# Patient Record
Sex: Female | Born: 1935
Health system: Southern US, Community
[De-identification: ages and names within clinical notes are randomized; demographics above are authoritative.]

## PROBLEM LIST (undated history)

## (undated) DIAGNOSIS — M109 Gout, unspecified: Secondary | ICD-10-CM

## (undated) DIAGNOSIS — N189 Chronic kidney disease, unspecified: Secondary | ICD-10-CM

## (undated) DIAGNOSIS — I639 Cerebral infarction, unspecified: Secondary | ICD-10-CM

## (undated) DIAGNOSIS — I700 Atherosclerosis of aorta: Secondary | ICD-10-CM

## (undated) DIAGNOSIS — M1991 Primary osteoarthritis, unspecified site: Secondary | ICD-10-CM

## (undated) DIAGNOSIS — I739 Peripheral vascular disease, unspecified: Secondary | ICD-10-CM

## (undated) DIAGNOSIS — K5792 Diverticulitis of intestine, part unspecified, without perforation or abscess without bleeding: Secondary | ICD-10-CM

## (undated) DIAGNOSIS — E119 Type 2 diabetes mellitus without complications: Secondary | ICD-10-CM

## (undated) DIAGNOSIS — K219 Gastro-esophageal reflux disease without esophagitis: Secondary | ICD-10-CM

## (undated) DIAGNOSIS — I10 Essential (primary) hypertension: Secondary | ICD-10-CM

## (undated) DIAGNOSIS — I6529 Occlusion and stenosis of unspecified carotid artery: Secondary | ICD-10-CM

## (undated) DIAGNOSIS — E039 Hypothyroidism, unspecified: Secondary | ICD-10-CM

## (undated) DIAGNOSIS — E785 Hyperlipidemia, unspecified: Secondary | ICD-10-CM

## (undated) DIAGNOSIS — I2510 Atherosclerotic heart disease of native coronary artery without angina pectoris: Secondary | ICD-10-CM

## (undated) HISTORY — DX: Gout, unspecified: M10.9

## (undated) HISTORY — DX: Hypothyroidism, unspecified: E03.9

## (undated) HISTORY — DX: Atherosclerosis of aorta: I70.0

## (undated) HISTORY — DX: Peripheral vascular disease, unspecified: I73.9

## (undated) HISTORY — DX: Primary osteoarthritis, unspecified site: M19.91

## (undated) HISTORY — PX: CATARACT EXTRACTION: SUR2

## (undated) HISTORY — DX: Occlusion and stenosis of unspecified carotid artery: I65.29

## (undated) HISTORY — DX: Atherosclerotic heart disease of native coronary artery without angina pectoris: I25.10

## (undated) HISTORY — DX: Type 2 diabetes mellitus without complications: E11.9

## (undated) HISTORY — DX: Cerebral infarction, unspecified: I63.9

## (undated) HISTORY — DX: Hyperlipidemia, unspecified: E78.5

## (undated) HISTORY — DX: Essential (primary) hypertension: I10

## (undated) HISTORY — PX: OTHER SURGICAL HISTORY: SHX169

## (undated) HISTORY — PX: COLONOSCOPY: SHX174

---

## 2004-08-18 ENCOUNTER — Ambulatory Visit: Payer: Self-pay | Admitting: Cardiovascular Disease

## 2004-08-22 ENCOUNTER — Ambulatory Visit: Payer: Self-pay | Admitting: Cardiovascular Disease

## 2004-09-06 ENCOUNTER — Ambulatory Visit: Payer: Self-pay | Admitting: Family Medicine

## 2004-09-21 ENCOUNTER — Inpatient Hospital Stay: Payer: Self-pay | Admitting: Internal Medicine

## 2004-12-04 ENCOUNTER — Ambulatory Visit: Payer: Self-pay | Admitting: Internal Medicine

## 2004-12-04 ENCOUNTER — Inpatient Hospital Stay: Payer: Self-pay | Admitting: Internal Medicine

## 2004-12-04 ENCOUNTER — Other Ambulatory Visit: Payer: Self-pay

## 2005-01-08 ENCOUNTER — Inpatient Hospital Stay: Payer: Self-pay | Admitting: Internal Medicine

## 2005-01-08 ENCOUNTER — Other Ambulatory Visit: Payer: Self-pay

## 2005-02-01 ENCOUNTER — Ambulatory Visit: Payer: Self-pay | Admitting: Oncology

## 2005-10-30 ENCOUNTER — Ambulatory Visit: Payer: Self-pay | Admitting: Oncology

## 2005-10-31 ENCOUNTER — Encounter (HOSPITAL_COMMUNITY): Admission: RE | Admit: 2005-10-31 | Discharge: 2006-01-29 | Payer: Self-pay | Admitting: Oncology

## 2005-10-31 LAB — CBC WITH DIFFERENTIAL (CANCER CENTER ONLY)
BASO#: 0 10*3/uL (ref 0.0–0.2)
BASO%: 0.7 % (ref 0.0–2.0)
EOS%: 3.7 % (ref 0.0–7.0)
Eosinophils Absolute: 0.2 10*3/uL (ref 0.0–0.5)
MCH: 32 pg (ref 26.0–34.0)
MCV: 95 fL (ref 81–101)
NEUT%: 39.5 % — ABNORMAL LOW (ref 39.6–80.0)
Platelets: 203 10*3/uL (ref 145–400)
RDW: 12.4 % (ref 10.5–14.6)

## 2005-10-31 LAB — MORPHOLOGY - CHCC SATELLITE

## 2005-10-31 LAB — CHCC SATELLITE - SMEAR

## 2005-11-01 LAB — FERRITIN: Ferritin: 80 ng/mL (ref 10–291)

## 2005-11-01 LAB — COMPREHENSIVE METABOLIC PANEL
ALT: 18 U/L (ref 0–40)
Albumin: 3.5 g/dL (ref 3.5–5.2)
Alkaline Phosphatase: 65 U/L (ref 39–117)
CO2: 25 mEq/L (ref 19–32)
Potassium: 4.2 mEq/L (ref 3.5–5.3)
Sodium: 140 mEq/L (ref 135–145)
Total Bilirubin: 0.6 mg/dL (ref 0.3–1.2)
Total Protein: 6.2 g/dL (ref 6.0–8.3)

## 2005-11-01 LAB — RETICULOCYTES (CHCC)
ABS Retic: 71.3 10*3/uL (ref 19.0–186.0)
Retic Ct Pct: 2.7 % (ref 0.4–3.1)

## 2005-11-01 LAB — IRON AND TIBC: %SAT: 86 % — ABNORMAL HIGH (ref 20–55)

## 2005-11-01 LAB — ERYTHROPOIETIN: Erythropoietin: 95.2 m[IU]/mL — ABNORMAL HIGH (ref 2.6–34.0)

## 2005-11-01 LAB — VITAMIN B12: Vitamin B-12: 1391 pg/mL — ABNORMAL HIGH (ref 211–911)

## 2005-11-05 LAB — TYPE & CROSSMATCH - CHCC SATELLITE

## 2005-11-12 ENCOUNTER — Ambulatory Visit: Payer: Self-pay | Admitting: Gastroenterology

## 2005-11-21 LAB — CBC WITH DIFFERENTIAL (CANCER CENTER ONLY)
BASO#: 0 10*3/uL (ref 0.0–0.2)
Eosinophils Absolute: 0.2 10*3/uL (ref 0.0–0.5)
HCT: 35.5 % (ref 34.8–46.6)
HGB: 11.9 g/dL (ref 11.6–15.9)
LYMPH#: 1.5 10*3/uL (ref 0.9–3.3)
MCH: 31.6 pg (ref 26.0–34.0)
MCHC: 33.6 g/dL (ref 32.0–36.0)
NEUT#: 1.6 10*3/uL (ref 1.5–6.5)
NEUT%: 44.7 % (ref 39.6–80.0)
RBC: 3.78 10*6/uL (ref 3.70–5.32)

## 2005-12-19 ENCOUNTER — Ambulatory Visit: Payer: Self-pay | Admitting: Oncology

## 2005-12-20 LAB — CBC WITH DIFFERENTIAL (CANCER CENTER ONLY)
BASO#: 0 10*3/uL (ref 0.0–0.2)
Eosinophils Absolute: 0.1 10*3/uL (ref 0.0–0.5)
HGB: 11.2 g/dL — ABNORMAL LOW (ref 11.6–15.9)
LYMPH%: 36.8 % (ref 14.0–48.0)
MCH: 30.9 pg (ref 26.0–34.0)
MCV: 93 fL (ref 81–101)
MONO%: 14.7 % — ABNORMAL HIGH (ref 0.0–13.0)
NEUT%: 44.6 % (ref 39.6–80.0)
RBC: 3.62 10*6/uL — ABNORMAL LOW (ref 3.70–5.32)

## 2006-02-18 ENCOUNTER — Ambulatory Visit: Payer: Self-pay | Admitting: Oncology

## 2006-09-15 ENCOUNTER — Emergency Department: Payer: Self-pay | Admitting: General Practice

## 2006-10-09 ENCOUNTER — Ambulatory Visit: Payer: Self-pay | Admitting: Oncology

## 2006-10-09 ENCOUNTER — Encounter (HOSPITAL_COMMUNITY): Admission: RE | Admit: 2006-10-09 | Discharge: 2006-10-09 | Payer: Self-pay | Admitting: Oncology

## 2006-10-09 LAB — COMPREHENSIVE METABOLIC PANEL
ALT: 23 U/L (ref 0–35)
AST: 30 U/L (ref 0–37)
Albumin: 3.3 g/dL — ABNORMAL LOW (ref 3.5–5.2)
Alkaline Phosphatase: 71 U/L (ref 39–117)
Calcium: 9.1 mg/dL (ref 8.4–10.5)
Chloride: 106 mEq/L (ref 96–112)
Potassium: 4.3 mEq/L (ref 3.5–5.3)
Sodium: 138 mEq/L (ref 135–145)

## 2006-10-09 LAB — CBC WITH DIFFERENTIAL (CANCER CENTER ONLY)
BASO#: 0 10*3/uL (ref 0.0–0.2)
EOS%: 2.7 % (ref 0.0–7.0)
Eosinophils Absolute: 0.1 10*3/uL (ref 0.0–0.5)
HGB: 8.6 g/dL — ABNORMAL LOW (ref 11.6–15.9)
LYMPH%: 31.3 % (ref 14.0–48.0)
MCH: 31.7 pg (ref 26.0–34.0)
MCHC: 33.8 g/dL (ref 32.0–36.0)
MCV: 94 fL (ref 81–101)
MONO%: 9.4 % (ref 0.0–13.0)
NEUT%: 56.3 % (ref 39.6–80.0)
RBC: 2.73 10*6/uL — ABNORMAL LOW (ref 3.70–5.32)

## 2006-10-09 LAB — RETICULOCYTES (CHCC)
ABS Retic: 87.7 10*3/uL (ref 19.0–186.0)
RBC.: 2.74 MIL/uL — ABNORMAL LOW (ref 3.87–5.11)

## 2006-10-09 LAB — IRON AND TIBC: TIBC: 366 ug/dL (ref 250–470)

## 2006-10-11 LAB — TYPE & CROSSMATCH - CHCC SATELLITE

## 2008-11-01 ENCOUNTER — Ambulatory Visit: Payer: Self-pay | Admitting: Oncology

## 2008-11-01 LAB — CBC WITH DIFFERENTIAL (CANCER CENTER ONLY)
BASO#: 0 10*3/uL (ref 0.0–0.2)
BASO%: 0.5 % (ref 0.0–2.0)
Eosinophils Absolute: 0.1 10*3/uL (ref 0.0–0.5)
HCT: 28.6 % — ABNORMAL LOW (ref 34.8–46.6)
HGB: 10 g/dL — ABNORMAL LOW (ref 11.6–15.9)
LYMPH#: 1.2 10*3/uL (ref 0.9–3.3)
LYMPH%: 33.5 % (ref 14.0–48.0)
MCV: 95 fL (ref 81–101)
MONO#: 0.5 10*3/uL (ref 0.1–0.9)
NEUT%: 49.5 % (ref 39.6–80.0)
RBC: 3.01 10*6/uL — ABNORMAL LOW (ref 3.70–5.32)
RDW: 12.6 % (ref 10.5–14.6)
WBC: 3.6 10*3/uL — ABNORMAL LOW (ref 3.9–10.0)

## 2008-11-01 LAB — MORPHOLOGY - CHCC SATELLITE
PLT EST ~~LOC~~: ADEQUATE
Platelet Morphology: NORMAL

## 2008-11-01 LAB — CMP (CANCER CENTER ONLY)
ALT(SGPT): 46 U/L (ref 10–47)
AST: 78 U/L — ABNORMAL HIGH (ref 11–38)
CO2: 27 mEq/L (ref 18–33)
Creat: 0.9 mg/dl (ref 0.6–1.2)
Total Bilirubin: 0.7 mg/dl (ref 0.20–1.60)

## 2008-11-03 LAB — PROTEIN ELECTROPHORESIS, SERUM
Albumin ELP: 56.6 % (ref 55.8–66.1)
Alpha-1-Globulin: 4 % (ref 2.9–4.9)
Beta 2: 5.7 % (ref 3.2–6.5)
Total Protein, Serum Electrophoresis: 6.9 g/dL (ref 6.0–8.3)

## 2008-11-03 LAB — IRON AND TIBC
%SAT: 53 % (ref 20–55)
TIBC: 395 ug/dL (ref 250–470)

## 2008-11-03 LAB — ERYTHROPOIETIN: Erythropoietin: 50.4 m[IU]/mL — ABNORMAL HIGH (ref 2.6–34.0)

## 2008-11-03 LAB — FERRITIN: Ferritin: 119 ng/mL (ref 10–291)

## 2008-11-03 LAB — VITAMIN B12: Vitamin B-12: 776 pg/mL (ref 211–911)

## 2008-12-08 ENCOUNTER — Ambulatory Visit: Payer: Self-pay | Admitting: Oncology

## 2008-12-13 LAB — CBC WITH DIFFERENTIAL (CANCER CENTER ONLY)
BASO%: 0.5 % (ref 0.0–2.0)
Eosinophils Absolute: 0.2 10*3/uL (ref 0.0–0.5)
LYMPH#: 1.5 10*3/uL (ref 0.9–3.3)
MCV: 94 fL (ref 81–101)
MONO#: 0.6 10*3/uL (ref 0.1–0.9)
NEUT#: 1.7 10*3/uL (ref 1.5–6.5)
Platelets: 175 10*3/uL (ref 145–400)
RBC: 3.78 10*6/uL (ref 3.70–5.32)
RDW: 12 % (ref 10.5–14.6)
WBC: 4 10*3/uL (ref 3.9–10.0)

## 2009-02-12 ENCOUNTER — Emergency Department: Payer: Self-pay | Admitting: Emergency Medicine

## 2009-03-14 ENCOUNTER — Ambulatory Visit: Payer: Self-pay | Admitting: Oncology

## 2009-03-15 LAB — CBC WITH DIFFERENTIAL (CANCER CENTER ONLY)
BASO%: 0.5 % (ref 0.0–2.0)
Eosinophils Absolute: 0.2 10*3/uL (ref 0.0–0.5)
LYMPH%: 25.2 % (ref 14.0–48.0)
MCH: 31.5 pg (ref 26.0–34.0)
MCV: 94 fL (ref 81–101)
MONO#: 0.6 10*3/uL (ref 0.1–0.9)
MONO%: 13.8 % — ABNORMAL HIGH (ref 0.0–13.0)
NEUT#: 2.4 10*3/uL (ref 1.5–6.5)
Platelets: 198 10*3/uL (ref 145–400)
RBC: 4.13 10*6/uL (ref 3.70–5.32)
RDW: 12 % (ref 10.5–14.6)
WBC: 4.3 10*3/uL (ref 3.9–10.0)

## 2009-03-15 LAB — CMP (CANCER CENTER ONLY)
ALT(SGPT): 25 U/L (ref 10–47)
AST: 39 U/L — ABNORMAL HIGH (ref 11–38)
Albumin: 3.5 g/dL (ref 3.3–5.5)
Calcium: 9.8 mg/dL (ref 8.0–10.3)
Chloride: 101 mEq/L (ref 98–108)
Potassium: 4.2 mEq/L (ref 3.3–4.7)
Total Protein: 7.6 g/dL (ref 6.4–8.1)

## 2009-03-29 ENCOUNTER — Ambulatory Visit: Payer: Self-pay | Admitting: Ophthalmology

## 2009-03-29 ENCOUNTER — Ambulatory Visit: Payer: Self-pay | Admitting: Cardiology

## 2009-04-08 ENCOUNTER — Ambulatory Visit: Payer: Self-pay | Admitting: Ophthalmology

## 2009-06-10 ENCOUNTER — Ambulatory Visit: Payer: Self-pay | Admitting: Oncology

## 2009-06-15 LAB — CBC WITH DIFFERENTIAL (CANCER CENTER ONLY)
Eosinophils Absolute: 0.1 10*3/uL (ref 0.0–0.5)
HCT: 42.5 % (ref 34.8–46.6)
LYMPH%: 42.6 % (ref 14.0–48.0)
MCV: 96 fL (ref 81–101)
MONO#: 0.5 10*3/uL (ref 0.1–0.9)
NEUT%: 42 % (ref 39.6–80.0)
RDW: 11.4 % (ref 10.5–14.6)
WBC: 4.3 10*3/uL (ref 3.9–10.0)

## 2009-06-15 LAB — IRON AND TIBC
%SAT: 40 % (ref 20–55)
TIBC: 356 ug/dL (ref 250–470)

## 2010-04-27 ENCOUNTER — Ambulatory Visit: Payer: Self-pay | Admitting: Ophthalmology

## 2010-05-02 ENCOUNTER — Ambulatory Visit: Payer: Self-pay | Admitting: Ophthalmology

## 2011-11-04 ENCOUNTER — Emergency Department: Payer: Self-pay | Admitting: Emergency Medicine

## 2013-07-30 ENCOUNTER — Encounter: Payer: Self-pay | Admitting: Podiatry

## 2013-07-31 ENCOUNTER — Encounter: Payer: Self-pay | Admitting: Podiatry

## 2013-07-31 ENCOUNTER — Ambulatory Visit (INDEPENDENT_AMBULATORY_CARE_PROVIDER_SITE_OTHER): Payer: Medicare Other | Admitting: Podiatry

## 2013-07-31 ENCOUNTER — Ambulatory Visit (INDEPENDENT_AMBULATORY_CARE_PROVIDER_SITE_OTHER): Payer: Medicare Other

## 2013-07-31 VITALS — BP 148/75 | HR 74 | Resp 16 | Ht 60.0 in | Wt 230.0 lb

## 2013-07-31 DIAGNOSIS — M79673 Pain in unspecified foot: Secondary | ICD-10-CM

## 2013-07-31 DIAGNOSIS — M79609 Pain in unspecified limb: Secondary | ICD-10-CM

## 2013-07-31 DIAGNOSIS — M779 Enthesopathy, unspecified: Secondary | ICD-10-CM

## 2013-07-31 DIAGNOSIS — M21549 Acquired clubfoot, unspecified foot: Secondary | ICD-10-CM

## 2013-07-31 DIAGNOSIS — Q828 Other specified congenital malformations of skin: Secondary | ICD-10-CM

## 2013-07-31 MED ORDER — TRIAMCINOLONE ACETONIDE 10 MG/ML IJ SUSP
10.0000 mg | Freq: Once | INTRAMUSCULAR | Status: AC
  Administered 2013-07-31: 10 mg

## 2013-07-31 NOTE — Progress Notes (Signed)
   Subjective:    Patient ID: Ashley [name], female    DOB: Sep 27, 1918, 80 y.o.   MRN: [id number]  HPI    Review of Systems  HENT: Positive for hearing loss.   Neurological: Positive for light-headedness.  All other systems reviewed and are negative.       Objective:   Physical Exam        Assessment & Plan:

## 2013-07-31 NOTE — Progress Notes (Signed)
Subjective:     Patient ID: Ashley [name], female   DOB: [birth year], 80 y.o.   MRN: [id number]  HPI patient presents stating the outside of my left foot has really been hurting there is fluid buildup in a lesion making it hard for me to walk   Review of Systems     Objective:   Physical Exam Neurovascular status unchanged patient is well oriented x3 with muscle strength adequate mild range of motion loss and equinus condition. Exquisite discomfort fifth metatarsal head with plantarflexed bone and inflammation with fluid buildup around the area    Assessment:     Plantarflexed metatarsal with capsulitis left fifth MPJ    Plan:     H&P and x-ray reviewed and today did careful injection left fifth MPJ 3 mg dexamethasone Kenalog combination 5 mg Xylocaine and debrided lesion. Reappoint as needed

## 2013-08-18 ENCOUNTER — Emergency Department: Payer: Self-pay | Admitting: Emergency Medicine

## 2014-08-29 ENCOUNTER — Emergency Department: Admit: 2014-08-29 | Disposition: A | Discharge status: Home / Self Care | Payer: Self-pay | Admitting: Student

## 2014-08-29 LAB — CBC WITH DIFFERENTIAL/PLATELET
BASOS ABS: 0.1 10*3/uL (ref 0.0–0.1)
Basophil %: 1.3 %
Eosinophil #: 0.1 10*3/uL (ref 0.0–0.7)
Eosinophil %: 2.2 %
HCT: 40.4 % (ref 35.0–47.0)
HGB: 13.3 g/dL (ref 12.0–16.0)
LYMPHS PCT: 28.3 %
Lymphocyte #: 1.6 10*3/uL (ref 1.0–3.6)
MCH: 31.5 pg (ref 26.0–34.0)
MCHC: 33 g/dL (ref 32.0–36.0)
MCV: 96 fL (ref 80–100)
MONOS PCT: 9.9 %
Monocyte #: 0.5 x10 3/mm (ref 0.2–0.9)
NEUTROS ABS: 3.2 10*3/uL (ref 1.4–6.5)
Neutrophil %: 58.3 %
Platelet: 169 10*3/uL (ref 150–440)
RBC: 4.22 10*6/uL (ref 3.80–5.20)
RDW: 13.5 % (ref 11.5–14.5)
WBC: 5.5 10*3/uL (ref 3.6–11.0)

## 2014-08-29 LAB — BASIC METABOLIC PANEL
ANION GAP: 8 (ref 7–16)
BUN: 22 mg/dL — ABNORMAL HIGH
CHLORIDE: 107 mmol/L
CO2: 24 mmol/L
Calcium, Total: 9.8 mg/dL
Creatinine: 0.95 mg/dL
EGFR (African American): 60 — ABNORMAL LOW
EGFR (Non-African Amer.): 52 — ABNORMAL LOW
Glucose: 118 mg/dL — ABNORMAL HIGH
Potassium: 4.4 mmol/L
Sodium: 139 mmol/L

## 2014-08-29 LAB — URINALYSIS, COMPLETE
BLOOD: NEGATIVE
Bilirubin,UR: NEGATIVE
Glucose,UR: NEGATIVE mg/dL (ref 0–75)
KETONE: NEGATIVE
NITRITE: POSITIVE
PROTEIN: NEGATIVE
Ph: 5 (ref 4.5–8.0)
RBC,UR: <1 /HPF (ref 0–5)
SPECIFIC GRAVITY: 1.006 (ref 1.003–1.030)
Squamous Epithelial: <1
WBC UR: 16 /HPF (ref 0–5)

## 2014-08-29 LAB — HEPATIC FUNCTION PANEL A (ARMC)
Albumin: 3.9 g/dL
Alkaline Phosphatase: 74 U/L
BILIRUBIN DIRECT: 0.1 mg/dL
BILIRUBIN INDIRECT: 0.6
BILIRUBIN TOTAL: 0.7 mg/dL
SGOT(AST): 21 U/L
SGPT (ALT): 16 U/L
TOTAL PROTEIN: 7.2 g/dL

## 2014-08-29 LAB — TROPONIN I: Troponin-I: <0.03 ng/mL

## 2014-08-31 LAB — URINE CULTURE

## 2015-01-23 ENCOUNTER — Emergency Department: Payer: Medicare Other

## 2015-01-23 ENCOUNTER — Inpatient Hospital Stay
Admission: EM | Admit: 2015-01-23 | Discharge: 2015-01-29 | DRG: 378 | Disposition: A | Discharge status: Skilled Nursing Facility | Payer: Medicare Other | Attending: Internal Medicine | Admitting: Internal Medicine

## 2015-01-23 ENCOUNTER — Encounter: Payer: Self-pay | Admitting: Emergency Medicine

## 2015-01-23 DIAGNOSIS — Z85828 Personal history of other malignant neoplasm of skin: Secondary | ICD-10-CM | POA: Diagnosis not present

## 2015-01-23 DIAGNOSIS — K625 Hemorrhage of anus and rectum: Secondary | ICD-10-CM

## 2015-01-23 DIAGNOSIS — Z66 Do not resuscitate: Secondary | ICD-10-CM | POA: Diagnosis present

## 2015-01-23 DIAGNOSIS — M109 Gout, unspecified: Secondary | ICD-10-CM | POA: Diagnosis present

## 2015-01-23 DIAGNOSIS — K5731 Diverticulosis of large intestine without perforation or abscess with bleeding: Secondary | ICD-10-CM | POA: Diagnosis present

## 2015-01-23 DIAGNOSIS — I517 Cardiomegaly: Secondary | ICD-10-CM | POA: Diagnosis present

## 2015-01-23 DIAGNOSIS — K5733 Diverticulitis of large intestine without perforation or abscess with bleeding: Secondary | ICD-10-CM | POA: Diagnosis present

## 2015-01-23 DIAGNOSIS — H9193 Unspecified hearing loss, bilateral: Secondary | ICD-10-CM | POA: Diagnosis not present

## 2015-01-23 DIAGNOSIS — M1990 Unspecified osteoarthritis, unspecified site: Secondary | ICD-10-CM | POA: Diagnosis present

## 2015-01-23 DIAGNOSIS — E039 Hypothyroidism, unspecified: Secondary | ICD-10-CM | POA: Diagnosis present

## 2015-01-23 DIAGNOSIS — Z8719 Personal history of other diseases of the digestive system: Secondary | ICD-10-CM | POA: Diagnosis present

## 2015-01-23 DIAGNOSIS — I10 Essential (primary) hypertension: Secondary | ICD-10-CM | POA: Diagnosis not present

## 2015-01-23 DIAGNOSIS — D649 Anemia, unspecified: Secondary | ICD-10-CM | POA: Diagnosis not present

## 2015-01-23 DIAGNOSIS — N179 Acute kidney failure, unspecified: Secondary | ICD-10-CM | POA: Diagnosis present

## 2015-01-23 DIAGNOSIS — K922 Gastrointestinal hemorrhage, unspecified: Secondary | ICD-10-CM

## 2015-01-23 HISTORY — DX: Diverticulitis of intestine, part unspecified, without perforation or abscess without bleeding: K57.92

## 2015-01-23 LAB — PROTIME-INR
INR: 1.12
Prothrombin Time: 14.6 seconds (ref 11.4–15.0)

## 2015-01-23 LAB — COMPREHENSIVE METABOLIC PANEL
ALT: 15 U/L (ref 14–54)
ANION GAP: 10 (ref 5–15)
AST: 32 U/L (ref 15–41)
Albumin: 3.9 g/dL (ref 3.5–5.0)
Alkaline Phosphatase: 87 U/L (ref 38–126)
BUN: 30 mg/dL — AB (ref 6–20)
CHLORIDE: 105 mmol/L (ref 101–111)
CO2: 25 mmol/L (ref 22–32)
Calcium: 9.7 mg/dL (ref 8.9–10.3)
Creatinine, Ser: 1.13 mg/dL — ABNORMAL HIGH (ref 0.44–1.00)
GFR calc Af Amer: 47 mL/min — ABNORMAL LOW (ref >60)
GFR, EST NON AFRICAN AMERICAN: 41 mL/min — AB (ref >60)
Glucose, Bld: 133 mg/dL — ABNORMAL HIGH (ref 65–99)
Potassium: 4.1 mmol/L (ref 3.5–5.1)
Sodium: 140 mmol/L (ref 135–145)
TOTAL PROTEIN: 7.5 g/dL (ref 6.5–8.1)
Total Bilirubin: 0.6 mg/dL (ref 0.3–1.2)

## 2015-01-23 LAB — CBC WITH DIFFERENTIAL/PLATELET
BASOS ABS: 0.2 10*3/uL — AB (ref 0–0.1)
Basophils Relative: 3 %
EOS PCT: 4 %
Eosinophils Absolute: 0.2 10*3/uL (ref 0–0.7)
HEMATOCRIT: 39.2 % (ref 35.0–47.0)
Hemoglobin: 12.9 g/dL (ref 12.0–16.0)
LYMPHS ABS: 1.6 10*3/uL (ref 1.0–3.6)
Lymphocytes Relative: 29 %
MCH: 30.8 pg (ref 26.0–34.0)
MCHC: 33 g/dL (ref 32.0–36.0)
MCV: 93.3 fL (ref 80.0–100.0)
MONO ABS: 0.6 10*3/uL (ref 0.2–0.9)
Monocytes Relative: 11 %
Neutro Abs: 2.9 10*3/uL (ref 1.4–6.5)
Neutrophils Relative %: 53 %
PLATELETS: 204 10*3/uL (ref 150–440)
RBC: 4.2 MIL/uL (ref 3.80–5.20)
RDW: 13.2 % (ref 11.5–14.5)
WBC: 5.5 10*3/uL (ref 3.6–11.0)

## 2015-01-23 LAB — ABO/RH: ABO/RH(D): B POS

## 2015-01-23 LAB — HEMOGLOBIN: HEMOGLOBIN: 11.1 g/dL — AB (ref 12.0–16.0)

## 2015-01-23 MED ORDER — ACETAMINOPHEN 650 MG RE SUPP: 650.0000 mg | Freq: Four times a day (QID) | RECTAL | Status: DC | PRN

## 2015-01-23 MED ORDER — FISH OIL + D3 1200-1000 MG-UNIT PO CAPS: ORAL_CAPSULE | Freq: Every day | ORAL | Status: DC

## 2015-01-23 MED ORDER — LEVOTHYROXINE SODIUM 100 MCG PO TABS
100.0000 ug | ORAL_TABLET | Freq: Every day | ORAL | Status: DC
  Administered 2015-01-25: 100 ug via ORAL
  Filled 2015-01-23: qty 1

## 2015-01-23 MED ORDER — OMEGA-3-ACID ETHYL ESTERS 1 G PO CAPS
1.0000 g | ORAL_CAPSULE | Freq: Every day | ORAL | Status: DC
  Administered 2015-01-25 – 2015-01-29 (×5): 1 g via ORAL
  Filled 2015-01-23 (×5): qty 1

## 2015-01-23 MED ORDER — FENOFIBRATE 54 MG PO TABS
54.0000 mg | ORAL_TABLET | Freq: Every day | ORAL | Status: DC
  Administered 2015-01-25 – 2015-01-29 (×5): 54 mg via ORAL
  Filled 2015-01-23 (×6): qty 1

## 2015-01-23 MED ORDER — CALCIUM CARBONATE-VITAMIN D 500-200 MG-UNIT PO TABS
1.0000 | ORAL_TABLET | Freq: Every day | ORAL | Status: DC
  Administered 2015-01-25 – 2015-01-29 (×5): 1 via ORAL
  Filled 2015-01-23 (×5): qty 1

## 2015-01-23 MED ORDER — LOSARTAN POTASSIUM 50 MG PO TABS
100.0000 mg | ORAL_TABLET | Freq: Every day | ORAL | Status: DC
  Administered 2015-01-25 – 2015-01-29 (×5): 100 mg via ORAL
  Filled 2015-01-23 (×5): qty 2

## 2015-01-23 MED ORDER — IOHEXOL 240 MG/ML SOLN
25.0000 mL | Freq: Once | INTRAMUSCULAR | Status: AC | PRN
  Administered 2015-01-23: 25 mL via ORAL

## 2015-01-23 MED ORDER — PRAVASTATIN SODIUM 20 MG PO TABS
40.0000 mg | ORAL_TABLET | Freq: Every day | ORAL | Status: DC
  Administered 2015-01-25 – 2015-01-29 (×5): 40 mg via ORAL
  Filled 2015-01-23 (×5): qty 2

## 2015-01-23 MED ORDER — ACETAMINOPHEN 325 MG PO TABS: 650.0000 mg | ORAL_TABLET | Freq: Four times a day (QID) | ORAL | Status: DC | PRN

## 2015-01-23 MED ORDER — IOHEXOL 300 MG/ML  SOLN
75.0000 mL | Freq: Once | INTRAMUSCULAR | Status: AC | PRN
  Administered 2015-01-23: 75 mL via INTRAVENOUS

## 2015-01-23 MED ORDER — SODIUM CHLORIDE 0.9 % IV SOLN
INTRAVENOUS | Status: DC
  Administered 2015-01-23 – 2015-01-25 (×3): via INTRAVENOUS

## 2015-01-23 MED ORDER — FEBUXOSTAT 40 MG PO TABS
ORAL_TABLET | Freq: Every day | ORAL | Status: DC
  Filled 2015-01-23: qty 2

## 2015-01-23 MED ORDER — CALCIUM CARB-CHOLECALCIFEROL 600-800 MG-UNIT PO TABS: ORAL_TABLET | Freq: Every day | ORAL | Status: DC

## 2015-01-23 MED ORDER — ALLOPURINOL 100 MG PO TABS
100.0000 mg | ORAL_TABLET | Freq: Every day | ORAL | Status: DC
  Filled 2015-01-23: qty 1

## 2015-01-23 MED ORDER — VITAMIN D 1000 UNITS PO TABS
1000.0000 [IU] | ORAL_TABLET | Freq: Every day | ORAL | Status: DC
  Administered 2015-01-25 – 2015-01-29 (×5): 1000 [IU] via ORAL
  Filled 2015-01-23 (×5): qty 1

## 2015-01-23 NOTE — ED Provider Notes (Signed)
[facility] Emergency Department Provider Note     Time seen: ----------------------------------------- 5:52 PM on 01/23/2015 -----------------------------------------    I have reviewed the triage vital signs and the nursing notes.   HISTORY  Chief Complaint GI Bleeding    HPI Ashley [name] is a 81 y.o. female who presents ER for bright red blood per rectum today. Patient states after eating dinner she felt like she had to go the bathroom, then noted bright red blood rectally. She was not having any pain, denies fevers chills or other complaints. She has not had this happen to her before. Patient does not take any anticoagulants.   Past Medical History  Diagnosis Date  . Gout   . Hypertension   . Hypothyroid   . Osteoarthritis     There are no active problems to display for this patient.   Past Surgical History  Procedure Laterality Date  . Cataract extraction    . Finger crystal removal      right 3rd  . Toe crystal removal      right 1st toe    Allergies Review of patient's allergies indicates no known allergies.  Social History Social History  Substance Use Topics  . Smoking status: Never Smoker   . Smokeless tobacco: Not on file  . Alcohol Use: No    Review of Systems Constitutional: Negative for fever. Eyes: Negative for visual changes. ENT: Negative for sore throat. Cardiovascular: Negative for chest pain. Respiratory: Negative for shortness of breath. Gastrointestinal: Positive for rectal bleeding, negative for pain Genitourinary: Negative for dysuria. Musculoskeletal: Negative for back pain. Skin: Negative for rash. Neurological: Negative for headaches, focal weakness or numbness.  10-point ROS otherwise negative.  ____________________________________________   PHYSICAL EXAM:  VITAL SIGNS: ED Triage Vitals  Enc Vitals Group     BP --      Pulse --      Resp --      Temp --      Temp src --      SpO2 --       Weight --      Height --      Head Cir --      Peak Flow --      Pain Score --      Pain Loc --      Pain Edu? --      Excl. in [place]? --     Constitutional: Alert and oriented. Well appearing and in no distress. Eyes: Conjunctivae are normal. PERRL. Normal extraocular movements. ENT   Head: Normocephalic and atraumatic.   Nose: No congestion/rhinnorhea.   Mouth/Throat: Mucous membranes are moist.   Neck: No stridor. Cardiovascular: Normal rate, regular rhythm. Normal and symmetric distal pulses are present in all extremities. No murmurs, rubs, or gallops. Respiratory: Normal respiratory effort without tachypnea nor retractions. Breath sounds are clear and equal bilaterally. No wheezes/rales/rhonchi. Gastrointestinal: Soft and nontender. No distention. No abdominal bruits.  Rectal: There is copious bright red blood per rectum. Him and his nontender, no hemorrhoids Musculoskeletal: Nontender with normal range of motion in all extremities. No joint effusions.  No lower extremity tenderness nor edema. Neurologic:  Normal speech and language. No gross focal neurologic deficits are appreciated. Speech is normal. No gait instability. Skin:  Skin is warm, dry and intact. No rash noted. Psychiatric: Mood and affect are normal. Speech and behavior are normal. Patient exhibits appropriate insight and judgment.  ____________________________________________  ED COURSE:  Pertinent labs & imaging  results that were available during my care of the patient were reviewed by me and considered in my medical decision making (see chart for details). Patient with bright red blood per rectum, will check labs and monitor. Patient will likely need admission and GI consultation ____________________________________________    LABS (pertinent positives/negatives)  Labs Reviewed  CBC WITH DIFFERENTIAL/PLATELET - Abnormal; Notable for the following:    Basophils Absolute 0.2 (*)    All other  components within normal limits  COMPREHENSIVE METABOLIC PANEL - Abnormal; Notable for the following:    Glucose, Bld 133 (*)    BUN 30 (*)    Creatinine, Ser 1.13 (*)    GFR calc non Af Amer 41 (*)    GFR calc Af Amer 47 (*)    All other components within normal limits  PROTIME-INR  URINALYSIS COMPLETEWITH MICROSCOPIC ([facility] ONLY)  TYPE AND SCREEN    RADIOLOGY Images were viewed by me  CT abdomen and pelvis IMPRESSION: No acute findings in the abdomen/ pelvis.  Moderate diverticulosis of the sigmoid colon without active inflammation.  Sub cm right renal cortical hypodensity too small to characterize, but likely a cyst.  Mild cardiomegaly. Subtle peripheral increased interstitial markings over the lung bases.  Moderate degenerative changes spine with multilevel disc disease over the lumbar spine. ____________________________________________  FINAL ASSESSMENT AND PLAN  Bright red blood per rectum  Plan: Patient with labs and imaging as dictated above. Likely diverticulosis related, patient will need to be observed in the hospital, have serial H&H and be evaluated by GI. Patient currently stable.   Earleen Newport, MD   Earleen Newport, MD 01/23/15 2005

## 2015-01-23 NOTE — H&P (Signed)
Ashley [name] is an 81 y.o. female.   Chief Complaint: Blood during BM HPI: States she had BM today with large amount of blood. No diarrhea or abdominal pain associated. Had 1 additional bloody BM in the ED. No history of this before. No recent ASA or NSAID use. No other complaints.  Past Medical History  Diagnosis Date  . Gout   . Hypertension   . Hypothyroid   . Osteoarthritis   . Diverticulitis   . Cancer skin    Past Surgical History  Procedure Laterality Date  . Cataract extraction    . Finger crystal removal      right 3rd  . Toe crystal removal      right 1st toe    No family history on file.  Positive for CAD Social History:  reports that she has never smoked. She does not have any smokeless tobacco history on file. She reports that she does not drink alcohol. Her drug history is not on file.  Allergies: No Known Allergies   (Not in a hospital admission)  Results for orders placed or performed during the hospital encounter of 01/23/15 (from the past 48 hour(s))  CBC with Differential/Platelet     Status: Abnormal   Collection Time: 01/23/15  6:21 PM  Result Value Ref Range   WBC 5.5 3.6 - 11.0 K/uL   RBC 4.20 3.80 - 5.20 MIL/uL   Hemoglobin 12.9 12.0 - 16.0 g/dL   HCT 39.2 35.0 - 47.0 %   MCV 93.3 80.0 - 100.0 fL   MCH 30.8 26.0 - 34.0 pg   MCHC 33.0 32.0 - 36.0 g/dL   RDW 13.2 11.5 - 14.5 %   Platelets 204 150 - 440 K/uL   Neutrophils Relative % 53 %   Neutro Abs 2.9 1.4 - 6.5 K/uL   Lymphocytes Relative 29 %   Lymphs Abs 1.6 1.0 - 3.6 K/uL   Monocytes Relative 11 %   Monocytes Absolute 0.6 0.2 - 0.9 K/uL   Eosinophils Relative 4 %   Eosinophils Absolute 0.2 0 - 0.7 K/uL   Basophils Relative 3 %   Basophils Absolute 0.2 (H) 0 - 0.1 K/uL  Comprehensive metabolic panel     Status: Abnormal   Collection Time: 01/23/15  6:21 PM  Result Value Ref Range   Sodium 140 135 - 145 mmol/L   Potassium 4.1 3.5 - 5.1 mmol/L   Chloride 105 101 - 111 mmol/L   CO2  25 22 - 32 mmol/L   Glucose, Bld 133 (H) 65 - 99 mg/dL   BUN 30 (H) 6 - 20 mg/dL   Creatinine, Ser 1.13 (H) 0.44 - 1.00 mg/dL   Calcium 9.7 8.9 - 10.3 mg/dL   Total Protein 7.5 6.5 - 8.1 g/dL   Albumin 3.9 3.5 - 5.0 g/dL   AST 32 15 - 41 U/L   ALT 15 14 - 54 U/L   Alkaline Phosphatase 87 38 - 126 U/L   Total Bilirubin 0.6 0.3 - 1.2 mg/dL   GFR calc non Af Amer 41 (L) >60 mL/min   GFR calc Af Amer 47 (L) >60 mL/min    Comment: (NOTE) The eGFR has been calculated using the CKD EPI equation. This calculation has not been validated in all clinical situations. eGFR's persistently <60 mL/min signify possible Chronic Kidney Disease.    Anion gap 10 5 - 15  Protime-INR     Status: None   Collection Time: 01/23/15  6:21 PM  Result  Value Ref Range   Prothrombin Time 14.6 11.4 - 15.0 seconds   INR 1.12   Type and screen for Red Blood Exchange     Status: None   Collection Time: 01/23/15  6:21 PM  Result Value Ref Range   ABO/RH(D) B POS    Antibody Screen NEG    Sample Expiration 01/26/2015    Ct Abdomen Pelvis W Contrast  01/23/2015   CLINICAL DATA:  Rectal bleeding beginning today.  EXAM: CT ABDOMEN AND PELVIS WITH CONTRAST  TECHNIQUE: Multidetector CT imaging of the abdomen and pelvis was performed using the standard protocol following bolus administration of intravenous contrast.  CONTRAST:  72m OMNIPAQUE IOHEXOL 240 MG/ML SOLN, 774mOMNIPAQUE IOHEXOL 300 MG/ML SOLN  COMPARISON:  None.  FINDINGS: Lung bases demonstrate subtle peripheral increased interstitial markings no consolidation or effusion. Borderline cardiomegaly.  Abdominal images demonstrate subtle nodular contour to the liver. Small calcified granuloma over the right lobe of the liver. The spleen, pancreas, gallbladder and adrenal glands are within normal. The appendix is not visualized.  Kidneys normal in size with no hydronephrosis or nephrolithiasis. There is a sub cm hypodensity over the upper pole of the right renal cortex  too small to characterize but likely a cyst. Ureters are within normal.  There is mild-to-moderate calcified plaque over the abdominal aorta and iliac arteries.  Mesentery is within normal.  Small bowel is unremarkable.  There is diverticulosis of the colon most prominent over the sigmoid colon without active inflammation.  Pelvic images demonstrate the bladder, uterus and rectum to be within normal. Adnexal regions are unremarkable.  There are moderate degenerative changes of the spine with multilevel disc disease over the lumbar spine. There are mild degenerative changes of the hips  IMPRESSION: No acute findings in the abdomen/ pelvis.  Moderate diverticulosis of the sigmoid colon without active inflammation.  Sub cm right renal cortical hypodensity too small to characterize, but likely a cyst.  Mild cardiomegaly. Subtle peripheral increased interstitial markings over the lung bases.  Moderate degenerative changes spine with multilevel disc disease over the lumbar spine.   Electronically Signed   By: DaMarin Olp.D.   On: 01/23/2015 20:01    Review of Systems  Constitutional: Negative for fever and chills.  HENT: Positive for hearing loss.   Eyes: Negative for blurred vision.  Respiratory: Negative for shortness of breath.   Cardiovascular: Negative for chest pain and palpitations.  Gastrointestinal: Positive for blood in stool. Negative for nausea, vomiting and diarrhea.  Genitourinary: Negative for dysuria.  Musculoskeletal: Positive for back pain.  Skin: Negative for rash.  Neurological: Negative for dizziness and sensory change.    Blood pressure 165/88, pulse 67, temperature 98.1 F (36.7 C), temperature source Oral, resp. rate 18, height 5' 6" (1.676 m), weight 58.06 kg (128 lb), SpO2 100 %. Physical Exam  Constitutional: She is oriented to person, place, and time. She appears well-developed and well-nourished. No distress.  HENT:  Head: Normocephalic.  Mouth/Throat: Oropharynx is  clear and moist. No oropharyngeal exudate.  Eyes: EOM are normal. Pupils are equal, round, and reactive to light. No scleral icterus.  Neck: Neck supple. No JVD present. No tracheal deviation present. No thyromegaly present.  Cardiovascular: Normal rate.   No murmur heard. Respiratory:  Clear to ascultation. No use of accessary muscles.  GI: Soft. Bowel sounds are normal. She exhibits no distension and no mass. There is tenderness.  Musculoskeletal: Normal range of motion. She exhibits no edema or tenderness.  Lymphadenopathy:  She has no cervical adenopathy.  Neurological: She is alert and oriented to person, place, and time. No cranial nerve deficit.  Skin: Skin is warm and dry. No rash noted. No erythema.     Assessment/Plan 1. GI Bleed: Suspect diverticular bleed. Will check serial H/H, give IVF. Consult GI.  2. Acute Renal Failure: Suspect secondary from volume loss from blood. IVF recheck renal function in am.  3. HTN: Controlled with current medications.  4. Hypothyroidism: Continue Synthroid.  Reviewed past medical records. Discussed case with Dr Jimmye Norman.  Time spent= 45 min  [name] 01/23/2015, 8:38 PM

## 2015-01-23 NOTE — ED Notes (Signed)
Assisted patient up to bathroom to attempt to get urine specimen, bright red blood noticed in commode and blood clots noticed in pad. Patient denies dizziness, lightheaded, or weakness. Patient repeatedly states "I feel fine." Assisted patient back to bed.

## 2015-01-23 NOTE — ED Notes (Signed)
hospitalist at bedside

## 2015-01-23 NOTE — ED Notes (Signed)
Patient present to ED via EMS from [facility] with c/o GI bleed this evening around 4pm. Patient reports had bowel movement this evening with bright red blood in stool. Denies chest pain, dizziness, shortness of breath, or lightheadedness. Patient alert and oriented x 4, respirations even and unlabored.

## 2015-01-24 ENCOUNTER — Other Ambulatory Visit: Payer: Medicare Other

## 2015-01-24 ENCOUNTER — Inpatient Hospital Stay: Payer: Medicare Other

## 2015-01-24 LAB — IRON AND TIBC
Iron: 116 ug/dL (ref 28–170)
SATURATION RATIOS: 38 % — AB (ref 10.4–31.8)
TIBC: 306 ug/dL (ref 250–450)
UIBC: 191 ug/dL

## 2015-01-24 LAB — HEMOGLOBIN
Hemoglobin: 9.5 g/dL — ABNORMAL LOW (ref 12.0–16.0)
Hemoglobin: 9.6 g/dL — ABNORMAL LOW (ref 12.0–16.0)
Hemoglobin: 9.5 g/dL — ABNORMAL LOW (ref 12.0–16.0)
Hemoglobin: 9.1 g/dL — ABNORMAL LOW (ref 12.0–16.0)

## 2015-01-24 LAB — TYPE AND SCREEN
ABO/RH(D): B POS
ANTIBODY SCREEN: NEGATIVE

## 2015-01-24 MED ORDER — HYDRALAZINE HCL 20 MG/ML IJ SOLN: 10.0000 mg | Freq: Four times a day (QID) | INTRAMUSCULAR | Status: DC | PRN

## 2015-01-24 MED ORDER — TECHNETIUM TC 99M-LABELED RED BLOOD CELLS IV KIT
21.3100 | PACK | Freq: Once | INTRAVENOUS | Status: AC | PRN
  Administered 2015-01-24: 21.31 via INTRAVENOUS

## 2015-01-24 NOTE — Consult Note (Signed)
GI Inpatient Consult Note  Reason for Consult: lower GI Bleed   Attending Requesting Consult: Dr. Posey Pronto  History of Present Illness: Ashley [name] is a 81 y.o. female with  a history of hypertension, hypothyroid and diverticulosis.  She lives  At the [facility] [place].  Yesterday she was out for lunch and around 4:00 p.m. She felt urgency to have a bowel movement.  When she got up from the table someone had told her that her pants were bloody.  She reports that she had a bowel movement with a large amount of bright red blood.  She denies any abdominal pain or cramping.  She has been reported to the emergency department at [facility].  She had another bowel movement in the emergency department that was witnessed as bright red blood with clots.  She reports this morning she has had 3 episodes of brbpr, she reports that they are starting to clear up and that the last one appeared clear without any blood.  She denies any night sweats, recent weight changes, chest pain or shortness of breath.  She reports having a daily normal soft bowel movement prior to this, denies having to strain to go.  She does report that years ago she had a diverticular bleed which self-resolved.  She does not take any blood thinners, and will occasionally use Aleve, but reports that she has not used any lately.  Her last colonoscopy was  January 30, 2005 with Drs. Earlie Counts and [employer].  Indication; melena of unknown origin, iron deficiency anemia.  Findings; multiple small and large mouth diverticula were found in the sigmoid colon.  A sessile polyp was found in the descending colon.  The polyp was 3 mm in size.  This was small, pale and benign appearing.  The exam was otherwise without abnormality.  All polyp tissue remained intact.  Polyp appears benign.  In this elderly patient on Plavix risk of bleeding with polypectomy I always the risk of the lesion itself, which is very small.  Recommendation at that time was  to repeat for screening colonoscopy in 3 years if clinically indicated at that time.  She did not have another colonoscopy since then   she has no other GI complaints at this time.    Dalene Carrow, MD Past Medical History:  Past Medical History  Diagnosis Date  . Gout   . Hypertension   . Hypothyroid   . Osteoarthritis   . Diverticulitis   . Cancer skin    Problem List: Patient Active Problem List   Diagnosis Date Noted  . GI bleed 01/23/2015    Past Surgical History: Past Surgical History  Procedure Laterality Date  . Cataract extraction    . Finger crystal removal      right 3rd  . Toe crystal removal      right 1st toe    Allergies: No Known Allergies  Home Medications: Prescriptions prior to admission  Medication Sig Dispense Refill Last Dose  . allopurinol (ZYLOPRIM) 100 MG tablet Take 100 mg by mouth daily as needed (for gout flare up.).    Past Month at Unknown time  . Cholecalciferol (VITAMIN D3) 5000 UNITS CAPS Take 5,000 Units by mouth daily.   01/23/2015 at Unknown time  . fenofibrate (TRICOR) 145 MG tablet Take 145 mg by mouth daily.   01/23/2015 at Unknown time  . levothyroxine (SYNTHROID, LEVOTHROID) 150 MCG tablet Take 150 mcg by mouth daily before breakfast.   01/23/2015 at Unknown time  Home medication reconciliation was completed with the patient.   Scheduled Inpatient Medications:   . allopurinol  100 mg Oral Daily  . calcium-vitamin D  1 tablet Oral Daily  . omega-3 acid ethyl esters  1 g Oral Daily   And  . cholecalciferol  1,000 Units Oral Daily  . febuxostat   Oral Daily  . fenofibrate  54 mg Oral Daily  . levothyroxine  100 mcg Oral QAC breakfast  . losartan  100 mg Oral Daily  . pravastatin  40 mg Oral Daily    Continuous Inpatient Infusions:   . sodium chloride 75 mL/hr at 01/24/15 0730    PRN Inpatient Medications:  acetaminophen **OR** acetaminophen  Family History: family history is not on file.   Social History:    reports that she has never smoked. She does not have any smokeless tobacco history on file. She reports that she does not drink alcohol.   Review of Systems: Constitutional: Weight is stable.  Eyes: No changes in vision. ENT: No oral lesions, sore throat.  GI: see HPI.  Heme/Lymph: No easy bruising.  CV: No chest pain.  GU: No hematuria.  Integumentary: No rashes.  Neuro: No headaches.  Psych: No depression/anxiety.  Endocrine: No heat/cold intolerance.  Allergic/Immunologic: No urticaria.  Resp: No cough, SOB.  Musculoskeletal: No joint swelling.    Physical Examination: BP 160/55 mmHg  Pulse 65  Temp(Src) 97.7 F (36.5 C) (Oral)  Resp 16  Ht 5\' 6"  (1.676 m)  Wt 57.425 kg (126 lb 9.6 oz)  BMI 20.44 kg/m2  SpO2 99% Gen: NAD, alert and oriented x 4.   Patient is hard of hearing, wears hearing aids, is deaf and left ear.  She responded appropriately to questions.  Her nephew was also present during the exam and helped with history. HEENT: PEERLA, EOMI, Neck: supple, no JVD or thyromegaly Chest: CTA bilaterally, no wheezes, crackles, or other adventitious sounds CV: RRR, no m/g/c/r Abd: soft, NT, ND, +BS in all four quadrants; no HSM, guarding, ridigity, or rebound tenderness Ext: no edema, well perfused with 2+ pulses, Skin: no rash or lesions noted Lymph: no LAD  Data: Lab Results  Component Value Date   WBC 5.5 01/23/2015   HGB 9.6* 01/24/2015   HCT 39.2 01/23/2015   MCV 93.3 01/23/2015   PLT 204 01/23/2015    Recent Labs Lab 01/23/15 2242 01/24/15 0433 01/24/15 0954  HGB 11.1* 9.5* 9.6*   Lab Results  Component Value Date   NA 140 01/23/2015   K 4.1 01/23/2015   CL 105 01/23/2015   CO2 25 01/23/2015   BUN 30* 01/23/2015   CREATININE 1.13* 01/23/2015   Lab Results  Component Value Date   ALT 15 01/23/2015   AST 32 01/23/2015   ALKPHOS 87 01/23/2015   BILITOT 0.6 01/23/2015    Recent Labs Lab 01/23/15 1821  INR 1.12    Imaging:  CLINICAL  DATA: Rectal bleeding beginning today.  EXAM: CT ABDOMEN AND PELVIS WITH CONTRAST  TECHNIQUE: Multidetector CT imaging of the abdomen and pelvis was performed using the standard protocol following bolus administration of intravenous contrast.  CONTRAST: 53mL OMNIPAQUE IOHEXOL 240 MG/ML SOLN, 37mL OMNIPAQUE IOHEXOL 300 MG/ML SOLN  COMPARISON: None.  FINDINGS: Lung bases demonstrate subtle peripheral increased interstitial markings no consolidation or effusion. Borderline cardiomegaly.  Abdominal images demonstrate subtle nodular contour to the liver. Small calcified granuloma over the right lobe of the liver. The spleen, pancreas, gallbladder and adrenal glands are within normal.  The appendix is not visualized.  Kidneys normal in size with no hydronephrosis or nephrolithiasis. There is a sub cm hypodensity over the upper pole of the right renal cortex too small to characterize but likely a cyst. Ureters are within normal.  There is mild-to-moderate calcified plaque over the abdominal aorta and iliac arteries.  Mesentery is within normal. Small bowel is unremarkable.  There is diverticulosis of the colon most prominent over the sigmoid colon without active inflammation.  Pelvic images demonstrate the bladder, uterus and rectum to be within normal. Adnexal regions are unremarkable.  There are moderate degenerative changes of the spine with multilevel disc disease over the lumbar spine. There are mild degenerative changes of the hips  IMPRESSION: No acute findings in the abdomen/ pelvis.  Moderate diverticulosis of the sigmoid colon without active inflammation.  Sub cm right renal cortical hypodensity too small to characterize, but likely a cyst.  Mild cardiomegaly. Subtle peripheral increased interstitial markings over the lung bases.  Moderate degenerative changes spine with multilevel disc disease over the lumbar  spine.   Electronically Signed  By: Marin Olp M.D.  On: 01/23/2015 20:01  CLINICAL DATA: Bloody stools for 2 days.  EXAM: NUCLEAR MEDICINE GASTROINTESTINAL BLEEDING SCAN  TECHNIQUE: Sequential abdominal images were obtained following intravenous administration of Tc-66m labeled red blood cells.  RADIOPHARMACEUTICALS: 21.3 mCi Tc-32m in-vitro labeled red cells.  COMPARISON: CT scan 01/23/2015  FINDINGS: No active bleeding site is identified.  IMPRESSION: No active bleeding site is identified.   Electronically Signed  By: Marijo Sanes M.D.  On: 01/24/2015 15:08 Assessment/Plan: Ms. Carreon is a 81 y.o. female with normocyctic normochromic anemia possibly secondary to acute GI blood loss.  She has a history of diverticular bleed "years ago" which self- resolved.  Her last colonoscopy showed multiple small and large mouth diverticula in the sigmoid colon.  Her CT of abdomen/pelvis showed this as well. Her Hgb on admission was 11.1 and was 9.5 and 9.6 this morning.  She was on iron previously, however is no longer taking it.  Recommendations: We recommend a NM bleeding scan be done STAT.  We agree with following serial Hgb.  We also recommend checking iron levels on admission blood.  We will continue to follow with you. Thank you for the consult. Please call with questions or concerns.  Salvadore Farber, PA-C  I personally performed these services.

## 2015-01-24 NOTE — Consult Note (Signed)
Subjective: Patient seen for hematochezia. Please see full GI consult by Ms. Richards. Patient presenting with episodic rectal bleeding in the setting of a similar episode several years ago. Colonoscopy in 2006 showing only particular doses. Repeat colonoscopy in 2007 showing possibility of some small angio ectasias in the sigmoid colon. No bowel movement since 0500 this morning.    Objective: Vital signs in last 24 hours: Temp:  [97.5 F (36.4 C)-97.9 F (36.6 C)] 97.7 F (36.5 C) (08/29 0925) Pulse Rate:  [62-67] 65 (08/29 0925) Resp:  [16-18] 16 (08/29 0925) BP: (120-177)/(50-89) 160/55 mmHg (08/29 0925) SpO2:  [99 %-100 %] 99 % (08/29 0925) Weight:  [57.425 kg (126 lb 9.6 oz)] 57.425 kg (126 lb 9.6 oz) (08/28 2154) Blood pressure 160/55, pulse 65, temperature 97.7 F (36.5 C), temperature source Oral, resp. rate 16, height 5\' 6"  (1.676 m), weight 57.425 kg (126 lb 9.6 oz), SpO2 99 %.   Intake/Output from previous day: 08/28 0701 - 08/29 0700 In: 600 [I.V.:600] Out: -   Intake/Output this shift: Total I/O In: 112.5 [I.V.:112.5] Out: -    General appearance:  Elderly female no acute distress Resp:  Clear to auscultation Cardio:  Regular rate and rhythm GI:  Soft nontender nondistended bowel sounds positive normoactive Extremities:     Lab Results: Results for orders placed or performed during the hospital encounter of 01/23/15 (from the past 24 hour(s))  CBC with Differential/Platelet     Status: Abnormal   Collection Time: 01/23/15  6:21 PM  Result Value Ref Range   WBC 5.5 3.6 - 11.0 K/uL   RBC 4.20 3.80 - 5.20 MIL/uL   Hemoglobin 12.9 12.0 - 16.0 g/dL   HCT 39.2 35.0 - 47.0 %   MCV 93.3 80.0 - 100.0 fL   MCH 30.8 26.0 - 34.0 pg   MCHC 33.0 32.0 - 36.0 g/dL   RDW 13.2 11.5 - 14.5 %   Platelets 204 150 - 440 K/uL   Neutrophils Relative % 53 %   Neutro Abs 2.9 1.4 - 6.5 K/uL   Lymphocytes Relative 29 %   Lymphs Abs 1.6 1.0 - 3.6 K/uL   Monocytes Relative 11 %    Monocytes Absolute 0.6 0.2 - 0.9 K/uL   Eosinophils Relative 4 %   Eosinophils Absolute 0.2 0 - 0.7 K/uL   Basophils Relative 3 %   Basophils Absolute 0.2 (H) 0 - 0.1 K/uL  Comprehensive metabolic panel     Status: Abnormal   Collection Time: 01/23/15  6:21 PM  Result Value Ref Range   Sodium 140 135 - 145 mmol/L   Potassium 4.1 3.5 - 5.1 mmol/L   Chloride 105 101 - 111 mmol/L   CO2 25 22 - 32 mmol/L   Glucose, Bld 133 (H) 65 - 99 mg/dL   BUN 30 (H) 6 - 20 mg/dL   Creatinine, Ser 1.13 (H) 0.44 - 1.00 mg/dL   Calcium 9.7 8.9 - 10.3 mg/dL   Total Protein 7.5 6.5 - 8.1 g/dL   Albumin 3.9 3.5 - 5.0 g/dL   AST 32 15 - 41 U/L   ALT 15 14 - 54 U/L   Alkaline Phosphatase 87 38 - 126 U/L   Total Bilirubin 0.6 0.3 - 1.2 mg/dL   GFR calc non Af Amer 41 (L) >60 mL/min   GFR calc Af Amer 47 (L) >60 mL/min   Anion gap 10 5 - 15  Protime-INR     Status: None   Collection Time: 01/23/15  6:21 PM  Result Value Ref Range   Prothrombin Time 14.6 11.4 - 15.0 seconds   INR 1.12   Type and screen for Red Blood Exchange     Status: None   Collection Time: 01/23/15  6:21 PM  Result Value Ref Range   ABO/RH(D) B POS    Antibody Screen NEG    Sample Expiration 01/26/2015   ABO/Rh     Status: None   Collection Time: 01/23/15  6:21 PM  Result Value Ref Range   ABO/RH(D) B POS   Hemoglobin     Status: Abnormal   Collection Time: 01/23/15 10:42 PM  Result Value Ref Range   Hemoglobin 11.1 (L) 12.0 - 16.0 g/dL  Hemoglobin     Status: Abnormal   Collection Time: 01/24/15  4:33 AM  Result Value Ref Range   Hemoglobin 9.5 (L) 12.0 - 16.0 g/dL  Hemoglobin     Status: Abnormal   Collection Time: 01/24/15  9:54 AM  Result Value Ref Range   Hemoglobin 9.6 (L) 12.0 - 16.0 g/dL  Iron and TIBC     Status: Abnormal   Collection Time: 01/24/15  9:54 AM  Result Value Ref Range   Iron 116 28 - 170 ug/dL   TIBC 306 250 - 450 ug/dL   Saturation Ratios 38 (H) 10.4 - 31.8 %   UIBC 191 ug/dL  Hemoglobin      Status: Abnormal   Collection Time: 01/24/15  4:02 PM  Result Value Ref Range   Hemoglobin 9.5 (L) 12.0 - 16.0 g/dL      Recent Labs  01/23/15 1821  01/24/15 0433 01/24/15 0954 01/24/15 1602  WBC 5.5  --   --   --   --   HGB 12.9  < > 9.5* 9.6* 9.5*  HCT 39.2  --   --   --   --   PLT 204  --   --   --   --   < > = values in this interval not displayed. BMET  Recent Labs  01/23/15 1821  NA 140  K 4.1  CL 105  CO2 25  GLUCOSE 133*  BUN 30*  CREATININE 1.13*  CALCIUM 9.7   LFT  Recent Labs  01/23/15 1821  PROT 7.5  ALBUMIN 3.9  AST 32  ALT 15  ALKPHOS 87  BILITOT 0.6   PT/INR  Recent Labs  01/23/15 1821  LABPROT 14.6  INR 1.12   Hepatitis Panel No results for input(s): HEPBSAG, HCVAB, HEPAIGM, HEPBIGM in the last 72 hours. C-Diff No results for input(s): CDIFFTOX in the last 72 hours. No results for input(s): CDIFFPCR in the last 72 hours.   Studies/Results: Nm Gi Blood Loss  01/24/2015   CLINICAL DATA:  Bloody stools for 2 days.  EXAM: NUCLEAR MEDICINE GASTROINTESTINAL BLEEDING SCAN  TECHNIQUE: Sequential abdominal images were obtained following intravenous administration of Tc-32m labeled red blood cells.  RADIOPHARMACEUTICALS:  21.3 mCi Tc-46m in-vitro labeled red cells.  COMPARISON:  CT scan 01/23/2015  FINDINGS: No active bleeding site is identified.  IMPRESSION: No active bleeding site is identified.   Electronically Signed   By: Marijo Sanes M.D.   On: 01/24/2015 15:08   Ct Abdomen Pelvis W Contrast  01/23/2015   CLINICAL DATA:  Rectal bleeding beginning today.  EXAM: CT ABDOMEN AND PELVIS WITH CONTRAST  TECHNIQUE: Multidetector CT imaging of the abdomen and pelvis was performed using the standard protocol following bolus administration of intravenous contrast.  CONTRAST:  37mL OMNIPAQUE IOHEXOL 240 MG/ML SOLN, 40mL OMNIPAQUE IOHEXOL 300 MG/ML SOLN  COMPARISON:  None.  FINDINGS: Lung bases demonstrate subtle peripheral increased interstitial  markings no consolidation or effusion. Borderline cardiomegaly.  Abdominal images demonstrate subtle nodular contour to the liver. Small calcified granuloma over the right lobe of the liver. The spleen, pancreas, gallbladder and adrenal glands are within normal. The appendix is not visualized.  Kidneys normal in size with no hydronephrosis or nephrolithiasis. There is a sub cm hypodensity over the upper pole of the right renal cortex too small to characterize but likely a cyst. Ureters are within normal.  There is mild-to-moderate calcified plaque over the abdominal aorta and iliac arteries.  Mesentery is within normal.  Small bowel is unremarkable.  There is diverticulosis of the colon most prominent over the sigmoid colon without active inflammation.  Pelvic images demonstrate the bladder, uterus and rectum to be within normal. Adnexal regions are unremarkable.  There are moderate degenerative changes of the spine with multilevel disc disease over the lumbar spine. There are mild degenerative changes of the hips  IMPRESSION: No acute findings in the abdomen/ pelvis.  Moderate diverticulosis of the sigmoid colon without active inflammation.  Sub cm right renal cortical hypodensity too small to characterize, but likely a cyst.  Mild cardiomegaly. Subtle peripheral increased interstitial markings over the lung bases.  Moderate degenerative changes spine with multilevel disc disease over the lumbar spine.   Electronically Signed   By: Marin Olp M.D.   On: 01/23/2015 20:01    Scheduled Inpatient Medications:   . allopurinol  100 mg Oral Daily  . calcium-vitamin D  1 tablet Oral Daily  . omega-3 acid ethyl esters  1 g Oral Daily   And  . cholecalciferol  1,000 Units Oral Daily  . febuxostat   Oral Daily  . fenofibrate  54 mg Oral Daily  . levothyroxine  100 mcg Oral QAC breakfast  . losartan  100 mg Oral Daily  . pravastatin  40 mg Oral Daily    Continuous Inpatient Infusions:   . sodium chloride  75 mL/hr at 01/24/15 0730    PRN Inpatient Medications:  acetaminophen **OR** acetaminophen, hydrALAZINE  Miscellaneous:   Assessment:  1. Hematochezia most likely secondary to diverticular bleeding. Currently hemodynamically stable without evidence of recurrent bleeding since this morning. Also had negative bleeding scan this afternoon.  Plan:  Continue daily hemoglobins. Transfuse as needed. No plans for sedated luminal evaluation. Will allow some limited clear liquids today. Following with you  Lollie Sails MD 01/24/2015, 5:58 PM

## 2015-01-24 NOTE — Progress Notes (Signed)
[place] at [facility]                                                                                                                                                                                            Patient Demographics   Ashley Ashley, is a 81 y.o. female, DOB - [birth year], [id number]  Admit date - 01/23/2015   Admitting Physician [name], MD  Outpatient Primary MD for the patient is [name], MD   LOS - 1  Subjective: Patient admitted with bright red blood per rectum. She did have episode last night but none since this morning. Hemoglobin has trended down. She otherwise denies any chest pain or shortness of breath     Review of Systems:   CONSTITUTIONAL: No documented fever. No fatigue, weakness. No weight gain, no weight loss.  EYES: No blurry or double vision.  ENT: No tinnitus. No postnasal drip. No redness of the oropharynx.  RESPIRATORY: No cough, no wheeze, no hemoptysis. No dyspnea.  CARDIOVASCULAR: No chest pain. No orthopnea. No palpitations. No syncope.  GASTROINTESTINAL: No nausea, no vomiting or diarrhea. No abdominal pain.  Positive bright red blood per rectum GENITOURINARY: No dysuria or hematuria.  ENDOCRINE: No polyuria or nocturia. No heat or cold intolerance.  HEMATOLOGY: No anemia. No bruising. No bleeding.  INTEGUMENTARY: No rashes. No lesions.  MUSCULOSKELETAL: No arthritis. No swelling. No gout.  NEUROLOGIC: No numbness, tingling, or ataxia. No seizure-type activity.  PSYCHIATRIC: No anxiety. No insomnia. No ADD.    Vitals:   Filed Vitals:   01/23/15 2158 01/24/15 0052 01/24/15 0500 01/24/15 0925  BP: 177/58 131/50 120/51 160/55  Pulse: 64 62 64 65  Temp: 97.9 F (36.6 C) 97.5 F (36.4 C) 97.8 F (36.6 C) 97.7 F (36.5 C)  TempSrc: Oral Oral Oral Oral  Resp: 16 18 16 16   Height:      Weight:      SpO2: 100% 100% 100% 99%    Wt Readings from Last 3 Encounters:  01/23/15  57.425 kg (126 lb 9.6 oz)  07/31/13 104.327 kg (230 lb)     Intake/Output Summary (Last 24 hours) at 01/24/15 1208 Last data filed at 01/24/15 0730  Gross per 24 hour  Intake  712.5 ml  Output      0 ml  Net  712.5 ml    Physical Exam:   GENERAL: Pleasant-appearing in no apparent distress.  HEAD, EYES, EARS, NOSE AND THROAT: Atraumatic, normocephalic. Extraocular muscles are intact. Pupils equal and reactive to light. Sclerae anicteric. No conjunctival injection. No oro-pharyngeal erythema.  NECK: Supple. There is  no jugular venous distention. No bruits, no lymphadenopathy, no thyromegaly.  HEART: Regular rate and rhythm,. No murmurs, no rubs, no clicks.  LUNGS: Clear to auscultation bilaterally. No rales or rhonchi. No wheezes.  ABDOMEN: Soft, flat, nontender, nondistended. Has good bowel sounds. No hepatosplenomegaly appreciated.  EXTREMITIES: No evidence of any cyanosis, clubbing, or peripheral edema.  +2 pedal and radial pulses bilaterally.  NEUROLOGIC: The patient is alert, awake, and oriented x3 with no focal motor or sensory deficits appreciated bilaterally.  SKIN: Moist and warm with no rashes appreciated.  Psych: Not anxious, depressed LN: No inguinal LN enlargement    Antibiotics   Anti-infectives    None      Medications   Scheduled Meds: . allopurinol  100 mg Oral Daily  . calcium-vitamin D  1 tablet Oral Daily  . omega-3 acid ethyl esters  1 g Oral Daily   And  . cholecalciferol  1,000 Units Oral Daily  . febuxostat   Oral Daily  . fenofibrate  54 mg Oral Daily  . levothyroxine  100 mcg Oral QAC breakfast  . losartan  100 mg Oral Daily  . pravastatin  40 mg Oral Daily   Continuous Infusions: . sodium chloride 75 mL/hr at 01/24/15 0730   PRN Meds:.acetaminophen **OR** acetaminophen   Data Review:   Micro Results No results found for this or any previous visit (from the past 240 hour(s)).  Radiology Reports Ct Abdomen Pelvis W  Contrast  01/23/2015   CLINICAL DATA:  Rectal bleeding beginning today.  EXAM: CT ABDOMEN AND PELVIS WITH CONTRAST  TECHNIQUE: Multidetector CT imaging of the abdomen and pelvis was performed using the standard protocol following bolus administration of intravenous contrast.  CONTRAST:  68mL OMNIPAQUE IOHEXOL 240 MG/ML SOLN, 57mL OMNIPAQUE IOHEXOL 300 MG/ML SOLN  COMPARISON:  None.  FINDINGS: Lung bases demonstrate subtle peripheral increased interstitial markings no consolidation or effusion. Borderline cardiomegaly.  Abdominal images demonstrate subtle nodular contour to the liver. Small calcified granuloma over the right lobe of the liver. The spleen, pancreas, gallbladder and adrenal glands are within normal. The appendix is not visualized.  Kidneys normal in size with no hydronephrosis or nephrolithiasis. There is a sub cm hypodensity over the upper pole of the right renal cortex too small to characterize but likely a cyst. Ureters are within normal.  There is mild-to-moderate calcified plaque over the abdominal aorta and iliac arteries.  Mesentery is within normal.  Small bowel is unremarkable.  There is diverticulosis of the colon most prominent over the sigmoid colon without active inflammation.  Pelvic images demonstrate the bladder, uterus and rectum to be within normal. Adnexal regions are unremarkable.  There are moderate degenerative changes of the spine with multilevel disc disease over the lumbar spine. There are mild degenerative changes of the hips  IMPRESSION: No acute findings in the abdomen/ pelvis.  Moderate diverticulosis of the sigmoid colon without active inflammation.  Sub cm right renal cortical hypodensity too small to characterize, but likely a cyst.  Mild cardiomegaly. Subtle peripheral increased interstitial markings over the lung bases.  Moderate degenerative changes spine with multilevel disc disease over the lumbar spine.   Electronically Signed   By: Marin Olp M.D.   On:  01/23/2015 20:01     CBC  Recent Labs Lab 01/23/15 1821 01/23/15 2242 01/24/15 0433 01/24/15 0954  WBC 5.5  --   --   --   HGB 12.9 11.1* 9.5* 9.6*  HCT 39.2  --   --   --  PLT 204  --   --   --   MCV 93.3  --   --   --   MCH 30.8  --   --   --   MCHC 33.0  --   --   --   RDW 13.2  --   --   --   LYMPHSABS 1.6  --   --   --   MONOABS 0.6  --   --   --   EOSABS 0.2  --   --   --   BASOSABS 0.2*  --   --   --     Chemistries   Recent Labs Lab 01/23/15 1821  NA 140  K 4.1  CL 105  CO2 25  GLUCOSE 133*  BUN 30*  CREATININE 1.13*  CALCIUM 9.7  AST 32  ALT 15  ALKPHOS 87  BILITOT 0.6   ------------------------------------------------------------------------------------------------------------------ estimated creatinine clearance is 28.2 mL/min (by C-G formula based on Cr of 1.13). ------------------------------------------------------------------------------------------------------------------ No results for input(s): HGBA1C in the last 72 hours. ------------------------------------------------------------------------------------------------------------------ No results for input(s): CHOL, HDL, LDLCALC, TRIG, CHOLHDL, LDLDIRECT in the last 72 hours. ------------------------------------------------------------------------------------------------------------------ No results for input(s): TSH, T4TOTAL, T3FREE, THYROIDAB in the last 72 hours.  Invalid input(s): FREET3 ------------------------------------------------------------------------------------------------------------------ No results for input(s): VITAMINB12, FOLATE, FERRITIN, TIBC, IRON, RETICCTPCT in the last 72 hours.  Coagulation profile  Recent Labs Lab 01/23/15 1821  INR 1.12    No results for input(s): DDIMER in the last 72 hours.  Cardiac Enzymes No results for input(s): CKMB, TROPONINI, MYOGLOBIN in the last 168 hours.  Invalid input(s):  CK ------------------------------------------------------------------------------------------------------------------ Invalid input(s): [place]    1. GI Bleed:  Due to diverticular in nature, continue to monitor hemoglobin, bleeding scan if rebleeds, GI evaluation  2. Acute Renal Failure: Suspect secondary from volume loss from blood. Follow BMP in the a.m.  3. HTN: Tinea losartan. Blood pressure slightly elevated but monitor for now I will place hydralazine if as needed  4. Hypothyroidism: Continue Synthroid.      Code Status Orders        Start     Ordered   01/23/15 2206  Do not attempt resuscitation (DNR)   Continuous    Question Answer Comment  In the event of cardiac or respiratory ARREST Do not call a "code blue"   In the event of cardiac or respiratory ARREST Do not perform Intubation, CPR, defibrillation or ACLS   In the event of cardiac or respiratory ARREST Use medication by any route, position, wound care, and other measures to relive pain and suffering. May use oxygen, suction and manual treatment of airway obstruction as needed for comfort.      01/23/15 2205           Consults GI  DVT Prophylaxis   SCDs   Lab Results  Component Value Date   PLT 204 01/23/2015     Time Spent in minutes   50min Dustin Flock M.D on 01/24/2015 at 12:08 PM  Between 7am to 6pm - Pager - [phone]  After 6pm go to [email or web address] - password [email or web address] [facility]   Office  [phone]

## 2015-01-25 LAB — URINALYSIS COMPLETE WITH MICROSCOPIC (ARMC ONLY)
BILIRUBIN URINE: NEGATIVE
Bacteria, UA: NONE SEEN
Glucose, UA: NEGATIVE mg/dL
KETONES UR: NEGATIVE mg/dL
NITRITE: NEGATIVE
PH: 6 (ref 5.0–8.0)
PROTEIN: NEGATIVE mg/dL
SPECIFIC GRAVITY, URINE: 1.009 (ref 1.005–1.030)

## 2015-01-25 LAB — BASIC METABOLIC PANEL
Anion gap: 5 (ref 5–15)
BUN: 21 mg/dL — ABNORMAL HIGH (ref 6–20)
CHLORIDE: 114 mmol/L — AB (ref 101–111)
CO2: 23 mmol/L (ref 22–32)
CREATININE: 1.02 mg/dL — AB (ref 0.44–1.00)
Calcium: 8.8 mg/dL — ABNORMAL LOW (ref 8.9–10.3)
GFR calc non Af Amer: 46 mL/min — ABNORMAL LOW (ref >60)
GFR, EST AFRICAN AMERICAN: 53 mL/min — AB (ref >60)
Glucose, Bld: 101 mg/dL — ABNORMAL HIGH (ref 65–99)
POTASSIUM: 4.5 mmol/L (ref 3.5–5.1)
SODIUM: 142 mmol/L (ref 135–145)

## 2015-01-25 LAB — CBC
HEMATOCRIT: 25.8 % — AB (ref 35.0–47.0)
HEMOGLOBIN: 8.5 g/dL — AB (ref 12.0–16.0)
MCH: 30.8 pg (ref 26.0–34.0)
MCHC: 32.9 g/dL (ref 32.0–36.0)
MCV: 93.8 fL (ref 80.0–100.0)
Platelets: 155 10*3/uL (ref 150–440)
RBC: 2.76 MIL/uL — AB (ref 3.80–5.20)
RDW: 13 % (ref 11.5–14.5)
WBC: 3.6 10*3/uL (ref 3.6–11.0)

## 2015-01-25 LAB — HEMOGLOBIN
HEMOGLOBIN: 9.7 g/dL — AB (ref 12.0–16.0)
HEMOGLOBIN: 9 g/dL — AB (ref 12.0–16.0)

## 2015-01-25 MED ORDER — LEVOTHYROXINE SODIUM 25 MCG PO TABS
150.0000 ug | ORAL_TABLET | Freq: Every day | ORAL | Status: DC
  Administered 2015-01-26 – 2015-01-29 (×4): 150 ug via ORAL
  Filled 2015-01-25 (×4): qty 1

## 2015-01-25 NOTE — Progress Notes (Addendum)
[place] at [facility]                                                                                                                                                                                            Patient Demographics   Ashley [name], is a 81 y.o. female, DOB - May 31, 1918, [id number]  Admit date - 01/23/2015   Admitting Physician [name], MD  Outpatient Primary MD for the patient is [name], MD   LOS - 2  Subjective: Patient hasn't had a bowel movement this morning hemoglobin has dropped   Review of Systems:   CONSTITUTIONAL: No documented fever. No fatigue, weakness. No weight gain, no weight loss.  EYES: No blurry or double vision.  ENT: No tinnitus. No postnasal drip. No redness of the oropharynx.  RESPIRATORY: No cough, no wheeze, no hemoptysis. No dyspnea.  CARDIOVASCULAR: No chest pain. No orthopnea. No palpitations. No syncope.  GASTROINTESTINAL: No nausea, no vomiting or diarrhea. No abdominal pain.  Positive bright red blood per rectum GENITOURINARY: No dysuria or hematuria.  ENDOCRINE: No polyuria or nocturia. No heat or cold intolerance.  HEMATOLOGY: No anemia. No bruising. No bleeding.  INTEGUMENTARY: No rashes. No lesions.  MUSCULOSKELETAL: No arthritis. No swelling. No gout.  NEUROLOGIC: No numbness, tingling, or ataxia. No seizure-type activity.  PSYCHIATRIC: No anxiety. No insomnia. No ADD.    Vitals:   Filed Vitals:   01/25/15 0455 01/25/15 0751 01/25/15 0753 01/25/15 0830  BP: 152/73 185/68 188/62 160/72  Pulse: 71 72    Temp: 97.5 F (36.4 C) 97.8 F (36.6 C)    TempSrc: Oral Oral    Resp: 18 18    Height:      Weight:      SpO2: 100% 99%      Wt Readings from Last 3 Encounters:  01/23/15 57.425 kg (126 lb 9.6 oz)  07/31/13 104.327 kg (230 lb)     Intake/Output Summary (Last 24 hours) at 01/25/15 1203 Last data filed at 01/25/15 1149  Gross per 24 hour  Intake 1808.75 ml   Output    825 ml  Net 983.75 ml    Physical Exam:   GENERAL: Pleasant-appearing in no apparent distress.  HEAD, EYES, EARS, NOSE AND THROAT: Atraumatic, normocephalic. Extraocular muscles are intact. Pupils equal and reactive to light. Sclerae anicteric. No conjunctival injection. No oro-pharyngeal erythema.  NECK: Supple. There is no jugular venous distention. No bruits, no lymphadenopathy, no thyromegaly.  HEART: Regular rate and rhythm,. No murmurs, no rubs, no clicks.  LUNGS: Clear to auscultation bilaterally. No rales or rhonchi. No wheezes.  ABDOMEN: Soft, flat, nontender, nondistended. Has good bowel sounds. No hepatosplenomegaly appreciated.  EXTREMITIES: No evidence of any cyanosis, clubbing, or peripheral edema.  +2 pedal and radial pulses bilaterally.  NEUROLOGIC: The patient is alert, awake, and oriented x3 with no focal motor or sensory deficits appreciated bilaterally.  SKIN: Moist and warm with no rashes appreciated.  Psych: Not anxious, depressed LN: No inguinal LN enlargement    Antibiotics   Anti-infectives    None      Medications   Scheduled Meds: . allopurinol  100 mg Oral Daily  . calcium-vitamin D  1 tablet Oral Daily  . omega-3 acid ethyl esters  1 g Oral Daily   And  . cholecalciferol  1,000 Units Oral Daily  . febuxostat   Oral Daily  . fenofibrate  54 mg Oral Daily  . levothyroxine  100 mcg Oral QAC breakfast  . losartan  100 mg Oral Daily  . pravastatin  40 mg Oral Daily   Continuous Infusions:   PRN Meds:.acetaminophen **OR** acetaminophen, hydrALAZINE   Data Review:   Micro Results No results found for this or any previous visit (from the past 240 hour(s)).  Radiology Reports Nm Gi Blood Loss  01/24/2015   CLINICAL DATA:  Bloody stools for 2 days.  EXAM: NUCLEAR MEDICINE GASTROINTESTINAL BLEEDING SCAN  TECHNIQUE: Sequential abdominal images were obtained following intravenous administration of Tc-64m labeled red blood cells.   RADIOPHARMACEUTICALS:  21.3 mCi Tc-73m in-vitro labeled red cells.  COMPARISON:  CT scan 01/23/2015  FINDINGS: No active bleeding site is identified.  IMPRESSION: No active bleeding site is identified.   Electronically Signed   By: Marijo Sanes M.D.   On: 01/24/2015 15:08   Ct Abdomen Pelvis W Contrast  01/23/2015   CLINICAL DATA:  Rectal bleeding beginning today.  EXAM: CT ABDOMEN AND PELVIS WITH CONTRAST  TECHNIQUE: Multidetector CT imaging of the abdomen and pelvis was performed using the standard protocol following bolus administration of intravenous contrast.  CONTRAST:  76mL OMNIPAQUE IOHEXOL 240 MG/ML SOLN, 54mL OMNIPAQUE IOHEXOL 300 MG/ML SOLN  COMPARISON:  None.  FINDINGS: Lung bases demonstrate subtle peripheral increased interstitial markings no consolidation or effusion. Borderline cardiomegaly.  Abdominal images demonstrate subtle nodular contour to the liver. Small calcified granuloma over the right lobe of the liver. The spleen, pancreas, gallbladder and adrenal glands are within normal. The appendix is not visualized.  Kidneys normal in size with no hydronephrosis or nephrolithiasis. There is a sub cm hypodensity over the upper pole of the right renal cortex too small to characterize but likely a cyst. Ureters are within normal.  There is mild-to-moderate calcified plaque over the abdominal aorta and iliac arteries.  Mesentery is within normal.  Small bowel is unremarkable.  There is diverticulosis of the colon most prominent over the sigmoid colon without active inflammation.  Pelvic images demonstrate the bladder, uterus and rectum to be within normal. Adnexal regions are unremarkable.  There are moderate degenerative changes of the spine with multilevel disc disease over the lumbar spine. There are mild degenerative changes of the hips  IMPRESSION: No acute findings in the abdomen/ pelvis.  Moderate diverticulosis of the sigmoid colon without active inflammation.  Sub cm right renal cortical  hypodensity too small to characterize, but likely a cyst.  Mild cardiomegaly. Subtle peripheral increased interstitial markings over the lung bases.  Moderate degenerative changes spine with multilevel disc disease over the lumbar spine.   Electronically Signed   By: Marin Olp M.D.  On: 01/23/2015 20:01     CBC  Recent Labs Lab 01/23/15 1821  01/24/15 0954 01/24/15 1602 01/24/15 2138 01/25/15 0655 01/25/15 1013  WBC 5.5  --   --   --   --  3.6  --   HGB 12.9  < > 9.6* 9.5* 9.1* 8.5* 9.7*  HCT 39.2  --   --   --   --  25.8*  --   PLT 204  --   --   --   --  155  --   MCV 93.3  --   --   --   --  93.8  --   MCH 30.8  --   --   --   --  30.8  --   MCHC 33.0  --   --   --   --  32.9  --   RDW 13.2  --   --   --   --  13.0  --   LYMPHSABS 1.6  --   --   --   --   --   --   MONOABS 0.6  --   --   --   --   --   --   EOSABS 0.2  --   --   --   --   --   --   BASOSABS 0.2*  --   --   --   --   --   --   < > = values in this interval not displayed.  Chemistries   Recent Labs Lab 01/23/15 1821 01/25/15 0655  NA 140 142  K 4.1 4.5  CL 105 114*  CO2 25 23  GLUCOSE 133* 101*  BUN 30* 21*  CREATININE 1.13* 1.02*  CALCIUM 9.7 8.8*  AST 32  --   ALT 15  --   ALKPHOS 87  --   BILITOT 0.6  --    ------------------------------------------------------------------------------------------------------------------ estimated creatinine clearance is 31.2 mL/min (by C-G formula based on Cr of 1.02). ------------------------------------------------------------------------------------------------------------------ No results for input(s): HGBA1C in the last 72 hours. ------------------------------------------------------------------------------------------------------------------ No results for input(s): CHOL, HDL, LDLCALC, TRIG, CHOLHDL, LDLDIRECT in the last 72 hours. ------------------------------------------------------------------------------------------------------------------ No  results for input(s): TSH, T4TOTAL, T3FREE, THYROIDAB in the last 72 hours.  Invalid input(s): FREET3 ------------------------------------------------------------------------------------------------------------------  Recent Labs  01/24/15 0954  TIBC 306  IRON 116    Coagulation profile  Recent Labs Lab 01/23/15 1821  INR 1.12    No results for input(s): DDIMER in the last 72 hours.  Cardiac Enzymes No results for input(s): CKMB, TROPONINI, MYOGLOBIN in the last 168 hours.  Invalid input(s): CK ------------------------------------------------------------------------------------------------------------------ Invalid input(s): [place]    1. GI Bleed:  Due to diverticular in nature, continue to monitor hemoglobin. Bleeding scan negative follow H&H  2. Acute Renal Failure: Suspect secondary from volume loss from blood. Follow BMP in the a.m.  3. HTN:  Continue losartan. Blood pressure slightly elevated but monitor for now I will place hydralazine if as needed  4. Hypothyroidism: Continue Synthroid.      Code Status Orders        Start     Ordered   01/23/15 2206  Do not attempt resuscitation (DNR)   Continuous    Question Answer Comment  In the event of cardiac or respiratory ARREST Do not call a "code blue"   In the event of cardiac or respiratory ARREST Do not perform Intubation, CPR, defibrillation or ACLS  In the event of cardiac or respiratory ARREST Use medication by any route, position, wound care, and other measures to relive pain and suffering. May use oxygen, suction and manual treatment of airway obstruction as needed for comfort.      01/23/15 2205           Consults GI  DVT Prophylaxis   SCDs   Lab Results  Component Value Date   PLT 155 01/25/2015     Time Spent in minutes   77min Andyn Sales, Chana Bode M.D on 01/25/2015 at 12:03 PM  Between 7am to 6pm - Pager - [phone]  After 6pm go to [email or web address] - password  [email or web address] [place] [facility]   Office  [phone]

## 2015-01-25 NOTE — Plan of Care (Signed)
Problem: Phase I Progression Outcomes Goal: Pain controlled with appropriate interventions Outcome: Adequate for Discharge No complaints of pain Goal: OOB as tolerated unless otherwise ordered Outcome: Completed/Met Date Met:  01/25/15 Ambulating to bathroom with assistance Goal: Voiding-avoid urinary catheter unless indicated Outcome: Completed/Met Date Met:  01/25/15 No need for foley catheter.     

## 2015-01-25 NOTE — Progress Notes (Signed)
Dr Donnella Sham is currently in the room seeing the patient. He wants patient to go back to a clear liquid diet

## 2015-01-25 NOTE — Consult Note (Signed)
Subjective: Patient seen for hematochezial  2-3 stools today, brown/bloody.  No abdominal pain or nausea.   Objective: Vital signs in last 24 hours: Temp:  [97.5 F (36.4 C)-98.1 F (36.7 C)] 97.5 F (36.4 C) (08/30 1606) Pulse Rate:  [67-72] 67 (08/30 1606) Resp:  [18] 18 (08/30 1606) BP: (138-188)/(57-73) 157/57 mmHg (08/30 1606) SpO2:  [99 %-100 %] 100 % (08/30 1606) Blood pressure 157/57, pulse 67, temperature 97.5 F (36.4 C), temperature source Oral, resp. rate 18, height 5\' 6"  (1.676 m), weight 57.425 kg (126 lb 9.6 oz), SpO2 100 %.   Intake/Output from previous day: 08/29 0701 - 08/30 0700 In: 1681.3 [I.V.:1681.3] Out: 0   Intake/Output this shift: Total I/O In: 665 [P.O.:240; I.V.:425] Out: 1050 [Urine:1050]   General appearance:  Elderly f NAD Resp:  bcta Cardio:  rrr GI:  Soft, nt/nd/bs+/n Extremities:  No CCE DRE: brown/marroon effluent, loose.    Lab Results: Results for orders placed or performed during the hospital encounter of 01/23/15 (from the past 24 hour(s))  Hemoglobin     Status: Abnormal   Collection Time: 01/24/15  9:38 PM  Result Value Ref Range   Hemoglobin 9.1 (L) 12.0 - 16.0 g/dL  CBC     Status: Abnormal   Collection Time: 01/25/15  6:55 AM  Result Value Ref Range   WBC 3.6 3.6 - 11.0 K/uL   RBC 2.76 (L) 3.80 - 5.20 MIL/uL   Hemoglobin 8.5 (L) 12.0 - 16.0 g/dL   HCT 25.8 (L) 35.0 - 47.0 %   MCV 93.8 80.0 - 100.0 fL   MCH 30.8 26.0 - 34.0 pg   MCHC 32.9 32.0 - 36.0 g/dL   RDW 13.0 11.5 - 14.5 %   Platelets 155 150 - 440 K/uL  Basic metabolic panel     Status: Abnormal   Collection Time: 01/25/15  6:55 AM  Result Value Ref Range   Sodium 142 135 - 145 mmol/L   Potassium 4.5 3.5 - 5.1 mmol/L   Chloride 114 (H) 101 - 111 mmol/L   CO2 23 22 - 32 mmol/L   Glucose, Bld 101 (H) 65 - 99 mg/dL   BUN 21 (H) 6 - 20 mg/dL   Creatinine, Ser 1.02 (H) 0.44 - 1.00 mg/dL   Calcium 8.8 (L) 8.9 - 10.3 mg/dL   GFR calc non Af Amer 46 (L) >60  mL/min   GFR calc Af Amer 53 (L) >60 mL/min   Anion gap 5 5 - 15  Urinalysis complete, with microscopic ([facility] only)     Status: Abnormal   Collection Time: 01/25/15  9:00 AM  Result Value Ref Range   Color, Urine YELLOW (A) YELLOW   APPearance CLEAR (A) CLEAR   Glucose, UA NEGATIVE NEGATIVE mg/dL   Bilirubin Urine NEGATIVE NEGATIVE   Ketones, ur NEGATIVE NEGATIVE mg/dL   Specific Gravity, Urine 1.009 1.005 - 1.030   Hgb urine dipstick 1+ (A) NEGATIVE   pH 6.0 5.0 - 8.0   Protein, ur NEGATIVE NEGATIVE mg/dL   Nitrite NEGATIVE NEGATIVE   Leukocytes, UA TRACE (A) NEGATIVE   RBC / HPF 0-5 0 - 5 RBC/hpf   WBC, UA 6-30 0 - 5 WBC/hpf   Bacteria, UA NONE SEEN NONE SEEN   Squamous Epithelial / LPF 0-5 (A) NONE SEEN  Hemoglobin     Status: Abnormal   Collection Time: 01/25/15 10:13 AM  Result Value Ref Range   Hemoglobin 9.7 (L) 12.0 - 16.0 g/dL  Hemoglobin  Status: Abnormal   Collection Time: 01/25/15  3:55 PM  Result Value Ref Range   Hemoglobin 9.0 (L) 12.0 - 16.0 g/dL      Recent Labs  01/23/15 1821  01/25/15 0655 01/25/15 1013 01/25/15 1555  WBC 5.5  --  3.6  --   --   HGB 12.9  < > 8.5* 9.7* 9.0*  HCT 39.2  --  25.8*  --   --   PLT 204  --  155  --   --   < > = values in this interval not displayed. BMET  Recent Labs  01/23/15 1821 01/25/15 0655  NA 140 142  K 4.1 4.5  CL 105 114*  CO2 25 23  GLUCOSE 133* 101*  BUN 30* 21*  CREATININE 1.13* 1.02*  CALCIUM 9.7 8.8*   LFT  Recent Labs  01/23/15 1821  PROT 7.5  ALBUMIN 3.9  AST 32  ALT 15  ALKPHOS 87  BILITOT 0.6   PT/INR  Recent Labs  01/23/15 1821  LABPROT 14.6  INR 1.12   Hepatitis Panel No results for input(s): HEPBSAG, HCVAB, HEPAIGM, HEPBIGM in the last 72 hours. C-Diff No results for input(s): CDIFFTOX in the last 72 hours. No results for input(s): CDIFFPCR in the last 72 hours.   Studies/Results: Nm Gi Blood Loss  01/24/2015   CLINICAL DATA:  Bloody stools for 2 days.  EXAM:  NUCLEAR MEDICINE GASTROINTESTINAL BLEEDING SCAN  TECHNIQUE: Sequential abdominal images were obtained following intravenous administration of Tc-5m labeled red blood cells.  RADIOPHARMACEUTICALS:  21.3 mCi Tc-24m in-vitro labeled red cells.  COMPARISON:  CT scan 01/23/2015  FINDINGS: No active bleeding site is identified.  IMPRESSION: No active bleeding site is identified.   Electronically Signed   By: Marijo Sanes M.D.   On: 01/24/2015 15:08   Ct Abdomen Pelvis W Contrast  01/23/2015   CLINICAL DATA:  Rectal bleeding beginning today.  EXAM: CT ABDOMEN AND PELVIS WITH CONTRAST  TECHNIQUE: Multidetector CT imaging of the abdomen and pelvis was performed using the standard protocol following bolus administration of intravenous contrast.  CONTRAST:  48mL OMNIPAQUE IOHEXOL 240 MG/ML SOLN, 53mL OMNIPAQUE IOHEXOL 300 MG/ML SOLN  COMPARISON:  None.  FINDINGS: Lung bases demonstrate subtle peripheral increased interstitial markings no consolidation or effusion. Borderline cardiomegaly.  Abdominal images demonstrate subtle nodular contour to the liver. Small calcified granuloma over the right lobe of the liver. The spleen, pancreas, gallbladder and adrenal glands are within normal. The appendix is not visualized.  Kidneys normal in size with no hydronephrosis or nephrolithiasis. There is a sub cm hypodensity over the upper pole of the right renal cortex too small to characterize but likely a cyst. Ureters are within normal.  There is mild-to-moderate calcified plaque over the abdominal aorta and iliac arteries.  Mesentery is within normal.  Small bowel is unremarkable.  There is diverticulosis of the colon most prominent over the sigmoid colon without active inflammation.  Pelvic images demonstrate the bladder, uterus and rectum to be within normal. Adnexal regions are unremarkable.  There are moderate degenerative changes of the spine with multilevel disc disease over the lumbar spine. There are mild degenerative  changes of the hips  IMPRESSION: No acute findings in the abdomen/ pelvis.  Moderate diverticulosis of the sigmoid colon without active inflammation.  Sub cm right renal cortical hypodensity too small to characterize, but likely a cyst.  Mild cardiomegaly. Subtle peripheral increased interstitial markings over the lung bases.  Moderate degenerative changes spine with  multilevel disc disease over the lumbar spine.   Electronically Signed   By: Marin Olp M.D.   On: 01/23/2015 20:01    Scheduled Inpatient Medications:   . calcium-vitamin D  1 tablet Oral Daily  . omega-3 acid ethyl esters  1 g Oral Daily   And  . cholecalciferol  1,000 Units Oral Daily  . fenofibrate  54 mg Oral Daily  . [START ON 01/26/2015] levothyroxine  150 mcg Oral QAC breakfast  . losartan  100 mg Oral Daily  . pravastatin  40 mg Oral Daily    Continuous Inpatient Infusions:     PRN Inpatient Medications:  acetaminophen **OR** acetaminophen, hydrALAZINE  Miscellaneous:   Assessment:  1) hematochezia-hemodynamically stable (note HTN),  Hemoglobin variable, but trend is stable.  Likely diverticular bleeding, old stool material mixed with blood.  CT showing "moderate" diverticulosis.   Plan:  1) continue current.  Serial hgb, transfuse if needed.  Following.   Lollie Sails MD 01/25/2015, 5:39 PM

## 2015-01-25 NOTE — Care Management Important Message (Signed)
Important Message  Patient Details  Name: Ashley [name] MRN: [id number] Date of Birth: Jun 07, 1918   Medicare Important Message Given:  Yes-second notification given    Juliann Pulse A Allmond 01/25/2015, 10:10 AM

## 2015-01-26 LAB — CBC
HCT: 25.7 % — ABNORMAL LOW (ref 35.0–47.0)
HEMOGLOBIN: 8.6 g/dL — AB (ref 12.0–16.0)
MCH: 31.1 pg (ref 26.0–34.0)
MCHC: 33.2 g/dL (ref 32.0–36.0)
MCV: 93.7 fL (ref 80.0–100.0)
Platelets: 150 10*3/uL (ref 150–440)
RBC: 2.75 MIL/uL — AB (ref 3.80–5.20)
RDW: 13.2 % (ref 11.5–14.5)
WBC: 3.6 10*3/uL (ref 3.6–11.0)

## 2015-01-26 LAB — HEMOGLOBIN
Hemoglobin: 8.9 g/dL — ABNORMAL LOW (ref 12.0–16.0)
Hemoglobin: 8.8 g/dL — ABNORMAL LOW (ref 12.0–16.0)
Hemoglobin: 8.3 g/dL — ABNORMAL LOW (ref 12.0–16.0)

## 2015-01-26 NOTE — Progress Notes (Addendum)
[place] at [facility]                                                                                                                                                                                            Patient Demographics   Ashley [name], is a 81 y.o. female, DOB - 05/30/1918, [id number]  Admit date - 01/23/2015   Admitting Physician [name], MD  Outpatient Primary MD for the patient is [name], MD   LOS - 3  Subjective:  Patient had another episode last night with bright red blood, she is back on clear liquid diet. Hemoglobin has dropped a little bit   Review of Systems:   CONSTITUTIONAL: No documented fever. No fatigue, weakness. No weight gain, no weight loss.  EYES: No blurry or double vision.  ENT: No tinnitus. No postnasal drip. No redness of the oropharynx.  RESPIRATORY: No cough, no wheeze, no hemoptysis. No dyspnea.  CARDIOVASCULAR: No chest pain. No orthopnea. No palpitations. No syncope.  GASTROINTESTINAL: No nausea, no vomiting or diarrhea. No abdominal pain.  Positive bright red blood per rectum GENITOURINARY: No dysuria or hematuria.  ENDOCRINE: No polyuria or nocturia. No heat or cold intolerance.  HEMATOLOGY: No anemia. No bruising. No bleeding.  INTEGUMENTARY: No rashes. No lesions.  MUSCULOSKELETAL: No arthritis. No swelling. No gout.  NEUROLOGIC: No numbness, tingling, or ataxia. No seizure-type activity.  PSYCHIATRIC: No anxiety. No insomnia. No ADD.    Vitals:   Filed Vitals:   01/25/15 1606 01/25/15 2005 01/26/15 0449 01/26/15 0742  BP: 157/57 158/66 139/56 173/74  Pulse: 67 70 62 72  Temp: 97.5 F (36.4 C) 97.6 F (36.4 C) 98.2 F (36.8 C) 98 F (36.7 C)  TempSrc: Oral Oral Oral Oral  Resp: 18 18 18 16   Height:      Weight:      SpO2: 100% 100% 99% 99%    Wt Readings from Last 3 Encounters:  01/23/15 57.425 kg (126 lb 9.6 oz)  07/31/13 104.327 kg (230 lb)     Intake/Output  Summary (Last 24 hours) at 01/26/15 1247 Last data filed at 01/26/15 1017  Gross per 24 hour  Intake   1300 ml  Output   1425 ml  Net   -125 ml    Physical Exam:   GENERAL: Pleasant-appearing in no apparent distress.  HEAD, EYES, EARS, NOSE AND THROAT: Atraumatic, normocephalic. Extraocular muscles are intact. Pupils equal and reactive to light. Sclerae anicteric. No conjunctival injection. No oro-pharyngeal erythema.  NECK: Supple. There is no jugular venous distention. No bruits, no lymphadenopathy, no thyromegaly.  HEART:  Regular rate and rhythm,. No murmurs, no rubs, no clicks.  LUNGS: Clear to auscultation bilaterally. No rales or rhonchi. No wheezes.  ABDOMEN: Soft, flat, nontender, nondistended. Has good bowel sounds. No hepatosplenomegaly appreciated.  EXTREMITIES: No evidence of any cyanosis, clubbing, or peripheral edema.  +2 pedal and radial pulses bilaterally.  NEUROLOGIC: The patient is alert, awake, and oriented x3 with no focal motor or sensory deficits appreciated bilaterally.  SKIN: Moist and warm with no rashes appreciated.  Psych: Not anxious, depressed LN: No inguinal LN enlargement    Antibiotics   Anti-infectives    None      Medications   Scheduled Meds: . calcium-vitamin D  1 tablet Oral Daily  . omega-3 acid ethyl esters  1 g Oral Daily   And  . cholecalciferol  1,000 Units Oral Daily  . fenofibrate  54 mg Oral Daily  . levothyroxine  150 mcg Oral QAC breakfast  . losartan  100 mg Oral Daily  . pravastatin  40 mg Oral Daily   Continuous Infusions:   PRN Meds:.acetaminophen **OR** acetaminophen, hydrALAZINE   Data Review:   Micro Results No results found for this or any previous visit (from the past 240 hour(s)).  Radiology Reports Nm Gi Blood Loss  01/24/2015   CLINICAL DATA:  Bloody stools for 2 days.  EXAM: NUCLEAR MEDICINE GASTROINTESTINAL BLEEDING SCAN  TECHNIQUE: Sequential abdominal images were obtained following intravenous  administration of Tc-66m labeled red blood cells.  RADIOPHARMACEUTICALS:  21.3 mCi Tc-6m in-vitro labeled red cells.  COMPARISON:  CT scan 01/23/2015  FINDINGS: No active bleeding site is identified.  IMPRESSION: No active bleeding site is identified.   Electronically Signed   By: Marijo Sanes M.D.   On: 01/24/2015 15:08   Ct Abdomen Pelvis W Contrast  01/23/2015   CLINICAL DATA:  Rectal bleeding beginning today.  EXAM: CT ABDOMEN AND PELVIS WITH CONTRAST  TECHNIQUE: Multidetector CT imaging of the abdomen and pelvis was performed using the standard protocol following bolus administration of intravenous contrast.  CONTRAST:  78mL OMNIPAQUE IOHEXOL 240 MG/ML SOLN, 54mL OMNIPAQUE IOHEXOL 300 MG/ML SOLN  COMPARISON:  None.  FINDINGS: Lung bases demonstrate subtle peripheral increased interstitial markings no consolidation or effusion. Borderline cardiomegaly.  Abdominal images demonstrate subtle nodular contour to the liver. Small calcified granuloma over the right lobe of the liver. The spleen, pancreas, gallbladder and adrenal glands are within normal. The appendix is not visualized.  Kidneys normal in size with no hydronephrosis or nephrolithiasis. There is a sub cm hypodensity over the upper pole of the right renal cortex too small to characterize but likely a cyst. Ureters are within normal.  There is mild-to-moderate calcified plaque over the abdominal aorta and iliac arteries.  Mesentery is within normal.  Small bowel is unremarkable.  There is diverticulosis of the colon most prominent over the sigmoid colon without active inflammation.  Pelvic images demonstrate the bladder, uterus and rectum to be within normal. Adnexal regions are unremarkable.  There are moderate degenerative changes of the spine with multilevel disc disease over the lumbar spine. There are mild degenerative changes of the hips  IMPRESSION: No acute findings in the abdomen/ pelvis.  Moderate diverticulosis of the sigmoid colon without  active inflammation.  Sub cm right renal cortical hypodensity too small to characterize, but likely a cyst.  Mild cardiomegaly. Subtle peripheral increased interstitial markings over the lung bases.  Moderate degenerative changes spine with multilevel disc disease over the lumbar spine.   Electronically Signed  By: Marin Olp M.D.   On: 01/23/2015 20:01     CBC  Recent Labs Lab 01/23/15 1821  01/25/15 0655 01/25/15 1013 01/25/15 1555 01/26/15 0636 01/26/15 0940  WBC 5.5  --  3.6  --   --  3.6  --   HGB 12.9  < > 8.5* 9.7* 9.0* 8.6* 8.9*  HCT 39.2  --  25.8*  --   --  25.7*  --   PLT 204  --  155  --   --  150  --   MCV 93.3  --  93.8  --   --  93.7  --   MCH 30.8  --  30.8  --   --  31.1  --   MCHC 33.0  --  32.9  --   --  33.2  --   RDW 13.2  --  13.0  --   --  13.2  --   LYMPHSABS 1.6  --   --   --   --   --   --   MONOABS 0.6  --   --   --   --   --   --   EOSABS 0.2  --   --   --   --   --   --   BASOSABS 0.2*  --   --   --   --   --   --   < > = values in this interval not displayed.  Chemistries   Recent Labs Lab 01/23/15 1821 01/25/15 0655  NA 140 142  K 4.1 4.5  CL 105 114*  CO2 25 23  GLUCOSE 133* 101*  BUN 30* 21*  CREATININE 1.13* 1.02*  CALCIUM 9.7 8.8*  AST 32  --   ALT 15  --   ALKPHOS 87  --   BILITOT 0.6  --    ------------------------------------------------------------------------------------------------------------------ estimated creatinine clearance is 31.2 mL/min (by C-G formula based on Cr of 1.02). ------------------------------------------------------------------------------------------------------------------ No results for input(s): HGBA1C in the last 72 hours. ------------------------------------------------------------------------------------------------------------------ No results for input(s): CHOL, HDL, LDLCALC, TRIG, CHOLHDL, LDLDIRECT in the last 72  hours. ------------------------------------------------------------------------------------------------------------------ No results for input(s): TSH, T4TOTAL, T3FREE, THYROIDAB in the last 72 hours.  Invalid input(s): FREET3 ------------------------------------------------------------------------------------------------------------------  Recent Labs  01/24/15 0954  TIBC 306  IRON 116    Coagulation profile  Recent Labs Lab 01/23/15 1821  INR 1.12    No results for input(s): DDIMER in the last 72 hours.  Cardiac Enzymes No results for input(s): CKMB, TROPONINI, MYOGLOBIN in the last 168 hours.  Invalid input(s): CK ------------------------------------------------------------------------------------------------------------------ Invalid input(s): [place]    1. GI Bleed:  Due to diverticular in nature, continue to monitor hemoglobin. Bleeding scan negative follow H&H likely monitor 24 more hours if no further bleeding can be discharged tomorrow  2. Acute Renal Failure: Suspect secondary from volume loss from blood.  3. HTN:  Continue losartan. As well as when necessary hydrocodone   4. Hypothyroidism: Continue Synthroid.      Code Status Orders        Start     Ordered   01/23/15 2206  Do not attempt resuscitation (DNR)   Continuous    Question Answer Comment  In the event of cardiac or respiratory ARREST Do not call a "code blue"   In the event of cardiac or respiratory ARREST Do not perform Intubation, CPR, defibrillation or ACLS   In the event of cardiac or  respiratory ARREST Use medication by any route, position, wound care, and other measures to relive pain and suffering. May use oxygen, suction and manual treatment of airway obstruction as needed for comfort.      01/23/15 2205           Consults GI  DVT Prophylaxis   SCDs   Lab Results  Component Value Date   PLT 150 01/26/2015     Time Spent in minutes    8min Layni Kreamer M.D on 01/26/2015 at 12:47 PM  Between 7am to 6pm - Pager - [phone]  After 6pm go to [email or web address] - password [email or web address] [facility]   Office  [phone]

## 2015-01-26 NOTE — Consult Note (Signed)
Subjective: Patient seen for  Hematochezia.  Patient has had no further evidence of bleeding overnight.  She has had no bowel movement since yesterday.  She has been hemodynamically stable and hemoglobin has been stable as well.  She denies any nausea or abdominal pain.  Objective: Vital signs in last 24 hours: Temp:  [97.6 F (36.4 C)-98.2 F (36.8 C)] 98 F (36.7 C) (08/31 1545) Pulse Rate:  [62-72] 68 (08/31 1545) Resp:  [16-18] 16 (08/31 1545) BP: (138-173)/(49-99) 138/99 mmHg (08/31 1545) SpO2:  [98 %-100 %] 98 % (08/31 1545) Blood pressure 138/99, pulse 68, temperature 98 F (36.7 C), temperature source Oral, resp. rate 16, height 5\' 6"  (1.676 m), weight 57.425 kg (126 lb 9.6 oz), SpO2 98 %.   Intake/Output from previous day: 08/30 0701 - 08/31 0700 In: 1425 [P.O.:1000; I.V.:425] Out: 2250 [Urine:2250]  Intake/Output this shift: Total I/O In: 1300 [P.O.:1300] Out: -    General appearance:    Well appearing 81 year old female  No acute distress Resp: clear to auscultation Cardio:   Regular rate and rhythm GI:   Soft nontender nondistended bowel sounds positive normoactive Extremities:   No clubbing cyanosis or edema   Lab Results: Results for orders placed or performed during the hospital encounter of 01/23/15 (from the past 24 hour(s))  CBC     Status: Abnormal   Collection Time: 01/26/15  6:36 AM  Result Value Ref Range   WBC 3.6 3.6 - 11.0 K/uL   RBC 2.75 (L) 3.80 - 5.20 MIL/uL   Hemoglobin 8.6 (L) 12.0 - 16.0 g/dL   HCT 25.7 (L) 35.0 - 47.0 %   MCV 93.7 80.0 - 100.0 fL   MCH 31.1 26.0 - 34.0 pg   MCHC 33.2 32.0 - 36.0 g/dL   RDW 13.2 11.5 - 14.5 %   Platelets 150 150 - 440 K/uL  Hemoglobin     Status: Abnormal   Collection Time: 01/26/15  9:40 AM  Result Value Ref Range   Hemoglobin 8.9 (L) 12.0 - 16.0 g/dL  Hemoglobin     Status: Abnormal   Collection Time: 01/26/15  3:31 PM  Result Value Ref Range   Hemoglobin 8.8 (L) 12.0 - 16.0 g/dL      Recent  Labs  01/23/15 1821  01/25/15 0655  01/26/15 0636 01/26/15 0940 01/26/15 1531  WBC 5.5  --  3.6  --  3.6  --   --   HGB 12.9  < > 8.5*  < > 8.6* 8.9* 8.8*  HCT 39.2  --  25.8*  --  25.7*  --   --   PLT 204  --  155  --  150  --   --   < > = values in this interval not displayed. BMET  Recent Labs  01/23/15 1821 01/25/15 0655  NA 140 142  K 4.1 4.5  CL 105 114*  CO2 25 23  GLUCOSE 133* 101*  BUN 30* 21*  CREATININE 1.13* 1.02*  CALCIUM 9.7 8.8*   LFT  Recent Labs  01/23/15 1821  PROT 7.5  ALBUMIN 3.9  AST 32  ALT 15  ALKPHOS 87  BILITOT 0.6   PT/INR  Recent Labs  01/23/15 1821  LABPROT 14.6  INR 1.12   Hepatitis Panel No results for input(s): HEPBSAG, HCVAB, HEPAIGM, HEPBIGM in the last 72 hours. C-Diff No results for input(s): CDIFFTOX in the last 72 hours. No results for input(s): CDIFFPCR in the last 72 hours.   Studies/Results: No results  found.  Scheduled Inpatient Medications:   . calcium-vitamin D  1 tablet Oral Daily  . omega-3 acid ethyl esters  1 g Oral Daily   And  . cholecalciferol  1,000 Units Oral Daily  . fenofibrate  54 mg Oral Daily  . levothyroxine  150 mcg Oral QAC breakfast  . losartan  100 mg Oral Daily  . pravastatin  40 mg Oral Daily    Continuous Inpatient Infusions:     PRN Inpatient Medications:  acetaminophen **OR** acetaminophen, hydrALAZINE  Miscellaneous:   Assessment:  1. Hematochezia.  Likely diverticular bleed versus anal outlet such as hemorrhoidal or fissure.  Stable hemodynamically as well as by labs.  No indication of current bleeding.  Plan:  1.  Continue observation 2. Will advance diet to full liquids 3. Will need outpatient GI follow-up.  CT scan may be indicated to rule out larger colonic lesions. 4. Discussed with Dr. Rosezena Sensor MD 01/26/2015, 4:49 PM

## 2015-01-27 ENCOUNTER — Encounter: Payer: Self-pay | Admitting: Radiology

## 2015-01-27 ENCOUNTER — Inpatient Hospital Stay: Payer: Medicare Other

## 2015-01-27 LAB — CBC
HCT: 26.6 % — ABNORMAL LOW (ref 35.0–47.0)
Hemoglobin: 8.8 g/dL — ABNORMAL LOW (ref 12.0–16.0)
MCH: 31.4 pg (ref 26.0–34.0)
MCHC: 33 g/dL (ref 32.0–36.0)
MCV: 95.2 fL (ref 80.0–100.0)
PLATELETS: 155 10*3/uL (ref 150–440)
RBC: 2.79 MIL/uL — ABNORMAL LOW (ref 3.80–5.20)
RDW: 13.4 % (ref 11.5–14.5)
WBC: 4.2 10*3/uL (ref 3.6–11.0)

## 2015-01-27 LAB — HEMOGLOBIN: Hemoglobin: 9.4 g/dL — ABNORMAL LOW (ref 12.0–16.0)

## 2015-01-27 MED ORDER — IOHEXOL 300 MG/ML  SOLN
75.0000 mL | Freq: Once | INTRAMUSCULAR | Status: AC | PRN
  Administered 2015-01-27: 75 mL via INTRAVENOUS

## 2015-01-27 MED ORDER — IOHEXOL 240 MG/ML SOLN
25.0000 mL | INTRAMUSCULAR | Status: AC
  Administered 2015-01-27 (×2): 25 mL via ORAL

## 2015-01-27 MED ORDER — INFLUENZA VAC SPLIT QUAD 0.5 ML IM SUSY
0.5000 mL | PREFILLED_SYRINGE | INTRAMUSCULAR | Status: AC
  Administered 2015-01-28: 0.5 mL via INTRAMUSCULAR
  Filled 2015-01-27 (×2): qty 0.5

## 2015-01-27 NOTE — Care Management Important Message (Signed)
Important Message  Patient Details  Name: TUNYA JOAQUIN MRN: [id number] Date of Birth: 1919-01-07   Medicare Important Message Given:  Yes-third notification given    Juliann Pulse A Allmond 01/27/2015, 1:56 PM

## 2015-01-27 NOTE — Progress Notes (Signed)
[place] at [facility]                                                                                                                                                                                            Patient Demographics   Ashley [name], is a 81 y.o. female, DOB - [birth year], [id number]  Admit date - 01/23/2015   Admitting Physician [name], MD  Outpatient Primary MD for the patient is [name], MD   LOS - 4  Subjective:  Patient another episode of bright red blood earlier this morning.  Review of Systems:   CONSTITUTIONAL: No documented fever. No fatigue, weakness. No weight gain, no weight loss.  EYES: No blurry or double vision.  ENT: No tinnitus. No postnasal drip. No redness of the oropharynx.  RESPIRATORY: No cough, no wheeze, no hemoptysis. No dyspnea.  CARDIOVASCULAR: No chest pain. No orthopnea. No palpitations. No syncope.  GASTROINTESTINAL: No nausea, no vomiting or diarrhea. No abdominal pain.  Positive bright red blood per rectum GENITOURINARY: No dysuria or hematuria.  ENDOCRINE: No polyuria or nocturia. No heat or cold intolerance.  HEMATOLOGY: No anemia. No bruising. No bleeding.  INTEGUMENTARY: No rashes. No lesions.  MUSCULOSKELETAL: No arthritis. No swelling. No gout.  NEUROLOGIC: No numbness, tingling, or ataxia. No seizure-type activity.  PSYCHIATRIC: No anxiety. No insomnia. No ADD.    Vitals:   Filed Vitals:   01/26/15 1545 01/26/15 2014 01/27/15 0310 01/27/15 0752  BP: 138/99 138/58 149/67 158/61  Pulse: 68 66 70 62  Temp: 98 F (36.7 C) 98 F (36.7 C) 98.2 F (36.8 C) 97.9 F (36.6 C)  TempSrc: Oral Oral Oral Oral  Resp: 16 17 17 16   Height:      Weight:      SpO2: 98% 99% 100% 98%    Wt Readings from Last 3 Encounters:  01/23/15 57.425 kg (126 lb 9.6 oz)  07/31/13 104.327 kg (230 lb)     Intake/Output Summary (Last 24 hours) at 01/27/15 1248 Last data filed at 01/27/15  1214  Gross per 24 hour  Intake   2680 ml  Output    400 ml  Net   2280 ml    Physical Exam:   GENERAL: Pleasant-appearing in no apparent distress.  HEAD, EYES, EARS, NOSE AND THROAT: Atraumatic, normocephalic. Extraocular muscles are intact. Pupils equal and reactive to light. Sclerae anicteric. No conjunctival injection. No oro-pharyngeal erythema.  NECK: Supple. There is no jugular venous distention. No bruits, no lymphadenopathy, no thyromegaly.  HEART: Regular rate and rhythm,. No murmurs, no rubs, no clicks.  LUNGS: Clear  to auscultation bilaterally. No rales or rhonchi. No wheezes.  ABDOMEN: Soft, flat, nontender, nondistended. Has good bowel sounds. No hepatosplenomegaly appreciated.  EXTREMITIES: No evidence of any cyanosis, clubbing, or peripheral edema.  +2 pedal and radial pulses bilaterally.  NEUROLOGIC: The patient is alert, awake, and oriented x3 with no focal motor or sensory deficits appreciated bilaterally.  SKIN: Moist and warm with no rashes appreciated.  Psych: Not anxious, depressed LN: No inguinal LN enlargement    Antibiotics   Anti-infectives    None      Medications   Scheduled Meds: . calcium-vitamin D  1 tablet Oral Daily  . omega-3 acid ethyl esters  1 g Oral Daily   And  . cholecalciferol  1,000 Units Oral Daily  . fenofibrate  54 mg Oral Daily  . [START ON 01/28/2015] Influenza vac split quadrivalent PF  0.5 mL Intramuscular Tomorrow-1000  . levothyroxine  150 mcg Oral QAC breakfast  . losartan  100 mg Oral Daily  . pravastatin  40 mg Oral Daily   Continuous Infusions:   PRN Meds:.acetaminophen **OR** acetaminophen, hydrALAZINE   Data Review:   Micro Results No results found for this or any previous visit (from the past 240 hour(s)).  Radiology Reports Nm Gi Blood Loss  01/24/2015   CLINICAL DATA:  Bloody stools for 2 days.  EXAM: NUCLEAR MEDICINE GASTROINTESTINAL BLEEDING SCAN  TECHNIQUE: Sequential abdominal images were obtained  following intravenous administration of Tc-60m labeled red blood cells.  RADIOPHARMACEUTICALS:  21.3 mCi Tc-74m in-vitro labeled red cells.  COMPARISON:  CT scan 01/23/2015  FINDINGS: No active bleeding site is identified.  IMPRESSION: No active bleeding site is identified.   Electronically Signed   By: Marijo Sanes M.D.   On: 01/24/2015 15:08   Ct Abdomen Pelvis W Contrast  01/23/2015   CLINICAL DATA:  Rectal bleeding beginning today.  EXAM: CT ABDOMEN AND PELVIS WITH CONTRAST  TECHNIQUE: Multidetector CT imaging of the abdomen and pelvis was performed using the standard protocol following bolus administration of intravenous contrast.  CONTRAST:  21mL OMNIPAQUE IOHEXOL 240 MG/ML SOLN, 39mL OMNIPAQUE IOHEXOL 300 MG/ML SOLN  COMPARISON:  None.  FINDINGS: Lung bases demonstrate subtle peripheral increased interstitial markings no consolidation or effusion. Borderline cardiomegaly.  Abdominal images demonstrate subtle nodular contour to the liver. Small calcified granuloma over the right lobe of the liver. The spleen, pancreas, gallbladder and adrenal glands are within normal. The appendix is not visualized.  Kidneys normal in size with no hydronephrosis or nephrolithiasis. There is a sub cm hypodensity over the upper pole of the right renal cortex too small to characterize but likely a cyst. Ureters are within normal.  There is mild-to-moderate calcified plaque over the abdominal aorta and iliac arteries.  Mesentery is within normal.  Small bowel is unremarkable.  There is diverticulosis of the colon most prominent over the sigmoid colon without active inflammation.  Pelvic images demonstrate the bladder, uterus and rectum to be within normal. Adnexal regions are unremarkable.  There are moderate degenerative changes of the spine with multilevel disc disease over the lumbar spine. There are mild degenerative changes of the hips  IMPRESSION: No acute findings in the abdomen/ pelvis.  Moderate diverticulosis of the  sigmoid colon without active inflammation.  Sub cm right renal cortical hypodensity too small to characterize, but likely a cyst.  Mild cardiomegaly. Subtle peripheral increased interstitial markings over the lung bases.  Moderate degenerative changes spine with multilevel disc disease over the lumbar spine.   Electronically  Signed   By: Marin Olp M.D.   On: 01/23/2015 20:01     CBC  Recent Labs Lab 01/23/15 1821  01/25/15 0655  01/26/15 0636 01/26/15 0940 01/26/15 1531 01/26/15 2152 01/27/15 0613 01/27/15 1009  WBC 5.5  --  3.6  --  3.6  --   --   --  4.2  --   HGB 12.9  < > 8.5*  < > 8.6* 8.9* 8.8* 8.3* 8.8* 9.4*  HCT 39.2  --  25.8*  --  25.7*  --   --   --  26.6*  --   PLT 204  --  155  --  150  --   --   --  155  --   MCV 93.3  --  93.8  --  93.7  --   --   --  95.2  --   MCH 30.8  --  30.8  --  31.1  --   --   --  31.4  --   MCHC 33.0  --  32.9  --  33.2  --   --   --  33.0  --   RDW 13.2  --  13.0  --  13.2  --   --   --  13.4  --   LYMPHSABS 1.6  --   --   --   --   --   --   --   --   --   MONOABS 0.6  --   --   --   --   --   --   --   --   --   EOSABS 0.2  --   --   --   --   --   --   --   --   --   BASOSABS 0.2*  --   --   --   --   --   --   --   --   --   < > = values in this interval not displayed.  Chemistries   Recent Labs Lab 01/23/15 1821 01/25/15 0655  NA 140 142  K 4.1 4.5  CL 105 114*  CO2 25 23  GLUCOSE 133* 101*  BUN 30* 21*  CREATININE 1.13* 1.02*  CALCIUM 9.7 8.8*  AST 32  --   ALT 15  --   ALKPHOS 87  --   BILITOT 0.6  --    ------------------------------------------------------------------------------------------------------------------ estimated creatinine clearance is 31.2 mL/min (by C-G formula based on Cr of 1.02). ------------------------------------------------------------------------------------------------------------------ No results for input(s): HGBA1C in the last 72  hours. ------------------------------------------------------------------------------------------------------------------ No results for input(s): CHOL, HDL, LDLCALC, TRIG, CHOLHDL, LDLDIRECT in the last 72 hours. ------------------------------------------------------------------------------------------------------------------ No results for input(s): TSH, T4TOTAL, T3FREE, THYROIDAB in the last 72 hours.  Invalid input(s): FREET3 ------------------------------------------------------------------------------------------------------------------ No results for input(s): VITAMINB12, FOLATE, FERRITIN, TIBC, IRON, RETICCTPCT in the last 72 hours.  Coagulation profile  Recent Labs Lab 01/23/15 1821  INR 1.12    No results for input(s): DDIMER in the last 72 hours.  Cardiac Enzymes No results for input(s): CKMB, TROPONINI, MYOGLOBIN in the last 168 hours.  Invalid input(s): CK ------------------------------------------------------------------------------------------------------------------ Invalid input(s): [place]    1. GI Bleed:  Due to diverticular in nature, continue to monitor hemoglobin. Bleeding scan negative follow H&H likely  CT of the abdomen and pelvis ordered to rule out a major colonic lesion according to GI if bleeding persists and may need  sigmoidoscopy. 2. Acute Renal Failure: Suspect secondary from volume loss from blood.  3. HTN:  Continue losartan. As well as when necessary hydrocodone   4. Hypothyroidism: Continue Synthroid.      Code Status Orders        Start     Ordered   01/23/15 2206  Do not attempt resuscitation (DNR)   Continuous    Question Answer Comment  In the event of cardiac or respiratory ARREST Do not call a "code blue"   In the event of cardiac or respiratory ARREST Do not perform Intubation, CPR, defibrillation or ACLS   In the event of cardiac or respiratory ARREST Use medication by any route, position, wound care,  and other measures to relive pain and suffering. May use oxygen, suction and manual treatment of airway obstruction as needed for comfort.      01/23/15 2205           Consults GI  DVT Prophylaxis   SCDs   Lab Results  Component Value Date   PLT 155 01/27/2015     Time Spent in minutes  25 minutes Dustin Flock M.D on 01/27/2015 at 12:48 PM  Between 7am to 6pm - Pager - [phone]  After 6pm go to [email or web address] - password [email or web address] [facility]   Office  [phone]

## 2015-01-27 NOTE — Progress Notes (Signed)
Alert and oriented. VSS. No c/o pain. No active bleeding noted. No bowel movement. Assist up to bathroom with stand-by assist. Resting quietly.

## 2015-01-27 NOTE — Consult Note (Signed)
Subjective: Patient seen for  Hematochezia.One bowel movement in the past 36 hours.  Recorded is soft and red small.  There is no abdominal pain or nausea.  She is tolerating a full liquid diet.  Objective: Vital signs in last 24 hours: Temp:  [97.9 F (36.6 C)-98.2 F (36.8 C)] 97.9 F (36.6 C) (09/01 0752) Pulse Rate:  [62-70] 62 (09/01 0752) Resp:  [16-17] 16 (09/01 0752) BP: (138-158)/(49-99) 158/61 mmHg (09/01 0752) SpO2:  [98 %-100 %] 98 % (09/01 0752) Blood pressure 158/61, pulse 62, temperature 97.9 F (36.6 C), temperature source Oral, resp. rate 16, height 5\' 6"  (1.676 m), weight 57.425 kg (126 lb 9.6 oz), SpO2 98 %.   Intake/Output from previous day: 08/31 0701 - 09/01 0700 In: 2060 [P.O.:2060] Out: -   Intake/Output this shift: Total I/O In: 760 [P.O.:760] Out: -    General appearance:    81 year old female no acute distress Resp:   Bilaterally clear to auscultation Cardio:   Regular rate and rhythm  GI:   Soft nontender nondistended bowel sounds positive normoactive Extremities:   No clubbing cyanosis or edema   Lab Results: Results for orders placed or performed during the hospital encounter of 01/23/15 (from the past 24 hour(s))  Hemoglobin     Status: Abnormal   Collection Time: 01/26/15  3:31 PM  Result Value Ref Range   Hemoglobin 8.8 (L) 12.0 - 16.0 g/dL  Hemoglobin     Status: Abnormal   Collection Time: 01/26/15  9:52 PM  Result Value Ref Range   Hemoglobin 8.3 (L) 12.0 - 16.0 g/dL  CBC     Status: Abnormal   Collection Time: 01/27/15  6:13 AM  Result Value Ref Range   WBC 4.2 3.6 - 11.0 K/uL   RBC 2.79 (L) 3.80 - 5.20 MIL/uL   Hemoglobin 8.8 (L) 12.0 - 16.0 g/dL   HCT 26.6 (L) 35.0 - 47.0 %   MCV 95.2 80.0 - 100.0 fL   MCH 31.4 26.0 - 34.0 pg   MCHC 33.0 32.0 - 36.0 g/dL   RDW 13.4 11.5 - 14.5 %   Platelets 155 150 - 440 K/uL  Hemoglobin     Status: Abnormal   Collection Time: 01/27/15 10:09 AM  Result Value Ref Range   Hemoglobin 9.4  (L) 12.0 - 16.0 g/dL      Recent Labs  01/25/15 0655  01/26/15 0636  01/26/15 2152 01/27/15 0613 01/27/15 1009  WBC 3.6  --  3.6  --   --  4.2  --   HGB 8.5*  < > 8.6*  < > 8.3* 8.8* 9.4*  HCT 25.8*  --  25.7*  --   --  26.6*  --   PLT 155  --  150  --   --  155  --   < > = values in this interval not displayed. BMET  Recent Labs  01/25/15 0655  NA 142  K 4.5  CL 114*  CO2 23  GLUCOSE 101*  BUN 21*  CREATININE 1.02*  CALCIUM 8.8*   LFT No results for input(s): PROT, ALBUMIN, AST, ALT, ALKPHOS, BILITOT, BILIDIR, IBILI in the last 72 hours. PT/INR No results for input(s): LABPROT, INR in the last 72 hours. Hepatitis Panel No results for input(s): HEPBSAG, HCVAB, HEPAIGM, HEPBIGM in the last 72 hours. C-Diff No results for input(s): CDIFFTOX in the last 72 hours. No results for input(s): CDIFFPCR in the last 72 hours.   Studies/Results: No results found.  Scheduled  Inpatient Medications:   . calcium-vitamin D  1 tablet Oral Daily  . omega-3 acid ethyl esters  1 g Oral Daily   And  . cholecalciferol  1,000 Units Oral Daily  . fenofibrate  54 mg Oral Daily  . levothyroxine  150 mcg Oral QAC breakfast  . losartan  100 mg Oral Daily  . pravastatin  40 mg Oral Daily    Continuous Inpatient Infusions:     PRN Inpatient Medications:  acetaminophen **OR** acetaminophen, hydrALAZINE  Miscellaneous:   Assessment:   1. Hematochezia likely diverticular bleeding versus outlet source such as hemorrhoids.  Hemodynamically stable.  Stable hemoglobin.  Plan:    1.  Continue to advance diet.  Would observe through tomorrow.  Awaiting CT scan  To rule out major lesions.  Lollie Sails MD 01/27/2015, 11:06 AM

## 2015-01-27 NOTE — Plan of Care (Signed)
Patient's nephew called to speak with RN about patient's condition, progress and POC. Pt gave permission to speak over phone about care and condition.  Nephew listened but insisted that [facility] was not able to care appropriately for patient and she needs to be transferred to [facility].  RN stated we have no trouble persuing the family's wishes - however we'd first need to discuss this with the patient (since alert/oriented)and Dr. Alanson Puls stated "but she's 44." RN stated, "but that doesn't mean she's not alert/oriented." Nephew appeared frustrated and indicated he'd be coming to the hospital. Patient informed.

## 2015-01-28 ENCOUNTER — Encounter
Admission: RE | Admit: 2015-01-28 | Discharge: 2015-01-28 | Disposition: A | Discharge status: Home / Self Care | Payer: Medicare Other | Source: Ambulatory Visit | Attending: Internal Medicine | Admitting: Internal Medicine

## 2015-01-28 DIAGNOSIS — K5731 Diverticulosis of large intestine without perforation or abscess with bleeding: Secondary | ICD-10-CM | POA: Diagnosis not present

## 2015-01-28 LAB — CBC
HCT: 27.2 % — ABNORMAL LOW (ref 35.0–47.0)
HEMOGLOBIN: 9.1 g/dL — AB (ref 12.0–16.0)
MCH: 31.3 pg (ref 26.0–34.0)
MCHC: 33.4 g/dL (ref 32.0–36.0)
MCV: 93.6 fL (ref 80.0–100.0)
Platelets: 178 10*3/uL (ref 150–440)
RBC: 2.91 MIL/uL — AB (ref 3.80–5.20)
RDW: 13.2 % (ref 11.5–14.5)
WBC: 4.2 10*3/uL (ref 3.6–11.0)

## 2015-01-28 NOTE — Progress Notes (Signed)
Plan is for patient to D/C to [facility] Saturday 01/29/15. Per Kim admissions coordinator at [facility] patient is going to room 351. RN will call report at [phone]. Clinical Education officer, museum (CSW) sent D/C Summary to [employer] today via carefinder. Patient is aware of above. CSW will continue to follow and assist as needed.   Blima Rich, [place] [phone]

## 2015-01-28 NOTE — Consult Note (Signed)
Subjective: Patient seen for hematochezia. Patient is doing well. She had a bowel movement earlier today that was mostly brown. He denies any nausea or abdominal pain. She is currently tolerating a full liquid diet.  Objective: Vital signs in last 24 hours: Temp:  [97.6 F (36.4 C)-98.2 F (36.8 C)] 98 F (36.7 C) (09/02 1542) Pulse Rate:  [68-81] 68 (09/02 1542) Resp:  [16-18] 18 (09/02 1542) BP: (119-148)/(61-66) 140/63 mmHg (09/02 1542) SpO2:  [98 %-100 %] 100 % (09/02 1542) Blood pressure 140/63, pulse 68, temperature 98 F (36.7 C), temperature source Oral, resp. rate 18, height 5\' 6"  (1.676 m), weight 57.425 kg (126 lb 9.6 oz), SpO2 100 %.   Intake/Output from previous day: 09/01 0701 - 09/02 0700 In: 1160 [P.O.:1160] Out: 1648 [Urine:1648]  Intake/Output this shift: Total I/O In: 2345 [P.O.:2345] Out: 300 [Urine:300]   General appearance:  81 year old female no acute distress Resp:  Clear to auscultation Cardio:  Regular rate and rhythm GI:  Soft nontender nondistended bowel sounds positive normoactive Extremities:  No clubbing cyanosis or edema   Lab Results: Results for orders placed or performed during the hospital encounter of 01/23/15 (from the past 24 hour(s))  CBC     Status: Abnormal   Collection Time: 01/28/15  6:00 AM  Result Value Ref Range   WBC 4.2 3.6 - 11.0 K/uL   RBC 2.91 (L) 3.80 - 5.20 MIL/uL   Hemoglobin 9.1 (L) 12.0 - 16.0 g/dL   HCT 27.2 (L) 35.0 - 47.0 %   MCV 93.6 80.0 - 100.0 fL   MCH 31.3 26.0 - 34.0 pg   MCHC 33.4 32.0 - 36.0 g/dL   RDW 13.2 11.5 - 14.5 %   Platelets 178 150 - 440 K/uL      Recent Labs  01/26/15 0636  01/27/15 0613 01/27/15 1009 01/28/15 0600  WBC 3.6  --  4.2  --  4.2  HGB 8.6*  < > 8.8* 9.4* 9.1*  HCT 25.7*  --  26.6*  --  27.2*  PLT 150  --  155  --  178  < > = values in this interval not displayed. BMET No results for input(s): NA, K, CL, CO2, GLUCOSE, BUN, CREATININE, CALCIUM in the last 72  hours. LFT No results for input(s): PROT, ALBUMIN, AST, ALT, ALKPHOS, BILITOT, BILIDIR, IBILI in the last 72 hours. PT/INR No results for input(s): LABPROT, INR in the last 72 hours. Hepatitis Panel No results for input(s): HEPBSAG, HCVAB, HEPAIGM, HEPBIGM in the last 72 hours. C-Diff No results for input(s): CDIFFTOX in the last 72 hours. No results for input(s): CDIFFPCR in the last 72 hours.   Studies/Results: Ct Abdomen Pelvis W Contrast  01/27/2015   CLINICAL DATA:  Rectal bleeding since Sunday. History skin cancer. Diverticulitis.  EXAM: CT ABDOMEN AND PELVIS WITH CONTRAST  TECHNIQUE: Multidetector CT imaging of the abdomen and pelvis was performed using the standard protocol following bolus administration of intravenous contrast.  CONTRAST:  2mL OMNIPAQUE IOHEXOL 300 MG/ML  SOLN  COMPARISON:  01/23/2015  FINDINGS: Lower chest: Clear lung bases. Cardiomegaly, accentuated by a pectus excavatum deformity. Right coronary artery atherosclerosis. Fluid level in the esophagus on image 7.  Hepatobiliary: Moderate cirrhosis, without focal liver lesion Normal gallbladder, without biliary ductal dilatation.  Pancreas: Moderate pancreatic atrophy, without duct dilatation or focal mass.  Spleen: Normal  Adrenals/Urinary Tract: Normal adrenal glands. Mild renal cortical thinning bilaterally. Subcentimeter right renal lesion is likely a cyst. Normal left kidney. No hydronephrosis. Normal  urinary bladder.  Stomach/Bowel: Normal stomach, without wall thickening. Scattered colonic diverticula. Normal terminal ileum. Normal small bowel.  Vascular/Lymphatic: Advanced aortic and branch vessel atherosclerosis. Celiac narrowing with mild poststenotic dilatation on sagittal image 99. Patent portal vein. Hepatic veins not well evaluated. No abdominopelvic adenopathy.  Reproductive: Normal uterus and adnexa.  Other: No significant free fluid. Mild-to-moderate pelvic floor laxity.  Musculoskeletal: Moderate osteopenia.  Advanced lumbosacral spondylosis.  IMPRESSION: 1.  No acute process or explanation for rectal bleeding. 2. Moderate cirrhosis, without specific findings of portal venous hypertension. 3. Esophageal air fluid level suggests dysmotility or gastroesophageal reflux. 4.  Atherosclerosis, including within the coronary arteries. 5. Pelvic floor laxity.   Electronically Signed   By: Abigail Miyamoto M.D.   On: 01/27/2015 13:08    Scheduled Inpatient Medications:   . calcium-vitamin D  1 tablet Oral Daily  . omega-3 acid ethyl esters  1 g Oral Daily   And  . cholecalciferol  1,000 Units Oral Daily  . fenofibrate  54 mg Oral Daily  . levothyroxine  150 mcg Oral QAC breakfast  . losartan  100 mg Oral Daily  . pravastatin  40 mg Oral Daily    Continuous Inpatient Infusions:     PRN Inpatient Medications:  acetaminophen **OR** acetaminophen, hydrALAZINE  Miscellaneous:   Assessment:  1. Hematochezia. The diverticular bleeding chest which has stopped.. Digital Rectal examination was only a light brown stool. There were no other lesions noted.  Plan:  1. Would advance diet to low residue tomorrow and continue for 4 or 5 days. If she has any problems with constipation I would consider using MiraLAX daily. She will need a patient follow-up with GI in regards to this issue as well as the finding of cirrhosis on CT scan.  Dr. Candace Cruise is rounding over the weekend if needed.  Lollie Sails MD 01/28/2015, 5:51 PM

## 2015-01-28 NOTE — Clinical Social Work Placement (Signed)
   CLINICAL SOCIAL WORK PLACEMENT  NOTE  Date:  01/28/2015  Patient Details  Name: Ashley [name] MRN: [id number] Date of Birth: 01-May-1919  Clinical Social Work is seeking post-discharge placement for this patient at the [place] level of care (*CSW will initial, date and re-position this form in  chart as items are completed):  Yes   Patient/family provided with [facility] Work Department's list of facilities offering this level of care within the geographic area requested by the patient (or if unable, by the patient's family).  Yes   Patient/family informed of their freedom to choose among providers that offer the needed level of care, that participate in Medicare, Medicaid or managed care program needed by the patient, have an available bed and are willing to accept the patient.  Yes   Patient/family informed of [facility]'s ownership interest in [facility] and [facility], as well as of the fact that they are under no obligation to receive care at these facilities.  PASRR submitted to EDS on 01/28/15     PASRR number received on 01/28/15     Existing PASRR number confirmed on       FL2 transmitted to all facilities in geographic area requested by pt/family on 01/28/15     FL2 transmitted to all facilities within larger geographic area on       Patient informed that his/her managed care company has contracts with or will negotiate with certain facilities, including the following:        Yes   Patient/family informed of bed offers received.  Patient chooses bed at  [facility] )     Physician recommends and patient chooses bed at      Patient to be transferred to   on  .  Patient to be transferred to facility by       Patient family notified on   of transfer.  Name of family member notified:        PHYSICIAN Please sign FL2, Please sign DNR     Additional Comment:     _______________________________________________ Loralyn Freshwater, LCSW 01/28/2015, 10:07 AM

## 2015-01-28 NOTE — Progress Notes (Signed)
[place] at [facility]                                                                                                                                                                                            Patient Demographics   Ashley [name], is a 81 y.o. female, DOB - 20-Jul-1918, [id number]  Admit date - 01/23/2015   Admitting Physician [name], MD  Outpatient Primary MD for the patient is [name], MD   LOS - 5  Subjective: She denies any bloody diarrhea since yesterday morning. No abdominal pain. Review of Systems:   CONSTITUTIONAL: No documented fever. No fatigue, weakness. No weight gain, no weight loss.  EYES: No blurry or double vision.  ENT: No tinnitus. No postnasal drip. No redness of the oropharynx.  RESPIRATORY: No cough, no wheeze, no hemoptysis. No dyspnea.  CARDIOVASCULAR: No chest pain. No orthopnea. No palpitations. No syncope.  GASTROINTESTINAL: No nausea, no vomiting or diarrhea. No abdominal pain.  Positive bright red blood per rectum GENITOURINARY: No dysuria or hematuria.  ENDOCRINE: No polyuria or nocturia. No heat or cold intolerance.  HEMATOLOGY: No anemia. No bruising. No bleeding.  INTEGUMENTARY: No rashes. No lesions.  MUSCULOSKELETAL: No arthritis. No swelling. No gout.  NEUROLOGIC: No numbness, tingling, or ataxia. No seizure-type activity.  PSYCHIATRIC: No anxiety. No insomnia. No ADD.    Vitals:   Filed Vitals:   01/27/15 1546 01/27/15 1941 01/28/15 0447 01/28/15 0746  BP: 178/66 143/61 119/62 148/66  Pulse: 63 81 70 78  Temp: 97.5 F (36.4 C) 98.2 F (36.8 C) 97.8 F (36.6 C) 97.6 F (36.4 C)  TempSrc: Oral Oral Oral Oral  Resp: 16 18 18 16   Height:      Weight:      SpO2: 89% 98% 99% 99%    Wt Readings from Last 3 Encounters:  01/23/15 57.425 kg (126 lb 9.6 oz)  07/31/13 104.327 kg (230 lb)     Intake/Output Summary (Last 24 hours) at 01/28/15 0820 Last data filed at  01/28/15 0300  Gross per 24 hour  Intake   1160 ml  Output   1648 ml  Net   -488 ml    Physical Exam:   GENERAL: Pleasant-appearing in no apparent distress.  HEAD, EYES, EARS, NOSE AND THROAT: Atraumatic, normocephalic. Extraocular muscles are intact. Pupils equal and reactive to light. Sclerae anicteric. No conjunctival injection. No oro-pharyngeal erythema.  NECK: Supple. There is no jugular venous distention. No bruits, no lymphadenopathy, no thyromegaly.  HEART: Regular rate and rhythm,. No murmurs, no rubs, no clicks.  LUNGS: Clear to auscultation  bilaterally. No rales or rhonchi. No wheezes.  ABDOMEN: Soft, flat, nontender, nondistended. Has good bowel sounds. No hepatosplenomegaly appreciated.  EXTREMITIES: No evidence of any cyanosis, clubbing, or peripheral edema.  +2 pedal and radial pulses bilaterally.  NEUROLOGIC: The patient is alert, awake, and oriented x3 with no focal motor or sensory deficits appreciated bilaterally.  SKIN: Moist and warm with no rashes appreciated.  Psych: Not anxious, depressed LN: No inguinal LN enlargement    Antibiotics   Anti-infectives    None      Medications   Scheduled Meds: . calcium-vitamin D  1 tablet Oral Daily  . omega-3 acid ethyl esters  1 g Oral Daily   And  . cholecalciferol  1,000 Units Oral Daily  . fenofibrate  54 mg Oral Daily  . Influenza vac split quadrivalent PF  0.5 mL Intramuscular Tomorrow-1000  . levothyroxine  150 mcg Oral QAC breakfast  . losartan  100 mg Oral Daily  . pravastatin  40 mg Oral Daily   Continuous Infusions:   PRN Meds:.acetaminophen **OR** acetaminophen, hydrALAZINE   Data Review:   Micro Results No results found for this or any previous visit (from the past 240 hour(s)).  Radiology Reports Nm Gi Blood Loss  01/24/2015   CLINICAL DATA:  Bloody stools for 2 days.  EXAM: NUCLEAR MEDICINE GASTROINTESTINAL BLEEDING SCAN  TECHNIQUE: Sequential abdominal images were obtained following  intravenous administration of Tc-28m labeled red blood cells.  RADIOPHARMACEUTICALS:  21.3 mCi Tc-13m in-vitro labeled red cells.  COMPARISON:  CT scan 01/23/2015  FINDINGS: No active bleeding site is identified.  IMPRESSION: No active bleeding site is identified.   Electronically Signed   By: Marijo Sanes M.D.   On: 01/24/2015 15:08   Ct Abdomen Pelvis W Contrast  01/27/2015   CLINICAL DATA:  Rectal bleeding since Sunday. History skin cancer. Diverticulitis.  EXAM: CT ABDOMEN AND PELVIS WITH CONTRAST  TECHNIQUE: Multidetector CT imaging of the abdomen and pelvis was performed using the standard protocol following bolus administration of intravenous contrast.  CONTRAST:  45mL OMNIPAQUE IOHEXOL 300 MG/ML  SOLN  COMPARISON:  01/23/2015  FINDINGS: Lower chest: Clear lung bases. Cardiomegaly, accentuated by a pectus excavatum deformity. Right coronary artery atherosclerosis. Fluid level in the esophagus on image 7.  Hepatobiliary: Moderate cirrhosis, without focal liver lesion Normal gallbladder, without biliary ductal dilatation.  Pancreas: Moderate pancreatic atrophy, without duct dilatation or focal mass.  Spleen: Normal  Adrenals/Urinary Tract: Normal adrenal glands. Mild renal cortical thinning bilaterally. Subcentimeter right renal lesion is likely a cyst. Normal left kidney. No hydronephrosis. Normal urinary bladder.  Stomach/Bowel: Normal stomach, without wall thickening. Scattered colonic diverticula. Normal terminal ileum. Normal small bowel.  Vascular/Lymphatic: Advanced aortic and branch vessel atherosclerosis. Celiac narrowing with mild poststenotic dilatation on sagittal image 99. Patent portal vein. Hepatic veins not well evaluated. No abdominopelvic adenopathy.  Reproductive: Normal uterus and adnexa.  Other: No significant free fluid. Mild-to-moderate pelvic floor laxity.  Musculoskeletal: Moderate osteopenia. Advanced lumbosacral spondylosis.  IMPRESSION: 1.  No acute process or explanation for  rectal bleeding. 2. Moderate cirrhosis, without specific findings of portal venous hypertension. 3. Esophageal air fluid level suggests dysmotility or gastroesophageal reflux. 4.  Atherosclerosis, including within the coronary arteries. 5. Pelvic floor laxity.   Electronically Signed   By: Abigail Miyamoto M.D.   On: 01/27/2015 13:08   Ct Abdomen Pelvis W Contrast  01/23/2015   CLINICAL DATA:  Rectal bleeding beginning today.  EXAM: CT ABDOMEN AND PELVIS WITH CONTRAST  TECHNIQUE:  Multidetector CT imaging of the abdomen and pelvis was performed using the standard protocol following bolus administration of intravenous contrast.  CONTRAST:  77mL OMNIPAQUE IOHEXOL 240 MG/ML SOLN, 63mL OMNIPAQUE IOHEXOL 300 MG/ML SOLN  COMPARISON:  None.  FINDINGS: Lung bases demonstrate subtle peripheral increased interstitial markings no consolidation or effusion. Borderline cardiomegaly.  Abdominal images demonstrate subtle nodular contour to the liver. Small calcified granuloma over the right lobe of the liver. The spleen, pancreas, gallbladder and adrenal glands are within normal. The appendix is not visualized.  Kidneys normal in size with no hydronephrosis or nephrolithiasis. There is a sub cm hypodensity over the upper pole of the right renal cortex too small to characterize but likely a cyst. Ureters are within normal.  There is mild-to-moderate calcified plaque over the abdominal aorta and iliac arteries.  Mesentery is within normal.  Small bowel is unremarkable.  There is diverticulosis of the colon most prominent over the sigmoid colon without active inflammation.  Pelvic images demonstrate the bladder, uterus and rectum to be within normal. Adnexal regions are unremarkable.  There are moderate degenerative changes of the spine with multilevel disc disease over the lumbar spine. There are mild degenerative changes of the hips  IMPRESSION: No acute findings in the abdomen/ pelvis.  Moderate diverticulosis of the sigmoid colon  without active inflammation.  Sub cm right renal cortical hypodensity too small to characterize, but likely a cyst.  Mild cardiomegaly. Subtle peripheral increased interstitial markings over the lung bases.  Moderate degenerative changes spine with multilevel disc disease over the lumbar spine.   Electronically Signed   By: Marin Olp M.D.   On: 01/23/2015 20:01     CBC  Recent Labs Lab 01/23/15 1821  01/25/15 0655  01/26/15 0636  01/26/15 1531 01/26/15 2152 01/27/15 0613 01/27/15 1009 01/28/15 0600  WBC 5.5  --  3.6  --  3.6  --   --   --  4.2  --  4.2  HGB 12.9  < > 8.5*  < > 8.6*  < > 8.8* 8.3* 8.8* 9.4* 9.1*  HCT 39.2  --  25.8*  --  25.7*  --   --   --  26.6*  --  27.2*  PLT 204  --  155  --  150  --   --   --  155  --  178  MCV 93.3  --  93.8  --  93.7  --   --   --  95.2  --  93.6  MCH 30.8  --  30.8  --  31.1  --   --   --  31.4  --  31.3  MCHC 33.0  --  32.9  --  33.2  --   --   --  33.0  --  33.4  RDW 13.2  --  13.0  --  13.2  --   --   --  13.4  --  13.2  LYMPHSABS 1.6  --   --   --   --   --   --   --   --   --   --   MONOABS 0.6  --   --   --   --   --   --   --   --   --   --   EOSABS 0.2  --   --   --   --   --   --   --   --   --   --  BASOSABS 0.2*  --   --   --   --   --   --   --   --   --   --   < > = values in this interval not displayed.  Chemistries   Recent Labs Lab 01/23/15 1821 01/25/15 0655  NA 140 142  K 4.1 4.5  CL 105 114*  CO2 25 23  GLUCOSE 133* 101*  BUN 30* 21*  CREATININE 1.13* 1.02*  CALCIUM 9.7 8.8*  AST 32  --   ALT 15  --   ALKPHOS 87  --   BILITOT 0.6  --    ------------------------------------------------------------------------------------------------------------------ estimated creatinine clearance is 31.2 mL/min (by C-G formula based on Cr of 1.02). ------------------------------------------------------------------------------------------------------------------ No results for input(s): HGBA1C in the last 72  hours. ------------------------------------------------------------------------------------------------------------------ No results for input(s): CHOL, HDL, LDLCALC, TRIG, CHOLHDL, LDLDIRECT in the last 72 hours. ------------------------------------------------------------------------------------------------------------------ No results for input(s): TSH, T4TOTAL, T3FREE, THYROIDAB in the last 72 hours.  Invalid input(s): FREET3 ------------------------------------------------------------------------------------------------------------------ No results for input(s): VITAMINB12, FOLATE, FERRITIN, TIBC, IRON, RETICCTPCT in the last 72 hours.  Coagulation profile  Recent Labs Lab 01/23/15 1821  INR 1.12    No results for input(s): DDIMER in the last 72 hours.  Cardiac Enzymes No results for input(s): CKMB, TROPONINI, MYOGLOBIN in the last 168 hours.  Invalid input(s): CK ------------------------------------------------------------------------------------------------------------------ Invalid input(s): [place]olls    1. GI Bleed:  Due to diverticular in nature, continue to monitor hemoglobin. Bleeding scan negative follow H&H likely  CT of abdomen done yesterday did not show any colonic inflammation, it showed diverticulosis. Patient has no further rectal bleed since yesterday morning. We will advance her diet and see how she tolerates the diet. Hemoglobin is stable. 2. Acute Renal Failure: Suspect secondary from volume loss from blood. Improvement  3. HTN:  Continue losartan. As well as when necessary hydrocodone   4. Hypothyroidism: Continue Synthroid.  Likely discharge tomorrow.    Code Status Orders        Start     Ordered   01/23/15 2206  Do not attempt resuscitation (DNR)   Continuous    Question Answer Comment  In the event of cardiac or respiratory ARREST Do not call a "code blue"   In the event of cardiac or respiratory ARREST Do not perform  Intubation, CPR, defibrillation or ACLS   In the event of cardiac or respiratory ARREST Use medication by any route, position, wound care, and other measures to relive pain and suffering. May use oxygen, suction and manual treatment of airway obstruction as needed for comfort.      01/23/15 2205           Consults GI  DVT Prophylaxis   SCDs   Lab Results  Component Value Date   PLT 178 01/28/2015     Time Spent in minutes  25 minutes Madelline Eshbach M.D on 01/28/2015 at 8:20 AM  Between 7am to 6pm - Pager - [phone]  After 6pm go to [email or web address] - password [email or web address] [facility]   Office  [phone]

## 2015-01-28 NOTE — Clinical Social Work Note (Signed)
Clinical Social Work Assessment  Patient Details  Name: Ashley [name] MRN: [id number] Date of Birth: June 15, 1918  Date of referral:  01/28/15               Reason for consult:  Facility Placement                Permission sought to share information with:  Chartered certified accountant granted to share information::  Yes, Verbal Permission Granted  Name::      IT sales professional::   [place]   Relationship::     Contact Information:     Housing/Transportation Living arrangements for the past 2 months:  Charity fundraiser of Information:  Patient Patient Interpreter Needed:  None Criminal Activity/Legal Involvement Pertinent to Current Situation/Hospitalization:  No - Comment as needed Significant Relationships:  Siblings, Other Family Members Lives with:  Other (Comment) (Independent Living Resident at [facility]. ) Do you feel safe going back to the place where you live?  Yes Need for family participation in patient care:  Yes (Comment)  Care giving concerns: Patient is an Chief Strategy Officer at [employer].    Social Worker assessment / plan: Holiday representative (CSW) received call from [employer] at [facility] stating that patient needs to come to the [place] unit from [facility]. CSW met with patient and her nephew was at bedside. Patient was alert and oriented and sitting up in the bed. CSW introduced self and explained role of CSW department. Patient reported that she lives with her sister Ashley [name] at [facility] in [place]. Patient reported that her sister came to the hospital today and is having heart issues. Nephew reported that patient can't return to home alone and has to go to [facility] [place]. Patient is agreeable to going to [place]. [facility] reported that they can accept patient. Per MD patient is not medically stable for D/C today and will likely be ready  tomorrow. CSW asked MD to order PT and complete D/C Summary today for weekend D/C.   FL2 complete and on chart.    Employment status:  Retired, Disabled (Comment on whether or not currently receiving Disability) Insurance information:  Medicare PT Recommendations:  Not assessed at this time Information / Referral to community resources:  [place]  Patient/Family's Response to care: Patient and nephew are agreeable to going to [employer].   Patient/Family's Understanding of and Emotional Response to Diagnosis, Current Treatment, and Prognosis: Patient was pleasant throughout assessment and thanked CSW for visit.   Emotional Assessment Appearance:  Appears stated age Attitude/Demeanor/Rapport:    Affect (typically observed):  Accepting, Adaptable, Pleasant Orientation:  Oriented to Self, Oriented to Place, Oriented to  Time, Oriented to Situation Alcohol / Substance use:  Not Applicable Psych involvement (Current and /or in the community):  No (Comment)  Discharge Needs  Concerns to be addressed:  Discharge Planning Concerns Readmission within the last 30 days:  No Current discharge risk:  Chronically ill Barriers to Discharge:  Continued Medical Work up   Loralyn Freshwater, LCSW 01/28/2015, 10:10 AM

## 2015-01-28 NOTE — Progress Notes (Signed)
VSS and patient alert and oriented.  Continues to have blood in stool.  Hgb stable.  No complaints of pain or nausea.

## 2015-01-28 NOTE — Progress Notes (Signed)
Initial Nutrition Assessment   INTERVENTION:   Coordination of Care: await diet advancement as medically able Medical Food Supplement Therapy: will recommend Mighty Shakes on meal trays TID for added nutrition (each shake provides 300kcals and 9g protein)   NUTRITION DIAGNOSIS:   Inadequate oral intake related to acute illness as evidenced by  (pt currently remains on FL, has been NPO and liquids since admission).  GOAL:   Patient will meet greater than or equal to 90% of their needs  MONITOR:    (Energy Intake, Electrolyte and Renal Profile, Anthropometrics, Digestive System)   ASSESSMENT:   Pt admitted with hematochezia likely from diverticular bleed per MD note. Pt reports having stools this am without blood present.   Past Medical History  Diagnosis Date  . Gout   . Hypertension   . Hypothyroid   . Osteoarthritis   . Diverticulitis   . Cancer skin   Diet Order:  Diet full liquid Room service appropriate?: Yes; Fluid consistency:: Thin    Current Nutrition: Pt reports eating vegetable soup with chocolate pudding for lunch and tolerating well. Recorded po intake 87% of liquid meal trays.  Food/Nutrition-Related History: Pt reports having a very good appetite PTA, eating 3 meals per day.   Medications: calcium-vitamin D  Electrolyte/Renal Profile and Glucose Profile:   Recent Labs Lab 01/23/15 1821 01/25/15 0655  NA 140 142  K 4.1 4.5  CL 105 114*  CO2 25 23  BUN 30* 21*  CREATININE 1.13* 1.02*  CALCIUM 9.7 8.8*  GLUCOSE 133* 101*   Protein Profile:  Recent Labs Lab 01/23/15 1821  ALBUMIN 3.9    Gastrointestinal Profile: Last BM:  01/28/2015   Weight Change: Pt reports stable weight of 126lbs PTA.   Skin:  Reviewed, no issues   Height:   Ht Readings from Last 1 Encounters:  01/23/15 5\' 6"  (1.676 m)    Weight:   Wt Readings from Last 1 Encounters:  01/23/15 126 lb 9.6 oz (57.425 kg)    BMI:  Body mass index is 20.44  kg/(m^2).   EDUCATION NEEDS:   No education needs identified at this time    [place], [place], LDN Pager [phone]

## 2015-01-28 NOTE — Discharge Summary (Signed)
Ashley [name], is a 81 y.o. female  DOB 1919/05/06  MRN [id number].  Admission date:  01/23/2015  Admitting Physician  [name], MD  Discharge Date:  01/28/2015   Primary MD  [name], MD  Recommendations for primary care physician for things to follow:   Follow up with DR.Skulskie in 10 days   Admission Diagnosis  Bright red blood per rectum [K62.5] Diverticulosis of large intestine with hemorrhage [K57.31]   Discharge Diagnosis  Bright red blood per rectum [K62.5] Diverticulosis of large intestine with hemorrhage [K57.31]    Active Problems:   GI bleed      Past Medical History  Diagnosis Date  . Gout   . Hypertension   . Hypothyroid   . Osteoarthritis   . Diverticulitis   . Cancer skin    Past Surgical History  Procedure Laterality Date  . Cataract extraction    . Finger crystal removal      right 3rd  . Toe crystal removal      right 1st toe       History of present illness and  Hospital Course:     Kindly see H&P for history of present illness and admission details, please review complete Labs, Consult reports and Test reports for all details in brief  HPI  from the history and physical done on the day of admission 81 year old female patient came in secondary to rectal bleed. No abdominal pain, no diarrhea. Admitted to hospitalist service for rectal bleed likely diverticular, patient received IV fluids. She was kept nothing by mouth. He was seen by gastroenterology.  Hospital Course  #1 GI bleed secondary to diverticular bleed. She did have a bleeding scan which did not show any focus of bleeding. Global stayed around 8.3. Did not require any transfusions. Abdominal CAT scan did not show any colonic mass. He did show diverticulosis. Dr.Skulskie recommended she likely had a  diverticular bleed. Patient did not have any further bleeding since yesterday morning. She is stable and we advanced her diet to regular diet. Tomorrow she'll go to [place]. He needs follow-up with gastroenterology as an outpatient for evaluation of rectal bleeding if it happens again. #2. Acute renal failure secondary to dehydration improved with fluids. 3/Hypothyroidism patient is continued on Synthroid.  Discharge Condition: Stable   Follow UP      Discharge Instructions  and  Discharge Medications        Medication List    ASK your doctor about these medications        allopurinol 100 MG tablet  Commonly known as:  ZYLOPRIM  Take 100 mg by mouth daily as needed (for gout flare up.).     fenofibrate 145 MG tablet  Commonly known as:  TRICOR  Take 145 mg by mouth daily.     levothyroxine 150 MCG tablet  Commonly known as:  SYNTHROID, LEVOTHROID  Take 150 mcg by mouth daily before breakfast.     Vitamin D3 5000 UNITS Caps  Take 5,000 Units by mouth daily.          Diet and Activity recommendation: See Discharge Instructions above   Consults obtained - GI   Major procedures and Radiology Reports - PLEASE review detailed and final reports for all details, in brief -      Nm Gi Blood Loss  01/24/2015   CLINICAL DATA:  Bloody stools for 2 days.  EXAM: NUCLEAR MEDICINE GASTROINTESTINAL BLEEDING SCAN  TECHNIQUE: Sequential abdominal images were obtained following  intravenous administration of Tc-12m labeled red blood cells.  RADIOPHARMACEUTICALS:  21.3 mCi Tc-33m in-vitro labeled red cells.  COMPARISON:  CT scan 01/23/2015  FINDINGS: No active bleeding site is identified.  IMPRESSION: No active bleeding site is identified.   Electronically Signed   By: Marijo Sanes M.D.   On: 01/24/2015 15:08   Ct Abdomen Pelvis W Contrast  01/27/2015   CLINICAL DATA:  Rectal bleeding since Sunday. History skin cancer. Diverticulitis.  EXAM: CT ABDOMEN AND PELVIS WITH CONTRAST   TECHNIQUE: Multidetector CT imaging of the abdomen and pelvis was performed using the standard protocol following bolus administration of intravenous contrast.  CONTRAST:  86mL OMNIPAQUE IOHEXOL 300 MG/ML  SOLN  COMPARISON:  01/23/2015  FINDINGS: Lower chest: Clear lung bases. Cardiomegaly, accentuated by a pectus excavatum deformity. Right coronary artery atherosclerosis. Fluid level in the esophagus on image 7.  Hepatobiliary: Moderate cirrhosis, without focal liver lesion Normal gallbladder, without biliary ductal dilatation.  Pancreas: Moderate pancreatic atrophy, without duct dilatation or focal mass.  Spleen: Normal  Adrenals/Urinary Tract: Normal adrenal glands. Mild renal cortical thinning bilaterally. Subcentimeter right renal lesion is likely a cyst. Normal left kidney. No hydronephrosis. Normal urinary bladder.  Stomach/Bowel: Normal stomach, without wall thickening. Scattered colonic diverticula. Normal terminal ileum. Normal small bowel.  Vascular/Lymphatic: Advanced aortic and branch vessel atherosclerosis. Celiac narrowing with mild poststenotic dilatation on sagittal image 99. Patent portal vein. Hepatic veins not well evaluated. No abdominopelvic adenopathy.  Reproductive: Normal uterus and adnexa.  Other: No significant free fluid. Mild-to-moderate pelvic floor laxity.  Musculoskeletal: Moderate osteopenia. Advanced lumbosacral spondylosis.  IMPRESSION: 1.  No acute process or explanation for rectal bleeding. 2. Moderate cirrhosis, without specific findings of portal venous hypertension. 3. Esophageal air fluid level suggests dysmotility or gastroesophageal reflux. 4.  Atherosclerosis, including within the coronary arteries. 5. Pelvic floor laxity.   Electronically Signed   By: Abigail Miyamoto M.D.   On: 01/27/2015 13:08   Ct Abdomen Pelvis W Contrast  01/23/2015   CLINICAL DATA:  Rectal bleeding beginning today.  EXAM: CT ABDOMEN AND PELVIS WITH CONTRAST  TECHNIQUE: Multidetector CT imaging of the  abdomen and pelvis was performed using the standard protocol following bolus administration of intravenous contrast.  CONTRAST:  75mL OMNIPAQUE IOHEXOL 240 MG/ML SOLN, 28mL OMNIPAQUE IOHEXOL 300 MG/ML SOLN  COMPARISON:  None.  FINDINGS: Lung bases demonstrate subtle peripheral increased interstitial markings no consolidation or effusion. Borderline cardiomegaly.  Abdominal images demonstrate subtle nodular contour to the liver. Small calcified granuloma over the right lobe of the liver. The spleen, pancreas, gallbladder and adrenal glands are within normal. The appendix is not visualized.  Kidneys normal in size with no hydronephrosis or nephrolithiasis. There is a sub cm hypodensity over the upper pole of the right renal cortex too small to characterize but likely a cyst. Ureters are within normal.  There is mild-to-moderate calcified plaque over the abdominal aorta and iliac arteries.  Mesentery is within normal.  Small bowel is unremarkable.  There is diverticulosis of the colon most prominent over the sigmoid colon without active inflammation.  Pelvic images demonstrate the bladder, uterus and rectum to be within normal. Adnexal regions are unremarkable.  There are moderate degenerative changes of the spine with multilevel disc disease over the lumbar spine. There are mild degenerative changes of the hips  IMPRESSION: No acute findings in the abdomen/ pelvis.  Moderate diverticulosis of the sigmoid colon without active inflammation.  Sub cm right renal cortical hypodensity too small to characterize, but  likely a cyst.  Mild cardiomegaly. Subtle peripheral increased interstitial markings over the lung bases.  Moderate degenerative changes spine with multilevel disc disease over the lumbar spine.   Electronically Signed   By: Marin Olp M.D.   On: 01/23/2015 20:01    Micro Results     No results found for this or any previous visit (from the past 240 hour(s)).     Today   Subjective:   Ashley  [name] today has no headache,no chest abdominal pain,no new weakness tingling or numbness,   Objective:   Blood pressure 148/66, pulse 78, temperature 97.6 F (36.4 C), temperature source Oral, resp. rate 16, height 5\' 6"  (1.676 m), weight 57.425 kg (126 lb 9.6 oz), SpO2 99 %.   Intake/Output Summary (Last 24 hours) at 01/28/15 1124 Last data filed at 01/28/15 0900  Gross per 24 hour  Intake   1360 ml  Output   1648 ml  Net   -288 ml    Exam Awake Alert, Oriented x 3, No new F.N deficits, Normal affect .AT,PERRAL Supple Neck,No JVD, No cervical lymphadenopathy appriciated.  Symmetrical Chest wall movement, Good air movement bilaterally, CTAB RRR,No Gallops,Rubs or new Murmurs, No Parasternal Heave +ve B.Sounds, Abd Soft, Non tender, No organomegaly appriciated, No rebound -guarding or rigidity. No Cyanosis, Clubbing or edema, No new Rash or bruise  Data Review   CBC w Diff: Lab Results  Component Value Date   WBC 4.2 01/28/2015   WBC 5.5 08/29/2014   WBC 4.3 06/15/2009   HGB 9.1* 01/28/2015   HGB 13.3 08/29/2014   HGB 14.2 06/15/2009   HCT 27.2* 01/28/2015   HCT 40.4 08/29/2014   HCT 42.5 06/15/2009   PLT 178 01/28/2015   PLT 169 08/29/2014   PLT 196 06/15/2009   LYMPHOPCT 29 01/23/2015   LYMPHOPCT 28.3 08/29/2014   LYMPHOPCT 42.6 06/15/2009   MONOPCT 11 01/23/2015   MONOPCT 9.9 08/29/2014   MONOPCT 11.5 06/15/2009   EOSPCT 4 01/23/2015   EOSPCT 2.2 08/29/2014   EOSPCT 3.0 06/15/2009   BASOPCT 3 01/23/2015   BASOPCT 1.3 08/29/2014   BASOPCT 0.9 06/15/2009    CMP: Lab Results  Component Value Date   NA 142 01/25/2015   NA 139 08/29/2014   NA 142 03/15/2009   K 4.5 01/25/2015   K 4.4 08/29/2014   K 4.2 03/15/2009   CL 114* 01/25/2015   CL 107 08/29/2014   CL 101 03/15/2009   CO2 23 01/25/2015   CO2 24 08/29/2014   CO2 28 03/15/2009   BUN 21* 01/25/2015   BUN 22* 08/29/2014   BUN 18 03/15/2009   CREATININE 1.02* 01/25/2015   CREATININE 0.95  08/29/2014   CREATININE 0.8 03/15/2009   PROT 7.5 01/23/2015   PROT 7.2 08/29/2014   PROT 7.6 03/15/2009   ALBUMIN 3.9 01/23/2015   ALBUMIN 3.9 08/29/2014   BILITOT 0.6 01/23/2015   BILITOT 0.7 08/29/2014   BILITOT 0.90 03/15/2009   ALKPHOS 87 01/23/2015   ALKPHOS 74 08/29/2014   ALKPHOS 82 03/15/2009   AST 32 01/23/2015   AST 21 08/29/2014   AST 39* 03/15/2009   ALT 15 01/23/2015   ALT 16 08/29/2014   ALT 25 03/15/2009  .   Total Time in preparing paper work, data evaluation and todays exam - 68 minutes  Marialy Urbanczyk M.D on 01/28/2015 at 11:24 AM

## 2015-01-29 NOTE — Clinical Social Work Placement (Signed)
   CLINICAL SOCIAL WORK PLACEMENT  NOTE  Date:  01/29/2015  Patient Details  Name: Ashley [name] MRN: [id number] Date of Birth: 10-03-1918  Clinical Social Work is seeking post-discharge placement for this patient at the [place] level of care (*CSW will initial, date and re-position this form in  chart as items are completed):  Yes   Patient/family provided with [facility] Work Department's list of facilities offering this level of care within the geographic area requested by the patient (or if unable, by the patient's family).  Yes   Patient/family informed of their freedom to choose among providers that offer the needed level of care, that participate in Medicare, Medicaid or managed care program needed by the patient, have an available bed and are willing to accept the patient.  Yes   Patient/family informed of [facility]'s ownership interest in [facility] and [facility], as well as of the fact that they are under no obligation to receive care at these facilities.  PASRR submitted to EDS on 01/28/15     PASRR number received on 01/28/15     Existing PASRR number confirmed on       FL2 transmitted to all facilities in geographic area requested by pt/family on 01/28/15     FL2 transmitted to all facilities within larger geographic area on       Patient informed that his/her managed care company has contracts with or will negotiate with certain facilities, including the following:        Yes   Patient/family informed of bed offers received.  Patient chooses bed at  [facility] )     Physician recommends and patient chooses bed at      Patient to be transferred to  [facility] ) on 01/29/15.  Patient to be transferred to facility by  Don Broach in personal vehicle. )     Patient family notified on 01/29/15 of transfer.  Name of family member notified:   Don Broach aware of D/C today. )     PHYSICIAN        Additional Comment:    _______________________________________________ Loralyn Freshwater, LCSW 01/29/2015, 11:03 AM

## 2015-01-29 NOTE — Progress Notes (Signed)
Patient is medically stable for D/C to [facility] today. Patient is going to room 351 on [facility]. RN will call report at [phone]. Clinical Education officer, museum (CSW) prepared D/C packet and sent D/C Summary to [employer] at [employer] yesterday 01/28/15. Patient's nephew [name] will provide transport. Patient is aware of above. Please reconsult if future social work needs arise. CSW signing off.   Blima Rich, [place] [phone]

## 2015-01-29 NOTE — Discharge Summary (Signed)
Ashley [name], is a 81 y.o. female  DOB 09/28/1918  MRN [id number].  Admission date:  01/23/2015  Admitting Physician  [name], MD  Discharge Date:  01/29/2015   Primary MD  [name], MD  Recommendations for primary care physician for things to follow:   Follow up with DR.Skulskie in 10 days   Admission Diagnosis  Bright red blood per rectum [K62.5] Diverticulosis of large intestine with hemorrhage [K57.31]   Discharge Diagnosis  Bright red blood per rectum [K62.5] Diverticulosis of large intestine with hemorrhage [K57.31]    Active Problems:   GI bleed      Past Medical History  Diagnosis Date  . Gout   . Hypertension   . Hypothyroid   . Osteoarthritis   . Diverticulitis   . Cancer skin    Past Surgical History  Procedure Laterality Date  . Cataract extraction    . Finger crystal removal      right 3rd  . Toe crystal removal      right 1st toe       History of present illness and  Hospital Course:     Kindly see H&P for history of present illness and admission details, please review complete Labs, Consult reports and Test reports for all details in brief  HPI  from the history and physical done on the day of admission 81 year old female patient came in secondary to rectal bleed. No abdominal pain, no diarrhea. Admitted to hospitalist service for rectal bleed likely diverticular, patient received IV fluids. She was kept nothing by mouth. He was seen by gastroenterology.  Hospital Course  #1 GI bleed secondary to diverticular bleed. She did have a bleeding scan which did not show any focus of bleeding. Global stayed around 8.3. Did not require any transfusions. Abdominal CAT scan did not show any colonic mass. He did show diverticulosis. Dr.Skulskie recommended she likely had a  diverticular bleed. Patient did not have any further bleeding since yesterday morning. She is stable and we advanced her diet to regular diet. Tomorrow she'll go to [place]. He needs follow-up with gastroenterology as an outpatient for evaluation of rectal bleeding if it happens again. Use MiraLAX as needed for constipation. #2. Acute renal failure secondary to dehydration improved with fluids. 3/Hypothyroidism patient is continued on Synthroid. Stable for discharge. Marca Ancona recommendations patient needs low residual diet for about 4-5 days. Patient will see Dr. Gustavo Lah as an  Out pt,..  Discharge Condition: Stable   Follow UP      Follow-up Information    Follow up with [name], MD In 1 week.   Specialty:  Family Medicine   Contact information:   [place] [id number] [phone]       Follow up with Lollie Sails, MD In 1 week.   Specialty:  Gastroenterology   Contact information:   [place]  [id number] [phone]         Discharge Instructions  and  Discharge Medications        Medication List    STOP taking these medications        levothyroxine 150 MCG tablet  Commonly known as:  SYNTHROID, LEVOTHROID      TAKE these medications        allopurinol 100 MG tablet  Commonly known as:  ZYLOPRIM  Take 100 mg by mouth daily as needed (for gout flare up.).     fenofibrate 145 MG tablet  Commonly known as:  TRICOR  Take 145 mg by mouth daily.     Vitamin D3 5000 UNITS Caps  Take 5,000 Units by mouth daily.          Diet and Activity recommendation: See Discharge Instructions above   Consults obtained - GI   Major procedures and Radiology Reports - PLEASE review detailed and final reports for all details, in brief -      Nm Gi Blood Loss  01/24/2015   CLINICAL DATA:  Bloody stools for 2 days.  EXAM: NUCLEAR MEDICINE GASTROINTESTINAL BLEEDING SCAN  TECHNIQUE: Sequential abdominal images  were obtained following intravenous administration of Tc-2m labeled red blood cells.  RADIOPHARMACEUTICALS:  21.3 mCi Tc-69m in-vitro labeled red cells.  COMPARISON:  CT scan 01/23/2015  FINDINGS: No active bleeding site is identified.  IMPRESSION: No active bleeding site is identified.   Electronically Signed   By: Marijo Sanes M.D.   On: 01/24/2015 15:08   Ct Abdomen Pelvis W Contrast  01/27/2015   CLINICAL DATA:  Rectal bleeding since Sunday. History skin cancer. Diverticulitis.  EXAM: CT ABDOMEN AND PELVIS WITH CONTRAST  TECHNIQUE: Multidetector CT imaging of the abdomen and pelvis was performed using the standard protocol following bolus administration of intravenous contrast.  CONTRAST:  84mL OMNIPAQUE IOHEXOL 300 MG/ML  SOLN  COMPARISON:  01/23/2015  FINDINGS: Lower chest: Clear lung bases. Cardiomegaly, accentuated by a pectus excavatum deformity. Right coronary artery atherosclerosis. Fluid level in the esophagus on image 7.  Hepatobiliary: Moderate cirrhosis, without focal liver lesion Normal gallbladder, without biliary ductal dilatation.  Pancreas: Moderate pancreatic atrophy, without duct dilatation or focal mass.  Spleen: Normal  Adrenals/Urinary Tract: Normal adrenal glands. Mild renal cortical thinning bilaterally. Subcentimeter right renal lesion is likely a cyst. Normal left kidney. No hydronephrosis. Normal urinary bladder.  Stomach/Bowel: Normal stomach, without wall thickening. Scattered colonic diverticula. Normal terminal ileum. Normal small bowel.  Vascular/Lymphatic: Advanced aortic and branch vessel atherosclerosis. Celiac narrowing with mild poststenotic dilatation on sagittal image 99. Patent portal vein. Hepatic veins not well evaluated. No abdominopelvic adenopathy.  Reproductive: Normal uterus and adnexa.  Other: No significant free fluid. Mild-to-moderate pelvic floor laxity.  Musculoskeletal: Moderate osteopenia. Advanced lumbosacral spondylosis.  IMPRESSION: 1.  No acute process  or explanation for rectal bleeding. 2. Moderate cirrhosis, without specific findings of portal venous hypertension. 3. Esophageal air fluid level suggests dysmotility or gastroesophageal reflux. 4.  Atherosclerosis, including within the coronary arteries. 5. Pelvic floor laxity.   Electronically Signed   By: Abigail Miyamoto M.D.   On: 01/27/2015 13:08   Ct Abdomen Pelvis W Contrast  01/23/2015   CLINICAL DATA:  Rectal bleeding beginning today.  EXAM: CT ABDOMEN AND PELVIS WITH CONTRAST  TECHNIQUE: Multidetector CT imaging of the abdomen and pelvis was performed using the standard protocol following bolus administration of intravenous contrast.  CONTRAST:  34mL OMNIPAQUE IOHEXOL 240 MG/ML SOLN, 23mL OMNIPAQUE IOHEXOL 300 MG/ML SOLN  COMPARISON:  None.  FINDINGS: Lung bases demonstrate subtle peripheral increased interstitial markings no consolidation or effusion. Borderline cardiomegaly.  Abdominal images demonstrate subtle nodular contour to the liver. Small calcified granuloma over the right lobe of the liver. The spleen, pancreas, gallbladder and adrenal glands are within normal. The appendix is not visualized.  Kidneys normal in size with no hydronephrosis or nephrolithiasis. There is a sub cm hypodensity over the upper pole of the right renal cortex too small to characterize but likely a cyst. Ureters are within normal.  There is mild-to-moderate calcified  plaque over the abdominal aorta and iliac arteries.  Mesentery is within normal.  Small bowel is unremarkable.  There is diverticulosis of the colon most prominent over the sigmoid colon without active inflammation.  Pelvic images demonstrate the bladder, uterus and rectum to be within normal. Adnexal regions are unremarkable.  There are moderate degenerative changes of the spine with multilevel disc disease over the lumbar spine. There are mild degenerative changes of the hips  IMPRESSION: No acute findings in the abdomen/ pelvis.  Moderate diverticulosis of  the sigmoid colon without active inflammation.  Sub cm right renal cortical hypodensity too small to characterize, but likely a cyst.  Mild cardiomegaly. Subtle peripheral increased interstitial markings over the lung bases.  Moderate degenerative changes spine with multilevel disc disease over the lumbar spine.   Electronically Signed   By: Marin Olp M.D.   On: 01/23/2015 20:01    Micro Results     No results found for this or any previous visit (from the past 240 hour(s)).     Today   Subjective:   Davy Briggs today has no headache,no chest abdominal pain,no new weakness tingling or numbness,   Objective:   Blood pressure 117/52, pulse 65, temperature 97.7 F (36.5 C), temperature source Oral, resp. rate 18, height 5\' 6"  (1.676 m), weight 57.425 kg (126 lb 9.6 oz), SpO2 100 %.   Intake/Output Summary (Last 24 hours) at 01/29/15 0908 Last data filed at 01/29/15 [id number]  Gross per 24 hour  Intake   2125 ml  Output   1175 ml  Net    950 ml    Exam Awake Alert, Oriented x 3, No new F.N deficits, Normal affect [place].AT,PERRAL Supple Neck,No JVD, No cervical lymphadenopathy appriciated.  Symmetrical Chest wall movement, Good air movement bilaterally, CTAB RRR,No Gallops,Rubs or new Murmurs, No Parasternal Heave +ve B.Sounds, Abd Soft, Non tender, No organomegaly appriciated, No rebound -guarding or rigidity. No Cyanosis, Clubbing or edema, No new Rash or bruise  Data Review   CBC w Diff:  Lab Results  Component Value Date   WBC 4.2 01/28/2015   WBC 5.5 08/29/2014   WBC 4.3 06/15/2009   HGB 9.1* 01/28/2015   HGB 13.3 08/29/2014   HGB 14.2 06/15/2009   HCT 27.2* 01/28/2015   HCT 40.4 08/29/2014   HCT 42.5 06/15/2009   PLT 178 01/28/2015   PLT 169 08/29/2014   PLT 196 06/15/2009   LYMPHOPCT 29 01/23/2015   LYMPHOPCT 28.3 08/29/2014   LYMPHOPCT 42.6 06/15/2009   MONOPCT 11 01/23/2015   MONOPCT 9.9 08/29/2014   MONOPCT 11.5 06/15/2009   EOSPCT 4 01/23/2015    EOSPCT 2.2 08/29/2014   EOSPCT 3.0 06/15/2009   BASOPCT 3 01/23/2015   BASOPCT 1.3 08/29/2014   BASOPCT 0.9 06/15/2009    CMP:  Lab Results  Component Value Date   NA 142 01/25/2015   NA 139 08/29/2014   NA 142 03/15/2009   K 4.5 01/25/2015   K 4.4 08/29/2014   K 4.2 03/15/2009   CL 114* 01/25/2015   CL 107 08/29/2014   CL 101 03/15/2009   CO2 23 01/25/2015   CO2 24 08/29/2014   CO2 28 03/15/2009   BUN 21* 01/25/2015   BUN 22* 08/29/2014   BUN 18 03/15/2009   CREATININE 1.02* 01/25/2015   CREATININE 0.95 08/29/2014   CREATININE 0.8 03/15/2009   PROT 7.5 01/23/2015   PROT 7.2 08/29/2014   PROT 7.6 03/15/2009   ALBUMIN 3.9 01/23/2015   ALBUMIN  3.9 08/29/2014   BILITOT 0.6 01/23/2015   BILITOT 0.7 08/29/2014   BILITOT 0.90 03/15/2009   ALKPHOS 87 01/23/2015   ALKPHOS 74 08/29/2014   ALKPHOS 82 03/15/2009   AST 32 01/23/2015   AST 21 08/29/2014   AST 39* 03/15/2009   ALT 15 01/23/2015   ALT 16 08/29/2014   ALT 25 03/15/2009  .   Total Time in preparing paper work, data evaluation and todays exam - 53 minutes  Vallory Oetken M.D on 01/29/2015 at 9:08 AM

## 2015-01-29 NOTE — Progress Notes (Signed)
Report called to [facility] place.  Pt being sent out via wheelchair to her nephews Ashley [name]) waiting car with belongings

## 2015-02-22 ENCOUNTER — Other Ambulatory Visit: Payer: Self-pay | Admitting: Physician Assistant

## 2015-02-22 DIAGNOSIS — R932 Abnormal findings on diagnostic imaging of liver and biliary tract: Secondary | ICD-10-CM

## 2015-02-25 ENCOUNTER — Ambulatory Visit
Admission: RE | Admit: 2015-02-25 | Discharge: 2015-02-25 | Disposition: A | Discharge status: Home / Self Care | Payer: Medicare Other | Source: Ambulatory Visit | Attending: Physician Assistant | Admitting: Physician Assistant

## 2015-02-25 DIAGNOSIS — R932 Abnormal findings on diagnostic imaging of liver and biliary tract: Secondary | ICD-10-CM | POA: Insufficient documentation

## 2015-08-11 DIAGNOSIS — R5383 Other fatigue: Secondary | ICD-10-CM | POA: Diagnosis not present

## 2015-08-11 DIAGNOSIS — E559 Vitamin D deficiency, unspecified: Secondary | ICD-10-CM | POA: Diagnosis not present

## 2015-08-11 DIAGNOSIS — J019 Acute sinusitis, unspecified: Secondary | ICD-10-CM | POA: Diagnosis not present

## 2015-08-11 DIAGNOSIS — E785 Hyperlipidemia, unspecified: Secondary | ICD-10-CM | POA: Diagnosis not present

## 2015-08-11 DIAGNOSIS — I10 Essential (primary) hypertension: Secondary | ICD-10-CM | POA: Diagnosis not present

## 2015-08-11 DIAGNOSIS — Z1389 Encounter for screening for other disorder: Secondary | ICD-10-CM | POA: Diagnosis not present

## 2015-08-11 DIAGNOSIS — E7800 Pure hypercholesterolemia, unspecified: Secondary | ICD-10-CM | POA: Diagnosis not present

## 2015-08-11 DIAGNOSIS — E039 Hypothyroidism, unspecified: Secondary | ICD-10-CM | POA: Diagnosis not present

## 2015-08-11 DIAGNOSIS — Z0000 Encounter for general adult medical examination without abnormal findings: Secondary | ICD-10-CM | POA: Diagnosis not present

## 2015-11-15 DIAGNOSIS — E785 Hyperlipidemia, unspecified: Secondary | ICD-10-CM | POA: Diagnosis not present

## 2015-11-15 DIAGNOSIS — R5383 Other fatigue: Secondary | ICD-10-CM | POA: Diagnosis not present

## 2015-11-15 DIAGNOSIS — E559 Vitamin D deficiency, unspecified: Secondary | ICD-10-CM | POA: Diagnosis not present

## 2015-11-15 DIAGNOSIS — E7800 Pure hypercholesterolemia, unspecified: Secondary | ICD-10-CM | POA: Diagnosis not present

## 2015-11-15 DIAGNOSIS — R739 Hyperglycemia, unspecified: Secondary | ICD-10-CM | POA: Diagnosis not present

## 2015-11-15 DIAGNOSIS — I10 Essential (primary) hypertension: Secondary | ICD-10-CM | POA: Diagnosis not present

## 2015-11-15 DIAGNOSIS — M109 Gout, unspecified: Secondary | ICD-10-CM | POA: Diagnosis not present

## 2015-11-30 ENCOUNTER — Encounter: Payer: Self-pay | Admitting: Emergency Medicine

## 2015-11-30 ENCOUNTER — Emergency Department
Admission: EM | Admit: 2015-11-30 | Discharge: 2015-11-30 | Disposition: A | Discharge status: Home / Self Care | Payer: Medicare Other | Source: Home / Self Care | Attending: Emergency Medicine | Admitting: Emergency Medicine

## 2015-11-30 ENCOUNTER — Emergency Department: Payer: Medicare Other

## 2015-11-30 DIAGNOSIS — I10 Essential (primary) hypertension: Secondary | ICD-10-CM

## 2015-11-30 DIAGNOSIS — Z859 Personal history of malignant neoplasm, unspecified: Secondary | ICD-10-CM

## 2015-11-30 DIAGNOSIS — I639 Cerebral infarction, unspecified: Secondary | ICD-10-CM | POA: Diagnosis not present

## 2015-11-30 DIAGNOSIS — Y929 Unspecified place or not applicable: Secondary | ICD-10-CM | POA: Insufficient documentation

## 2015-11-30 DIAGNOSIS — W1809XA Striking against other object with subsequent fall, initial encounter: Secondary | ICD-10-CM

## 2015-11-30 DIAGNOSIS — S300XXA Contusion of lower back and pelvis, initial encounter: Secondary | ICD-10-CM | POA: Insufficient documentation

## 2015-11-30 DIAGNOSIS — M1990 Unspecified osteoarthritis, unspecified site: Secondary | ICD-10-CM | POA: Insufficient documentation

## 2015-11-30 DIAGNOSIS — Y9389 Activity, other specified: Secondary | ICD-10-CM

## 2015-11-30 DIAGNOSIS — Y999 Unspecified external cause status: Secondary | ICD-10-CM | POA: Insufficient documentation

## 2015-11-30 DIAGNOSIS — E039 Hypothyroidism, unspecified: Secondary | ICD-10-CM | POA: Insufficient documentation

## 2015-11-30 DIAGNOSIS — W010XXA Fall on same level from slipping, tripping and stumbling without subsequent striking against object, initial encounter: Secondary | ICD-10-CM | POA: Insufficient documentation

## 2015-11-30 DIAGNOSIS — M533 Sacrococcygeal disorders, not elsewhere classified: Secondary | ICD-10-CM | POA: Diagnosis not present

## 2015-11-30 DIAGNOSIS — R531 Weakness: Secondary | ICD-10-CM | POA: Diagnosis not present

## 2015-11-30 DIAGNOSIS — Z79899 Other long term (current) drug therapy: Secondary | ICD-10-CM | POA: Diagnosis not present

## 2015-11-30 DIAGNOSIS — M545 Low back pain: Secondary | ICD-10-CM | POA: Diagnosis not present

## 2015-11-30 DIAGNOSIS — M549 Dorsalgia, unspecified: Secondary | ICD-10-CM | POA: Diagnosis not present

## 2015-11-30 MED ORDER — ACETAMINOPHEN 500 MG PO TABS: 500.0000 mg | ORAL_TABLET | Freq: Four times a day (QID) | ORAL | Status: DC | PRN

## 2015-11-30 NOTE — ED Provider Notes (Signed)
[facility] Emergency Department Provider Note  ____________________________________________  Time seen: Approximately 1:41 PM  I have reviewed the triage vital signs and the nursing notes.   HISTORY  Chief Complaint Tailbone Pain and Fall    HPI Ashley [name] is a 82 y.o. female , NAD, presents to the emergency department accompanied by her daughter with 1 week history of tailbone pain. Patient states she was cleaning a water spill offer for floor when she lost balance and fell backwards on her washing machine. States that she had pain about the right buttocks and tailbone since that time. States that the buttock pain has been improving but her tailbone pain persists. Has been taking over-the-counter Tylenol 2-3 times daily without resolution of pain. Has also been sitting on a doughnut pillow which seems to help. Denies any lacerations, open wounds nor bleeding. Denies any headache, visual changes, chest pain, shortness of breath, palpitations, LOC, dizziness, abdominal pain, nausea, vomiting. Has not had any lower back pain, saddle paresthesias or loss of bowel or bladder control. Has not had any blood in her stool or changes in her bowel or bladder habits. The patient's daughter at the bedside states that the patient does not utilize her cane nor her walker as she should for walking stability.   Past Medical History  Diagnosis Date  . Gout   . Hypertension   . Hypothyroid   . Osteoarthritis   . Diverticulitis   . Cancer [facility]) skin    Patient Active Problem List   Diagnosis Date Noted  . GI bleed 01/23/2015    Past Surgical History  Procedure Laterality Date  . Cataract extraction    . Finger crystal removal      right 3rd  . Toe crystal removal      right 1st toe    Current Outpatient Rx  Name  Route  Sig  Dispense  Refill  . acetaminophen (TYLENOL) 500 MG tablet   Oral   Take 1 tablet (500 mg total) by mouth every 6 (six) hours as needed for  moderate pain.   30 tablet   0   . allopurinol (ZYLOPRIM) 100 MG tablet   Oral   Take 100 mg by mouth daily as needed (for gout flare up.).          Marland Kitchen Cholecalciferol (VITAMIN D3) 5000 UNITS CAPS   Oral   Take 5,000 Units by mouth daily.         . fenofibrate (TRICOR) 145 MG tablet   Oral   Take 145 mg by mouth daily.           Allergies Review of patient's allergies indicates no known allergies.  No family history on file.  Social History Social History  Substance Use Topics  . Smoking status: Never Smoker   . Smokeless tobacco: None  . Alcohol Use: No     Review of Systems  Constitutional: No fever/chills, fatigue Eyes: No visual changes.  Cardiovascular: No chest pain, palpitations. Respiratory: No shortness of breath.  Gastrointestinal: No abdominal pain.  No nausea, vomiting.  No diarrhea, hematochezia Genitourinary: Negative for dysuria, hematuria. No urinary hesitancy, urgency or increased frequency. Musculoskeletal:  Positive tailbone pain.Negative for back pain.  Skin:  Positive bruising about buttocks.Negative for rash, redness, swelling, skin sores, open wounds or lacerations . Neurological: Negative for headaches, focal weakness or numbness. No LOC, dizziness, tingling. No saddle paresthesias or loss of bowel or bladder control.10-point ROS otherwise negative.  ____________________________________________  PHYSICAL EXAM:  VITAL SIGNS: ED Triage Vitals  Enc Vitals Group     BP 11/30/15 1221 174/77 mmHg     Pulse Rate 11/30/15 1221 95     Resp 11/30/15 1221 18     Temp 11/30/15 1221 97.9 F (36.6 C)     Temp Source 11/30/15 1221 Oral     SpO2 11/30/15 1221 92 %     Weight 11/30/15 1221 126 lb (57.153 kg)     Height 11/30/15 1221 5\' 5"  (1.651 m)     Head Cir --      Peak Flow --      Pain Score 11/30/15 1222 8     Pain Loc --      Pain Edu? --      Excl. in [place]? --      Constitutional: Alert and oriented. Well appearing and in no  acute distress. Eyes: Conjunctivae are normal.  Head: AtraumaticAnd normocephalic . ENT:      Ears: No discharge noted from bilateral ear canals      Nose: No congestion/rhinnorhea. Neck: Supple with full range of motion Hematological/Lymphatic/Immunilogical: No cervical lymphadenopathy. Cardiovascular: Normal rate, regular rhythm. Grossly normal heart sounds. Good peripheral circulation with 2+ pulses in bilateral upper and lower extremities . Respiratory: Normal respiratory effort without tachypnea or retractions. Lungs CTAB with breath sounds noted in all lung fields . Gastrointestinal:  No CVA tenderness. Musculoskeletal: No tenderness to palpation about the thoracic, lumbar, sacral spinal area. No tenderness to palpation about the bilateral hips and greater trochanters. Patient is able to move all 4 extremities without pain and with ease. Patient has baseline gait and normal posture utilizing her walker for stability.  No lower extremity tenderness nor edema.  No joint effusions. Neurologic:  Normal speech and language. No gross focal neurologic deficits are appreciated.  Skin:  Trace ecchymosis is noted about the right buttocks. No open wounds, lacerations, bruising, redness, swelling, warmth.  Skin is warm, dry and intact.  Psychiatric: Mood and affect are normal. Speech and behavior are normal. Patient exhibits appropriate insight and judgement.   ____________________________________________   LABS  None ____________________________________________  EKG  None ____________________________________________  RADIOLOGY I have personally viewed and evaluated these images (plain radiographs) as part of my medical decision making, as well as reviewing the written report by the radiologist.  Dg Lumbar Spine 2-3 Views  11/30/2015  CLINICAL DATA:  Status post fall 1 week ago with onset of low back pain radiating into the coccyx. Initial encounter. EXAM: LUMBAR SPINE - 2-3 VIEW  COMPARISON:  CT abdomen and pelvis 01/27/2015. FINDINGS: No acute abnormality is identified. Severe loss of disc space height is seen from L3-S1. Lower lumbar facet arthropathy is noted. Mild scoliosis is noted. Extensive atherosclerotic calcifications are seen. IMPRESSION: No acute abnormality. Multilevel spondylosis. Atherosclerosis. Electronically Signed   By: Inge Rise M.D.   On: 11/30/2015 13:23   Dg Sacrum/coccyx  11/30/2015  CLINICAL DATA:  Pain after falling in laundry room. EXAM: SACRUM AND COCCYX - 2+ VIEW COMPARISON:  Pelvis film from 11/04/2011. FINDINGS: Bones are diffusely demineralized. No evidence for sacral fracture. SI joints and symphysis pubis are normal. IMPRESSION: Negative. Electronically Signed   By: Misty Stanley M.D.   On: 11/30/2015 13:22    ____________________________________________    PROCEDURES  Procedure(s) performed: None    Medications - No data to display   ____________________________________________   INITIAL IMPRESSION / ASSESSMENT AND PLAN / ED COURSE  Pertinent imaging results that  were available during my care of the patient were reviewed by me and considered in my medical decision making (see chart for details).  Patient's diagnosis is consistent with contusion of coccyx due to fall against object. Patient will be discharged home with prescriptions for  Tylenol to take as directed.  Patient may apply heat or ice to the affected area based on whatever gives her more relief of pain. Patient to continue to sit on the donut pillow until she is able to sit without pain.Patient is to follow up with  her primary care provider if symptoms persist past this treatment course. Patient is given ED precautions to return to the ED for any worsening or new symptoms.    ____________________________________________  FINAL CLINICAL IMPRESSION(S) / ED DIAGNOSES  Final diagnoses:  Contusion of coccyx, initial encounter  Fall against object, initial  encounter      NEW MEDICATIONS STARTED DURING THIS VISIT:  New Prescriptions   ACETAMINOPHEN (TYLENOL) 500 MG TABLET    Take 1 tablet (500 mg total) by mouth every 6 (six) hours as needed for moderate pain.         Braxton Feathers, PA-C 11/30/15 1351  Daymon Larsen, MD 11/30/15 1534

## 2015-11-30 NOTE — ED Notes (Signed)
Pt presents with tailbone pain after falling last week. Pt states she tripped and fell against the washing machine. Pt denies getting dizzy, denies sudden headache, denies LOC. Pt denies hitting her head. Pt states the tailbone pain has been increasing in severity since last week. Pt states she has been taking tylenol and using a heating pad with no relief. Pt denies using anticoagulants.

## 2015-11-30 NOTE — Discharge Instructions (Signed)
Tailbone Injury The tailbone is the small bone at the lower end of the backbone (spine). You may have stretched tissues, bruises, or a broken bone (fracture). These injuries can be painful. Most tailbone injuries get better on their own in 4-6 weeks. HOME CARE  Take medicines only as told by your doctor.  If told, apply ice to the injured area.  Put ice in a plastic bag.  Place a towel between your skin and the bag.  Leave the ice on for 20 minutes, 2-3 times per day. Do this for the first 1-2 days.  Sit on a large, rubber or inflated ring or cushion to lessen pain. Lean forward when you sit to help lessen pain.  Avoid sitting in one place for a long time.  Increase your activity as the pain allows.  Do exercises as told by your doctor or physical therapist.  If it is painful to poop, take medicine to help you poop (stool softeners) as told by your doctor.  Eat foods that have plenty of fiber.  Keep all follow-up visits as told by your doctor. This is important. GET HELP IF:  Your pain gets worse.  Pooping causes you pain.  You cannot poop (constipation).  You are leaking pee (urinary incontinence).  You have a fever.   This information is not intended to replace advice given to you by your health care provider. Make sure you discuss any questions you have with your health care provider.   Document Released: 06/16/2010 Document Revised: 09/28/2014 Document Reviewed: 05/10/2014 Elsevier Interactive Patient Education 2016 [employer]  Cryotherapy Cryotherapy is when you put ice on your injury. Ice helps lessen pain and puffiness (swelling) after an injury. Ice works the best when you start using it in the first 24 to 48 hours after an injury. HOME CARE  Put a dry or damp towel between the ice pack and your skin.  You may press gently on the ice pack.  Leave the ice on for no more than 10 to 20 minutes at a time.  Check your skin after 5 minutes to make sure your  skin is okay.  Rest at least 20 minutes between ice pack uses.  Stop using ice when your skin loses feeling (numbness).  Do not use ice on someone who cannot tell you when it hurts. This includes small children and people with memory problems (dementia). GET HELP RIGHT AWAY IF:  You have white spots on your skin.  Your skin turns blue or pale.  Your skin feels waxy or hard.  Your puffiness gets worse. MAKE SURE YOU:   Understand these instructions.  Will watch your condition.  Will get help right away if you are not doing well or get worse.   This information is not intended to replace advice given to you by your health care provider. Make sure you discuss any questions you have with your health care provider.   Document Released: 10/31/2007 Document Revised: 08/06/2011 Document Reviewed: 01/04/2011 Elsevier Interactive Patient Education 2016 [place] A contusion is a deep bruise. Contusions happen when an injury causes bleeding under the skin. Symptoms of bruising include pain, swelling, and discolored skin. The skin may turn blue, purple, or yellow. HOME CARE   Rest the injured area.  If told, put ice on the injured area.  Put ice in a plastic bag.  Place a towel between your skin and the bag.  Leave the ice on for 20 minutes, 2-3 times per  day.  If told, put light pressure (compression) on the injured area using an elastic bandage. Make sure the bandage is not too tight. Remove it and put it back on as told by your doctor.  If possible, raise (elevate) the injured area above the level of your heart while you are sitting or lying down.  Take over-the-counter and prescription medicines only as told by your doctor. GET HELP IF:  Your symptoms do not get better after several days of treatment.  Your symptoms get worse.  You have trouble moving the injured area. GET HELP RIGHT AWAY IF:   You have very bad pain.  You have a loss of feeling  (numbness) in a hand or foot.  Your hand or foot turns pale or cold.   This information is not intended to replace advice given to you by your health care provider. Make sure you discuss any questions you have with your health care provider.   Document Released: 10/31/2007 Document Revised: 02/02/2015 Document Reviewed: 09/29/2014 Elsevier Interactive Patient Education [employer].

## 2015-11-30 NOTE — ED Notes (Signed)
Pt in via triage with complaints of worsening pain to sacrum.  Pt reports falling at home approximately one week ago, landing on buttocks.  Pt denies LOC, denies hitting head.  Pt A/Ox4, no immediate distress at this time.

## 2015-12-01 ENCOUNTER — Emergency Department: Payer: Medicare Other

## 2015-12-01 ENCOUNTER — Inpatient Hospital Stay
Admission: EM | Admit: 2015-12-01 | Discharge: 2015-12-03 | DRG: 066 | Disposition: A | Discharge status: Home Health | Payer: Medicare Other | Attending: Internal Medicine | Admitting: Internal Medicine

## 2015-12-01 ENCOUNTER — Encounter: Payer: Self-pay | Admitting: Emergency Medicine

## 2015-12-01 DIAGNOSIS — I639 Cerebral infarction, unspecified: Secondary | ICD-10-CM | POA: Diagnosis not present

## 2015-12-01 DIAGNOSIS — R531 Weakness: Secondary | ICD-10-CM | POA: Diagnosis not present

## 2015-12-01 DIAGNOSIS — Z79899 Other long term (current) drug therapy: Secondary | ICD-10-CM

## 2015-12-01 DIAGNOSIS — W19XXXA Unspecified fall, initial encounter: Secondary | ICD-10-CM

## 2015-12-01 DIAGNOSIS — E039 Hypothyroidism, unspecified: Secondary | ICD-10-CM

## 2015-12-01 DIAGNOSIS — I6529 Occlusion and stenosis of unspecified carotid artery: Secondary | ICD-10-CM | POA: Diagnosis present

## 2015-12-01 DIAGNOSIS — I10 Essential (primary) hypertension: Secondary | ICD-10-CM

## 2015-12-01 DIAGNOSIS — M1990 Unspecified osteoarthritis, unspecified site: Secondary | ICD-10-CM

## 2015-12-01 DIAGNOSIS — M109 Gout, unspecified: Secondary | ICD-10-CM

## 2015-12-01 DIAGNOSIS — G459 Transient cerebral ischemic attack, unspecified: Secondary | ICD-10-CM | POA: Diagnosis present

## 2015-12-01 DIAGNOSIS — Z8673 Personal history of transient ischemic attack (TIA), and cerebral infarction without residual deficits: Secondary | ICD-10-CM | POA: Diagnosis present

## 2015-12-01 DIAGNOSIS — M549 Dorsalgia, unspecified: Secondary | ICD-10-CM | POA: Diagnosis present

## 2015-12-01 LAB — CBC WITH DIFFERENTIAL/PLATELET
Basophils Absolute: 0.1 10*3/uL (ref 0–0.1)
Basophils Relative: 2 %
EOS PCT: 3 %
Eosinophils Absolute: 0.2 10*3/uL (ref 0–0.7)
HCT: 41.6 % (ref 35.0–47.0)
HEMOGLOBIN: 13.9 g/dL (ref 12.0–16.0)
LYMPHS ABS: 1.3 10*3/uL (ref 1.0–3.6)
LYMPHS PCT: 26 %
MCH: 31.6 pg (ref 26.0–34.0)
MCHC: 33.4 g/dL (ref 32.0–36.0)
MCV: 94.8 fL (ref 80.0–100.0)
Monocytes Absolute: 0.7 10*3/uL (ref 0.2–0.9)
Monocytes Relative: 13 %
NEUTROS ABS: 2.8 10*3/uL (ref 1.4–6.5)
NEUTROS PCT: 56 %
PLATELETS: 166 10*3/uL (ref 150–440)
RBC: 4.39 MIL/uL (ref 3.80–5.20)
RDW: 14.9 % — ABNORMAL HIGH (ref 11.5–14.5)
WBC: 5 10*3/uL (ref 3.6–11.0)

## 2015-12-01 LAB — COMPREHENSIVE METABOLIC PANEL
ALT: 13 U/L — ABNORMAL LOW (ref 14–54)
ANION GAP: 9 (ref 5–15)
AST: 27 U/L (ref 15–41)
Albumin: 3.7 g/dL (ref 3.5–5.0)
Alkaline Phosphatase: 83 U/L (ref 38–126)
BILIRUBIN TOTAL: 1.1 mg/dL (ref 0.3–1.2)
BUN: 21 mg/dL — AB (ref 6–20)
CHLORIDE: 108 mmol/L (ref 101–111)
CO2: 25 mmol/L (ref 22–32)
Calcium: 9.7 mg/dL (ref 8.9–10.3)
Creatinine, Ser: 0.97 mg/dL (ref 0.44–1.00)
GFR, EST AFRICAN AMERICAN: 56 mL/min — AB (ref >60)
GFR, EST NON AFRICAN AMERICAN: 48 mL/min — AB (ref >60)
Glucose, Bld: 108 mg/dL — ABNORMAL HIGH (ref 65–99)
POTASSIUM: 3.9 mmol/L (ref 3.5–5.1)
Sodium: 142 mmol/L (ref 135–145)
TOTAL PROTEIN: 7.3 g/dL (ref 6.5–8.1)

## 2015-12-01 LAB — URINALYSIS COMPLETE WITH MICROSCOPIC (ARMC ONLY)
BACTERIA UA: NONE SEEN
Bilirubin Urine: NEGATIVE
GLUCOSE, UA: NEGATIVE mg/dL
Ketones, ur: NEGATIVE mg/dL
LEUKOCYTES UA: NEGATIVE
NITRITE: NEGATIVE
PH: 7 (ref 5.0–8.0)
PROTEIN: 100 mg/dL — AB
Specific Gravity, Urine: 1.008 (ref 1.005–1.030)
Squamous Epithelial / LPF: NONE SEEN

## 2015-12-01 LAB — TROPONIN I
Troponin I: 0.04 ng/mL (ref <0.03)
Troponin I: 0.03 ng/mL (ref <0.03)

## 2015-12-01 MED ORDER — ASPIRIN 81 MG PO CHEW
324.0000 mg | CHEWABLE_TABLET | Freq: Once | ORAL | Status: AC
  Administered 2015-12-01: 324 mg via ORAL
  Filled 2015-12-01: qty 4

## 2015-12-01 MED ORDER — LOSARTAN POTASSIUM 50 MG PO TABS
50.0000 mg | ORAL_TABLET | Freq: Every day | ORAL | Status: DC
  Administered 2015-12-02 – 2015-12-03 (×2): 50 mg via ORAL
  Filled 2015-12-01 (×3): qty 1

## 2015-12-01 MED ORDER — ASPIRIN EC 81 MG PO TBEC
81.0000 mg | DELAYED_RELEASE_TABLET | Freq: Every day | ORAL | Status: DC
  Administered 2015-12-02 – 2015-12-03 (×2): 81 mg via ORAL
  Filled 2015-12-01 (×2): qty 1

## 2015-12-01 MED ORDER — METOPROLOL TARTRATE 5 MG/5ML IV SOLN
INTRAVENOUS | Status: AC
  Filled 2015-12-01: qty 5

## 2015-12-01 MED ORDER — ONDANSETRON HCL 4 MG PO TABS: 4.0000 mg | ORAL_TABLET | Freq: Four times a day (QID) | ORAL | Status: DC | PRN

## 2015-12-01 MED ORDER — SENNOSIDES-DOCUSATE SODIUM 8.6-50 MG PO TABS: 1.0000 | ORAL_TABLET | Freq: Every evening | ORAL | Status: DC | PRN

## 2015-12-01 MED ORDER — ACETAMINOPHEN 500 MG PO TABS: 500.0000 mg | ORAL_TABLET | Freq: Four times a day (QID) | ORAL | Status: DC | PRN

## 2015-12-01 MED ORDER — SODIUM CHLORIDE 0.9% FLUSH
3.0000 mL | Freq: Two times a day (BID) | INTRAVENOUS | Status: DC
  Administered 2015-12-02 – 2015-12-03 (×4): 3 mL via INTRAVENOUS

## 2015-12-01 MED ORDER — ACETAMINOPHEN 500 MG PO TABS
1000.0000 mg | ORAL_TABLET | Freq: Once | ORAL | Status: AC
  Administered 2015-12-01: 1000 mg via ORAL
  Filled 2015-12-01: qty 2

## 2015-12-01 MED ORDER — ACETAMINOPHEN 325 MG PO TABS: 650.0000 mg | ORAL_TABLET | Freq: Four times a day (QID) | ORAL | Status: DC

## 2015-12-01 MED ORDER — ALLOPURINOL 100 MG PO TABS: 100.0000 mg | ORAL_TABLET | Freq: Every day | ORAL | Status: DC | PRN

## 2015-12-01 MED ORDER — ENOXAPARIN SODIUM 40 MG/0.4ML ~~LOC~~ SOLN
40.0000 mg | SUBCUTANEOUS | Status: DC
  Administered 2015-12-01 – 2015-12-02 (×2): 40 mg via SUBCUTANEOUS
  Filled 2015-12-01 (×2): qty 0.4

## 2015-12-01 MED ORDER — METOPROLOL TARTRATE 5 MG/5ML IV SOLN
5.0000 mg | INTRAVENOUS | Status: DC | PRN
  Administered 2015-12-02: 5 mg via INTRAVENOUS
  Filled 2015-12-01: qty 5

## 2015-12-01 MED ORDER — ASPIRIN 81 MG PO CHEW
CHEWABLE_TABLET | ORAL | Status: AC
  Filled 2015-12-01: qty 4

## 2015-12-01 MED ORDER — ONDANSETRON HCL 4 MG/2ML IJ SOLN: 4.0000 mg | Freq: Four times a day (QID) | INTRAMUSCULAR | Status: DC | PRN

## 2015-12-01 MED ORDER — LEVOTHYROXINE SODIUM 75 MCG PO TABS
150.0000 ug | ORAL_TABLET | Freq: Every day | ORAL | Status: DC
  Administered 2015-12-02 – 2015-12-03 (×2): 150 ug via ORAL
  Filled 2015-12-01 (×2): qty 2

## 2015-12-01 NOTE — ED Notes (Signed)
Pt in via triage with complaints of high blood pressure and numbness in right hand since this morning.  Pt denies headache, denies any changes in vision, denies any changes in speech.  Pt with weaker grip in right hand.  Pt A/Ox4, no immediate distress at this time.  MD at bedside.

## 2015-12-01 NOTE — ED Notes (Addendum)
Pt presents to ED with reports of right hand and arm numbness. Pt with slightly weaker hand grips on the right versus the left. Stroke screen otherwise negative. Pt speech clear. No facial drooping noted. Pt alert and oriented. Pt denies headache. Pt denies dizziness. Pt presents with some swelling to right fingers. Pt has a history of gout and osteoarthritis.

## 2015-12-01 NOTE — ED Provider Notes (Signed)
Time Seen: Approximately 1645 I have reviewed the triage notes  Chief Complaint: Numbness   History of Present Illness: LOUNETTE [name] is a 82 y.o. female who presents with some numbness in her right hand she states started this morning after breakfast. She can't be much more specific than that but it sounded like before 9 AM. Patient did not describe any weakness in her hands states it just felt "" numb "". Patient denies any visual disturbances trouble with speech or swallowing. She denies any fever or chills productive cough. She is unable to ambulate normally. Patient has a history of gout and what appears to be osteoarthritis. The patient was here yesterday after a non-syncopal fall. She denies any complications from that such as increased weakness in the legs or difficulty with bladder or bowel function as she landed primarily on her lower back region. She denies any head trauma or headaches. She denies any near syncope symptoms.   Past Medical History  Diagnosis Date  . Gout   . Hypertension   . Hypothyroid   . Osteoarthritis   . Diverticulitis   . Cancer [facility]) skin    Patient Active Problem List   Diagnosis Date Noted  . GI bleed 01/23/2015    Past Surgical History  Procedure Laterality Date  . Cataract extraction    . Finger crystal removal      right 3rd  . Toe crystal removal      right 1st toe    Past Surgical History  Procedure Laterality Date  . Cataract extraction    . Finger crystal removal      right 3rd  . Toe crystal removal      right 1st toe    Current Outpatient Rx  Name  Route  Sig  Dispense  Refill  . acetaminophen (TYLENOL) 500 MG tablet   Oral   Take 1 tablet (500 mg total) by mouth every 6 (six) hours as needed for moderate pain.   30 tablet   0   . allopurinol (ZYLOPRIM) 100 MG tablet   Oral   Take 100 mg by mouth daily as needed (for gout flare up.).          Marland Kitchen Cholecalciferol (VITAMIN D3) 5000 UNITS CAPS   Oral   Take 5,000  Units by mouth daily.         . fenofibrate (TRICOR) 145 MG tablet   Oral   Take 145 mg by mouth daily.           Allergies:  Review of patient's allergies indicates no known allergies.  Family History: No family history on file.  Social History: Social History  Substance Use Topics  . Smoking status: Never Smoker   . Smokeless tobacco: None  . Alcohol Use: No     Review of Systems:   10 point review of systems was performed and was otherwise negative:  Constitutional: No fever Eyes: No visual disturbances ENT: No sore throat, ear pain Cardiac: No chest pain Respiratory: No shortness of breath, wheezing, or stridor Abdomen: No abdominal pain, no vomiting, No diarrhea Endocrine: No weight loss, No night sweats Extremities: No peripheral edema, cyanosis Skin: No rashes, easy bruising Neurologic: No focal weakness, trouble with speech or swollowing Urologic: No dysuria, Hematuria, or urinary frequency   Physical Exam:  ED Triage Vitals  Enc Vitals Group     BP 12/01/15 1642 155/104 mmHg     Pulse Rate 12/01/15 1642 88  Resp 12/01/15 1642 18     Temp 12/01/15 1642 97.9 F (36.6 C)     Temp Source 12/01/15 1642 Oral     SpO2 12/01/15 1642 97 %     Weight 12/01/15 1642 126 lb (57.153 kg)     Height --      Head Cir --      Peak Flow --      Pain Score 12/01/15 1643 0     Pain Loc --      Pain Edu? --      Excl. in [place]? --     General: Awake , Alert , and Oriented times 3; GCS 15 Head: Normal cephalic , atraumatic Eyes: Pupils equal , round, reactive to light Nose/Throat: No nasal drainage, patent upper airway without erythema or exudate.  Neck: Supple, Full range of motion, No anterior adenopathy or palpable thyroid masses Lungs: Clear to ascultation without wheezes , rhonchi, or rales Heart: Regular rate, regular rhythm without murmurs , gallops , or rubs Abdomen: Soft, non tender without rebound, guarding , or rigidity; bowel sounds positive and  symmetric in all 4 quadrants. No organomegaly .        Extremities: 2 plus symmetric pulses. No edema, clubbing or cyanosis Neurologic: Examination shows some right upper extremity weakness overall in both the entire extremity across the shoulder biceps flexion and extension and weakness with hand grasp in comparison to her left upper extremity. Both lower extremities appear to have symmetric strength. Skin: warm, dry, no rashes   Labs:   All laboratory work was reviewed including any pertinent negatives or positives listed below:  Labs Reviewed  COMPREHENSIVE METABOLIC PANEL - Abnormal; Notable for the following:    Glucose, Bld 108 (*)    BUN 21 (*)    ALT 13 (*)    GFR calc non Af Amer 48 (*)    GFR calc Af Amer 56 (*)    All other components within normal limits  CBC WITH DIFFERENTIAL/PLATELET - Abnormal; Notable for the following:    RDW 14.9 (*)    All other components within normal limits  TROPONIN I - Abnormal; Notable for the following:    Troponin I 0.04 (*)    All other components within normal limits   Laboratory work was reviewed and showed no clinically significant abnormalities. Troponin was slightly elevated though given the patient's age and clinical presentation I felt wasn't significant EKG: *  ED ECG REPORT I, Daymon Larsen, the attending physician, personally viewed and interpreted this ECG.  Date: 12/01/2015 EKG Time: 1949 Rate: 78 Rhythm: normal sinus rhythm with multiple PVCs QRS Axis: normal Intervals: Right bundle-branch block ST/T Wave abnormalities: normal Conduction Disturbances: none Narrative Interpretation: unremarkable No acute ischemic changes are noted   Radiology: *   CT HEAD WO CONTRAST (Final result) Result time: 12/01/15 19:31:53   Final result by Rad Results In Interface (12/01/15 19:31:53)   Narrative:   CLINICAL DATA: 82 year old female with right arm weakness.  EXAM: CT HEAD WITHOUT CONTRAST  TECHNIQUE: Contiguous  axial images were obtained from the base of the skull through the vertex without intravenous contrast.  COMPARISON: Head CT 08/29/2014  FINDINGS: Brain: Stable generalized atrophy.No intracranial hemorrhage, mass effect, or midline shift. No hydrocephalus. The basilar cisterns are patent. No evidence of territorial infarct. No intracranial fluid collection.  Vascular: No hyperdense vessel or abnormal calcification. Atherosclerosis of skullbase vasculature.  Skull: Calvarium is intact.  Sinuses/Orbits: Bilateral cataract extraction. Included paranasal sinuses and  mastoid air cells are well aerated.  Other: None.  IMPRESSION: Stable generalized atrophy without acute intracranial abnormality.   Electronically Signed By: Jeb Levering M.D. On: 12/01/2015 19:31       I personally reviewed the radiologic studies    ED Course:  Patient's stay here was uneventful and the patient was started on aspirin therapy for what appears to be an acute stroke. There is no evidence of a hemorrhagic stroke. Patient presents well outside the window for TPA. She presents mostly with right upper extremity weakness and no obvious focal deficits in the lower extremities.    Assessment: * Acute cerebrovascular accident      Plan:  Inpatient management            Daymon Larsen, MD 12/01/15 2037

## 2015-12-01 NOTE — H&P (Signed)
PCP:   [name], MD   Chief Complaint:  Decreased motion right hand  HPI: This is a very pleasant 82 year old female with not much past medical history. She was seen in the ER last night, she fell approximately week ago and did not come to the ER. She's had back pain that was worse. Her family brought her to ER last night. Imaging was unrevealing. She woke up this a.m. and found she had difficulty moving her right arm. She states he was more of an increased numbness. This has persisted most of the day. She denies any other neurological symptomatology, including slurred speech or altered mentation, facial drooping essential. Her family became concerned and brought her to the ER.  Review of Systems:  The patient denies anorexia, fever, weight loss,, vision loss, decreased hearing, hoarseness, chest pain, syncope, dyspnea on exertion, peripheral edema, balance deficits, hemoptysis, abdominal pain, melena, hematochezia, severe indigestion/heartburn, hematuria, incontinence, genital sores, muscle weakness, suspicious skin lesions, transient blindness, difficulty walking, depression, unusual weight change, abnormal bleeding, enlarged lymph nodes, angioedema, and breast masses.  Past Medical History: Past Medical History  Diagnosis Date  . Gout   . Hypertension   . Hypothyroid   . Osteoarthritis   . Diverticulitis   . Cancer [facility]) skin   Past Surgical History  Procedure Laterality Date  . Cataract extraction    . Finger crystal removal      right 3rd  . Toe crystal removal      right 1st toe    Medications: Prior to Admission medications   Medication Sig Start Date End Date Taking? Authorizing Provider  allopurinol (ZYLOPRIM) 100 MG tablet Take 100 mg by mouth daily as needed (for gout flare up.).    Yes Historical Provider, MD  levothyroxine (SYNTHROID, LEVOTHROID) 150 MCG tablet Take 150 mcg by mouth daily before breakfast.   Yes Historical Provider, MD  losartan (COZAAR) 50  MG tablet Take 50 mg by mouth daily.   Yes Historical Provider, MD  acetaminophen (TYLENOL) 500 MG tablet Take 1 tablet (500 mg total) by mouth every 6 (six) hours as needed for moderate pain. 11/30/15   Jami L Hagler, PA-C    Allergies:  No Known Allergies  Social History:  reports that she has never smoked. She does not have any smokeless tobacco history on file. She reports that she does not drink alcohol. Her drug history is not on file. Resides at [facility] assisted living. She uses a walker. She lives with her 62 year old sister. She is not on home oxygen. Patient is very hard of hearing  Family History: Coronary artery disease  Physical Exam: Filed Vitals:   12/01/15 1642  BP: 155/104  Pulse: 88  Temp: 97.9 F (36.6 C)  TempSrc: Oral  Resp: 18  Weight: 57.153 kg (126 lb)  SpO2: 97%    General:  Alert and oriented times three, well developed and nourished, no acute distress Eyes: PERRLA, pink conjunctiva, no scleral icterus ENT: Moist oral mucosa, neck supple, no thyromegaly Lungs: clear to ascultation, no wheeze, no crackles, no use of accessory muscles Cardiovascular: regular rate and rhythm, no regurgitation, no gallops, no murmurs. No carotid bruits, no JVD Abdomen: soft, positive BS, non-tender, non-distended, no organomegaly, not an acute abdomen GU: not examined Neuro: CN II - XII grossly intact, sensation intact Musculoskeletal: strength 5/5 all extremities, no clubbing, cyanosis or edema. Grip and right hand slightly less than left but both decreased. Loss of arthritic changes in the joints. Sensation normal.  Skin: no rash, no subcutaneous crepitation, no decubitus Psych: appropriate patient   Labs on Admission:   Recent Labs  12/01/15 1837  NA 142  K 3.9  CL 108  CO2 25  GLUCOSE 108*  BUN 21*  CREATININE 0.97  CALCIUM 9.7    Recent Labs  12/01/15 1837  AST 27  ALT 13*  ALKPHOS 83  BILITOT 1.1  PROT 7.3  ALBUMIN 3.7   No results for  input(s): LIPASE, AMYLASE in the last 72 hours.  Recent Labs  12/01/15 1837  WBC 5.0  NEUTROABS 2.8  HGB 13.9  HCT 41.6  MCV 94.8  PLT 166    Recent Labs  12/01/15 1837  TROPONINI 0.04*   Invalid input(s): POCBNP No results for input(s): DDIMER in the last 72 hours. No results for input(s): HGBA1C in the last 72 hours. No results for input(s): CHOL, HDL, LDLCALC, TRIG, CHOLHDL, LDLDIRECT in the last 72 hours. No results for input(s): TSH, T4TOTAL, T3FREE, THYROIDAB in the last 72 hours.  Invalid input(s): FREET3 No results for input(s): VITAMINB12, FOLATE, FERRITIN, TIBC, IRON, RETICCTPCT in the last 72 hours.  Micro Results: No results found for this or any previous visit (from the past 240 hour(s)).   Radiological Exams on Admission: Dg Lumbar Spine 2-3 Views  11/30/2015  CLINICAL DATA:  Status post fall 1 week ago with onset of low back pain radiating into the coccyx. Initial encounter. EXAM: LUMBAR SPINE - 2-3 VIEW COMPARISON:  CT abdomen and pelvis 01/27/2015. FINDINGS: No acute abnormality is identified. Severe loss of disc space height is seen from L3-S1. Lower lumbar facet arthropathy is noted. Mild scoliosis is noted. Extensive atherosclerotic calcifications are seen. IMPRESSION: No acute abnormality. Multilevel spondylosis. Atherosclerosis. Electronically Signed   By: Inge Rise M.D.   On: 11/30/2015 13:23   Dg Sacrum/coccyx  11/30/2015  CLINICAL DATA:  Pain after falling in laundry room. EXAM: SACRUM AND COCCYX - 2+ VIEW COMPARISON:  Pelvis film from 11/04/2011. FINDINGS: Bones are diffusely demineralized. No evidence for sacral fracture. SI joints and symphysis pubis are normal. IMPRESSION: Negative. Electronically Signed   By: Misty Stanley M.D.   On: 11/30/2015 13:22   Ct Head Wo Contrast  12/01/2015  CLINICAL DATA:  82 year old female with right arm weakness. EXAM: CT HEAD WITHOUT CONTRAST TECHNIQUE: Contiguous axial images were obtained from the base of the  skull through the vertex without intravenous contrast. COMPARISON:  Head CT 08/29/2014 FINDINGS: Brain: Stable generalized atrophy.No intracranial hemorrhage, mass effect, or midline shift. No hydrocephalus. The basilar cisterns are patent. No evidence of territorial infarct. No intracranial fluid collection. Vascular: No hyperdense vessel or abnormal calcification. Atherosclerosis of skullbase vasculature. Skull:  Calvarium is intact. Sinuses/Orbits: Bilateral cataract extraction. Included paranasal sinuses and mastoid air cells are well aerated. Other: None. IMPRESSION: Stable generalized atrophy without acute intracranial abnormality. Electronically Signed   By: Jeb Levering M.D.   On: 12/01/2015 19:31    Assessment/Plan Present on Admission:  . TIA (transient ischemic attack) -Bring in for 23 HOUR observation on telemetry -2-D echo, MRI brain, carotid ultrasound ordered for the a.m. -Baby aspirin daily, lipid panel in a.m. -PT/OT consulted -Neurochecks every 2 hours  HTN uncontrolled -Home medications resumed, PR and Lopressor ordered -Blood pressure is elevated because of back pain  Back pain, likely secondary to fall -Imaging revealed osteoarthritis. Tylenol ordered scheduled 650 mg every 6 hours -PT consulted.  Hypothyroidism -Stable, resume home medications  Osteoarthritis -See above  Fall risk -PT consulted.  Brayla Pat 12/01/2015, 8:39 PM

## 2015-12-01 NOTE — ED Notes (Signed)
Patient transported to CT 

## 2015-12-02 ENCOUNTER — Observation Stay: Payer: Medicare Other

## 2015-12-02 ENCOUNTER — Observation Stay
Admit: 2015-12-02 | Discharge: 2015-12-02 | Disposition: A | Discharge status: Home / Self Care | Payer: Medicare Other | Attending: Family Medicine | Admitting: Family Medicine

## 2015-12-02 DIAGNOSIS — M1990 Unspecified osteoarthritis, unspecified site: Secondary | ICD-10-CM | POA: Diagnosis present

## 2015-12-02 DIAGNOSIS — I6523 Occlusion and stenosis of bilateral carotid arteries: Secondary | ICD-10-CM | POA: Diagnosis not present

## 2015-12-02 DIAGNOSIS — W19XXXA Unspecified fall, initial encounter: Secondary | ICD-10-CM | POA: Diagnosis present

## 2015-12-02 DIAGNOSIS — G459 Transient cerebral ischemic attack, unspecified: Secondary | ICD-10-CM | POA: Diagnosis not present

## 2015-12-02 DIAGNOSIS — Z8673 Personal history of transient ischemic attack (TIA), and cerebral infarction without residual deficits: Secondary | ICD-10-CM | POA: Diagnosis present

## 2015-12-02 DIAGNOSIS — E039 Hypothyroidism, unspecified: Secondary | ICD-10-CM | POA: Diagnosis present

## 2015-12-02 DIAGNOSIS — M549 Dorsalgia, unspecified: Secondary | ICD-10-CM | POA: Diagnosis present

## 2015-12-02 DIAGNOSIS — Z79899 Other long term (current) drug therapy: Secondary | ICD-10-CM | POA: Diagnosis not present

## 2015-12-02 DIAGNOSIS — I639 Cerebral infarction, unspecified: Principal | ICD-10-CM

## 2015-12-02 DIAGNOSIS — I10 Essential (primary) hypertension: Secondary | ICD-10-CM | POA: Diagnosis present

## 2015-12-02 DIAGNOSIS — R29898 Other symptoms and signs involving the musculoskeletal system: Secondary | ICD-10-CM | POA: Diagnosis not present

## 2015-12-02 LAB — LIPID PANEL
CHOLESTEROL: 196 mg/dL (ref 0–200)
HDL: 36 mg/dL — AB (ref >40)
LDL Cholesterol: 119 mg/dL — ABNORMAL HIGH (ref 0–99)
TRIGLYCERIDES: 203 mg/dL — AB (ref <150)
Total CHOL/HDL Ratio: 5.4 RATIO
VLDL: 41 mg/dL — ABNORMAL HIGH (ref 0–40)

## 2015-12-02 LAB — BASIC METABOLIC PANEL
Anion gap: 4 — ABNORMAL LOW (ref 5–15)
BUN: 22 mg/dL — ABNORMAL HIGH (ref 6–20)
CHLORIDE: 112 mmol/L — AB (ref 101–111)
CO2: 25 mmol/L (ref 22–32)
Calcium: 8.8 mg/dL — ABNORMAL LOW (ref 8.9–10.3)
Creatinine, Ser: 0.9 mg/dL (ref 0.44–1.00)
GFR calc Af Amer: >60 mL/min (ref >60)
GFR calc non Af Amer: 53 mL/min — ABNORMAL LOW (ref >60)
GLUCOSE: 112 mg/dL — AB (ref 65–99)
POTASSIUM: 3.6 mmol/L (ref 3.5–5.1)
Sodium: 141 mmol/L (ref 135–145)

## 2015-12-02 LAB — CBC
HEMATOCRIT: 36.3 % (ref 35.0–47.0)
Hemoglobin: 12.4 g/dL (ref 12.0–16.0)
MCH: 31.8 pg (ref 26.0–34.0)
MCHC: 34 g/dL (ref 32.0–36.0)
MCV: 93.4 fL (ref 80.0–100.0)
Platelets: 157 10*3/uL (ref 150–440)
RBC: 3.89 MIL/uL (ref 3.80–5.20)
RDW: 15.1 % — AB (ref 11.5–14.5)
WBC: 4.7 10*3/uL (ref 3.6–11.0)

## 2015-12-02 LAB — ECHOCARDIOGRAM COMPLETE
Height: 62 in
Weight: 2195.2 oz

## 2015-12-02 MED ORDER — ATORVASTATIN CALCIUM 20 MG PO TABS
40.0000 mg | ORAL_TABLET | Freq: Every day | ORAL | Status: DC
  Administered 2015-12-02: 40 mg via ORAL
  Filled 2015-12-02: qty 2

## 2015-12-02 MED ORDER — ATORVASTATIN CALCIUM 20 MG PO TABS: 20.0000 mg | ORAL_TABLET | Freq: Every day | ORAL | Status: DC

## 2015-12-02 MED ORDER — IBUPROFEN 400 MG PO TABS
400.0000 mg | ORAL_TABLET | Freq: Four times a day (QID) | ORAL | Status: DC | PRN
  Administered 2015-12-02: 400 mg via ORAL
  Filled 2015-12-02 (×2): qty 1

## 2015-12-02 MED ORDER — ACETAMINOPHEN 500 MG PO TABS: 500.0000 mg | ORAL_TABLET | Freq: Four times a day (QID) | ORAL | Status: DC | PRN

## 2015-12-02 MED ORDER — ASPIRIN 81 MG PO TBEC: 81.0000 mg | DELAYED_RELEASE_TABLET | Freq: Every day | ORAL | Status: DC

## 2015-12-02 NOTE — Care Management (Signed)
Admitted to [facility] under observation status with the diagnosis of TIA. Lives with her sister Mable at [facility] [place] x 10 years. Niece is Wilder Glade [phone]). Last seen Dr. Brunetta Genera about a month ago. Sees every 3 months. [facility] last September post hospitalization. Uses a motorized wheelchair to aid in getting around. Takes care of all basic activities of daily living herself. Family takes care of errands. Fell last Thursday. Good appetite. Family will transport. Shelbie Ammons RN MSN CCM Care Management [phone]

## 2015-12-02 NOTE — Progress Notes (Signed)
OT Cancellation Note  Patient Details Name: CAROLEENA DEDERICK MRN: [id number] DOB: 10-Mar-1919   Cancelled Treatment:    Reason Eval/Treat Not Completed: Patient at procedure or test/ unavailable   Harrel Carina, MS, OTR/L   Harrel Carina 12/02/2015, 9:48 AM

## 2015-12-02 NOTE — Discharge Instructions (Signed)

## 2015-12-02 NOTE — Progress Notes (Signed)
PT Cancellation Note  Patient Details Name: Ashley [name] MRN: [id number] DOB: March 15, 1919   Cancelled Treatment:    Reason Eval/Treat Not Completed: Patient at procedure or test/unavailable. Pt's chart reviewed. Upon PT's arrival pt is off of the floor for procedures and is not available. PT will f/u at a later time and complete evaluation when appropriate.   Neoma Laming, PT, DPT  12/02/2015, 10:48 AM [phone]

## 2015-12-02 NOTE — Consult Note (Signed)
PT appeared content/smiling w/family member at bedside; pt cited no pressing issues or concerns at this time. CH provided pastoral support and prayer.  Betances

## 2015-12-02 NOTE — Consult Note (Addendum)
Referring Physician: Sudini    Chief Complaint: Right hand numbness and weakness  HPI: Ashley [name] is an 82 y.o. female who reports that she was at her baseline on awakening yesterday.  Later in the morning after wiping of her car she noted that her right hand was numb and that her grip was weak.  With no improvement in her symptoms she presented for evaluation.  Initial NIHSS of 0.  Date last known well: 12/01/2015 Time last known well: Time: 09:30 tPA Given: No: Outside time window  Past Medical History  Diagnosis Date  . Gout   . Hypertension   . Hypothyroid   . Osteoarthritis   . Diverticulitis   . Cancer [facility]) skin    Past Surgical History  Procedure Laterality Date  . Cataract extraction    . Finger crystal removal      right 3rd  . Toe crystal removal      right 1st toe    No family history on file. Social History:  reports that she has never smoked. She does not have any smokeless tobacco history on file. She reports that she does not drink alcohol. Her drug history is not on file.  Allergies: No Known Allergies  Medications:  I have reviewed the patient's current medications. Prior to Admission:  Prescriptions prior to admission  Medication Sig Dispense Refill Last Dose  . allopurinol (ZYLOPRIM) 100 MG tablet Take 100 mg by mouth daily as needed (for gout flare up.).    prn at prn  . levothyroxine (SYNTHROID, LEVOTHROID) 150 MCG tablet Take 150 mcg by mouth daily before breakfast.   unknown at unknown  . losartan (COZAAR) 50 MG tablet Take 50 mg by mouth daily.   unknown at unknown  . acetaminophen (TYLENOL) 500 MG tablet Take 1 tablet (500 mg total) by mouth every 6 (six) hours as needed for moderate pain. 30 tablet 0 prn at prn   Scheduled: . acetaminophen  650 mg Oral Q6H  . aspirin EC  81 mg Oral Daily  . atorvastatin  40 mg Oral q1800  . enoxaparin (LOVENOX) injection  40 mg Subcutaneous Q24H  . levothyroxine  150 mcg Oral QAC breakfast  . losartan  50  mg Oral Daily  . sodium chloride flush  3 mL Intravenous Q12H    ROS: History obtained from the patient  General ROS: negative for - chills, fatigue, fever, night sweats, weight gain or weight loss Psychological ROS: negative for - behavioral disorder, hallucinations, memory difficulties, mood swings or suicidal ideation Ophthalmic ROS: negative for - blurry vision, double vision, eye pain or loss of vision ENT ROS: HOH Allergy and Immunology ROS: negative for - hives or itchy/watery eyes Hematological and Lymphatic ROS: negative for - bleeding problems, bruising or swollen lymph nodes Endocrine ROS: negative for - galactorrhea, hair pattern changes, polydipsia/polyuria or temperature intolerance Respiratory ROS: negative for - cough, hemoptysis, shortness of breath or wheezing Cardiovascular ROS: negative for - chest pain, dyspnea on exertion, edema or irregular heartbeat Gastrointestinal ROS: negative for - abdominal pain, diarrhea, hematemesis, nausea/vomiting or stool incontinence Genito-Urinary ROS: negative for - dysuria, hematuria, incontinence or urinary frequency/urgency Musculoskeletal ROS: joint pain, tailbone pain Neurological ROS: as noted in HPI Dermatological ROS: negative for rash and skin lesion changes  Physical Examination: Blood pressure 157/116, pulse 81, temperature 97.6 F (36.4 C), temperature source Oral, resp. rate 20, height 5\' 2"  (1.575 m), weight 62.234 kg (137 lb 3.2 oz), SpO2 98 %.  HEENT-  Normocephalic, no lesions, without obvious abnormality.  Normal external eye and conjunctiva.  Normal TM's bilaterally.  Normal auditory canals and external ears. Normal external nose, mucus membranes and septum.  Normal pharynx. Cardiovascular- S1, S2 normal, pulses palpable throughout   Lungs- chest clear, no wheezing, rales, normal symmetric air entry Abdomen- soft, non-tender; bowel sounds normal; no masses,  no organomegaly Extremities- no edema Lymph-no  adenopathy palpable Musculoskeletal-multiple small joint deformities Skin-warm and dry, no hyperpigmentation, vitiligo, or suspicious lesions  Neurological Examination Mental Status: Alert, oriented, thought content appropriate.  Speech fluent without evidence of aphasia.  Able to follow 3 step commands without difficulty. Cranial Nerves: II: Discs flat bilaterally; Visual fields grossly normal, pupils equal, round, reactive to light and accommodation III,IV, VI: ptosis not present, extra-ocular motions intact bilaterally V,VII: smile symmetric, facial light touch sensation normal bilaterally VIII: hearing decreased bilaterally IX,X: gag reflex present XI: bilateral shoulder shrug XII: midline tongue extension Motor: Right : Upper extremity   5/5 with 4/5 hand grip    Left:     Upper extremity   5/5  Lower extremity   5/5        Lower extremity   5/5 Tone and bulk:normal tone throughout; no atrophy noted Sensory: Pinprick and light touch decreased in the right hand Deep Tendon Reflexes: 2+ in the upper extremities and absent in the lower extremities Plantars: Right: upgoing   Left: downgoing Cerebellar: Normal finger-to-nose and normal heel-to-shin testing bilaterally Gait: not tested due to pain   Laboratory Studies:  Basic Metabolic Panel:  Recent Labs Lab 12/01/15 1837 12/02/15 0434  NA 142 141  K 3.9 3.6  CL 108 112*  CO2 25 25  GLUCOSE 108* 112*  BUN 21* 22*  CREATININE 0.97 0.90  CALCIUM 9.7 8.8*    Liver Function Tests:  Recent Labs Lab 12/01/15 1837  AST 27  ALT 13*  ALKPHOS 83  BILITOT 1.1  PROT 7.3  ALBUMIN 3.7   No results for input(s): LIPASE, AMYLASE in the last 168 hours. No results for input(s): AMMONIA in the last 168 hours.  CBC:  Recent Labs Lab 12/01/15 1837 12/02/15 0434  WBC 5.0 4.7  NEUTROABS 2.8  --   HGB 13.9 12.4  HCT 41.6 36.3  MCV 94.8 93.4  PLT 166 157    Cardiac Enzymes:  Recent Labs Lab 12/01/15 1837  12/01/15 2239  TROPONINI 0.04* 0.03*    BNP: Invalid input(s): POCBNP  CBG: No results for input(s): GLUCAP in the last 168 hours.  Microbiology: Results for orders placed or performed during the hospital encounter of 08/29/14  Urine culture     Status: None   Collection Time: 08/29/14  3:55 PM  Result Value Ref Range Status   Micro Text Report   Final       SOURCE: CLEAN CATCH    ORGANISM 1                >100,000 CFU/ML Escherichia coli   ANTIBIOTIC                    ORG#1     AMPICILLIN                    R         CEFAZOLIN                     S         CEFOXITIN  S         CEFTRIAXONE                   S         CIPROFLOXACIN                 S         GENTAMICIN                    S         IMIPENEM                      S         LEVOFLOXACIN                  S         NITROFURANTOIN                S         Trimethoprim/Sulfamethoxazole S             Coagulation Studies: No results for input(s): LABPROT, INR in the last 72 hours.  Urinalysis:  Recent Labs Lab 12/01/15 2155  COLORURINE STRAW*  LABSPEC 1.008  PHURINE 7.0  GLUCOSEU NEGATIVE  HGBUR 1+*  BILIRUBINUR NEGATIVE  KETONESUR NEGATIVE  PROTEINUR 100*  NITRITE NEGATIVE  LEUKOCYTESUR NEGATIVE    Lipid Panel:    Component Value Date/Time   CHOL 196 12/02/2015 0434   TRIG 203* 12/02/2015 0434   HDL 36* 12/02/2015 0434   CHOLHDL 5.4 12/02/2015 0434   VLDL 41* 12/02/2015 0434   LDLCALC 119* 12/02/2015 0434    HgbA1C: No results found for: HGBA1C  Urine Drug Screen:  No results found for: LABOPIA, COCAINSCRNUR, LABBENZ, AMPHETMU, THCU, LABBARB  Alcohol Level: No results for input(s): ETH in the last 168 hours.  Other results: EKG: sinus rhythm at 78 bpm with multiple PVC's.  Imaging: Dg Lumbar Spine 2-3 Views  11/30/2015  CLINICAL DATA:  Status post fall 1 week ago with onset of low back pain radiating into the coccyx. Initial encounter. EXAM: LUMBAR SPINE - 2-3 VIEW  COMPARISON:  CT abdomen and pelvis 01/27/2015. FINDINGS: No acute abnormality is identified. Severe loss of disc space height is seen from L3-S1. Lower lumbar facet arthropathy is noted. Mild scoliosis is noted. Extensive atherosclerotic calcifications are seen. IMPRESSION: No acute abnormality. Multilevel spondylosis. Atherosclerosis. Electronically Signed   By: Inge Rise M.D.   On: 11/30/2015 13:23   Dg Sacrum/coccyx  11/30/2015  CLINICAL DATA:  Pain after falling in laundry room. EXAM: SACRUM AND COCCYX - 2+ VIEW COMPARISON:  Pelvis film from 11/04/2011. FINDINGS: Bones are diffusely demineralized. No evidence for sacral fracture. SI joints and symphysis pubis are normal. IMPRESSION: Negative. Electronically Signed   By: Misty Stanley M.D.   On: 11/30/2015 13:22   Ct Head Wo Contrast  12/01/2015  CLINICAL DATA:  82 year old female with right arm weakness. EXAM: CT HEAD WITHOUT CONTRAST TECHNIQUE: Contiguous axial images were obtained from the base of the skull through the vertex without intravenous contrast. COMPARISON:  Head CT 08/29/2014 FINDINGS: Brain: Stable generalized atrophy.No intracranial hemorrhage, mass effect, or midline shift. No hydrocephalus. The basilar cisterns are patent. No evidence of territorial infarct. No intracranial fluid collection. Vascular: No hyperdense vessel or abnormal calcification. Atherosclerosis of skullbase vasculature. Skull:  Calvarium is intact. Sinuses/Orbits: Bilateral cataract extraction. Included paranasal sinuses and mastoid air cells are well aerated. Other:  None. IMPRESSION: Stable generalized atrophy without acute intracranial abnormality. Electronically Signed   By: Jeb Levering M.D.   On: 12/01/2015 19:31   Mr Brain Wo Contrast  12/02/2015  CLINICAL DATA:  Right arm weakness and numbness. EXAM: MRI HEAD WITHOUT CONTRAST TECHNIQUE: Multiplanar, multiecho pulse sequences of the brain and surrounding structures were obtained without intravenous  contrast. COMPARISON:  Head CT 12/01/2015 FINDINGS: There is a small acute to early subacute cortical and subcortical infarct predominantly involving the left precentral gyrus in the hand motor region with slight involvement of the postcentral gyrus as well. There is no evidence of intracranial hemorrhage, mass, midline shift, or extra-axial fluid collection. Mild generalized cerebral atrophy is within normal limits for age. Subcortical and periventricular cerebral white matter T2 hyperintensities are nonspecific but compatible with mild chronic small vessel ischemic disease. There is a chronic lacunar infarct in the right thalamus. Prior bilateral cataract extraction is noted. There is mild right maxillary sinus mucosal thickening. The mastoid air cells are clear. Major intracranial vascular flow voids are preserved. IMPRESSION: 1. Small acute to early subacute left perirolandic infarct. 2. Mild chronic small vessel ischemic disease. Electronically Signed   By: Logan Bores M.D.   On: 12/02/2015 10:09    Assessment: 82 y.o. female presenting with right hand weakness and numbness.  MRI of the brain personally reviewed and shows a small, left acute perirolandic infarct.  Echocardiogram and carotid dopplers are pending.  BP elevated.  LDL 119.  A1c pending.  Infarct likely secondary to small vessel disease.  On no antiplatelet therapy at home.    Stroke Risk Factors - hypertension  Plan: 1. Will follow up remaining work up that is still pending.   2. Prophylactic therapy-Antiplatelet med: Aspirin - dose 81mg  daily 3. Telemetry monitoring 4. Frequent neuro checks 5. PT and OT consults 6. Lipid lowering agents to be initiated with target LDL<70.      Alexis Goodell, MD Neurology [phone] 12/02/2015, 12:53 PM

## 2015-12-02 NOTE — Progress Notes (Addendum)
[facility] NAME: Ashley [name]    MR#:  [id number]  DATE OF BIRTH:  1918/07/21  SUBJECTIVE:  CHIEF COMPLAINT:   Chief Complaint  Patient presents with  . Numbness   Symptoms improved. Mild weakness in right hand grip.  REVIEW OF SYSTEMS:    Review of Systems  Constitutional: Negative for weight loss and malaise/fatigue.  Eyes: Negative for blurred vision and pain.  Respiratory: Negative for shortness of breath.   Cardiovascular: Negative for chest pain.  Genitourinary: Negative for dysuria.  Musculoskeletal: Negative for back pain.  Neurological: Positive for focal weakness. Negative for headaches.    DRUG ALLERGIES:  No Known Allergies  VITALS:  Blood pressure 157/116, pulse 81, temperature 97.6 F (36.4 C), temperature source Oral, resp. rate 20, height 5\' 2"  (1.575 m), weight 62.234 kg (137 lb 3.2 oz), SpO2 98 %.  PHYSICAL EXAMINATION:   Physical Exam  GENERAL:  82 y.o.-year-old patient lying in the bed with no acute distress. Decreased hearing EYES: Pupils equal, round, reactive to light and accommodation. No scleral icterus. Extraocular muscles intact.  HEENT: Head atraumatic, normocephalic. Oropharynx and nasopharynx clear.  NECK:  Supple, no jugular venous distention. No thyroid enlargement, no tenderness.  LUNGS: Normal breath sounds bilaterally, no wheezing, rales, rhonchi. No use of accessory muscles of respiration.  CARDIOVASCULAR: S1, S2 normal. No murmurs, rubs, or gallops.  ABDOMEN: Soft, nontender, nondistended. Bowel sounds present. No organomegaly or mass.  EXTREMITIES: No cyanosis, clubbing or edema b/l.    NEUROLOGIC: Cranial nerves II through XII are intact. Sensations intact. Right hand grip weakness. PSYCHIATRIC: The patient is alert and awake SKIN: No obvious rash, lesion, or ulcer.   LABORATORY PANEL:   CBC  Recent Labs Lab 12/02/15 0434  WBC 4.7  HGB 12.4  HCT 36.3  PLT 157    ------------------------------------------------------------------------------------------------------------------ Chemistries   Recent Labs Lab 12/01/15 1837 12/02/15 0434  NA 142 141  K 3.9 3.6  CL 108 112*  CO2 25 25  GLUCOSE 108* 112*  BUN 21* 22*  CREATININE 0.97 0.90  CALCIUM 9.7 8.8*  AST 27  --   ALT 13*  --   ALKPHOS 83  --   BILITOT 1.1  --    ------------------------------------------------------------------------------------------------------------------  Cardiac Enzymes  Recent Labs Lab 12/01/15 2239  TROPONINI 0.03*   ------------------------------------------------------------------------------------------------------------------  RADIOLOGY:  Ct Head Wo Contrast  12/01/2015  CLINICAL DATA:  82 year old female with right arm weakness. EXAM: CT HEAD WITHOUT CONTRAST TECHNIQUE: Contiguous axial images were obtained from the base of the skull through the vertex without intravenous contrast. COMPARISON:  Head CT 08/29/2014 FINDINGS: Brain: Stable generalized atrophy.No intracranial hemorrhage, mass effect, or midline shift. No hydrocephalus. The basilar cisterns are patent. No evidence of territorial infarct. No intracranial fluid collection. Vascular: No hyperdense vessel or abnormal calcification. Atherosclerosis of skullbase vasculature. Skull:  Calvarium is intact. Sinuses/Orbits: Bilateral cataract extraction. Included paranasal sinuses and mastoid air cells are well aerated. Other: None. IMPRESSION: Stable generalized atrophy without acute intracranial abnormality. Electronically Signed   By: Jeb Levering M.D.   On: 12/01/2015 19:31   Mr Brain Wo Contrast  12/02/2015  CLINICAL DATA:  Right arm weakness and numbness. EXAM: MRI HEAD WITHOUT CONTRAST TECHNIQUE: Multiplanar, multiecho pulse sequences of the brain and surrounding structures were obtained without intravenous contrast. COMPARISON:  Head CT 12/01/2015 FINDINGS: There is a small acute to early  subacute cortical and subcortical infarct predominantly involving the left precentral gyrus in  the hand motor region with slight involvement of the postcentral gyrus as well. There is no evidence of intracranial hemorrhage, mass, midline shift, or extra-axial fluid collection. Mild generalized cerebral atrophy is within normal limits for age. Subcortical and periventricular cerebral white matter T2 hyperintensities are nonspecific but compatible with mild chronic small vessel ischemic disease. There is a chronic lacunar infarct in the right thalamus. Prior bilateral cataract extraction is noted. There is mild right maxillary sinus mucosal thickening. The mastoid air cells are clear. Major intracranial vascular flow voids are preserved. IMPRESSION: 1. Small acute to early subacute left perirolandic infarct. 2. Mild chronic small vessel ischemic disease. Electronically Signed   By: Logan Bores M.D.   On: 12/02/2015 10:09   US Carotid Bilateral  12/02/2015  CLINICAL DATA:  Right arm weakness EXAM: BILATERAL CAROTID DUPLEX ULTRASOUND TECHNIQUE: Pearline Cables scale imaging, color Doppler and duplex ultrasound were performed of bilateral carotid and vertebral arteries in the neck. COMPARISON:  None. FINDINGS: Criteria: Quantification of carotid stenosis is based on velocity parameters that correlate the residual internal carotid diameter with NASCET-based stenosis levels, using the diameter of the distal internal carotid lumen as the denominator for stenosis measurement. The following velocity measurements were obtained: RIGHT ICA:  119 cm/sec CCA:  98 cm/sec SYSTOLIC ICA/CCA RATIO:  1.2 DIASTOLIC ICA/CCA RATIO:  1.6 ECA:  131 cm/sec LEFT ICA:  245 cm/sec CCA:  78 cm/sec SYSTOLIC ICA/CCA RATIO:  3.1 DIASTOLIC ICA/CCA RATIO:  3.7 ECA:  200 cm/sec RIGHT CAROTID ARTERY: There is moderate calcified plaque in the bulb. Low resistance internal carotid Doppler pattern. RIGHT VERTEBRAL ARTERY:  Antegrade. LEFT CAROTID ARTERY: Extensive  calcified plaque in the bulb. Low resistance internal carotid Doppler pattern. LEFT VERTEBRAL ARTERY:  Antegrade. IMPRESSION: There is less than 50% stenosis in the right internal carotid artery Greater than 70% stenosis in the left internal carotid artery. Electronically Signed   By: Marybelle Killings M.D.   On: 12/02/2015 13:53     ASSESSMENT AND PLAN:   * Acute left acute perirolandic infarct -Check MRI of the brain, Carotid dopplers, Echo - Start aspirin and statin. - Lovenox for DVT prophylaxis. - PT/OT/Speech consult as needed per symptoms - Neuro checks every 4 hours for 24 hours. - Consult neurology.  * Left carotid stenosis greater than 70% We'll await outpatient vascular follow-up  * Hypertension Blood pressure to run high post stroke Monitor  * DVT prophylaxis with Lovenox  All the records are reviewed and case discussed with Care Management/Social Workerr. Management plans discussed with the patient, family and they are in agreement.  CODE STATUS: DNR  TOTAL TIME TAKING CARE OF THIS PATIENT: 35 minutes.   POSSIBLE D/C IN 1-2 DAYS, DEPENDING ON CLINICAL CONDITION.  Hillary Bow R M.D on 12/02/2015 at 2:45 PM  Between 7am to 6pm - Pager - [phone]  After 6pm go to [email or web address] - password [email or web address] [facility]  Office  [phone]  CC: Primary care physician; [name], MD  Note: This dictation was prepared with Dragon dictation along with smaller phrase technology. Any transcriptional errors that result from this process are unintentional.

## 2015-12-02 NOTE — NC FL2 (Signed)
[place] LEVEL OF CARE SCREENING TOOL     IDENTIFICATION  Patient Name: Ashley [name] Birthdate: [birth year] Sex: female Admission Date (Current Location): 12/01/2015  [place] and [place] Number:  Engineering geologist and Address:  [facility], [address], [place], [place] [address]      Provider Number: [id number]  Attending Physician Name and Address:  Hillary Bow, MD  Relative Name and Phone Number:       Current Level of Care:   Recommended Level of Care:   Prior Approval Number:    Date Approved/Denied:   PASRR Number: [id number]  Discharge Plan: SNF    Current Diagnoses: Patient Active Problem List   Diagnosis Date Noted  . Acute CVA (cerebrovascular accident) ([place]) 12/02/2015  . CVA (cerebral infarction) 12/02/2015  . HTN (hypertension) 12/01/2015  . Gout 12/01/2015  . Fall 12/01/2015  . Osteoarthritis 12/01/2015  . Hypothyroidism 12/01/2015  . GI bleed 01/23/2015    Orientation RESPIRATION BLADDER Height & Weight     Self, Time, Situation, Place  Normal Continent Weight: 137 lb 3.2 oz (62.234 kg) Height:  5\' 2"  (157.5 cm)  BEHAVIORAL SYMPTOMS/MOOD NEUROLOGICAL BOWEL NUTRITION STATUS      Continent Diet (Heart Healthy)  AMBULATORY STATUS COMMUNICATION OF NEEDS Skin   Limited Assist Verbally Normal                       Personal Care Assistance Level of Assistance  Bathing, Feeding, Dressing Bathing Assistance: Limited assistance Feeding assistance: Limited assistance Dressing Assistance: Limited assistance     Functional Limitations Info  Sight, Hearing, Speech Sight Info: Adequate Hearing Info: Adequate Speech Info: Adequate    SPECIAL CARE FACTORS FREQUENCY  PT (By licensed PT), OT (By licensed OT)     PT Frequency: 5 OT Frequency: 5            Contractures      Additional Factors Info  Code Status, Allergies Code Status Info: DNR Allergies Info: No known allergies            Current Medications (12/02/2015):  This is the current hospital active medication list Current Facility-Administered Medications  Medication Dose Route Frequency Provider Last Rate Last Dose  . acetaminophen (TYLENOL) tablet 500 mg  500 mg Oral Q6H PRN Srikar Sudini, MD      . allopurinol (ZYLOPRIM) tablet 100 mg  100 mg Oral Daily PRN Debby Crosley, MD      . aspirin EC tablet 81 mg  81 mg Oral Daily Debby Crosley, MD   81 mg at 12/02/15 0909  . atorvastatin (LIPITOR) tablet 40 mg  40 mg Oral q1800 Srikar Sudini, MD      . enoxaparin (LOVENOX) injection 40 mg  40 mg Subcutaneous Q24H Debby Crosley, MD   40 mg at 12/01/15 2323  . ibuprofen (ADVIL,MOTRIN) tablet 400 mg  400 mg Oral Q6H PRN Hillary Bow, MD   400 mg at 12/02/15 1147  . levothyroxine (SYNTHROID, LEVOTHROID) tablet 150 mcg  150 mcg Oral QAC breakfast Quintella Baton, MD   150 mcg at 12/02/15 0909  . losartan (COZAAR) tablet 50 mg  50 mg Oral Daily Debby Crosley, MD   50 mg at 12/02/15 0909  . metoprolol (LOPRESSOR) injection 5 mg  5 mg Intravenous Q4H PRN Debby Crosley, MD   5 mg at 12/02/15 1443  . ondansetron (ZOFRAN) tablet 4 mg  4 mg Oral Q6H PRN Quintella Baton, MD  Or  . ondansetron (ZOFRAN) injection 4 mg  4 mg Intravenous Q6H PRN Debby Crosley, MD      . senna-docusate (Senokot-S) tablet 1 tablet  1 tablet Oral QHS PRN Debby Crosley, MD      . sodium chloride flush (NS) 0.9 % injection 3 mL  3 mL Intravenous Q12H Debby Crosley, MD   3 mL at 12/02/15 [id number]     Discharge Medications: Please see discharge summary for a list of discharge medications.  Relevant Imaging Results:  Relevant Lab Results:   Additional Information SSN:  [id number]  Darden Dates, LCSW

## 2015-12-02 NOTE — Progress Notes (Signed)
*  PRELIMINARY RESULTS* Echocardiogram 2D Echocardiogram has been performed.  Sherrie Sport 12/02/2015, 3:41 PM

## 2015-12-02 NOTE — Progress Notes (Signed)
Md made aware of new cramping pain in rt lower extremity as well as bp at 157/116. New orders placed.

## 2015-12-02 NOTE — Progress Notes (Signed)
PT Cancellation Note  Patient Details Name: Ashley [name] MRN: [id number] DOB: Oct 07, 1918   Cancelled Treatment:    Reason Eval/Treat Not Completed: Medical issues which prohibited therapy. Pt's chart reviewed. Her BP is currently elevated and is 157/116. Pt is currently not appropriate to participate in therapy at this time. PT will f/u at a later time and complete evaluation when appropriate.    Neoma Laming, PT, DPT  12/02/2015, 2:12 PM [phone]

## 2015-12-02 NOTE — Evaluation (Signed)
Occupational Therapy Evaluation Patient Details Name: Ashley [name] MRN: [id number] DOB: 1919-02-27 Today's Date: 12/02/2015    History of Present Illness Ashley [name] is an 82 y.o. female who reports that she was at her baseline on awakening yesterday. Later in the morning after wiping of her car she noted that her right hand was numb and that her grip was weak.   Clinical Impression   Pt. Is a 82 y.o. Female who was admitted to [facility] with a TIA. Verbal clearance was provided by the nurse to see pt. this afternoon. Pt. Presents with weakness, impaired right hand fine motor coordination skills, and limited balance which hinders her ability to complete ADL tasks efficiently. Pt. Could benefit from skilled OT services for ADL training, UE therapeutic ex., neuromuscular re-ed., pt. Ed in joint protection principles, and functional mobility for ADLs in order to work towards returning home to her independent living cottage at [employer].     Follow Up Recommendations  SNF    Equipment Recommendations       Recommendations for Other Services PT consult     Precautions / Restrictions Precautions Precautions: Fall      Mobility Bed Mobility    Independent              Transfers  Supervision                      Balance Overall balance assessment: Needs assistance   Sitting balance-Leahy Scale: Good       Standing balance-Leahy Scale: Fair                              ADL Overall ADL's : Needs assistance/impaired Eating/Feeding: Set up;Minimal assistance    Grooming: Minimal assistance;Set up               Lower Body Dressing: Minimal assistance               Functional mobility during ADLs: Min guard       Vision     Perception     Praxis      Pertinent Vitals/Pain       Hand Dominance     Extremity/Trunk Assessment Upper Extremity Assessment Upper Extremity Assessment: Generalized weakness (RUE: Intact sensation,  proprioception, and arthritic changes in right hand and digits. Pt. presents with impraired grip strength, pinch strength, and coordination.)           Communication Communication Communication: HOH (HAs Bilateral hearing aides.)   Cognition Arousal/Alertness: Awake/alert Behavior During Therapy: WFL for tasks assessed/performed Overall Cognitive Status: Within Functional Limits for tasks assessed                     General Comments       Exercises       Shoulder Instructions      Home Living Family/patient expects to be discharged to:: Private residence Living Arrangements: Other relatives Available Help at Discharge: Family Type of Home: House ([facility]) Home Access: Level entry     [place]: One level     Bathroom Shower/Tub: [place] unit;Curtain Shower/tub characteristics: Architectural technologist: [place]: Environmental consultant - 2 wheels;Shower seat          Prior Functioning/Environment Level of Independence: Independent             OT Diagnosis: Generalized weakness  OT Problem List: Decreased strength;Decreased range of motion;Decreased coordination;Decreased knowledge of use of DME or AE;Decreased safety awareness;Impaired UE functional use;Impaired balance (sitting and/or standing)   OT Treatment/Interventions: Self-care/ADL training;Therapeutic exercise;Therapeutic activities;Neuromuscular education;Patient/family education;DME and/or AE instruction    OT Goals(Current goals can be found in the care plan section) Acute Rehab OT Goals Patient Stated Goal: To return home OT Goal Formulation: With patient  OT Frequency: Min 1X/week   Barriers to D/C:            Co-evaluation              End of Session Equipment Utilized During Treatment: Gait belt  Activity Tolerance: Patient tolerated treatment well Patient left: in bed;with call bell/phone within reach;with bed alarm set   Time:  [id number] OT Time Calculation (min): 34 min Charges:  OT Evaluation $OT Eval Moderate Complexity: 1 Procedure OT Treatments $Self Care/Home Management : 8-22 mins G-Codes:    Harrel Carina, MS, OTR/L Harrel Carina 12/02/2015, 3:15 PM

## 2015-12-03 DIAGNOSIS — I6529 Occlusion and stenosis of unspecified carotid artery: Secondary | ICD-10-CM | POA: Diagnosis present

## 2015-12-03 DIAGNOSIS — G459 Transient cerebral ischemic attack, unspecified: Secondary | ICD-10-CM | POA: Diagnosis not present

## 2015-12-03 HISTORY — DX: Occlusion and stenosis of unspecified carotid artery: I65.29

## 2015-12-03 LAB — HEMOGLOBIN A1C: Hgb A1c MFr Bld: 6.7 % — ABNORMAL HIGH (ref 4.0–6.0)

## 2015-12-03 NOTE — Progress Notes (Signed)
Discussed discharge instructions and medications with pt and her niece at bedside.  IV removed.  All questions addressed.  Pt transported home via car by niece.  Clarise Cruz, RN

## 2015-12-03 NOTE — Progress Notes (Signed)
Occupational Therapy Treatment Patient Details Name: Ashley [name] MRN: [id number] DOB: 01-17-1919 Today's Date: 12/03/2015    History of present illness Ashley [name] is a 82 yo Female who came to ED after a fall and then reports increased RUE numbness and weakness. MRI shows a small left acute perirolandic infarct. Patient denies any numbness in BLE and reports only new deficits in right hand. She is right handed.    OT comments  Met with SW , and family , pt in room - family had concerns about pt to go home - did get PT to discuss findings of eval and  Pt with PT again - was able to show that she can get safely up out of chair - and then with OT could ambulate and steer walker around bed into BR - showed no LOB and good safety and use of R hand in ADL's -pulling up and down pants , washing hands at sink -  pt to have arthritic changes in hands - and together with some sensory issues having strength and coordination issues - was able to hold water cup -  for eating advice to get her builtup handles for fork, knife - to ease gripping utencils -   Follow Up Recommendations  Home health OT    Equipment Recommendations       Recommendations for Other Services      Precautions / Restrictions Precautions Precautions: Fall Restrictions Weight Bearing Restrictions: No       Mobility Bed Mobility Overal bed mobility: Modified Independent             General bed mobility comments: uses bed rails, but able to get up mod I without assistance;   Transfers Overall transfer level: Modified independent Equipment used: Rolling walker (2 wheeled)             General transfer comment: patient transfers sit<>Stand with good safety awareness using bed and chair rails to push up/off of.     Balance   Sitting-balance support: No upper extremity supported Sitting balance-Leahy Scale: Good       Standing balance-Leahy Scale: Fair Standing balance comment: able to stand with feet close  together without AD, with eyes open/closed without loss of balance;                    ADL                                                Vision                     Perception     Praxis      Cognition   Behavior During Therapy: WFL for tasks assessed/performed Overall Cognitive Status: Within Functional Limits for tasks assessed                       Extremity/Trunk Assessment  Upper Extremity Assessment Upper Extremity Assessment: Defer to OT evaluation ([facility] is [facility])   Lower Extremity Assessment Lower Extremity Assessment: RLE deficits/detail;LLE deficits/detail RLE Deficits / Details: intact light touch sensation; gross strength 4+/5 LLE Deficits / Details: intact light touch sensation; gross strength 4+/5   Cervical / Trunk Assessment Cervical / Trunk Assessment: Kyphotic    Exercises Other Exercises Other Exercises: Ambulate to BR using RW - no  LOB, good steering of RW around bed, into bathroom - sit<> stand off toilet -Independent - all S Other Exercises: Able to use R hand in pulling up and down panties, washing hands - reaching with R hand to papertowel and drying hands Other Exercises: Able to hold water with R hand , reaching for blanket  to put around shoulders Other Exercises: She reports feeling numb feeling from wrist to fingers - but able to feel light touch from OT - pt to have arthritic changes in hand - did a lot fo crochet - discuss with pt and nieces to builtup handles of untencils and toothbrush - to make grip  easier    Shoulder Instructions       General Comments      Pertinent Vitals/ Pain       Pain Assessment: No/denies pain  Home Living Family/patient expects to be discharged to:: Private residence Living Arrangements: Other relatives Available Help at Discharge: Family Type of Home: Assisted living Home Access: Level entry     Home Layout: One level     Bathroom Shower/Tub: Tub/shower  unit;Curtain   Biochemist, clinical: [place]     Home Equipment: Environmental consultant - 2 wheels;Shower seat;Wheelchair - power          Prior Functioning/Environment Level of Independence: Independent with assistive device(s)        Comments: used RW for gait tasks; used power chair when going in the community; lives with sister at [facility]; She is mod I for basic ADLs;    Frequency Min 1X/week     Progress Toward Goals  OT Goals(current goals can now be found in the care plan section)  Progress towards OT goals: Progressing toward goals  Acute Rehab OT Goals Patient Stated Goal: to return home OT Goal Formulation: With patient/family  Plan      Co-evaluation                 End of Session Equipment Utilized During Treatment: Gait belt   Activity Tolerance Patient tolerated treatment well   Patient Left in chair;with call bell/phone within reach;with chair alarm set;with family/visitor present   Nurse Communication          Time: 4010-2725 OT Time Calculation (min): 42 min  Charges: OT General Charges $OT Visit: 1 Procedure OT Treatments $Self Care/Home Management : 8-22 mins $Therapeutic Activity: 23-37 mins  Yomaira Solar OTR/L,CLT  12/03/2015, 12:36 PM

## 2015-12-03 NOTE — Progress Notes (Signed)
Subjective: Patient continues to have numbness in her right hand.    Objective: Current vital signs: BP 159/63 mmHg  Pulse 68  Temp(Src) 98 F (36.7 C) (Oral)  Resp 16  Ht 5\' 2"  (1.575 m)  Wt 62.234 kg (137 lb 3.2 oz)  BMI 25.09 kg/m2  SpO2 98% Vital signs in last 24 hours: Temp:  [97.8 F (36.6 C)-98 F (36.7 C)] 98 F (36.7 C) (07/08 0444) Pulse Rate:  [57-72] 68 (07/08 0444) Resp:  [16] 16 (07/08 0444) BP: (155-173)/(60-77) 159/63 mmHg (07/08 0444) SpO2:  [97 %-100 %] 98 % (07/08 0444)  Intake/Output from previous day: 07/07 0701 - 07/08 0700 In: 480 [P.O.:480] Out: 550 [Urine:550] Intake/Output this shift: Total I/O In: 240 [P.O.:240] Out: 150 [Urine:150] Nutritional status: Diet Heart Room service appropriate?: Yes; Fluid consistency:: Thin  Neurologic Exam: Mental Status: Alert, oriented, thought content appropriate. Speech fluent without evidence of aphasia. Able to follow 3 step commands without difficulty. Cranial Nerves: II: Discs flat bilaterally; Visual fields grossly normal, pupils equal, round, reactive to light and accommodation III,IV, VI: ptosis not present, extra-ocular motions intact bilaterally V,VII: smile symmetric, facial light touch sensation normal bilaterally VIII: hearing decreased bilaterally IX,X: gag reflex present XI: bilateral shoulder shrug XII: midline tongue extension Motor: Right :Upper extremity 5/5 with improved gripstrengthLeft: Upper extremity 5/5 Lower extremity 5/5 Lower extremity 5/5 Tone and bulk:normal tone throughout; no atrophy noted Sensory: Pinprick and light touch decreased in the right hand   Lab Results: Basic Metabolic Panel:  Recent Labs Lab 12/01/15 1837 12/02/15 0434  NA 142 141  K 3.9 3.6  CL 108 112*  CO2 25 25  GLUCOSE 108* 112*  BUN 21* 22*   CREATININE 0.97 0.90  CALCIUM 9.7 8.8*    Liver Function Tests:  Recent Labs Lab 12/01/15 1837  AST 27  ALT 13*  ALKPHOS 83  BILITOT 1.1  PROT 7.3  ALBUMIN 3.7   No results for input(s): LIPASE, AMYLASE in the last 168 hours. No results for input(s): AMMONIA in the last 168 hours.  CBC:  Recent Labs Lab 12/01/15 1837 12/02/15 0434  WBC 5.0 4.7  NEUTROABS 2.8  --   HGB 13.9 12.4  HCT 41.6 36.3  MCV 94.8 93.4  PLT 166 157    Cardiac Enzymes:  Recent Labs Lab 12/01/15 1837 12/01/15 2239  TROPONINI 0.04* 0.03*    Lipid Panel:  Recent Labs Lab 12/02/15 0434  CHOL 196  TRIG 203*  HDL 36*  CHOLHDL 5.4  VLDL 41*  LDLCALC 119*    CBG: No results for input(s): GLUCAP in the last 168 hours.  Microbiology: Results for orders placed or performed during the hospital encounter of 08/29/14  Urine culture     Status: None   Collection Time: 08/29/14  3:55 PM  Result Value Ref Range Status   Micro Text Report   Final       SOURCE: CLEAN CATCH    ORGANISM 1                >100,000 CFU/ML Escherichia coli   ANTIBIOTIC                    ORG#1     AMPICILLIN                    R         CEFAZOLIN  S         CEFOXITIN                     S         CEFTRIAXONE                   S         CIPROFLOXACIN                 S         GENTAMICIN                    S         IMIPENEM                      S         LEVOFLOXACIN                  S         NITROFURANTOIN                S         Trimethoprim/Sulfamethoxazole S             Coagulation Studies: No results for input(s): LABPROT, INR in the last 72 hours.  Imaging: Ct Head Wo Contrast  12/01/2015  CLINICAL DATA:  82 year old female with right arm weakness. EXAM: CT HEAD WITHOUT CONTRAST TECHNIQUE: Contiguous axial images were obtained from the base of the skull through the vertex without intravenous contrast. COMPARISON:  Head CT 08/29/2014 FINDINGS: Brain: Stable generalized  atrophy.No intracranial hemorrhage, mass effect, or midline shift. No hydrocephalus. The basilar cisterns are patent. No evidence of territorial infarct. No intracranial fluid collection. Vascular: No hyperdense vessel or abnormal calcification. Atherosclerosis of skullbase vasculature. Skull:  Calvarium is intact. Sinuses/Orbits: Bilateral cataract extraction. Included paranasal sinuses and mastoid air cells are well aerated. Other: None. IMPRESSION: Stable generalized atrophy without acute intracranial abnormality. Electronically Signed   By: Jeb Levering M.D.   On: 12/01/2015 19:31   Mr Brain Wo Contrast  12/02/2015  CLINICAL DATA:  Right arm weakness and numbness. EXAM: MRI HEAD WITHOUT CONTRAST TECHNIQUE: Multiplanar, multiecho pulse sequences of the brain and surrounding structures were obtained without intravenous contrast. COMPARISON:  Head CT 12/01/2015 FINDINGS: There is a small acute to early subacute cortical and subcortical infarct predominantly involving the left precentral gyrus in the hand motor region with slight involvement of the postcentral gyrus as well. There is no evidence of intracranial hemorrhage, mass, midline shift, or extra-axial fluid collection. Mild generalized cerebral atrophy is within normal limits for age. Subcortical and periventricular cerebral white matter T2 hyperintensities are nonspecific but compatible with mild chronic small vessel ischemic disease. There is a chronic lacunar infarct in the right thalamus. Prior bilateral cataract extraction is noted. There is mild right maxillary sinus mucosal thickening. The mastoid air cells are clear. Major intracranial vascular flow voids are preserved. IMPRESSION: 1. Small acute to early subacute left perirolandic infarct. 2. Mild chronic small vessel ischemic disease. Electronically Signed   By: Logan Bores M.D.   On: 12/02/2015 10:09   US Carotid Bilateral  12/02/2015  CLINICAL DATA:  Right arm weakness EXAM: BILATERAL  CAROTID DUPLEX ULTRASOUND TECHNIQUE: Pearline Cables scale imaging, color Doppler and duplex ultrasound were performed of bilateral carotid and vertebral arteries in the neck. COMPARISON:  None. FINDINGS: Criteria: Quantification of carotid stenosis is based on  velocity parameters that correlate the residual internal carotid diameter with NASCET-based stenosis levels, using the diameter of the distal internal carotid lumen as the denominator for stenosis measurement. The following velocity measurements were obtained: RIGHT ICA:  119 cm/sec CCA:  98 cm/sec SYSTOLIC ICA/CCA RATIO:  1.2 DIASTOLIC ICA/CCA RATIO:  1.6 ECA:  131 cm/sec LEFT ICA:  245 cm/sec CCA:  78 cm/sec SYSTOLIC ICA/CCA RATIO:  3.1 DIASTOLIC ICA/CCA RATIO:  3.7 ECA:  200 cm/sec RIGHT CAROTID ARTERY: There is moderate calcified plaque in the bulb. Low resistance internal carotid Doppler pattern. RIGHT VERTEBRAL ARTERY:  Antegrade. LEFT CAROTID ARTERY: Extensive calcified plaque in the bulb. Low resistance internal carotid Doppler pattern. LEFT VERTEBRAL ARTERY:  Antegrade. IMPRESSION: There is less than 50% stenosis in the right internal carotid artery Greater than 70% stenosis in the left internal carotid artery. Electronically Signed   By: Marybelle Killings M.D.   On: 12/02/2015 13:53    Medications:  I have reviewed the patient's current medications. Scheduled: . aspirin EC  81 mg Oral Daily  . atorvastatin  40 mg Oral q1800  . enoxaparin (LOVENOX) injection  40 mg Subcutaneous Q24H  . levothyroxine  150 mcg Oral QAC breakfast  . losartan  50 mg Oral Daily  . sodium chloride flush  3 mL Intravenous Q12H    Assessment/Plan: Numbness continues. Carotid dopplers show no evidence of hemodynamically significant stenosis on the right, [id number] LICA stenosis.  Echocardiogram shows no cardiac source of emboli with an EF of 40%.  A1c 6.7, LDL 119.  Patient started on ASA and a statin.  Have discussed with family possible options for LICA stenosis since patient is  symptomatic from that lesion.  They will be following up with vascular.     LOS: 1 day   Alexis Goodell, MD Neurology [phone] 12/03/2015  11:33 AM

## 2015-12-03 NOTE — Evaluation (Signed)
Physical Therapy Evaluation Patient Details Name: Ashley [name] MRN: [id number] DOB: 05-Mar-1919 Today's Date: 12/03/2015   History of Present Illness  Ashley [name] is a 82 yo Female who came to ED after a fall and then reports increased RUE numbness and weakness. MRI shows a small left acute perirolandic infarct. Patient denies any numbness in BLE and reports only new deficits in right hand. She is right handed.   Clinical Impression  82 yo Female came to ED with RUE weakness and numbness. Patient had MRI which showed a small infarct. Patient reports being mod I for self care ADLs prior to admittance. She used a RW and power chair for ambulation. Patient lived with her sister at the [facility] [place] living. She is hard of hearing. Patient is mod I for bed mobility and transfers. She demonstrates good safety awareness. Her balance is fair. Patient ambulated with RW 150 feet with supervision demonstrating good safety awareness. Her gait speed is a little slower but patient reports that this is her baseline. She reports her only new symptoms of Right hand weakness. Patient is currently functioning at baseline and is not appropriate for additional skilled needs of physical therapy at this time.     Follow Up Recommendations No PT follow up    Equipment Recommendations  None recommended by PT    Recommendations for Other Services       Precautions / Restrictions Precautions Precautions: Fall Restrictions Weight Bearing Restrictions: No      Mobility  Bed Mobility Overal bed mobility: Modified Independent             General bed mobility comments: uses bed rails, but able to get up mod I without assistance;   Transfers Overall transfer level: Modified independent Equipment used: Rolling walker (2 wheeled)             General transfer comment: patient transfers sit<>Stand with good safety awareness using bed and chair rails to push up/off of.    Ambulation/Gait Ambulation/Gait assistance: Supervision Ambulation Distance (Feet): 150 Feet Assistive device: Rolling walker (2 wheeled) Gait Pattern/deviations: Step-through pattern;Decreased step length - right;Decreased step length - left;Trunk flexed Gait velocity: 1.42 feet/sec   General Gait Details: slower gait speed, but safe with RW; demonstrates good balance without unsteadiness; Patient reports her walking is at her baseline;   Financial trader Rankin (Stroke Patients Only)       Balance   Sitting-balance support: No upper extremity supported Sitting balance-Leahy Scale: Good       Standing balance-Leahy Scale: Fair Standing balance comment: able to stand with feet close together without AD, with eyes open/closed without loss of balance;                              Pertinent Vitals/Pain Pain Assessment: No/denies pain    Home Living Family/patient expects to be discharged to:: Private residence Living Arrangements: Other relatives Available Help at Discharge: Family Type of Home: Assisted living Home Access: Level entry     Home Layout: One level Home Equipment: Environmental consultant - 2 wheels;Shower seat;Wheelchair - power      Prior Function Level of Independence: Independent with assistive device(s)         Comments: used RW for gait tasks; used power chair when going in the community; lives with sister at [facility] [place]; She is  mod I for basic ADLs;      Hand Dominance   Dominant Hand: Right    Extremity/Trunk Assessment   Upper Extremity Assessment: Defer to OT evaluation ([facility] is [facility])           Lower Extremity Assessment: RLE deficits/detail;LLE deficits/detail RLE Deficits / Details: intact light touch sensation; gross strength 4+/5 LLE Deficits / Details: intact light touch sensation; gross strength 4+/5  Cervical / Trunk Assessment: Kyphotic  Communication   Communication:  HOH  Cognition Arousal/Alertness: Awake/alert Behavior During Therapy: WFL for tasks assessed/performed Overall Cognitive Status: Within Functional Limits for tasks assessed                      General Comments      Exercises        Assessment/Plan    PT Assessment Patent does not need any further PT services  PT Diagnosis Difficulty walking   PT Problem List    PT Treatment Interventions     PT Goals (Current goals can be found in the Care Plan section) Acute Rehab PT Goals Patient Stated Goal: to return home PT Goal Formulation: With patient Time For Goal Achievement: 12/03/15 Potential to Achieve Goals: Good    Frequency     Barriers to discharge        Co-evaluation               End of Session Equipment Utilized During Treatment: Gait belt Activity Tolerance: Patient tolerated treatment well Patient left: in chair;with call bell/phone within reach;with chair alarm set           Time: [id number] PT Time Calculation (min) (ACUTE ONLY): 14 min   Charges:   PT Evaluation $PT Eval Low Complexity: 1 Procedure     PT G Codes:        Trotter,Margaret PT, DPT 12/03/2015, 8:46 AM

## 2015-12-03 NOTE — Patient Instructions (Signed)
Education done and recommendations to family and pt's for AE

## 2015-12-03 NOTE — Care Management Note (Signed)
Case Management Note  Patient Details  Name: Ashley [name] MRN: [id number] Date of Birth: 01/07/1919  Subjective/Objective:   Discussed discharge planning with niece Trenda Moots. [name] chose [facility] from list of providers. A referral was faxed to [facility] requesting home health PT and OT.                  Action/Plan:   Expected Discharge Date:                  Expected Discharge Plan:     In-House Referral:     Discharge planning Services     Post Acute Care Choice:    Choice offered to:     DME Arranged:    DME Agency:     HH Arranged:    HH Agency:     Status of Service:     If discussed at [employer] of Stay Meetings, dates discussed:    Additional Comments:  [name] A, RN 12/03/2015, 9:47 AM

## 2015-12-05 DIAGNOSIS — I10 Essential (primary) hypertension: Secondary | ICD-10-CM | POA: Diagnosis not present

## 2015-12-05 DIAGNOSIS — M109 Gout, unspecified: Secondary | ICD-10-CM | POA: Diagnosis not present

## 2015-12-05 DIAGNOSIS — R2689 Other abnormalities of gait and mobility: Secondary | ICD-10-CM | POA: Diagnosis not present

## 2015-12-05 DIAGNOSIS — I69331 Monoplegia of upper limb following cerebral infarction affecting right dominant side: Secondary | ICD-10-CM | POA: Diagnosis not present

## 2015-12-05 DIAGNOSIS — R531 Weakness: Secondary | ICD-10-CM | POA: Diagnosis not present

## 2015-12-05 DIAGNOSIS — M1990 Unspecified osteoarthritis, unspecified site: Secondary | ICD-10-CM | POA: Diagnosis not present

## 2015-12-07 DIAGNOSIS — I69331 Monoplegia of upper limb following cerebral infarction affecting right dominant side: Secondary | ICD-10-CM | POA: Diagnosis not present

## 2015-12-07 DIAGNOSIS — I10 Essential (primary) hypertension: Secondary | ICD-10-CM | POA: Diagnosis not present

## 2015-12-07 DIAGNOSIS — R531 Weakness: Secondary | ICD-10-CM | POA: Diagnosis not present

## 2015-12-07 DIAGNOSIS — M109 Gout, unspecified: Secondary | ICD-10-CM | POA: Diagnosis not present

## 2015-12-07 DIAGNOSIS — I639 Cerebral infarction, unspecified: Secondary | ICD-10-CM | POA: Diagnosis not present

## 2015-12-07 DIAGNOSIS — R5383 Other fatigue: Secondary | ICD-10-CM | POA: Diagnosis not present

## 2015-12-07 DIAGNOSIS — R2689 Other abnormalities of gait and mobility: Secondary | ICD-10-CM | POA: Diagnosis not present

## 2015-12-07 DIAGNOSIS — E785 Hyperlipidemia, unspecified: Secondary | ICD-10-CM | POA: Diagnosis not present

## 2015-12-07 DIAGNOSIS — M1990 Unspecified osteoarthritis, unspecified site: Secondary | ICD-10-CM | POA: Diagnosis not present

## 2015-12-08 DIAGNOSIS — R531 Weakness: Secondary | ICD-10-CM | POA: Diagnosis not present

## 2015-12-08 DIAGNOSIS — M1990 Unspecified osteoarthritis, unspecified site: Secondary | ICD-10-CM | POA: Diagnosis not present

## 2015-12-08 DIAGNOSIS — R2689 Other abnormalities of gait and mobility: Secondary | ICD-10-CM | POA: Diagnosis not present

## 2015-12-08 DIAGNOSIS — I69331 Monoplegia of upper limb following cerebral infarction affecting right dominant side: Secondary | ICD-10-CM | POA: Diagnosis not present

## 2015-12-08 DIAGNOSIS — M109 Gout, unspecified: Secondary | ICD-10-CM | POA: Diagnosis not present

## 2015-12-08 DIAGNOSIS — I10 Essential (primary) hypertension: Secondary | ICD-10-CM | POA: Diagnosis not present

## 2015-12-08 NOTE — Discharge Summary (Signed)
[facility] [place] NAME: Ashley [name]    MR#:  [id number]  DATE OF BIRTH:  [birth year]  DATE OF ADMISSION:  12/01/2015 ADMITTING PHYSICIAN: Quintella Baton, MD  DATE OF DISCHARGE: 12/03/2015  1:16 PM  PRIMARY CARE PHYSICIAN: [name], MD   ADMISSION DIAGNOSIS:  Acute CVA (cerebrovascular accident) ([place]) [I63.9]  DISCHARGE DIAGNOSIS:  Active Problems:   HTN (hypertension)   Gout   Fall   Osteoarthritis   Hypothyroidism   Acute CVA (cerebrovascular accident) ([place])   CVA (cerebral infarction)   Left carotid stenosis   SECONDARY DIAGNOSIS:   Past Medical History  Diagnosis Date  . Gout   . Hypertension   . Hypothyroid   . Osteoarthritis   . Diverticulitis   . Cancer [facility]) skin     ADMITTING HISTORY  HPI: This is a very pleasant 82 year old female with not much past medical history. She was seen in the ER last night, she fell approximately week ago and did not come to the ER. She's had back pain that was worse. Her family brought her to ER last night. Imaging was unrevealing. She woke up this a.m. and found she had difficulty moving her right arm. She states he was more of an increased numbness. This has persisted most of the day. She denies any other neurological symptomatology, including slurred speech or altered mentation, facial drooping essential. Her family became concerned and brought her to the ER.  HOSPITAL COURSE:   * Acute left acute perirolandic infarct - MRI confirmed finding. Echocardiogram showednothing acute. - Carotid Doppler showed 70% left carotid stenosis- Likely cause of the stroke. - Start aspirin and statin. - Lovenox for DVT prophylaxis. - PT/OT/Speech consult . Home health recommended and has been set up - neurology input appreciated  * Left carotid stenosis greater than 70% We'll await outpatient vascular follow-up Family not keen on any intervention.  * Hypertension Blood pressure  to run high post stroke Monitor. Resume home medications at discharge  * DVT prophylaxis with Lovenox In hospital  Stable for discharge back to ALF  CONSULTS OBTAINED:  Treatment Team:  Catarina Hartshorn, MD  DRUG ALLERGIES:  No Known Allergies  DISCHARGE MEDICATIONS:   Discharge Medication List as of 12/03/2015 11:52 AM    START taking these medications   Details  aspirin EC 81 MG EC tablet Take 1 tablet (81 mg total) by mouth daily., Starting 12/02/2015, Until Discontinued, OTC    atorvastatin (LIPITOR) 20 MG tablet Take 1 tablet (20 mg total) by mouth daily at 6 PM., Starting 12/02/2015, Until Discontinued, Normal      CONTINUE these medications which have NOT CHANGED   Details  allopurinol (ZYLOPRIM) 100 MG tablet Take 100 mg by mouth daily as needed (for gout flare up.). , Until Discontinued, Historical Med    levothyroxine (SYNTHROID, LEVOTHROID) 150 MCG tablet Take 150 mcg by mouth daily before breakfast., Until Discontinued, Historical Med    losartan (COZAAR) 50 MG tablet Take 50 mg by mouth daily., Until Discontinued, Historical Med    acetaminophen (TYLENOL) 500 MG tablet Take 1 tablet (500 mg total) by mouth every 6 (six) hours as needed for moderate pain., Starting 11/30/2015, Until Discontinued, Print        Today   VITAL SIGNS:  Blood pressure 159/63, pulse 68, temperature 98 F (36.7 C), temperature source Oral, resp. rate 16, height 5\' 2"  (1.575 m), weight 62.234 kg (137 lb 3.2 oz), SpO2 98 %.  I/O:  No intake or output data in the 24 hours ending 12/08/15 0852  PHYSICAL EXAMINATION:  Physical Exam  GENERAL:  82 y.o.-year-old patient lying in the bed with no acute distress. Decreased hearing LUNGS: Normal breath sounds bilaterally, no wheezing, rales,rhonchi or crepitation. No use of accessory muscles of respiration.  CARDIOVASCULAR: S1, S2 normal. No murmurs, rubs, or gallops.  ABDOMEN: Soft, non-tender, non-distended. Bowel sounds present. No  organomegaly or mass.  NEUROLOGIC: Moves all 4 extremities. Weakness right hand. PSYCHIATRIC: The patient is alert and awake.  SKIN: No obvious rash, lesion, or ulcer.   DATA REVIEW:   CBC  Recent Labs Lab 12/02/15 0434  WBC 4.7  HGB 12.4  HCT 36.3  PLT 157    Chemistries   Recent Labs Lab 12/01/15 1837 12/02/15 0434  NA 142 141  K 3.9 3.6  CL 108 112*  CO2 25 25  GLUCOSE 108* 112*  BUN 21* 22*  CREATININE 0.97 0.90  CALCIUM 9.7 8.8*  AST 27  --   ALT 13*  --   ALKPHOS 83  --   BILITOT 1.1  --     Cardiac Enzymes  Recent Labs Lab 12/01/15 2239  TROPONINI 0.03*    Microbiology Results  Results for orders placed or performed during the hospital encounter of 08/29/14  Urine culture     Status: None   Collection Time: 08/29/14  3:55 PM  Result Value Ref Range Status   Micro Text Report   Final       SOURCE: CLEAN CATCH    ORGANISM 1                >100,000 CFU/ML Escherichia coli   ANTIBIOTIC                    ORG#1     AMPICILLIN                    R         CEFAZOLIN                     S         CEFOXITIN                     S         CEFTRIAXONE                   S         CIPROFLOXACIN                 S         GENTAMICIN                    S         IMIPENEM                      S         LEVOFLOXACIN                  S         NITROFURANTOIN                S         Trimethoprim/Sulfamethoxazole S             RADIOLOGY:  No results found.  Follow up with PCP in 1 week.  Management plans discussed with the patient, family and they are in agreement.  CODE STATUS:  Code Status History    Date Active Date Inactive Code Status Order ID Comments User Context   12/01/2015  9:43 PM 12/03/2015  5:31 PM DNR [id number]  Quintella Baton, MD Inpatient   01/23/2015 10:05 PM 01/29/2015  2:52 PM DNR [id number]  [name], MD Inpatient    Questions for Most Recent Historical Code Status (Order [id number])    Question Answer Comment   In the event of  cardiac or respiratory ARREST Do not call a "code blue"    In the event of cardiac or respiratory ARREST Do not perform Intubation, CPR, defibrillation or ACLS    In the event of cardiac or respiratory ARREST Use medication by any route, position, wound care, and other measures to relive pain and suffering. May use oxygen, suction and manual treatment of airway obstruction as needed for comfort.     Advance Directive Documentation        Most Recent Value   Type of Advance Directive  Healthcare Power of Attorney   Pre-existing out of facility DNR order (yellow form or pink MOST form)     "MOST" Form in Place?        TOTAL TIME TAKING CARE OF THIS PATIENT ON DAY OF DISCHARGE: more than 30 minutes.   Hillary Bow R M.D on 12/08/2015 at 8:52 AM  Between 7am to 6pm - Pager - [phone]  After 6pm go to [email or web address] - password [email or web address] [facility]  Office  [phone]  CC: Primary care physician; [name], MD  Note: This dictation was prepared with Dragon dictation along with smaller phrase technology. Any transcriptional errors that result from this process are unintentional.

## 2015-12-12 DIAGNOSIS — R2689 Other abnormalities of gait and mobility: Secondary | ICD-10-CM | POA: Diagnosis not present

## 2015-12-12 DIAGNOSIS — M109 Gout, unspecified: Secondary | ICD-10-CM | POA: Diagnosis not present

## 2015-12-12 DIAGNOSIS — M1990 Unspecified osteoarthritis, unspecified site: Secondary | ICD-10-CM | POA: Diagnosis not present

## 2015-12-12 DIAGNOSIS — R531 Weakness: Secondary | ICD-10-CM | POA: Diagnosis not present

## 2015-12-12 DIAGNOSIS — I10 Essential (primary) hypertension: Secondary | ICD-10-CM | POA: Diagnosis not present

## 2015-12-12 DIAGNOSIS — I69331 Monoplegia of upper limb following cerebral infarction affecting right dominant side: Secondary | ICD-10-CM | POA: Diagnosis not present

## 2015-12-13 DIAGNOSIS — R531 Weakness: Secondary | ICD-10-CM | POA: Diagnosis not present

## 2015-12-13 DIAGNOSIS — I10 Essential (primary) hypertension: Secondary | ICD-10-CM | POA: Diagnosis not present

## 2015-12-13 DIAGNOSIS — M1990 Unspecified osteoarthritis, unspecified site: Secondary | ICD-10-CM | POA: Diagnosis not present

## 2015-12-13 DIAGNOSIS — I69331 Monoplegia of upper limb following cerebral infarction affecting right dominant side: Secondary | ICD-10-CM | POA: Diagnosis not present

## 2015-12-13 DIAGNOSIS — R2689 Other abnormalities of gait and mobility: Secondary | ICD-10-CM | POA: Diagnosis not present

## 2015-12-13 DIAGNOSIS — M109 Gout, unspecified: Secondary | ICD-10-CM | POA: Diagnosis not present

## 2015-12-15 DIAGNOSIS — M1990 Unspecified osteoarthritis, unspecified site: Secondary | ICD-10-CM | POA: Diagnosis not present

## 2015-12-15 DIAGNOSIS — I69331 Monoplegia of upper limb following cerebral infarction affecting right dominant side: Secondary | ICD-10-CM | POA: Diagnosis not present

## 2015-12-15 DIAGNOSIS — R2689 Other abnormalities of gait and mobility: Secondary | ICD-10-CM | POA: Diagnosis not present

## 2015-12-15 DIAGNOSIS — R531 Weakness: Secondary | ICD-10-CM | POA: Diagnosis not present

## 2015-12-15 DIAGNOSIS — M109 Gout, unspecified: Secondary | ICD-10-CM | POA: Diagnosis not present

## 2015-12-15 DIAGNOSIS — I10 Essential (primary) hypertension: Secondary | ICD-10-CM | POA: Diagnosis not present

## 2015-12-19 DIAGNOSIS — M1990 Unspecified osteoarthritis, unspecified site: Secondary | ICD-10-CM | POA: Diagnosis not present

## 2015-12-19 DIAGNOSIS — I69331 Monoplegia of upper limb following cerebral infarction affecting right dominant side: Secondary | ICD-10-CM | POA: Diagnosis not present

## 2015-12-19 DIAGNOSIS — R531 Weakness: Secondary | ICD-10-CM | POA: Diagnosis not present

## 2015-12-19 DIAGNOSIS — I10 Essential (primary) hypertension: Secondary | ICD-10-CM | POA: Diagnosis not present

## 2015-12-19 DIAGNOSIS — M109 Gout, unspecified: Secondary | ICD-10-CM | POA: Diagnosis not present

## 2015-12-19 DIAGNOSIS — R2689 Other abnormalities of gait and mobility: Secondary | ICD-10-CM | POA: Diagnosis not present

## 2015-12-22 DIAGNOSIS — I69331 Monoplegia of upper limb following cerebral infarction affecting right dominant side: Secondary | ICD-10-CM | POA: Diagnosis not present

## 2015-12-22 DIAGNOSIS — M1990 Unspecified osteoarthritis, unspecified site: Secondary | ICD-10-CM | POA: Diagnosis not present

## 2015-12-22 DIAGNOSIS — M109 Gout, unspecified: Secondary | ICD-10-CM | POA: Diagnosis not present

## 2015-12-22 DIAGNOSIS — R531 Weakness: Secondary | ICD-10-CM | POA: Diagnosis not present

## 2015-12-22 DIAGNOSIS — R2689 Other abnormalities of gait and mobility: Secondary | ICD-10-CM | POA: Diagnosis not present

## 2015-12-22 DIAGNOSIS — I10 Essential (primary) hypertension: Secondary | ICD-10-CM | POA: Diagnosis not present

## 2015-12-27 DIAGNOSIS — M109 Gout, unspecified: Secondary | ICD-10-CM | POA: Diagnosis not present

## 2015-12-27 DIAGNOSIS — M1990 Unspecified osteoarthritis, unspecified site: Secondary | ICD-10-CM | POA: Diagnosis not present

## 2015-12-27 DIAGNOSIS — I10 Essential (primary) hypertension: Secondary | ICD-10-CM | POA: Diagnosis not present

## 2015-12-27 DIAGNOSIS — R531 Weakness: Secondary | ICD-10-CM | POA: Diagnosis not present

## 2015-12-27 DIAGNOSIS — I69331 Monoplegia of upper limb following cerebral infarction affecting right dominant side: Secondary | ICD-10-CM | POA: Diagnosis not present

## 2015-12-27 DIAGNOSIS — R2689 Other abnormalities of gait and mobility: Secondary | ICD-10-CM | POA: Diagnosis not present

## 2015-12-29 DIAGNOSIS — R2689 Other abnormalities of gait and mobility: Secondary | ICD-10-CM | POA: Diagnosis not present

## 2015-12-29 DIAGNOSIS — R531 Weakness: Secondary | ICD-10-CM | POA: Diagnosis not present

## 2015-12-29 DIAGNOSIS — I10 Essential (primary) hypertension: Secondary | ICD-10-CM | POA: Diagnosis not present

## 2015-12-29 DIAGNOSIS — M1990 Unspecified osteoarthritis, unspecified site: Secondary | ICD-10-CM | POA: Diagnosis not present

## 2015-12-29 DIAGNOSIS — I69331 Monoplegia of upper limb following cerebral infarction affecting right dominant side: Secondary | ICD-10-CM | POA: Diagnosis not present

## 2015-12-29 DIAGNOSIS — M109 Gout, unspecified: Secondary | ICD-10-CM | POA: Diagnosis not present

## 2015-12-30 DIAGNOSIS — I10 Essential (primary) hypertension: Secondary | ICD-10-CM | POA: Diagnosis not present

## 2015-12-30 DIAGNOSIS — I6522 Occlusion and stenosis of left carotid artery: Secondary | ICD-10-CM | POA: Diagnosis not present

## 2015-12-30 DIAGNOSIS — I639 Cerebral infarction, unspecified: Secondary | ICD-10-CM | POA: Diagnosis not present

## 2016-01-03 ENCOUNTER — Other Ambulatory Visit: Payer: Self-pay | Admitting: Vascular Surgery

## 2016-01-03 DIAGNOSIS — I6522 Occlusion and stenosis of left carotid artery: Secondary | ICD-10-CM

## 2016-01-09 ENCOUNTER — Ambulatory Visit
Admission: RE | Admit: 2016-01-09 | Discharge: 2016-01-09 | Disposition: A | Discharge status: Home / Self Care | Payer: Medicare Other | Source: Ambulatory Visit | Attending: Vascular Surgery | Admitting: Vascular Surgery

## 2016-01-09 DIAGNOSIS — I6523 Occlusion and stenosis of bilateral carotid arteries: Secondary | ICD-10-CM | POA: Insufficient documentation

## 2016-01-09 DIAGNOSIS — I6522 Occlusion and stenosis of left carotid artery: Secondary | ICD-10-CM | POA: Diagnosis present

## 2016-01-09 DIAGNOSIS — I63231 Cerebral infarction due to unspecified occlusion or stenosis of right carotid arteries: Secondary | ICD-10-CM | POA: Diagnosis not present

## 2016-01-09 DIAGNOSIS — I700 Atherosclerosis of aorta: Secondary | ICD-10-CM | POA: Diagnosis not present

## 2016-01-09 DIAGNOSIS — I63232 Cerebral infarction due to unspecified occlusion or stenosis of left carotid arteries: Secondary | ICD-10-CM | POA: Diagnosis not present

## 2016-01-09 LAB — POCT I-STAT CREATININE: CREATININE: 1.1 mg/dL — AB (ref 0.44–1.00)

## 2016-01-09 MED ORDER — IOPAMIDOL (ISOVUE-370) INJECTION 76%
75.0000 mL | Freq: Once | INTRAVENOUS | Status: AC | PRN
  Administered 2016-01-09: 75 mL via INTRAVENOUS

## 2016-01-10 DIAGNOSIS — I6522 Occlusion and stenosis of left carotid artery: Secondary | ICD-10-CM | POA: Diagnosis not present

## 2016-01-10 DIAGNOSIS — I639 Cerebral infarction, unspecified: Secondary | ICD-10-CM | POA: Diagnosis not present

## 2016-01-10 DIAGNOSIS — I10 Essential (primary) hypertension: Secondary | ICD-10-CM | POA: Diagnosis not present

## 2016-01-10 DIAGNOSIS — I63232 Cerebral infarction due to unspecified occlusion or stenosis of left carotid arteries: Secondary | ICD-10-CM | POA: Diagnosis not present

## 2016-02-14 DIAGNOSIS — Z23 Encounter for immunization: Secondary | ICD-10-CM | POA: Diagnosis not present

## 2016-03-29 DIAGNOSIS — I82402 Acute embolism and thrombosis of unspecified deep veins of left lower extremity: Secondary | ICD-10-CM | POA: Diagnosis not present

## 2016-03-29 DIAGNOSIS — M7122 Synovial cyst of popliteal space [Baker], left knee: Secondary | ICD-10-CM | POA: Diagnosis not present

## 2016-03-29 DIAGNOSIS — M79669 Pain in unspecified lower leg: Secondary | ICD-10-CM | POA: Diagnosis not present

## 2016-04-05 DIAGNOSIS — Z08 Encounter for follow-up examination after completed treatment for malignant neoplasm: Secondary | ICD-10-CM | POA: Diagnosis not present

## 2016-04-05 DIAGNOSIS — Z85828 Personal history of other malignant neoplasm of skin: Secondary | ICD-10-CM | POA: Diagnosis not present

## 2016-04-05 DIAGNOSIS — L821 Other seborrheic keratosis: Secondary | ICD-10-CM | POA: Diagnosis not present

## 2016-04-17 ENCOUNTER — Ambulatory Visit (INDEPENDENT_AMBULATORY_CARE_PROVIDER_SITE_OTHER): Payer: Medicare Other | Admitting: Vascular Surgery

## 2016-04-17 ENCOUNTER — Encounter (INDEPENDENT_AMBULATORY_CARE_PROVIDER_SITE_OTHER): Payer: Self-pay | Admitting: Vascular Surgery

## 2016-04-17 VITALS — BP 164/88 | HR 90 | Resp 18 | Ht 66.0 in | Wt 133.0 lb

## 2016-04-17 DIAGNOSIS — I10 Essential (primary) hypertension: Secondary | ICD-10-CM | POA: Diagnosis not present

## 2016-04-17 DIAGNOSIS — I6523 Occlusion and stenosis of bilateral carotid arteries: Secondary | ICD-10-CM | POA: Diagnosis not present

## 2016-04-17 DIAGNOSIS — I824Z2 Acute embolism and thrombosis of unspecified deep veins of left distal lower extremity: Secondary | ICD-10-CM | POA: Diagnosis not present

## 2016-04-17 DIAGNOSIS — Z86718 Personal history of other venous thrombosis and embolism: Secondary | ICD-10-CM | POA: Insufficient documentation

## 2016-04-17 DIAGNOSIS — I6320 Cerebral infarction due to unspecified occlusion or stenosis of unspecified precerebral arteries: Secondary | ICD-10-CM | POA: Diagnosis not present

## 2016-04-17 NOTE — Assessment & Plan Note (Signed)
The patient had a embolic event several months ago with high-grade left carotid artery stenosis, but opted for medical management. Given her advanced age think that is a very reasonable option. She remains on aspirin, Plavix, and a statin agent.

## 2016-04-17 NOTE — Progress Notes (Signed)
MRN : [id number]  Ashley [name] is a 82 y.o. (03-18-1919) female who presents with chief complaint of  Chief Complaint  Patient presents with  . Re-evaluation    Left leg DVt  .  History of Present Illness: Patient returns today On referral from her primary care physician for a new vascular problem. She is previously been seen for carotid stenosis and opted for medical management with dual antiplatelet therapy and statin. Given her advanced age I think this is very reasonable. She apparently was complaining of right leg and groin pain. An ultrasound was done which suggested a left lower extremity DVT. I do not have the actual ultrasound and noted the exact veins involvement in the noted since mid to distal which makes me feel like it is probably a calf vein DVT and maybe a popliteal vein DVT. She remains on aspirin and Plavix without significant symptoms.  Current Outpatient Prescriptions  Medication Sig Dispense Refill  . acetaminophen (TYLENOL) 500 MG tablet Take 1 tablet (500 mg total) by mouth every 6 (six) hours as needed for moderate pain. 30 tablet 0  . allopurinol (ZYLOPRIM) 100 MG tablet Take 100 mg by mouth daily as needed (for gout flare up.).     Marland Kitchen aspirin EC 81 MG EC tablet Take 1 tablet (81 mg total) by mouth daily.    Marland Kitchen atorvastatin (LIPITOR) 20 MG tablet Take 1 tablet (20 mg total) by mouth daily at 6 PM. 30 tablet 0  . levothyroxine (SYNTHROID, LEVOTHROID) 150 MCG tablet Take 150 mcg by mouth daily before breakfast.    . losartan (COZAAR) 50 MG tablet Take 50 mg by mouth daily.     No current facility-administered medications for this visit.     Past Medical History:  Diagnosis Date  . Cancer ([place]) skin  . Diverticulitis   . Gout   . Hypertension   . Hypothyroid   . Osteoarthritis     Past Surgical History:  Procedure Laterality Date  . CATARACT EXTRACTION    . Finger crystal removal     right 3rd  . toe crystal removal     right 1st toe    Social  History Social History  Substance Use Topics  . Smoking status: Never Smoker  . Smokeless tobacco: Not on file  . Alcohol use No     Family History No bleeding or clotting disorders  No Known Allergies   REVIEW OF SYSTEMS (Negative unless checked)  Constitutional: [] Weight loss  [] Fever  [] Chills Cardiac: [] Chest pain   [] Chest pressure   [] Palpitations   [] Shortness of breath when laying flat   [] Shortness of breath at rest   [] Shortness of breath with exertion. Vascular:  [] Pain in legs with walking   [] Pain in legs at rest   [] Pain in legs when laying flat   [] Claudication   [] Pain in feet when walking  [] Pain in feet at rest  [] Pain in feet when laying flat   [x] History of DVT   [] Phlebitis   [] Swelling in legs   [] Varicose veins   [] Non-healing ulcers Pulmonary:   [] Uses home oxygen   [] Productive cough   [] Hemoptysis   [] Wheeze  [] COPD   [] Asthma Neurologic:  [] Dizziness  [] Blackouts   [] Seizures   [x] History of stroke   [] History of TIA  [] Aphasia   [] Temporary blindness   [] Dysphagia   [] Weakness or numbness in arms   [] Weakness or numbness in legs Musculoskeletal:  [] Arthritis   [] Joint swelling   [  x]Joint pain   [] Low back pain Hematologic:  [] Easy bruising  [] Easy bleeding   [] Hypercoagulable state   [] Anemic   Gastrointestinal:  [] Blood in stool   [] Vomiting blood  [] Gastroesophageal reflux/heartburn   [] Abdominal pain Genitourinary:  [] Chronic kidney disease   [] Difficult urination  [] Frequent urination  [] Burning with urination   [] Hematuria Skin:  [] Rashes   [] Ulcers   [] Wounds Psychological:  [] History of anxiety   []  History of major depression.  Physical Examination  BP (!) 164/88 (BP Location: Right Arm)   Pulse 90   Resp 18   Ht 5\' 6"  (1.676 m)   Wt 133 lb (60.3 kg)   BMI 21.47 kg/m  Gen:  WD/WN, NAD. Appears younger than stated age Head: [place]/AT, No temporalis wasting. Ear/Nose/Throat: Hearing grossly intact, nares w/o erythema or drainage, trachea  midline Eyes: Conjunctiva clear. Sclera non-icteric Neck: Supple.  No JVD.  Pulmonary:  Good air movement, no use of accessory muscles.  Cardiac: RRR, normal S1, S2 Vascular:  Vessel Right Left  Radial Palpable Palpable  Ulnar Palpable Palpable  Brachial Palpable Palpable  Carotid Palpable, with bruit Palpable, with bruit  Aorta Not palpable N/A  Femoral Palpable Palpable  Popliteal Palpable Palpable  PT Palpable Palpable  DP Palpable Palpable   Gastrointestinal: soft, non-tender/non-distended. No guarding/reflex.  Musculoskeletal: M/S 5/5 throughout.  No deformity or atrophy. Trace to 1+ bilateral lower extremity edema. Walk with a walker arthritic changes are present..  Neurologic: Sensation grossly intact in extremities.  Symmetrical.  Speech is fluent.  Psychiatric: Judgment intact, Mood & affect appropriate for pt's clinical situation. Dermatologic: No rashes or ulcers noted.  No cellulitis or open wounds. Lymph : No Cervical, Axillary, or Inguinal lymphadenopathy.      Labs No results found for this or any previous visit (from the past 2160 hour(s)).  Radiology No results found.    Assessment/Plan  HTN (hypertension) blood pressure control important in reducing the progression of atherosclerotic disease. On appropriate oral medications.   Carotid stenosis The patient had a embolic event several months ago with high-grade left carotid artery stenosis, but opted for medical management. Given her advanced age think that is a very reasonable option. She remains on aspirin, Plavix, and a statin agent.  DVT (deep venous thrombosis) ([place]) The patient has a left lower extremity DVT that was found recently on ultrasound. I do not have the actual ultrasound for you, but in the note it says a mid to distal DVT. I suspect this means a calf vein DVT and may be a popliteal vein DVT on the left. It is also not clear if this is acute or chronic because her symptoms were on the  right leg. She is wearing compression stockings with minimal swelling in either lower extremity. She is not complaining of pain. Given her advanced age and the unclear nature of whether or not this is acute or chronic, I think aspirin and Plavix is appropriate therapy for her at this time. I would not recommend putting a 82 year old on full anticoagulation for a possible chronic DVT. I do think rechecking the ultrasound in 2-3 months is reasonable to ensure there has not been progression.    Leotis Pain, MD  04/17/2016 2:05 PM    This note was created with Dragon medical transcription system.  Any errors from dictation are purely unintentional

## 2016-04-17 NOTE — Assessment & Plan Note (Signed)
blood pressure control important in reducing the progression of atherosclerotic disease. On appropriate oral medications.  

## 2016-04-17 NOTE — Assessment & Plan Note (Signed)
The patient has a left lower extremity DVT that was found recently on ultrasound. I do not have the actual ultrasound for you, but in the note it says a mid to distal DVT. I suspect this means a calf vein DVT and may be a popliteal vein DVT on the left. It is also not clear if this is acute or chronic because her symptoms were on the right leg. She is wearing compression stockings with minimal swelling in either lower extremity. She is not complaining of pain. Given her advanced age and the unclear nature of whether or not this is acute or chronic, I think aspirin and Plavix is appropriate therapy for her at this time. I would not recommend putting a 82 year old on full anticoagulation for a possible chronic DVT. I do think rechecking the ultrasound in 2-3 months is reasonable to ensure there has not been progression.

## 2016-05-10 ENCOUNTER — Other Ambulatory Visit (INDEPENDENT_AMBULATORY_CARE_PROVIDER_SITE_OTHER): Payer: Self-pay | Admitting: Vascular Surgery

## 2016-06-20 ENCOUNTER — Ambulatory Visit (INDEPENDENT_AMBULATORY_CARE_PROVIDER_SITE_OTHER): Payer: Medicare Other

## 2016-06-20 ENCOUNTER — Ambulatory Visit (INDEPENDENT_AMBULATORY_CARE_PROVIDER_SITE_OTHER): Payer: Medicare Other | Admitting: Vascular Surgery

## 2016-06-20 ENCOUNTER — Encounter (INDEPENDENT_AMBULATORY_CARE_PROVIDER_SITE_OTHER): Payer: Self-pay | Admitting: Vascular Surgery

## 2016-06-20 VITALS — BP 138/82 | HR 74 | Resp 14 | Wt 127.0 lb

## 2016-06-20 DIAGNOSIS — I824Z2 Acute embolism and thrombosis of unspecified deep veins of left distal lower extremity: Secondary | ICD-10-CM

## 2016-06-20 DIAGNOSIS — E785 Hyperlipidemia, unspecified: Secondary | ICD-10-CM

## 2016-06-20 DIAGNOSIS — I10 Essential (primary) hypertension: Secondary | ICD-10-CM

## 2016-06-20 HISTORY — DX: Hyperlipidemia, unspecified: E78.5

## 2016-06-20 NOTE — Progress Notes (Signed)
Subjective:    Patient ID: Ashley [name], female    DOB: 03/14/1919, 83 y.o.   MRN: [id number] Chief Complaint  Patient presents with  . Follow-up   Patient presents to review vascular studies. She was last seen on 04/17/16 with a left lower extremity DVT. She presents today accompanied with a female family member. She is without complaint. Her lower extremity swelling has resolved. She denies any fever, nausea or vomiting. The patient underwent a bilateral venous duplex which was notable for no DVT's or SVT's.    Review of Systems  Constitutional: Negative.   HENT: Negative.   Eyes: Negative.   Respiratory: Negative.   Cardiovascular: Negative.   Gastrointestinal: Negative.   Endocrine: Negative.   Genitourinary: Negative.   Musculoskeletal: Negative.   Skin: Negative.   Allergic/Immunologic: Negative.   Neurological: Negative.   Hematological: Negative.   Psychiatric/Behavioral: Negative.       Objective:   Physical Exam  Constitutional: She is oriented to person, place, and time. She appears well-developed and well-nourished.  Uses a walker.   HENT:  Head: Normocephalic and atraumatic.  Right Ear: External ear normal.  Left Ear: External ear normal.  Eyes: Conjunctivae and EOM are normal. Pupils are equal, round, and reactive to light.  Neck: Normal range of motion.  Cardiovascular: Normal rate, regular rhythm, normal heart sounds and intact distal pulses.   Pulses:      Radial pulses are 2+ on the right side, and 2+ on the left side.       Dorsalis pedis pulses are 2+ on the right side, and 2+ on the left side.       Posterior tibial pulses are 2+ on the right side, and 2+ on the left side.  Pulmonary/Chest: Effort normal and breath sounds normal.  Abdominal: Soft. Bowel sounds are normal.  Musculoskeletal: Normal range of motion. She exhibits no edema.  Neurological: She is alert and oriented to person, place, and time.  Skin: Skin is warm and dry.  Psychiatric:  She has a normal mood and affect. Her behavior is normal. Judgment and thought content normal.   BP 138/82   Pulse 74   Resp 14   Wt 127 lb (57.6 kg)   BMI 20.50 kg/m   Past Medical History:  Diagnosis Date  . Cancer ([place]) skin  . Diverticulitis   . Gout   . Hypertension   . Hypothyroid   . Osteoarthritis    Social History   Social History  . Marital status: Widowed    Spouse name: N/A  . Number of children: N/A  . Years of education: N/A   Occupational History  . Not on file.   Social History Main Topics  . Smoking status: Never Smoker  . Smokeless tobacco: Never Used  . Alcohol use No  . Drug use: No  . Sexual activity: Not on file   Other Topics Concern  . Not on file   Social History Narrative  . No narrative on file   Past Surgical History:  Procedure Laterality Date  . CATARACT EXTRACTION    . Finger crystal removal     right 3rd  . toe crystal removal     right 1st toe   History reviewed. No pertinent family history.  No Known Allergies     Assessment & Plan:  Patient presents to review vascular studies. She was last seen on 04/17/16 with a left lower extremity DVT. She presents today accompanied with a female  family member. She is without complaint. Her lower extremity swelling has resolved. She denies any fever, nausea or vomiting. The patient underwent a bilateral venous duplex which was notable for no DVT's or SVT's.  1. Deep vein thrombosis (DVT) of distal vein of left lower extremity, unspecified chronicity ([place]) - Resolvement Patient asymptomatic. No DVT or SVT seen on venous duplex today. Patient to continue normal routine of ASA and Plavix. Follow up PRN  2. Hyperlipidemia, unspecified hyperlipidemia type - Stable On ASA and statin for medical optimization. Encouraged good control as its slows the progression of atherosclerotic disease  3. Essential hypertension - Stable Encouraged good control as its slows the progression of  atherosclerotic disease  Current Outpatient Prescriptions on File Prior to Visit  Medication Sig Dispense Refill  . acetaminophen (TYLENOL) 500 MG tablet Take 1 tablet (500 mg total) by mouth every 6 (six) hours as needed for moderate pain. 30 tablet 0  . aspirin EC 81 MG EC tablet Take 1 tablet (81 mg total) by mouth daily.    Marland Kitchen atorvastatin (LIPITOR) 20 MG tablet Take 1 tablet (20 mg total) by mouth daily at 6 PM. 30 tablet 0  . clopidogrel (PLAVIX) 75 MG tablet TAKE ONE TABLET BY MOUTH ONCE DAILY 30 tablet 3  . levothyroxine (SYNTHROID, LEVOTHROID) 150 MCG tablet Take 150 mcg by mouth daily before breakfast.    . losartan (COZAAR) 50 MG tablet Take 50 mg by mouth daily.    Marland Kitchen allopurinol (ZYLOPRIM) 100 MG tablet Take 100 mg by mouth daily as needed (for gout flare up.).      No current facility-administered medications on file prior to visit.     There are no Patient Instructions on file for this visit. No Follow-up on file.   Riaz Onorato A Kryssa Risenhoover, PA-C

## 2016-08-28 DIAGNOSIS — I10 Essential (primary) hypertension: Secondary | ICD-10-CM | POA: Diagnosis not present

## 2016-08-28 DIAGNOSIS — E785 Hyperlipidemia, unspecified: Secondary | ICD-10-CM | POA: Diagnosis not present

## 2016-08-28 DIAGNOSIS — R531 Weakness: Secondary | ICD-10-CM | POA: Diagnosis not present

## 2016-08-28 DIAGNOSIS — R5383 Other fatigue: Secondary | ICD-10-CM | POA: Diagnosis not present

## 2016-08-28 DIAGNOSIS — N390 Urinary tract infection, site not specified: Secondary | ICD-10-CM | POA: Diagnosis not present

## 2016-08-28 DIAGNOSIS — E559 Vitamin D deficiency, unspecified: Secondary | ICD-10-CM | POA: Diagnosis not present

## 2016-08-28 DIAGNOSIS — E7800 Pure hypercholesterolemia, unspecified: Secondary | ICD-10-CM | POA: Diagnosis not present

## 2016-08-28 DIAGNOSIS — R110 Nausea: Secondary | ICD-10-CM | POA: Diagnosis not present

## 2016-08-28 DIAGNOSIS — E039 Hypothyroidism, unspecified: Secondary | ICD-10-CM | POA: Diagnosis not present

## 2016-09-04 ENCOUNTER — Encounter: Payer: Self-pay | Admitting: Family Medicine

## 2016-09-04 ENCOUNTER — Ambulatory Visit (INDEPENDENT_AMBULATORY_CARE_PROVIDER_SITE_OTHER): Payer: Medicare Other | Admitting: Family Medicine

## 2016-09-04 VITALS — BP 136/74 | HR 94 | Temp 97.6°F | Ht 63.0 in | Wt 132.5 lb

## 2016-09-04 DIAGNOSIS — I10 Essential (primary) hypertension: Secondary | ICD-10-CM | POA: Diagnosis not present

## 2016-09-04 DIAGNOSIS — E785 Hyperlipidemia, unspecified: Secondary | ICD-10-CM

## 2016-09-04 DIAGNOSIS — E039 Hypothyroidism, unspecified: Secondary | ICD-10-CM | POA: Diagnosis not present

## 2016-09-04 DIAGNOSIS — Z86718 Personal history of other venous thrombosis and embolism: Secondary | ICD-10-CM | POA: Diagnosis not present

## 2016-09-04 DIAGNOSIS — M109 Gout, unspecified: Secondary | ICD-10-CM

## 2016-09-04 DIAGNOSIS — E119 Type 2 diabetes mellitus without complications: Secondary | ICD-10-CM

## 2016-09-04 DIAGNOSIS — E1159 Type 2 diabetes mellitus with other circulatory complications: Secondary | ICD-10-CM

## 2016-09-04 DIAGNOSIS — E1122 Type 2 diabetes mellitus with diabetic chronic kidney disease: Secondary | ICD-10-CM | POA: Insufficient documentation

## 2016-09-04 DIAGNOSIS — N183 Chronic kidney disease, stage 3 unspecified: Secondary | ICD-10-CM | POA: Insufficient documentation

## 2016-09-04 DIAGNOSIS — I700 Atherosclerosis of aorta: Secondary | ICD-10-CM

## 2016-09-04 DIAGNOSIS — I2510 Atherosclerotic heart disease of native coronary artery without angina pectoris: Secondary | ICD-10-CM

## 2016-09-04 HISTORY — DX: Atherosclerotic heart disease of native coronary artery without angina pectoris: I25.10

## 2016-09-04 HISTORY — DX: Type 2 diabetes mellitus without complications: E11.9

## 2016-09-04 HISTORY — DX: Atherosclerosis of aorta: I70.0

## 2016-09-04 LAB — LDL CHOLESTEROL, DIRECT: LDL DIRECT: 58 mg/dL

## 2016-09-04 LAB — LIPID PANEL
CHOL/HDL RATIO: 3
Cholesterol: 141 mg/dL (ref 0–200)
HDL: 50.4 mg/dL (ref >39.00)
NonHDL: 90.89
Triglycerides: 208 mg/dL — ABNORMAL HIGH (ref 0.0–149.0)
VLDL: 41.6 mg/dL — ABNORMAL HIGH (ref 0.0–40.0)

## 2016-09-04 LAB — COMPREHENSIVE METABOLIC PANEL
ALT: 10 U/L (ref 0–35)
AST: 18 U/L (ref 0–37)
Albumin: 3.6 g/dL (ref 3.5–5.2)
Alkaline Phosphatase: 95 U/L (ref 39–117)
BILIRUBIN TOTAL: 0.6 mg/dL (ref 0.2–1.2)
BUN: 28 mg/dL — AB (ref 6–23)
CO2: 25 meq/L (ref 19–32)
CREATININE: 1.08 mg/dL (ref 0.40–1.20)
Calcium: 9.3 mg/dL (ref 8.4–10.5)
Chloride: 105 mEq/L (ref 96–112)
GFR: 50.06 mL/min — AB (ref >60.00)
GLUCOSE: 154 mg/dL — AB (ref 70–99)
Potassium: 4 mEq/L (ref 3.5–5.1)
SODIUM: 140 meq/L (ref 135–145)
Total Protein: 6.9 g/dL (ref 6.0–8.3)

## 2016-09-04 LAB — CBC
HCT: 38 % (ref 36.0–46.0)
Hemoglobin: 12 g/dL (ref 12.0–15.0)
MCHC: 31.6 g/dL (ref 30.0–36.0)
MCV: 81.6 fl (ref 78.0–100.0)
Platelets: 221 10*3/uL (ref 150.0–400.0)
RBC: 4.66 Mil/uL (ref 3.87–5.11)
RDW: 19 % — AB (ref 11.5–15.5)
WBC: 6.4 10*3/uL (ref 4.0–10.5)

## 2016-09-04 LAB — HEMOGLOBIN A1C: HEMOGLOBIN A1C: 6.7 % — AB (ref 4.6–6.5)

## 2016-09-04 LAB — URIC ACID: URIC ACID, SERUM: 8.1 mg/dL — AB (ref 2.4–7.0)

## 2016-09-04 LAB — TSH: TSH: 0.08 u[IU]/mL — ABNORMAL LOW (ref 0.35–4.50)

## 2016-09-04 MED ORDER — VITAMIN D3 10 MCG (400 UNIT) PO CAPS: ORAL_CAPSULE | ORAL | Status: DC

## 2016-09-04 MED ORDER — LEVOTHYROXINE SODIUM 100 MCG PO TABS: 100.0000 ug | ORAL_TABLET | Freq: Every day | ORAL | 0 refills | Status: DC

## 2016-09-04 NOTE — Assessment & Plan Note (Signed)
TSH suppressed. Decreased Synthroid to 100 mcg.

## 2016-09-04 NOTE — Patient Instructions (Signed)
Continue her current medications.  Be sure that she takes the synthroid on an empty stomach (no calcium/milk, other medications 1 hour before or after).  We will call with her lab results.  Follow up in 3 months.  Take care  Dr. Lacinda Axon

## 2016-09-04 NOTE — Assessment & Plan Note (Signed)
Advised aspirin, plavix, statin.

## 2016-09-04 NOTE — Progress Notes (Signed)
Pre visit review using our clinic review tool, if applicable. No additional management support is needed unless otherwise documented below in the visit note. 

## 2016-09-04 NOTE — Assessment & Plan Note (Signed)
A1C 6.7 today. At goal. Given advanced age will continue to monitor and will not add medication.

## 2016-09-04 NOTE — Assessment & Plan Note (Signed)
Stable. Continue Losartan. 

## 2016-09-04 NOTE — Assessment & Plan Note (Signed)
At goal on Lipitor. Continue. 

## 2016-09-04 NOTE — Progress Notes (Signed)
Subjective:  Patient ID: Ashley [name], female    DOB: 02/25/1919  Age: 83 y.o. MRN: [id number]  CC: Establish care  HPI Ashley [name] is a 83 y.o. female with an extensive PMH presents to establish care. Issues are below.  HTN  Stable on Losartan.  Needs labs (has not had recent metabolic panel).  Atherosclerosis  Currently asymptomatic.  On Lipitor, Plavix.  No taking aspirin regularly.  HLD  Currently on Lipitor.  Needs labs today.  DM-2  Patient and family unaware of diagnosis (prior A1C of 6.7 noted in EMR).  No meds at this time.  Unsure of control.   Needs A1C today.  Hypothyroidism  No recent TSH.  She is currently on 150 mcg daily (high dose given age, weight).  Needs labs today.  Gout  Was previously on Allopurinol.   No recent uric acid level.  No recent flare.  PMH, Surgical Hx, Family Hx, Social History reviewed and updated as below.  Past Medical History:  Diagnosis Date  . Aortic atherosclerosis ([place]) 09/04/2016  . Carotid stenosis 12/03/2015   Advanced heavily calcified atherosclerotic disease at both carotid bifurcations. Stenoses measured at 70% affecting the right proximal ICA and 80% affecting the left proximal ICA.  Marland Kitchen Coronary atherosclerosis 09/04/2016  . Diverticulitis   . DM (diabetes mellitus), type 2 ([place]) 09/04/2016  . Gout   . Hyperlipidemia 06/20/2016  . Hypertension   . Hypothyroid   . Osteoarthritis   . Stroke [facility])    Past Surgical History:  Procedure Laterality Date  . CATARACT EXTRACTION    . Finger crystal removal     right 3rd  . toe crystal removal     right 1st toe   Family History  Problem Relation Age of Onset  . Hypertension Sister   . Arthritis Sister   . Heart disease Sister   . Alcohol abuse Brother   . Stroke Brother   . Hypertension Brother   . Alcohol abuse Brother   . Stroke Brother   . Hypertension Brother   . Hypertension Brother   . Hypertension Sister    Social History    Substance Use Topics  . Smoking status: Never Smoker  . Smokeless tobacco: Never Used  . Alcohol use No    Review of Systems  HENT: Positive for hearing loss.   Gastrointestinal: Positive for nausea.  Neurological: Positive for headaches.  Psychiatric/Behavioral:       Stress.  All other systems reviewed and are negative.   Objective:   Today's Vitals: BP 136/74   Pulse 94   Temp 97.6 F (36.4 C) (Oral)   Ht 5\' 3"  (1.6 m)   Wt 132 lb 8 oz (60.1 kg)   SpO2 94%   BMI 23.47 kg/m   Physical Exam  Constitutional: She appears well-developed. No distress.  HENT:  Head: Normocephalic and atraumatic.  Mouth/Throat: Oropharynx is clear and moist.  Eyes: Conjunctivae are normal.  Neck: Neck supple.  Cardiovascular: Normal rate and regular rhythm.   Murmur heard. Pulmonary/Chest: Effort normal and breath sounds normal. She has no wheezes. She has no rales.  Abdominal: Soft. She exhibits no distension. There is no tenderness.  Musculoskeletal: Normal range of motion.  Neurological: She is alert.  Skin: No rash noted.  Psychiatric: She has a normal mood and affect.  Vitals reviewed.  Assessment & Plan:   Problem List Items Addressed This Visit    Hypothyroidism    TSH suppressed. Decreased Synthroid to 100  mcg.      Relevant Medications   levothyroxine (SYNTHROID, LEVOTHROID) 100 MCG tablet   Other Relevant Orders   TSH (Completed)   Hyperlipidemia    At goal on Lipitor. Continue.      Relevant Orders   Lipid panel (Completed)   History of DVT (deep vein thrombosis)   Relevant Orders   CBC (Completed)   Gout    Uric acid 8.1. Need to discuss restarting allopurinol and prophylactic colchicine.      Relevant Orders   Uric acid (Completed)   Essential hypertension - Primary    Stable. Continue Losartan.      Relevant Orders   Comprehensive metabolic panel (Completed)   DM (diabetes mellitus), type 2 ([facility])    A1C 6.7 today. At goal. Given advanced age  will continue to monitor and will not add medication.      Relevant Orders   Hemoglobin A1c (Completed)   Aortic atherosclerosis ([facility])    Advised aspirin, plavix, statin.        Meds ordered this encounter  Medications  . Cholecalciferol (VITAMIN D3) 400 units CAPS    Sig: 2 caps daily.    Dispense:  150 capsule  . levothyroxine (SYNTHROID, LEVOTHROID) 100 MCG tablet    Sig: Take 1 tablet (100 mcg total) by mouth daily.    Dispense:  45 tablet    Refill:  0    Follow-up: 3 months  [place] DO [facility]

## 2016-09-04 NOTE — Assessment & Plan Note (Signed)
Uric acid 8.1. Need to discuss restarting allopurinol and prophylactic colchicine.

## 2016-09-06 NOTE — Progress Notes (Signed)
Jayce,  If she is asymptomatic I would not recommend starting urate lowering therapy or colchicine if she has not had a gout flare in >12 months.  I did not see a recent one in her chart.  Let me know if you still want to start treatment and I will help with dosing.   Bennye Alm, PharmD, [place] PGY2 Pharmacy Resident [phone]

## 2016-09-18 ENCOUNTER — Telehealth: Payer: Self-pay | Admitting: *Deleted

## 2016-09-18 NOTE — Telephone Encounter (Signed)
Niece requested Lab results. Patient also continues to have headaches, that se discussed with Dr. Lacinda Axon. She has  Been scheduled to see him on 04/26  [place]

## 2016-09-18 NOTE — Telephone Encounter (Signed)
The niece was called back per DPr and was given lab results again due to her forgetting when given previous.

## 2016-09-20 ENCOUNTER — Ambulatory Visit (INDEPENDENT_AMBULATORY_CARE_PROVIDER_SITE_OTHER): Payer: Medicare Other | Admitting: Family Medicine

## 2016-09-20 ENCOUNTER — Encounter: Payer: Self-pay | Admitting: Family Medicine

## 2016-09-20 DIAGNOSIS — R51 Headache: Secondary | ICD-10-CM | POA: Diagnosis not present

## 2016-09-20 DIAGNOSIS — R519 Headache, unspecified: Secondary | ICD-10-CM

## 2016-09-20 MED ORDER — ACETAMINOPHEN 500 MG PO TABS: 1000.0000 mg | ORAL_TABLET | Freq: Three times a day (TID) | ORAL | 0 refills | Status: DC

## 2016-09-20 NOTE — Assessment & Plan Note (Signed)
New problem. Uncertain etiology/prognosis at this time. Favor tension headache. Trial of Tylenol 1000 mg TID.

## 2016-09-20 NOTE — Patient Instructions (Addendum)
Tylenol 3 times daily (1000 mg 3 times daily).  If she is doing well, she can discontinue.  Have them check her Blood pressure regularly.  Continue your other medications.  Take care  Dr. Lacinda Axon

## 2016-09-20 NOTE — Progress Notes (Signed)
   Subjective:  Patient ID: Ashley [name], female    DOB: [birth year]  Age: 83 y.o. MRN: [id number]  CC: Headache  HPI:  83 year old female with an extensive past medical history presents with complaints of headache.  Headache  Patient is a poor historian.  It appears that she's having ongoing intermittent headache.  She seems to state that it's bitemporal. Described as dull. Mild in severity.  She's had some associated nausea at times but none recently. No reports of photophobia or phonophobia. No focal weakness.  She uses Tylenol with improvement.  She's also taken aspirin with improvement.  She cannot tell me how often these occur.  Her family members are quite concerned.  Social Hx   Social History   Social History  . Marital status: Widowed    Spouse name: N/A  . Number of children: N/A  . Years of education: N/A   Social History Main Topics  . Smoking status: Never Smoker  . Smokeless tobacco: Never Used  . Alcohol use No  . Drug use: No  . Sexual activity: Not Currently   Other Topics Concern  . None   Social History Narrative  . None   Review of Systems  Constitutional: Negative.   Neurological: Positive for headaches.   Objective:  BP (!) 166/92   Pulse (!) 51   Temp 97.4 F (36.3 C) (Oral)   Wt 135 lb (61.2 kg)   SpO2 95%   BMI 23.91 kg/m   BP/Weight 09/20/2016 09/04/2016 10/27/930  Systolic BP 355 732 202  Diastolic BP 92 74 82  Wt. (Lbs) 135 132.5 127  BMI 23.91 23.47 20.5   Physical Exam  Constitutional: She appears well-developed. No distress.  Eyes: Pupils are equal, round, and reactive to light.  Cardiovascular:  Regularly irregular (likely from ectopy).   Pulmonary/Chest: Effort normal and breath sounds normal.  Neurological: She is alert.  No apparent focal deficits.  Psychiatric: She has a normal mood and affect.  Vitals reviewed.   Lab Results  Component Value Date   WBC 6.4 09/04/2016   HGB 12.0 09/04/2016   HCT  38.0 09/04/2016   PLT 221.0 09/04/2016   GLUCOSE 154 (H) 09/04/2016   CHOL 141 09/04/2016   TRIG 208.0 (H) 09/04/2016   HDL 50.40 09/04/2016   LDLDIRECT 58.0 09/04/2016   LDLCALC 119 (H) 12/02/2015   ALT 10 09/04/2016   AST 18 09/04/2016   NA 140 09/04/2016   K 4.0 09/04/2016   CL 105 09/04/2016   CREATININE 1.08 09/04/2016   BUN 28 (H) 09/04/2016   CO2 25 09/04/2016   TSH 0.08 (L) 09/04/2016   INR 1.12 01/23/2015   HGBA1C 6.7 (H) 09/04/2016    Assessment & Plan:   Problem List Items Addressed This Visit    Headache    New problem. Uncertain etiology/prognosis at this time. Favor tension headache. Trial of Tylenol 1000 mg TID.      Relevant Medications   acetaminophen (TYLENOL) 500 MG tablet     Meds ordered this encounter  Medications  . acetaminophen (TYLENOL) 500 MG tablet    Sig: Take 2 tablets (1,000 mg total) by mouth 3 (three) times daily.    Dispense:  30 tablet    Refill:  0   Follow-up: Return in about 1 month (around 10/20/2016) for follow up.[place]

## 2016-09-20 NOTE — Progress Notes (Signed)
Pre visit review using our clinic review tool, if applicable. No additional management support is needed unless otherwise documented below in the visit note. 

## 2016-10-05 ENCOUNTER — Encounter: Payer: Self-pay | Admitting: Emergency Medicine

## 2016-10-05 ENCOUNTER — Emergency Department: Payer: Medicare Other

## 2016-10-05 ENCOUNTER — Telehealth: Payer: Self-pay | Admitting: Family Medicine

## 2016-10-05 ENCOUNTER — Observation Stay
Admission: EM | Admit: 2016-10-05 | Discharge: 2016-10-07 | Disposition: A | Discharge status: Home / Self Care | Payer: Medicare Other | Attending: Internal Medicine | Admitting: Internal Medicine

## 2016-10-05 DIAGNOSIS — E1122 Type 2 diabetes mellitus with diabetic chronic kidney disease: Secondary | ICD-10-CM | POA: Diagnosis not present

## 2016-10-05 DIAGNOSIS — I2510 Atherosclerotic heart disease of native coronary artery without angina pectoris: Secondary | ICD-10-CM | POA: Diagnosis not present

## 2016-10-05 DIAGNOSIS — Z7902 Long term (current) use of antithrombotics/antiplatelets: Secondary | ICD-10-CM | POA: Diagnosis not present

## 2016-10-05 DIAGNOSIS — E785 Hyperlipidemia, unspecified: Secondary | ICD-10-CM | POA: Diagnosis not present

## 2016-10-05 DIAGNOSIS — Z8673 Personal history of transient ischemic attack (TIA), and cerebral infarction without residual deficits: Secondary | ICD-10-CM | POA: Diagnosis not present

## 2016-10-05 DIAGNOSIS — R778 Other specified abnormalities of plasma proteins: Secondary | ICD-10-CM

## 2016-10-05 DIAGNOSIS — R7989 Other specified abnormal findings of blood chemistry: Secondary | ICD-10-CM

## 2016-10-05 DIAGNOSIS — D696 Thrombocytopenia, unspecified: Secondary | ICD-10-CM

## 2016-10-05 DIAGNOSIS — Z7982 Long term (current) use of aspirin: Secondary | ICD-10-CM | POA: Diagnosis not present

## 2016-10-05 DIAGNOSIS — R42 Dizziness and giddiness: Secondary | ICD-10-CM | POA: Diagnosis not present

## 2016-10-05 DIAGNOSIS — Z86718 Personal history of other venous thrombosis and embolism: Secondary | ICD-10-CM | POA: Insufficient documentation

## 2016-10-05 DIAGNOSIS — I6529 Occlusion and stenosis of unspecified carotid artery: Secondary | ICD-10-CM | POA: Diagnosis not present

## 2016-10-05 DIAGNOSIS — R748 Abnormal levels of other serum enzymes: Secondary | ICD-10-CM | POA: Insufficient documentation

## 2016-10-05 DIAGNOSIS — Z79899 Other long term (current) drug therapy: Secondary | ICD-10-CM | POA: Diagnosis not present

## 2016-10-05 DIAGNOSIS — I700 Atherosclerosis of aorta: Secondary | ICD-10-CM | POA: Diagnosis not present

## 2016-10-05 DIAGNOSIS — E039 Hypothyroidism, unspecified: Secondary | ICD-10-CM | POA: Diagnosis not present

## 2016-10-05 DIAGNOSIS — D649 Anemia, unspecified: Secondary | ICD-10-CM | POA: Diagnosis not present

## 2016-10-05 DIAGNOSIS — I10 Essential (primary) hypertension: Secondary | ICD-10-CM | POA: Diagnosis not present

## 2016-10-05 DIAGNOSIS — R51 Headache: Secondary | ICD-10-CM | POA: Diagnosis not present

## 2016-10-05 DIAGNOSIS — N183 Chronic kidney disease, stage 3 unspecified: Secondary | ICD-10-CM

## 2016-10-05 DIAGNOSIS — I129 Hypertensive chronic kidney disease with stage 1 through stage 4 chronic kidney disease, or unspecified chronic kidney disease: Secondary | ICD-10-CM | POA: Diagnosis not present

## 2016-10-05 DIAGNOSIS — Z66 Do not resuscitate: Secondary | ICD-10-CM | POA: Diagnosis not present

## 2016-10-05 DIAGNOSIS — R531 Weakness: Principal | ICD-10-CM

## 2016-10-05 LAB — URINALYSIS, COMPLETE (UACMP) WITH MICROSCOPIC
Bilirubin Urine: NEGATIVE
GLUCOSE, UA: NEGATIVE mg/dL
KETONES UR: NEGATIVE mg/dL
LEUKOCYTES UA: NEGATIVE
Nitrite: NEGATIVE
PROTEIN: 30 mg/dL — AB
Specific Gravity, Urine: 1.012 (ref 1.005–1.030)
pH: 5 (ref 5.0–8.0)

## 2016-10-05 LAB — CBC
HEMATOCRIT: 25 % — AB (ref 35.0–47.0)
HEMOGLOBIN: 8 g/dL — AB (ref 12.0–16.0)
MCH: 26.8 pg (ref 26.0–34.0)
MCHC: 31.8 g/dL — ABNORMAL LOW (ref 32.0–36.0)
MCV: 84.2 fL (ref 80.0–100.0)
Platelets: 191 10*3/uL (ref 150–440)
RBC: 2.97 MIL/uL — AB (ref 3.80–5.20)
RDW: 19.8 % — AB (ref 11.5–14.5)
WBC: 5.9 10*3/uL (ref 3.6–11.0)

## 2016-10-05 LAB — COMPREHENSIVE METABOLIC PANEL
ALK PHOS: 84 U/L (ref 38–126)
ALT: 11 U/L — AB (ref 14–54)
AST: 30 U/L (ref 15–41)
Albumin: 3.5 g/dL (ref 3.5–5.0)
Anion gap: 8 (ref 5–15)
BUN: 29 mg/dL — AB (ref 6–20)
CALCIUM: 9.2 mg/dL (ref 8.9–10.3)
CHLORIDE: 106 mmol/L (ref 101–111)
CO2: 24 mmol/L (ref 22–32)
CREATININE: 1.11 mg/dL — AB (ref 0.44–1.00)
GFR calc Af Amer: 47 mL/min — ABNORMAL LOW (ref >60)
GFR, EST NON AFRICAN AMERICAN: 41 mL/min — AB (ref >60)
Glucose, Bld: 104 mg/dL — ABNORMAL HIGH (ref 65–99)
Potassium: 4.4 mmol/L (ref 3.5–5.1)
Sodium: 138 mmol/L (ref 135–145)
Total Bilirubin: 1.5 mg/dL — ABNORMAL HIGH (ref 0.3–1.2)
Total Protein: 6.9 g/dL (ref 6.5–8.1)

## 2016-10-05 LAB — DIFFERENTIAL
BASOS ABS: 0.1 10*3/uL (ref 0–0.1)
BASOS PCT: 2 %
Eosinophils Absolute: 0.1 10*3/uL (ref 0–0.7)
Eosinophils Relative: 1 %
LYMPHS PCT: 21 %
Lymphs Abs: 1.3 10*3/uL (ref 1.0–3.6)
MONOS PCT: 14 %
Monocytes Absolute: 0.9 10*3/uL (ref 0.2–0.9)
NEUTROS ABS: 3.7 10*3/uL (ref 1.4–6.5)
Neutrophils Relative %: 62 %

## 2016-10-05 LAB — HEMOGLOBIN: Hemoglobin: 8.3 g/dL — ABNORMAL LOW (ref 12.0–16.0)

## 2016-10-05 LAB — TROPONIN I: TROPONIN I: 0.04 ng/mL — AB (ref <0.03)

## 2016-10-05 MED ORDER — ACETAMINOPHEN 500 MG PO TABS
1000.0000 mg | ORAL_TABLET | Freq: Three times a day (TID) | ORAL | Status: DC
  Administered 2016-10-05 – 2016-10-07 (×5): 1000 mg via ORAL
  Filled 2016-10-05 (×5): qty 2

## 2016-10-05 MED ORDER — ASPIRIN EC 81 MG PO TBEC
81.0000 mg | DELAYED_RELEASE_TABLET | Freq: Every day | ORAL | Status: DC
  Administered 2016-10-06 – 2016-10-07 (×2): 81 mg via ORAL
  Filled 2016-10-05 (×5): qty 1

## 2016-10-05 MED ORDER — COLCHICINE 0.6 MG PO TABS
0.6000 mg | ORAL_TABLET | Freq: Every day | ORAL | Status: DC
  Administered 2016-10-06 – 2016-10-07 (×2): 0.6 mg via ORAL
  Filled 2016-10-05 (×2): qty 1

## 2016-10-05 MED ORDER — LEVOTHYROXINE SODIUM 100 MCG PO TABS
100.0000 ug | ORAL_TABLET | Freq: Every day | ORAL | Status: DC
  Administered 2016-10-06 – 2016-10-07 (×2): 100 ug via ORAL
  Filled 2016-10-05 (×2): qty 1

## 2016-10-05 MED ORDER — DOCUSATE SODIUM 100 MG PO CAPS: 100.0000 mg | ORAL_CAPSULE | Freq: Two times a day (BID) | ORAL | Status: DC | PRN

## 2016-10-05 MED ORDER — LOSARTAN POTASSIUM 50 MG PO TABS
50.0000 mg | ORAL_TABLET | Freq: Every day | ORAL | Status: DC
  Administered 2016-10-06 – 2016-10-07 (×2): 50 mg via ORAL
  Filled 2016-10-05 (×2): qty 1

## 2016-10-05 MED ORDER — SODIUM CHLORIDE 0.9 % IV SOLN
Freq: Once | INTRAVENOUS | Status: AC
  Administered 2016-10-05: via INTRAVENOUS

## 2016-10-05 MED ORDER — ATORVASTATIN CALCIUM 20 MG PO TABS
20.0000 mg | ORAL_TABLET | Freq: Every day | ORAL | Status: DC
  Administered 2016-10-06: 20 mg via ORAL
  Filled 2016-10-05: qty 1

## 2016-10-05 NOTE — ED Notes (Signed)
Pt using toilet

## 2016-10-05 NOTE — H&P (Signed)
[facility] NAME: Ashley [name]    MR#:  [id number]  DATE OF BIRTH:  Apr 23, 1919  DATE OF ADMISSION:  10/05/2016  PRIMARY CARE PHYSICIAN: Coral Spikes, DO   REQUESTING/REFERRING PHYSICIAN: Paduchowski  CHIEF COMPLAINT:   Chief Complaint  Patient presents with  . Headache    HISTORY OF PRESENT ILLNESS: Ashley [name]  is a 83 y.o. female with a known history of Aortic atherosclerosis, carotid stenosis, diverticulitis, coronary atherosclerosis, diabetes, hyperlipidemia, hypertension, hypothyroidism- lives in assisted living place with her sister, her niece and nephew are power of attorney and caretaker. For last few months she has residual weakness and dizziness episodes so they decided to bring her to emergency room today. On further questioning patient denies any complaints. Her niece on phone denies any episodes of bleeding, fever, infections. In ER patient was noted to have hemoglobin of 8, it was more than 12 three months ago. Concerned with this, ER physician suspected most likely patient is symptomatic due to low hemoglobin and her niece agreed to receive blood transfusion and keep the patient in the hospital for further observation and management.  PAST MEDICAL HISTORY:   Past Medical History:  Diagnosis Date  . Aortic atherosclerosis ([place]) 09/04/2016  . Carotid stenosis 12/03/2015   Advanced heavily calcified atherosclerotic disease at both carotid bifurcations. Stenoses measured at 70% affecting the right proximal ICA and 80% affecting the left proximal ICA.  Marland Kitchen Coronary atherosclerosis 09/04/2016  . Diverticulitis   . DM (diabetes mellitus), type 2 ([place]) 09/04/2016  . Gout   . Hyperlipidemia 06/20/2016  . Hypertension   . Hypothyroid   . Osteoarthritis   . Stroke [facility])     PAST SURGICAL HISTORY: Past Surgical History:  Procedure Laterality Date  . CATARACT EXTRACTION    . Finger crystal removal     right 3rd  . toe crystal  removal     right 1st toe    SOCIAL HISTORY:  Social History  Substance Use Topics  . Smoking status: Never Smoker  . Smokeless tobacco: Never Used  . Alcohol use No    FAMILY HISTORY:  Family History  Problem Relation Age of Onset  . Hypertension Sister   . Arthritis Sister   . Heart disease Sister   . Alcohol abuse Brother   . Stroke Brother   . Hypertension Brother   . Alcohol abuse Brother   . Stroke Brother   . Hypertension Brother   . Hypertension Brother   . Hypertension Sister     DRUG ALLERGIES: No Known Allergies  REVIEW OF SYSTEMS:   CONSTITUTIONAL: No fever,Positive for fatigue or weakness.  EYES: No blurred or double vision.  EARS, NOSE, AND THROAT: No tinnitus or ear pain.  RESPIRATORY: No cough, shortness of breath, wheezing or hemoptysis.  CARDIOVASCULAR: No chest pain, orthopnea, edema.  GASTROINTESTINAL: No nausea, vomiting, diarrhea or abdominal pain.  GENITOURINARY: No dysuria, hematuria.  ENDOCRINE: No polyuria, nocturia,  HEMATOLOGY: No anemia, easy bruising or bleeding SKIN: No rash or lesion. MUSCULOSKELETAL: No joint pain or arthritis.   NEUROLOGIC: No tingling, numbness, weakness.  PSYCHIATRY: No anxiety or depression.   MEDICATIONS AT HOME:  Prior to Admission medications   Medication Sig Start Date End Date Taking? Authorizing Provider  clopidogrel (PLAVIX) 75 MG tablet TAKE ONE TABLET BY MOUTH ONCE DAILY 05/11/16  Yes Dew, Erskine Squibb, MD  levothyroxine (SYNTHROID, LEVOTHROID) 100 MCG tablet Take 1 tablet (100 mcg total) by mouth daily.  09/04/16  Yes Cook, Jayce G, DO  acetaminophen (TYLENOL) 500 MG tablet Take 2 tablets (1,000 mg total) by mouth 3 (three) times daily. 09/20/16   Coral Spikes, DO  aspirin EC 81 MG EC tablet Take 1 tablet (81 mg total) by mouth daily. 12/02/15   Hillary Bow, MD  atorvastatin (LIPITOR) 20 MG tablet Take 1 tablet (20 mg total) by mouth daily at 6 PM. 12/02/15   Hillary Bow, MD  Cholecalciferol (VITAMIN D3)  400 units CAPS 2 caps daily. Patient taking differently: Take 1 capsule by mouth every evening.  09/04/16   Coral Spikes, DO  colchicine 0.6 MG tablet Take 0.6 mg by mouth daily. 09/05/16   [provider]  losartan (COZAAR) 50 MG tablet Take 50 mg by mouth daily.    [provider]      PHYSICAL EXAMINATION:   VITAL SIGNS: Blood pressure 138/60, pulse 73, temperature 99.1 F (37.3 C), resp. rate 15, height 5\' 6"  (1.676 m), weight 63.5 kg (140 lb), SpO2 100 %.  GENERAL:  83 y.o.-year-old thin patient lying in the bed with no acute distress.  EYES: Pupils equal, round, reactive to light and accommodation. No scleral icterus. Extraocular muscles intact.  HEENT: Head atraumatic, normocephalic. Oropharynx and nasopharynx clear.  NECK:  Supple, no jugular venous distention. No thyroid enlargement, no tenderness.  LUNGS: Normal breath sounds bilaterally, no wheezing, rales,rhonchi or crepitation. No use of accessory muscles of respiration.  CARDIOVASCULAR: S1, S2 normal. No murmurs, rubs, or gallops.  ABDOMEN: Soft, nontender, nondistended. Bowel sounds present. No organomegaly or mass.  EXTREMITIES: No pedal edema, cyanosis, or clubbing.  NEUROLOGIC: Cranial nerves II through XII are intact. Muscle strength 4/5 in all extremities. Sensation intact. Gait not checked.  PSYCHIATRIC: The patient is alert and oriented x 3.  SKIN: No obvious rash, lesion, or ulcer.   LABORATORY PANEL:   CBC  Recent Labs Lab 10/05/16 1328 10/05/16 1657  WBC 5.9  --   HGB 8.0* 8.3*  HCT 25.0*  --   PLT 191  --   MCV 84.2  --   MCH 26.8  --   MCHC 31.8*  --   RDW 19.8*  --   LYMPHSABS 1.3  --   MONOABS 0.9  --   EOSABS 0.1  --   BASOSABS 0.1  --    ------------------------------------------------------------------------------------------------------------------  Chemistries   Recent Labs Lab 10/05/16 1328  NA 138  K 4.4  CL 106  CO2 24  GLUCOSE 104*  BUN 29*  CREATININE  1.11*  CALCIUM 9.2  AST 30  ALT 11*  ALKPHOS 84  BILITOT 1.5*   ------------------------------------------------------------------------------------------------------------------ estimated creatinine clearance is 28.4 mL/min (A) (by C-G formula based on SCr of 1.11 mg/dL (H)). ------------------------------------------------------------------------------------------------------------------ No results for input(s): TSH, T4TOTAL, T3FREE, THYROIDAB in the last 72 hours.  Invalid input(s): FREET3   Coagulation profile No results for input(s): INR, PROTIME in the last 168 hours. ------------------------------------------------------------------------------------------------------------------- No results for input(s): DDIMER in the last 72 hours. -------------------------------------------------------------------------------------------------------------------  Cardiac Enzymes  Recent Labs Lab 10/05/16 1328  TROPONINI 0.04*   ------------------------------------------------------------------------------------------------------------------ Invalid input(s): POCBNP  ---------------------------------------------------------------------------------------------------------------  Urinalysis    Component Value Date/Time   COLORURINE STRAW (A) 12/01/2015 2155   APPEARANCEUR CLEAR (A) 12/01/2015 2155   APPEARANCEUR Clear 08/29/2014 1555   LABSPEC 1.008 12/01/2015 2155   LABSPEC 1.006 08/29/2014 1555   PHURINE 7.0 12/01/2015 2155   GLUCOSEU NEGATIVE 12/01/2015 2155   GLUCOSEU Negative 08/29/2014 1555   HGBUR 1+ (A)  12/01/2015 2155   BILIRUBINUR NEGATIVE 12/01/2015 2155   BILIRUBINUR Negative 08/29/2014 [place] 12/01/2015 2155   PROTEINUR 100 (A) 12/01/2015 2155   NITRITE NEGATIVE 12/01/2015 2155   LEUKOCYTESUR NEGATIVE 12/01/2015 2155   LEUKOCYTESUR 2+ 08/29/2014 1555     RADIOLOGY: Ct Head Wo Contrast  Result Date: 10/05/2016 CLINICAL DATA:  Generalized  headache for 1-2 months. Nausea. Imbalance. Looking for stroke. EXAM: CT HEAD WITHOUT CONTRAST TECHNIQUE: Contiguous axial images were obtained from the base of the skull through the vertex without intravenous contrast. COMPARISON:  MR head 12/02/2015.  CT head 12/01/2015. FINDINGS: Brain: No evidence for acute infarction, hemorrhage, mass lesion, hydrocephalus, or extra-axial fluid. Generalized atrophy, not unexpected for age. Chronic microvascular ischemic change affecting the periventricular and subcortical white matter. Chronic cortical infarct, LEFT posterior frontal precentral gyrus. This was acute in 2017. Vascular: Vascular calcification in the carotid siphons. No signs of large vessel occlusion. Skull: Intact.  Negative for fracture or focal lesion. Sinuses/Orbits: No significant paranasal sinus disease. BILATERAL cataract extraction. Other: None. IMPRESSION: Atrophy and small vessel disease.  No acute intracranial findings. Evidence for chronic LEFT hemisphere infarct. Electronically Signed   By: Staci Righter M.D.   On: 10/05/2016 13:55    EKG: Orders placed or performed during the hospital encounter of 10/05/16  . ED EKG  . ED EKG    IMPRESSION AND PLAN:  * Generalized weakness and dizziness.   Acute anemia due to unknown reason.    Urinalysis still need to be collected.   As she has significant drop in hemoglobin, we'll give 1 unit of blood transfusion.   I spoke to patient's niece, power of attorney on phone, explain her about possible side effects and reactions with the blood transfusion and the requirement of blood transfusion at this point. She approved in agree for blood transfusion.   we will check for stool guaiac.    * Hypertension   We'll check her orthostatic vital signs.  * History of coronary artery disease and stroke   Patient takes aspirin and Plavix along with  Statin at home.   I will hold Plavix due to her very old age and drop in hemoglobin.  *  Hypothyroidism   Continue levothyroxine.  All the records are reviewed and case discussed with ED provider. Management plans discussed with the patient, family and they are in agreement.  CODE STATUS: DO NOT RESUSCITATE Code Status History    Date Active Date Inactive Code Status Order ID Comments User Context   12/01/2015  9:43 PM 12/03/2015  5:31 PM DNR [id number]  Quintella Baton, MD Inpatient   01/23/2015 10:05 PM 01/29/2015  2:52 PM DNR [id number]  [name], MD Inpatient    Questions for Most Recent Historical Code Status (Order [id number])    Question Answer Comment   In the event of cardiac or respiratory ARREST Do not call a "code blue"    In the event of cardiac or respiratory ARREST Do not perform Intubation, CPR, defibrillation or ACLS    In the event of cardiac or respiratory ARREST Use medication by any route, position, wound care, and other measures to relive pain and suffering. May use oxygen, suction and manual treatment of airway obstruction as needed for comfort.      Called the patient's niece and discussed the plan with her.  TOTAL TIME TAKING CARE OF THIS PATIENT:  50 minutes.    Vaughan Basta M.D on 10/05/2016   Between 7am to  6pm - Pager - [phone]  After 6pm go to [email or web address] - password [email or web address] [facility]  Office  [phone]  CC: Primary care physician; Coral Spikes, DO   Note: This dictation was prepared with Dragon dictation along with smaller phrase technology. Any transcriptional errors that result from this process are unintentional.

## 2016-10-05 NOTE — Telephone Encounter (Signed)
Spoken to [place], Informed her that we do not have any appt available today.  Directed her to go to ED due to [facility].  Patient has had stroke in the past.  Patient is having nausea, weakness, lethargic, and very dizzy.  Patients niece stated she will take patient to ED.

## 2016-10-05 NOTE — ED Notes (Signed)
Date and time results received: 10/05/16 2:18 PM (use smartphrase ".now" to insert current time)  Test: troponin Critical Value: 0.04  Name of Provider Notified: Dr. Corky Downs  Orders Received? Or Actions Taken?: Orders Received - See Orders for details

## 2016-10-05 NOTE — ED Provider Notes (Signed)
[facility] Emergency Department Provider Note  Time seen: 4:36 PM  I have reviewed the triage vital signs and the nursing notes.   HISTORY  Chief Complaint Headache    HPI Ashley [name] is a 83 y.o. female with a past medical history of diverticulitis, diabetes, gout, hyperlipidemia, hypertension, CVA, presents to the emergency department for an off-balance sensation. According to the patient and her niece for the past 6 weeks or so the patient has been experiencing intermittent headaches. Patient denies any headache currently. She states for the past 1-2 weeks she has been feeling off balance especially with standing, and has noted some dyspnea with exertion. Denies any chest pain, trouble breathing, abdominal pain, vomiting or diarrhea. Denies any black or bloody stool. Denies any focal weakness or numbness. Largely negative review of systems.  Past Medical History:  Diagnosis Date  . Aortic atherosclerosis ([place]) 09/04/2016  . Carotid stenosis 12/03/2015   Advanced heavily calcified atherosclerotic disease at both carotid bifurcations. Stenoses measured at 70% affecting the right proximal ICA and 80% affecting the left proximal ICA.  Marland Kitchen Coronary atherosclerosis 09/04/2016  . Diverticulitis   . DM (diabetes mellitus), type 2 ([place]) 09/04/2016  . Gout   . Hyperlipidemia 06/20/2016  . Hypertension   . Hypothyroid   . Osteoarthritis   . Stroke [facility])     Patient Active Problem List   Diagnosis Date Noted  . Headache 09/20/2016  . Aortic atherosclerosis ([place]) 09/04/2016  . Coronary atherosclerosis 09/04/2016  . DM (diabetes mellitus), type 2 ([place]) 09/04/2016  . CKD (chronic kidney disease) stage 3, GFR 30-59 ml/min 09/04/2016  . Hyperlipidemia 06/20/2016  . History of DVT (deep vein thrombosis) 04/17/2016  . Carotid stenosis 12/03/2015  . History of stroke 12/02/2015  . Essential hypertension 12/01/2015  . Gout 12/01/2015  . Osteoarthritis 12/01/2015  .  Hypothyroidism 12/01/2015  . History of GI bleed 01/23/2015    Past Surgical History:  Procedure Laterality Date  . CATARACT EXTRACTION    . Finger crystal removal     right 3rd  . toe crystal removal     right 1st toe    Prior to Admission medications   Medication Sig Start Date End Date Taking? Authorizing Provider  acetaminophen (TYLENOL) 500 MG tablet Take 2 tablets (1,000 mg total) by mouth 3 (three) times daily. 09/20/16   Coral Spikes, DO  aspirin EC 81 MG EC tablet Take 1 tablet (81 mg total) by mouth daily. 12/02/15   Hillary Bow, MD  atorvastatin (LIPITOR) 20 MG tablet Take 1 tablet (20 mg total) by mouth daily at 6 PM. 12/02/15   Hillary Bow, MD  Cholecalciferol (VITAMIN D3) 400 units CAPS 2 caps daily. 09/04/16   Coral Spikes, DO  clopidogrel (PLAVIX) 75 MG tablet TAKE ONE TABLET BY MOUTH ONCE DAILY 05/11/16   [name], MD  levothyroxine (SYNTHROID, LEVOTHROID) 100 MCG tablet Take 1 tablet (100 mcg total) by mouth daily. 09/04/16   Coral Spikes, DO  losartan (COZAAR) 50 MG tablet Take 50 mg by mouth daily.    [provider]    No Known Allergies  Family History  Problem Relation Age of Onset  . Hypertension Sister   . Arthritis Sister   . Heart disease Sister   . Alcohol abuse Brother   . Stroke Brother   . Hypertension Brother   . Alcohol abuse Brother   . Stroke Brother   . Hypertension Brother   . Hypertension Brother   .  Hypertension Sister     Social History Social History  Substance Use Topics  . Smoking status: Never Smoker  . Smokeless tobacco: Never Used  . Alcohol use No    Review of Systems Constitutional: Negative for fever. Cardiovascular: Negative for chest pain. Respiratory: Negative for shortness of breath. Gastrointestinal: Negative for abdominal pain, vomiting and diarrhea. Genitourinary: Negative for dysuria. Musculoskeletal: Negative for back pain. Skin: Negative for rash. Neurological: Negative for headaches,  focal weakness or numbness. All other ROS negative  ____________________________________________   PHYSICAL EXAM:  VITAL SIGNS: ED Triage Vitals  Enc Vitals Group     BP 10/05/16 1315 (!) 157/61     Pulse Rate 10/05/16 1315 85     Resp 10/05/16 1315 18     Temp 10/05/16 1315 99.1 F (37.3 C)     Temp Source 10/05/16 1315 Oral     SpO2 10/05/16 1315 100 %     Weight 10/05/16 1316 140 lb (63.5 kg)     Height 10/05/16 1316 5\' 6"  (1.676 m)     Head Circumference --      Peak Flow --      Pain Score --      Pain Loc --      Pain Edu? --      Excl. in [place]? --     Constitutional: Alert and oriented. Well appearing and in no distress. Eyes: Normal exam ENT   Head: Normocephalic and atraumatic.   Mouth/Throat: Mucous membranes are moist. Cardiovascular: Normal rate, regular rhythm. No murmur Respiratory: Normal respiratory effort without tachypnea nor retractions. Breath sounds are clear Gastrointestinal: Soft and nontender. No distention.   Musculoskeletal: Nontender with normal range of motion in all extremities.  Neurologic:  Normal speech and language. No gross focal neurologic deficits  Skin:  Skin is warm, dry and intact.  Psychiatric: Mood and affect are normal.  ____________________________________________    EKG  EKG reviewed and interpreted by myself shows sinus rhythm at 86 bpm, slightly widened QRS, normal axis, largely normal intervals with nonspecific ST changes.  ____________________________________________    RADIOLOGY  CT negative for acute abnormality.  ____________________________________________   INITIAL IMPRESSION / ASSESSMENT AND PLAN / ED COURSE  Pertinent labs & imaging results that were available during my care of the patient were reviewed by me and considered in my medical decision making (see chart for details).  The patient presents to the emergency department for off-balance feeling as well as dyspnea with exertion over the past  1-2 weeks. Patient also states intermittent headaches over the past 6 weeks or so but denies any currently. On exam the patient appears very well, no distress, appears younger than stated age. Intact gross neurological exam. Largely negative review of systems. On the patient's lab workup she does have a low hemoglobin 8.0 this appears to have declined from last month. Patient CT head is negative. Patient denies any black or bloody stool. We will perform a rectal examination to rule out GI bleed. Patient's anemia could very likely be the cause of her off-balance sensation as well as dyspnea on exertion.  Repeat hemoglobin is largely unchanged representing a significant decline from 1 month ago. Patient's rectal exam is negative. We will admit to the hospital for symptomatic anemia.  ____________________________________________   FINAL CLINICAL IMPRESSION(S) / ED DIAGNOSES  Symptomatic anemia    Harvest Dark, MD 10/05/16 1759

## 2016-10-05 NOTE — Telephone Encounter (Signed)
Pt niece Kieth Brightly called and stated that pt is swimmy headed, dizzy, nauseous, weak and tired. No available appts today. Please advise, thank you!  Call pt @ [phone]

## 2016-10-05 NOTE — ED Triage Notes (Signed)
Pt has had generalized headache on and off for 1-2 months.  Has seen PCP more than once for this.  Also has had nausea with headaches. Denies dizziness or weakness.  Family reports off balance.  PCP sent pt to make sure she has not had a stroke.  Grip strength equal. No facial droop. Sx for at least 6 weeks per family.

## 2016-10-06 ENCOUNTER — Encounter: Payer: Self-pay | Admitting: *Deleted

## 2016-10-06 DIAGNOSIS — R748 Abnormal levels of other serum enzymes: Secondary | ICD-10-CM | POA: Diagnosis not present

## 2016-10-06 DIAGNOSIS — R7989 Other specified abnormal findings of blood chemistry: Secondary | ICD-10-CM

## 2016-10-06 DIAGNOSIS — N183 Chronic kidney disease, stage 3 unspecified: Secondary | ICD-10-CM

## 2016-10-06 DIAGNOSIS — R42 Dizziness and giddiness: Secondary | ICD-10-CM

## 2016-10-06 DIAGNOSIS — D509 Iron deficiency anemia, unspecified: Secondary | ICD-10-CM | POA: Diagnosis not present

## 2016-10-06 DIAGNOSIS — D696 Thrombocytopenia, unspecified: Secondary | ICD-10-CM

## 2016-10-06 DIAGNOSIS — I10 Essential (primary) hypertension: Secondary | ICD-10-CM | POA: Diagnosis not present

## 2016-10-06 DIAGNOSIS — R531 Weakness: Secondary | ICD-10-CM | POA: Diagnosis not present

## 2016-10-06 DIAGNOSIS — R778 Other specified abnormalities of plasma proteins: Secondary | ICD-10-CM

## 2016-10-06 LAB — BASIC METABOLIC PANEL
Anion gap: 6 (ref 5–15)
BUN: 28 mg/dL — ABNORMAL HIGH (ref 6–20)
CALCIUM: 8.4 mg/dL — AB (ref 8.9–10.3)
CO2: 26 mmol/L (ref 22–32)
CREATININE: 1.08 mg/dL — AB (ref 0.44–1.00)
Chloride: 107 mmol/L (ref 101–111)
GFR calc non Af Amer: 42 mL/min — ABNORMAL LOW (ref >60)
GFR, EST AFRICAN AMERICAN: 49 mL/min — AB (ref >60)
Glucose, Bld: 109 mg/dL — ABNORMAL HIGH (ref 65–99)
Potassium: 4.2 mmol/L (ref 3.5–5.1)
SODIUM: 139 mmol/L (ref 135–145)

## 2016-10-06 LAB — CBC
HCT: 27.1 % — ABNORMAL LOW (ref 35.0–47.0)
Hemoglobin: 9.1 g/dL — ABNORMAL LOW (ref 12.0–16.0)
MCH: 27.6 pg (ref 26.0–34.0)
MCHC: 33.5 g/dL (ref 32.0–36.0)
MCV: 82.3 fL (ref 80.0–100.0)
Platelets: 146 10*3/uL — ABNORMAL LOW (ref 150–440)
RBC: 3.3 MIL/uL — AB (ref 3.80–5.20)
RDW: 18.1 % — AB (ref 11.5–14.5)
WBC: 3.9 10*3/uL (ref 3.6–11.0)

## 2016-10-06 LAB — HEMOGLOBIN
Hemoglobin: 9.4 g/dL — ABNORMAL LOW (ref 12.0–16.0)
Hemoglobin: 9.1 g/dL — ABNORMAL LOW (ref 12.0–16.0)

## 2016-10-06 LAB — TROPONIN I: TROPONIN I: 0.04 ng/mL — AB (ref <0.03)

## 2016-10-06 NOTE — Care Management Note (Signed)
Case Management Note  Patient Details  Name: Ashley [name] MRN: [id number] Date of Birth: [birth year]  Subjective/Objective:   This Probation officer provided [name] with a Animator and information about Observation Status under Medicare guidelines. This Probation officer stood on the right side of [name] as instructed by nephew and spoke as loudly as she could while nephew kept repeating "She cannot hear you."  This Probation officer asked nephew if he could communicate with [name] and relay the [employer] letter information being given but he walked to the other side of the room and stood instead. [place] letter was left with [name]. Nephew stated "She cannot read either." Will attempt to revisit [name] tomorrow about the [employer].                  Action/Plan:   Expected Discharge Date:                  Expected Discharge Plan:     In-House Referral:     Discharge planning Services     Post Acute Care Choice:    Choice offered to:     DME Arranged:    DME Agency:     HH Arranged:    HH Agency:     Status of Service:     If discussed at [employer] of Stay Meetings, dates discussed:    Additional Comments:  Tabari Volkert A, RN 10/06/2016, 5:08 PM

## 2016-10-06 NOTE — Progress Notes (Signed)
[facility] NAME: Ashley [name]    MR#:  [id number]  DATE OF BIRTH:  Jun 11, 1918  SUBJECTIVE:  CHIEF COMPLAINT:   Chief Complaint  Patient presents with  . Headache  The patient is a 83 year old female with past medical history significant for history of CAD stage III, carotid stenosis, diverticulitis, diabetes, hyperlipidemia, hypertension, hypothyroidism, who presents to the hospital with weakness and dizziness. In emergency room, she was noted to be more anemic, hemoglobin level dropping down by 3 g from 3 months ago. Patient was admitted to the hospital for further evaluation and treatment, she was transfused packed red blood cells after which hemoglobin level improved to 9.4. Patient admits of using Advil for minor pains, however, tells me that it's not more than twice a day. Hemoccult was negative. In emergency room. No black or tarry stools, no rectal bleeding. No hematemesis was noted. Gastroenterologist consultation is pending.  The patient feels good today after transfusion. Physical therapist evaluation was done, home health services were recommended.  Review of Systems  Constitutional: Negative for chills, fever and weight loss.  HENT: Negative for congestion.   Eyes: Negative for blurred vision and double vision.  Respiratory: Negative for cough, sputum production, shortness of breath and wheezing.   Cardiovascular: Negative for chest pain, palpitations, orthopnea, leg swelling and PND.  Gastrointestinal: Negative for abdominal pain, blood in stool, constipation, diarrhea, nausea and vomiting.  Genitourinary: Negative for dysuria, frequency, hematuria and urgency.  Musculoskeletal: Negative for falls.  Neurological: Negative for dizziness, tremors, focal weakness and headaches.  Endo/Heme/Allergies: Does not bruise/bleed easily.  Psychiatric/Behavioral: Negative for depression. The patient does not have insomnia.      VITAL SIGNS: Blood pressure 120/60, pulse 76, temperature 98.5 F (36.9 C), temperature source Oral, resp. rate 18, height 5\' 6"  (1.676 m), weight 63.5 kg (140 lb), SpO2 100 %.  PHYSICAL EXAMINATION:   GENERAL:  83 y.o.-year-old patient lying in the bed with no acute distress.  EYES: Pupils equal, round, reactive to light and accommodation. No scleral icterus. Extraocular muscles intact.  HEENT: Head atraumatic, normocephalic. Oropharynx and nasopharynx clear.  NECK:  Supple, no jugular venous distention. No thyroid enlargement, no tenderness.  LUNGS: Normal breath sounds bilaterally, no wheezing, rales,rhonchi or crepitation. No use of accessory muscles of respiration.  CARDIOVASCULAR: S1, S2 normal. No murmurs, rubs, or gallops.  ABDOMEN: Soft, nontender, nondistended. Bowel sounds present. No organomegaly or mass.  EXTREMITIES: No pedal edema, cyanosis, or clubbing.  NEUROLOGIC: Cranial nerves II through XII are intact. Muscle strength 5/5 in all extremities. Sensation intact. Gait not checked.  PSYCHIATRIC: The patient is alert and oriented x 3.  SKIN: No obvious rash, lesion, or ulcer.   ORDERS/RESULTS REVIEWED:   CBC  Recent Labs Lab 10/05/16 1328 10/05/16 1657 10/06/16 0406 10/06/16 1204  WBC 5.9  --  3.9  --   HGB 8.0* 8.3* 9.1* 9.4*  HCT 25.0*  --  27.1*  --   PLT 191  --  146*  --   MCV 84.2  --  82.3  --   MCH 26.8  --  27.6  --   MCHC 31.8*  --  33.5  --   RDW 19.8*  --  18.1*  --   LYMPHSABS 1.3  --   --   --   MONOABS 0.9  --   --   --   EOSABS 0.1  --   --   --  BASOSABS 0.1  --   --   --    ------------------------------------------------------------------------------------------------------------------  Chemistries   Recent Labs Lab 10/05/16 1328 10/06/16 0406  NA 138 139  K 4.4 4.2  CL 106 107  CO2 24 26  GLUCOSE 104* 109*  BUN 29* 28*  CREATININE 1.11* 1.08*  CALCIUM 9.2 8.4*  AST 30  --   ALT 11*  --   ALKPHOS 84  --   BILITOT 1.5*   --    ------------------------------------------------------------------------------------------------------------------ estimated creatinine clearance is 29.2 mL/min (A) (by C-G formula based on SCr of 1.08 mg/dL (H)). ------------------------------------------------------------------------------------------------------------------ No results for input(s): TSH, T4TOTAL, T3FREE, THYROIDAB in the last 72 hours.  Invalid input(s): FREET3  Cardiac Enzymes  Recent Labs Lab 10/05/16 1328 10/06/16 0406  TROPONINI 0.04* 0.04*   ------------------------------------------------------------------------------------------------------------------ Invalid input(s): POCBNP ---------------------------------------------------------------------------------------------------------------  RADIOLOGY: Ct Head Wo Contrast  Result Date: 10/05/2016 CLINICAL DATA:  Generalized headache for 1-2 months. Nausea. Imbalance. Looking for stroke. EXAM: CT HEAD WITHOUT CONTRAST TECHNIQUE: Contiguous axial images were obtained from the base of the skull through the vertex without intravenous contrast. COMPARISON:  MR head 12/02/2015.  CT head 12/01/2015. FINDINGS: Brain: No evidence for acute infarction, hemorrhage, mass lesion, hydrocephalus, or extra-axial fluid. Generalized atrophy, not unexpected for age. Chronic microvascular ischemic change affecting the periventricular and subcortical white matter. Chronic cortical infarct, LEFT posterior frontal precentral gyrus. This was acute in 2017. Vascular: Vascular calcification in the carotid siphons. No signs of large vessel occlusion. Skull: Intact.  Negative for fracture or focal lesion. Sinuses/Orbits: No significant paranasal sinus disease. BILATERAL cataract extraction. Other: None. IMPRESSION: Atrophy and small vessel disease.  No acute intracranial findings. Evidence for chronic LEFT hemisphere infarct. Electronically Signed   By: Staci Righter M.D.   On: 10/05/2016  13:55    EKG:  Orders placed or performed during the hospital encounter of 10/05/16  . ED EKG  . ED EKG    ASSESSMENT AND PLAN:  Principal Problem:   Symptomatic anemia Active Problems:   Elevated troponin   Thrombocytopenia (HCC)   Generalized weakness   Dizziness   CKD (chronic kidney disease), stage III  #1. Generalized weakness and dizziness, etiology is unclear, get orthostatic vital signs, rehydrate if needed, patient was transfused packed red blood cells after which hemoglobin level has improved, she was seen by physical therapist and home health services were recommended only #2. Anemia, Hemoccult negative, however, suspected chronic gastrointestinal blood loss due to patient using nonsteroidal anti-inflammatory medications, patient is to be seen by gastroenterologist, possible outpatient workup, awaiting for recommendations. Hemoglobin level has improved after transfusion. Continue PPI.  #3 of elevated troponin, likely demand ischemia. Echocardiogram in July 2017 revealed ejection fraction of 40%, diffuse hypokinesis, grade 1 diastolic dysfunction, repeat echocardiogram, unlikely Cardiologic workup due to age, comorbidities #4. CK D stage III, stable #5. Essential hypertension, holding blood pressure medications due to relative hypotension #6. Hypothyroidism, continue outpatient medications  Management plans discussed with the patient, family and they are in agreement.   DRUG ALLERGIES: No Known Allergies  CODE STATUS:     Code Status Orders        Start     Ordered   10/05/16 2221  Do not attempt resuscitation (DNR)  Continuous    Question Answer Comment  In the event of cardiac or respiratory ARREST Do not call a "code blue"   In the event of cardiac or respiratory ARREST Do not perform Intubation, CPR, defibrillation or ACLS   In the event  of cardiac or respiratory ARREST Use medication by any route, position, wound care, and other measures to relive pain and  suffering. May use oxygen, suction and manual treatment of airway obstruction as needed for comfort.      10/05/16 2220    Code Status History    Date Active Date Inactive Code Status Order ID Comments User Context   12/01/2015  9:43 PM 12/03/2015  5:31 PM DNR [id number]  Quintella Baton, MD Inpatient   01/23/2015 10:05 PM 01/29/2015  2:52 PM DNR [id number]  [name], MD Inpatient    Advance Directive Documentation     Most Recent Value  Type of Advance Directive  Healthcare Power of Attorney, Living will  Pre-existing out of facility DNR order (yellow form or pink MOST form)  -  "MOST" Form in Place?  -      TOTAL TIME TAKING CARE OF THIS PATIENT: 40  minutes.   Discussed with power of attorney, patient's niece, all questions were answered, she voiced understanding   Jawaun Celmer M.D on 10/06/2016 at 1:02 PM  Between 7am to 6pm - Pager - [phone]  After 6pm go to [email or web address] - password [email or web address] [facility]  [facility]  Office  [phone]  CC: Primary care physician; Coral Spikes, DO

## 2016-10-06 NOTE — Clinical Social Work Note (Addendum)
CSW received consult that patient is from [facility]. The patient is from the [facility] which is independent living. The CSW will watch for PT recommendations for change in level of care.   PT recommendation is for HHPT. CSW is signing off. Please consult should any needs arise.  Santiago Bumpers, MSW, Latanya Presser [phone]

## 2016-10-06 NOTE — Evaluation (Signed)
Physical Therapy Evaluation Patient Details Name: Ashley [name] MRN: [id number] DOB: 1919/04/02 Today's Date: 10/06/2016   History of Present Illness  Jeananne Bedwell  is a 83 y.o. female with a known history of Aortic atherosclerosis, carotid stenosis, diverticulitis, coronary atherosclerosis, diabetes, hyperlipidemia, hypertension, hypothyroidism- lives in assisted living place with her sister, her niece and nephew are power of attorney and caretaker.  Pt with For last few months she has residual weakness and dizziness episodes and brought to ED and found to have Hgb of 8.0 and was given 1 unit PRBS's.  Clinical Impression  Pt presents to PT with generalized weakness and mild unsteadiness on feet, which appears to be close to baseline.  Pt very responsive to PT instruction and would benefit from additional training while in acute setting and with HHPT after discharge for balance and safety in the home.    Follow Up Recommendations Home health PT    Equipment Recommendations  None recommended by PT (has needed)    Recommendations for Other Services       Precautions / Restrictions Precautions Precautions: Fall Precaution Comments: MOD Risk Restrictions Weight Bearing Restrictions: No      Mobility  Bed Mobility Overal bed mobility: Modified Independent             General bed mobility comments: Supine<>sit with HOB elevated for comfort, moving segmentally, legs then upper body  Transfers Overall transfer level: Needs assistance Equipment used: Rolling walker (2 wheeled) Transfers: Sit to/from Stand Sit to Stand: Min guard         General transfer comment: Initially pt's feet sliding forward when rising to stand and pt using counter to pull self up; on second attempt instructed pt in "nose over toes" technique and pt able to stand without feet sliding.  Ambulation/Gait Ambulation/Gait assistance: Supervision Ambulation Distance (Feet): 180 Feet Assistive device: Rolling  walker (2 wheeled) Gait Pattern/deviations: Step-through pattern     General Gait Details: Decreased cadence with step through gait pattern, directional support given for turns and room location; gait mildly unsteady with no frack balance deviations.  Stairs            Wheelchair Mobility    Modified Rankin (Stroke Patients Only)       Balance Overall balance assessment: Modified Independent                                           Pertinent Vitals/Pain Pain Assessment: No/denies pain    Home Living Family/patient expects to be discharged to:: Private residence Living Arrangements: Other relatives (sister (41 yo)) Available Help at Discharge: Family Type of Home: Independent living facility [facility] [place]) Home Access: Level entry     Home Layout: One level Home Equipment: Environmental consultant - 2 wheels;Shower seat      Prior Function Level of Independence: Independent with assistive device(s)         Comments: used RW for gait tasks; She is mod I for basic ADLs; meals delivered to [facility] from dining room.     Hand Dominance   Dominant Hand: Right    Extremity/Trunk Assessment   Upper Extremity Assessment Upper Extremity Assessment: Overall WFL for tasks assessed    Lower Extremity Assessment Lower Extremity Assessment: Overall WFL for tasks assessed    Cervical / Trunk Assessment Cervical / Trunk Assessment: Kyphotic  Communication   Communication: HOH  Cognition  Arousal/Alertness: Awake/alert Behavior During Therapy: WFL for tasks assessed/performed Overall Cognitive Status: Within Functional Limits for tasks assessed                                 General Comments: very plesant and easy going      General Comments General comments (skin integrity, edema, etc.): bruising to posterior L thigh and inner thigh    Exercises     Assessment/Plan    PT Assessment Patient needs continued PT services  PT  Problem List Decreased strength;Decreased balance;Decreased mobility       PT Treatment Interventions DME instruction;Gait training;Functional mobility training;Therapeutic activities;Therapeutic exercise;Balance training;Patient/family education    PT Goals (Current goals can be found in the Care Plan section)  Acute Rehab PT Goals Patient Stated Goal: "I will do whatever you think I need to do." PT Goal Formulation: With patient Time For Goal Achievement: 10/11/16 Potential to Achieve Goals: Good    Frequency Min 2X/week   Barriers to discharge Decreased caregiver support lives with 81 yo sister    Co-evaluation               AM-PAC PT "6 Clicks" Daily Activity  Outcome Measure Difficulty turning over in bed (including adjusting bedclothes, sheets and blankets)?: None Difficulty moving from lying on back to sitting on the side of the bed? : A Little Difficulty sitting down on and standing up from a chair with arms (e.g., wheelchair, bedside commode, etc,.)?: A Little Help needed moving to and from a bed to chair (including a wheelchair)?: A Little Help needed walking in hospital room?: None Help needed climbing 3-5 steps with a railing? : A Little 6 Click Score: 20    End of Session   Activity Tolerance: Patient tolerated treatment well Patient left: in bed;with family/visitor present Nurse Communication: Mobility status PT Visit Diagnosis: Unsteadiness on feet (R26.81);Muscle weakness (generalized) (M62.81)    Time: 1110-1150 PT Time Calculation (min) (ACUTE ONLY): 40 min   Charges:   PT Evaluation $PT Eval Low Complexity: 1 Procedure PT Treatments $Therapeutic Activity: 8-22 mins   PT G Codes:   PT G-Codes **NOT FOR INPATIENT CLASS** Functional Assessment Tool Used: AM-PAC 6 Clicks Basic Mobility Functional Limitation: Mobility: Walking and moving around Mobility: Walking and Moving Around Current Status (R1594): At least 20 percent but less than 40  percent impaired, limited or restricted Mobility: Walking and Moving Around Goal Status [phone]): At least 1 percent but less than 20 percent impaired, limited or restricted      [employer], PT 10/06/2016, 12:33 PM

## 2016-10-06 NOTE — Care Management Obs Status (Signed)
[place] NOTIFICATION   Patient Details  Name: Ashley [name] MRN: [id number] Date of Birth: 04/12/1919   Medicare Observation Status Notification Given:  Yes [facility] letter given)    Mardene Speak, RN 10/06/2016, 3:41 PM

## 2016-10-07 ENCOUNTER — Observation Stay
Admit: 2016-10-07 | Discharge: 2016-10-07 | Disposition: A | Discharge status: Home / Self Care | Payer: Medicare Other | Attending: Internal Medicine | Admitting: Internal Medicine

## 2016-10-07 DIAGNOSIS — R748 Abnormal levels of other serum enzymes: Secondary | ICD-10-CM | POA: Diagnosis not present

## 2016-10-07 DIAGNOSIS — I10 Essential (primary) hypertension: Secondary | ICD-10-CM | POA: Diagnosis not present

## 2016-10-07 DIAGNOSIS — D509 Iron deficiency anemia, unspecified: Secondary | ICD-10-CM | POA: Diagnosis not present

## 2016-10-07 DIAGNOSIS — R079 Chest pain, unspecified: Secondary | ICD-10-CM | POA: Diagnosis not present

## 2016-10-07 DIAGNOSIS — E039 Hypothyroidism, unspecified: Secondary | ICD-10-CM | POA: Diagnosis not present

## 2016-10-07 DIAGNOSIS — R531 Weakness: Secondary | ICD-10-CM | POA: Diagnosis not present

## 2016-10-07 LAB — CBC
HEMATOCRIT: 28.4 % — AB (ref 35.0–47.0)
Hemoglobin: 9.2 g/dL — ABNORMAL LOW (ref 12.0–16.0)
MCH: 27.3 pg (ref 26.0–34.0)
MCHC: 32.5 g/dL (ref 32.0–36.0)
MCV: 84 fL (ref 80.0–100.0)
Platelets: 144 10*3/uL — ABNORMAL LOW (ref 150–440)
RBC: 3.39 MIL/uL — ABNORMAL LOW (ref 3.80–5.20)
RDW: 18.7 % — AB (ref 11.5–14.5)
WBC: 4.2 10*3/uL (ref 3.6–11.0)

## 2016-10-07 LAB — HEMOGLOBIN: Hemoglobin: 9.4 g/dL — ABNORMAL LOW (ref 12.0–16.0)

## 2016-10-07 MED ORDER — PANTOPRAZOLE SODIUM 40 MG PO TBEC: 40.0000 mg | DELAYED_RELEASE_TABLET | Freq: Every day | ORAL | 3 refills | Status: DC

## 2016-10-07 NOTE — Care Management Note (Signed)
Case Management Note  Patient Details  Name: KATHYE CIPRIANI MRN: [id number] Date of Birth: [birth year]  Subjective/Objective:    Discussed discharge planning with niece Wilder Glade at [phone].   Ms Ovid Curd reports that Ms Thomann can benefit from an Aide. Niece requested [facility] and a referral for HH=PT and Aide was called to Sarah at [facility]. Ms Gibbins already has a RW and a cane per Ms Ovid Curd. No other discharge needs identified.              Action/Plan:   Expected Discharge Date:  10/07/16               Expected Discharge Plan:    10/07/16 In-House Referral:     Discharge planning Services     Post Acute Care Choice:   PT, Aide Choice offered to:   Patient and Neice  DME Arranged:   NA DME Agency:   NA  HH Arranged:   Yes [place] Agency:   [facility].   Status of Service:   Completed.   If discussed at [place] of Stay Meetings, dates discussed:    Additional Comments:  Blessin Kanno A, RN 10/07/2016, 2:01 PM

## 2016-10-07 NOTE — Discharge Summary (Addendum)
[name] NAME: Ashley [name]    MR#:  [id number]  DATE OF BIRTH:  [birth year]  DATE OF ADMISSION:  10/05/2016 ADMITTING PHYSICIAN: Vaughan Basta, MD  DATE OF DISCHARGE: No discharge date for patient encounter.  PRIMARY CARE PHYSICIAN: Coral Spikes, DO     ADMISSION DIAGNOSIS:  Lightheadedness [R42] Symptomatic anemia [D64.9]  DISCHARGE DIAGNOSIS:  Principal Problem:   Symptomatic anemia Active Problems:   Elevated troponin   Thrombocytopenia (HCC)   Generalized weakness   Dizziness   CKD (chronic kidney disease), stage III   SECONDARY DIAGNOSIS:   Past Medical History:  Diagnosis Date  . Aortic atherosclerosis ([place]) 09/04/2016  . Carotid stenosis 12/03/2015   Advanced heavily calcified atherosclerotic disease at both carotid bifurcations. Stenoses measured at 70% affecting the right proximal ICA and 80% affecting the left proximal ICA.  [place] Kitchen Coronary atherosclerosis 09/04/2016  . Diverticulitis   . DM (diabetes mellitus), type 2 ([place]) 09/04/2016  . Gout   . Hyperlipidemia 06/20/2016  . Hypertension   . Hypothyroid   . Osteoarthritis   . Stroke ([place])     .pro HOSPITAL COURSE:   The patient is a 83 year old female with past medical history significant for history of CAD stage III, carotid stenosis, diverticulitis, diabetes, hyperlipidemia, hypertension, hypothyroidism, who presents to the hospital with weakness and dizziness. In emergency room, she was noted to be more anemic, hemoglobin level dropping down by 3 g from 3 months ago. Patient was admitted to the hospital for further evaluation and treatment, she was transfused packed red blood cells after which hemoglobin level improved to 9.2 on the day of discharge. Unfortunately, no iron studies were performed prior to transfusion. Patient admited of using Advil for minor pains. Hemoccult was negative in the emergency room. No black or tarry stools, no  rectal bleeding. No hematemesis was noted. Gastroenterologist consultation was requested, pending recommendations, likely workup as outpatient.  The patient felt good after transfusion. Physical therapist evaluation was done, home health services were recommended. Likely discharge home today Discussion by problem: #1. Generalized weakness and dizziness, etiology is unclear, get orthostatic vital signs, rehydrate if needed, patient was transfused packed red blood cells after which hemoglobin level has improved, she was seen by physical therapist and home health services were recommended only #2. Anemia, hemoccult negative, exact etiology is unknown,, no iron studies were performed prior to transfusion, however,  there is a concern of chronic gastrointestinal blood loss due to patient using nonsteroidal anti-inflammatory medications, patient is to be seen by gastroenterologist, possible outpatient workup, awaiting for recommendations. Hemoglobin level improved after transfusion and remained stable. Continue PPI as outpatient. Hold aspirin and Plavix until recommended to be restarted by PCP or gastroenterologist #3 elevated troponin, likely demand ischemia. Echocardiogram in July 2017 revealed ejection fraction of 40%, diffuse hypokinesis, grade 1 diastolic dysfunction, repeated echocardiogram is pending, likely  follow-up as outpatient, continue Cozaar, holding aspirin and Plavix for about a week due to concerns of gastrointestinal bleed. Will need to have cardiology blessing for gastrointestinal workup #4. CK D stage III, stable #5. Essential hypertension, resume blood pressure medications, watch for hypotension at home #6. Hypothyroidism, continue outpatient medications  DISCHARGE CONDITIONS:   Stable  CONSULTS OBTAINED:  Treatment Team:  Thornton Park, MD  DRUG ALLERGIES:  No Known Allergies  DISCHARGE MEDICATIONS:   Current Discharge Medication List    START taking these medications    Details  pantoprazole (PROTONIX) 40 MG  tablet Take 1 tablet (40 mg total) by mouth daily. Switch for any other PPI at similar dose and frequency Qty: 30 tablet, Refills: 3      CONTINUE these medications which have NOT CHANGED   Details  levothyroxine (SYNTHROID, LEVOTHROID) 100 MCG tablet Take 1 tablet (100 mcg total) by mouth daily. Qty: 45 tablet, Refills: 0    acetaminophen (TYLENOL) 500 MG tablet Take 2 tablets (1,000 mg total) by mouth 3 (three) times daily. Qty: 30 tablet, Refills: 0    atorvastatin (LIPITOR) 20 MG tablet Take 1 tablet (20 mg total) by mouth daily at 6 PM. Qty: 30 tablet, Refills: 0    Cholecalciferol (VITAMIN D3) 400 units CAPS 2 caps daily. Qty: 150 capsule    colchicine 0.6 MG tablet Take 0.6 mg by mouth daily.    losartan (COZAAR) 50 MG tablet Take 50 mg by mouth daily.      STOP taking these medications     clopidogrel (PLAVIX) 75 MG tablet      aspirin EC 81 MG EC tablet          DISCHARGE INSTRUCTIONS:    The patient is to follow-up with primary care physician, cardiologist, gastroneurologist as outpatient  If you experience worsening of your admission symptoms, develop shortness of breath, life threatening emergency, suicidal or homicidal thoughts you must seek medical attention immediately by calling 911 or calling your MD immediately  if symptoms less severe.  You Must read complete instructions/literature along with all the possible adverse reactions/side effects for all the Medicines you take and that have been prescribed to you. Take any new Medicines after you have completely understood and accept all the possible adverse reactions/side effects.   Please note  You were cared for by a hospitalist during your hospital stay. If you have any questions about your discharge medications or the care you received while you were in the hospital after you are discharged, you can call the unit and asked to speak with the hospitalist on call if  the hospitalist that took care of you is not available. Once you are discharged, your primary care physician will handle any further medical issues. Please note that NO REFILLS for any discharge medications will be authorized once you are discharged, as it is imperative that you return to your primary care physician (or establish a relationship with a primary care physician if you do not have one) for your aftercare needs so that they can reassess your need for medications and monitor your lab values.    Today   CHIEF COMPLAINT:   Chief Complaint  Patient presents with  . Headache    HISTORY OF PRESENT ILLNESS:    VITAL SIGNS:  Blood pressure (!) 152/50, pulse (!) 27, temperature 97.8 F (36.6 C), temperature source Oral, resp. rate 18, height 5\' 6"  (1.676 m), weight 63.5 kg (140 lb), SpO2 99 %.  I/O:    Intake/Output Summary (Last 24 hours) at 10/07/16 1400 Last data filed at 10/07/16 1209  Gross per 24 hour  Intake              300 ml  Output              850 ml  Net             -550 ml    PHYSICAL EXAMINATION:  GENERAL:  83 y.o.-year-old patient lying in the bed with no acute distress.  EYES: Pupils equal, round, reactive to light and accommodation. No  scleral icterus. Extraocular muscles intact.  HEENT: Head atraumatic, normocephalic. Oropharynx and nasopharynx clear.  NECK:  Supple, no jugular venous distention. No thyroid enlargement, no tenderness.  LUNGS: Normal breath sounds bilaterally, no wheezing, rales,rhonchi or crepitation. No use of accessory muscles of respiration.  CARDIOVASCULAR: S1, S2 normal. No murmurs, rubs, or gallops.  ABDOMEN: Soft, non-tender, non-distended. Bowel sounds present. No organomegaly or mass.  EXTREMITIES: No pedal edema, cyanosis, or clubbing.  NEUROLOGIC: Cranial nerves II through XII are intact. Muscle strength 5/5 in all extremities. Sensation intact. Gait not checked.  PSYCHIATRIC: The patient is alert and oriented x 3.  SKIN: No  obvious rash, lesion, or ulcer.   DATA REVIEW:   CBC  Recent Labs Lab 10/07/16 0349 10/07/16 1120  WBC 4.2  --   HGB 9.2* 9.4*  HCT 28.4*  --   PLT 144*  --     Chemistries   Recent Labs Lab 10/05/16 1328 10/06/16 0406  NA 138 139  K 4.4 4.2  CL 106 107  CO2 24 26  GLUCOSE 104* 109*  BUN 29* 28*  CREATININE 1.11* 1.08*  CALCIUM 9.2 8.4*  AST 30  --   ALT 11*  --   ALKPHOS 84  --   BILITOT 1.5*  --     Cardiac Enzymes  Recent Labs Lab 10/06/16 0406  TROPONINI 0.04*    Microbiology Results  Results for orders placed or performed during the hospital encounter of 08/29/14  Urine culture     Status: None   Collection Time: 08/29/14  3:55 PM  Result Value Ref Range Status   Micro Text Report   Final       SOURCE: CLEAN CATCH    ORGANISM 1                >100,000 CFU/ML Escherichia coli   ANTIBIOTIC                    ORG#1     AMPICILLIN                    R         CEFAZOLIN                     S         CEFOXITIN                     S         CEFTRIAXONE                   S         CIPROFLOXACIN                 S         GENTAMICIN                    S         IMIPENEM                      S         LEVOFLOXACIN                  S         NITROFURANTOIN                S         Trimethoprim/Sulfamethoxazole S  RADIOLOGY:  No results found.  EKG:   Orders placed or performed during the hospital encounter of 10/05/16  . ED EKG  . ED EKG      Management plans discussed with the patient, family and they are in agreement.  CODE STATUS:     Code Status Orders        Start     Ordered   10/05/16 2221  Do not attempt resuscitation (DNR)  Continuous    Question Answer Comment  In the event of cardiac or respiratory ARREST Do not call a "code blue"   In the event of cardiac or respiratory ARREST Do not perform Intubation, CPR, defibrillation or ACLS   In the event of cardiac or respiratory ARREST Use medication by any route,  position, wound care, and other measures to relive pain and suffering. May use oxygen, suction and manual treatment of airway obstruction as needed for comfort.      10/05/16 2220    Code Status History    Date Active Date Inactive Code Status Order ID Comments User Context   12/01/2015  9:43 PM 12/03/2015  5:31 PM DNR [id number]  Quintella Baton, MD Inpatient   01/23/2015 10:05 PM 01/29/2015  2:52 PM DNR [id number]  [name], MD Inpatient    Advance Directive Documentation     Most Recent Value  Type of Advance Directive  Healthcare Power of Attorney, Living will  Pre-existing out of facility DNR order (yellow form or pink MOST form)  -  "MOST" Form in Place?  -      TOTAL TIME TAKING CARE OF THIS PATIENT: 40 minutes.    Theodoro Grist M.D on 10/07/2016 at 2:00 PM  Between 7am to 6pm - Pager - [phone]  After 6pm go to [email or web address] - password [email or web address] [email or web address]  [facility]  Office  [phone]  CC: Primary care physician; Coral Spikes, DO

## 2016-10-08 ENCOUNTER — Telehealth: Payer: Self-pay | Admitting: Family Medicine

## 2016-10-08 DIAGNOSIS — D649 Anemia, unspecified: Secondary | ICD-10-CM | POA: Diagnosis not present

## 2016-10-08 DIAGNOSIS — E1122 Type 2 diabetes mellitus with diabetic chronic kidney disease: Secondary | ICD-10-CM | POA: Diagnosis not present

## 2016-10-08 DIAGNOSIS — N183 Chronic kidney disease, stage 3 (moderate): Secondary | ICD-10-CM | POA: Diagnosis not present

## 2016-10-08 DIAGNOSIS — M1990 Unspecified osteoarthritis, unspecified site: Secondary | ICD-10-CM | POA: Diagnosis not present

## 2016-10-08 DIAGNOSIS — I129 Hypertensive chronic kidney disease with stage 1 through stage 4 chronic kidney disease, or unspecified chronic kidney disease: Secondary | ICD-10-CM | POA: Diagnosis not present

## 2016-10-08 DIAGNOSIS — M6281 Muscle weakness (generalized): Secondary | ICD-10-CM | POA: Diagnosis not present

## 2016-10-08 LAB — TYPE AND SCREEN
ABO/RH(D): B POS
ANTIBODY SCREEN: NEGATIVE
DONOR AG TYPE: NEGATIVE
PT AG TYPE: NEGATIVE
UNIT DIVISION: 0
UNIT DIVISION: 0
Unit division: 0

## 2016-10-08 LAB — BPAM RBC
Blood Product Expiration Date: 201806012359
Blood Product Expiration Date: 201806052359
Blood Product Expiration Date: 201806052359
ISSUE DATE / TIME: 201805112350
UNIT TYPE AND RH: 5100
UNIT TYPE AND RH: 5100
Unit Type and Rh: 5100

## 2016-10-08 LAB — ECHOCARDIOGRAM COMPLETE
Height: 66 in
Weight: 2240 oz

## 2016-10-08 LAB — PREPARE RBC (CROSSMATCH)

## 2016-10-08 NOTE — Telephone Encounter (Signed)
Will discuss at hospital follow up.

## 2016-10-08 NOTE — Telephone Encounter (Signed)
Pt nephew came in and stated that pt was just in the hospital. He wants to know if there is anyway that Dr. Lacinda Axon could try and convince her that it is time to go to an Assisted Living. Please advise, thank you!  Call David @ [phone]

## 2016-10-08 NOTE — Telephone Encounter (Signed)
Nephew called and advised this will be a conversation for HFU

## 2016-10-08 NOTE — Telephone Encounter (Signed)
Transition Care Management Follow-up Telephone Call  How have you been since you were released from the hospital? Patient reported still weak by DPR.   Do you understand why you were in the hospital? Patient    Do you understand the discharge instrcutions? Yes,  Items Reviewed:  Medications reviewed: Yes, DC Plavix and ASA added protonix  Allergies reviewed: Yes  Dietary changes reviewed: Yes  Referrals reviewed: yes    Functional Questionnaire:   Activities of Daily Living (ADLs):   She states they are independent in the following: Patient takes sponge baths cannot get in shower or bathtub. Family stated patient cannot hear and that they are afraid for her to be alone. States they require assistance with the following: Walking , bathing hearing and meals per family.   Any transportation issues/concerns?: Yes, patient has to work it out between family or facility.   Any patient concerns? Yes, Patient DPR stated she feels patient should not be left alone.   Confirmed importance and date/time of follow-up visits scheduled: yes   Confirmed with patient if condition begins to worsen call PCP or go to the ER.  Patient was given the Call-a-Nurse line [phone]: Yes.

## 2016-10-10 ENCOUNTER — Other Ambulatory Visit: Payer: Self-pay | Admitting: Family Medicine

## 2016-10-10 ENCOUNTER — Encounter: Payer: Self-pay | Admitting: Family Medicine

## 2016-10-10 ENCOUNTER — Ambulatory Visit (INDEPENDENT_AMBULATORY_CARE_PROVIDER_SITE_OTHER): Payer: Medicare Other | Admitting: Family Medicine

## 2016-10-10 VITALS — BP 152/72 | HR 30 | Temp 98.0°F | Wt 130.2 lb

## 2016-10-10 DIAGNOSIS — D649 Anemia, unspecified: Secondary | ICD-10-CM

## 2016-10-10 DIAGNOSIS — E039 Hypothyroidism, unspecified: Secondary | ICD-10-CM | POA: Diagnosis not present

## 2016-10-10 LAB — CBC
HCT: 35.9 % — ABNORMAL LOW (ref 36.0–46.0)
Hemoglobin: 11.3 g/dL — ABNORMAL LOW (ref 12.0–15.0)
MCHC: 31.4 g/dL (ref 30.0–36.0)
MCV: 86.7 fl (ref 78.0–100.0)
Platelets: 238 10*3/uL (ref 150.0–400.0)
RBC: 4.14 Mil/uL (ref 3.87–5.11)
RDW: 19.6 % — AB (ref 11.5–15.5)
WBC: 6.4 10*3/uL (ref 4.0–10.5)

## 2016-10-10 LAB — IRON,TIBC AND FERRITIN PANEL
%SAT: 31 % (ref 11–50)
Ferritin: 92 ng/mL (ref 20–288)
IRON: 115 ug/dL (ref 45–160)
TIBC: 375 ug/dL (ref 250–450)

## 2016-10-10 LAB — TSH: TSH: 0.06 u[IU]/mL — AB (ref 0.35–4.50)

## 2016-10-10 MED ORDER — LEVOTHYROXINE SODIUM 75 MCG PO TABS: 75.0000 ug | ORAL_TABLET | Freq: Every day | ORAL | 0 refills | Status: DC

## 2016-10-10 NOTE — Progress Notes (Signed)
Subjective:  Patient ID: Ashley [name], female    DOB: 12-25-1918  Age: 83 y.o. MRN: [id number]  CC: Hospital follow up  HPI:  83 year old female with an extensive PMH presents for hospital followup.  Patient presented to the ED on 5/11 at the insistence of her family. She had been complaining have some dizziness/balance issues and was noted to have DOE. Work up revealed a dramatic decline in Hb (in 1 month had dropped from 12 to 8). She was then admitted for symptomatic anemia.   She was transfused 1 unit. Hemoglobin remained stable. Hemoccult negative. Aspirin and Plavix held and discontinued at discharged. Suspected that this may have been due to OTC Advil use. Troponin elevated during admission (secondary to demand ischemia). No cardiac work up done given advanced age. She was discharged home in stable condition.   She presents today for follow up. She essentially has no complaints. Her niece and nephew state that she is feeling the same. No improvement. Still unsteady on her feet. Appears to be having dyspnea with exertion. Intermittent headaches. They are very concerned and adamant for further work up. Also, they are pushing for her to be in Assisted living which she does not desire.  Social Hx   Social History   Social History  . Marital status: Widowed    Spouse name: N/A  . Number of children: N/A  . Years of education: N/A   Social History Main Topics  . Smoking status: Never Smoker  . Smokeless tobacco: Never Used  . Alcohol use No  . Drug use: No  . Sexual activity: Not Currently   Other Topics Concern  . None   Social History Narrative  . None    Review of Systems  Respiratory: Positive for shortness of breath.   Musculoskeletal: Positive for gait problem.  Neurological: Positive for headaches.   Objective:  BP (!) 152/72   Pulse (!) 30   Temp 98 F (36.7 C) (Oral)   Wt 130 lb 4 oz (59.1 kg)   SpO2 98%   BMI 21.02 kg/m   BP/Weight 10/10/2016  10/07/2016 1/91/4782  Systolic BP 956 213 -  Diastolic BP 72 50 -  Wt. (Lbs) 130.25 - 140  BMI 21.02 - 22.6    Physical Exam  Constitutional: She appears well-developed. No distress.  Cardiovascular:  Regularly, irregular (due to ectopy).  Pulmonary/Chest: Effort normal. She has no wheezes. She has no rales.  Neurological: She is alert.  No apparent focal neuro deficits.  Psychiatric: She has a normal mood and affect.  In good spirits.  Vitals reviewed.   Lab Results  Component Value Date   WBC 6.4 10/10/2016   HGB 11.3 (L) 10/10/2016   HCT 35.9 (L) 10/10/2016   PLT 238.0 10/10/2016   GLUCOSE 109 (H) 10/06/2016   CHOL 141 09/04/2016   TRIG 208.0 (H) 09/04/2016   HDL 50.40 09/04/2016   LDLDIRECT 58.0 09/04/2016   LDLCALC 119 (H) 12/02/2015   ALT 11 (L) 10/05/2016   AST 30 10/05/2016   NA 139 10/06/2016   K 4.2 10/06/2016   CL 107 10/06/2016   CREATININE 1.08 (H) 10/06/2016   BUN 28 (H) 10/06/2016   CO2 26 10/06/2016   TSH 0.06 (L) 10/10/2016   INR 1.12 01/23/2015   HGBA1C 6.7 (H) 09/04/2016    Assessment & Plan:   Problem List Items Addressed This Visit    Symptomatic anemia - Primary    New problem. Uncertain etiology. However,  likely from blood loss. Labs today for re-evaluation. Discussed work up vs close monitoring given advanced age. Family would like me to discuss with GI. Family adamant for patient to enter ALF. We had a lengthy discussion about this. Patient wants to stay home and is capable of making her own decisions at this point in time. Family will discuss with patient.        Relevant Orders   CBC (Completed)   Iron, TIBC and Ferritin Panel (Completed)   Hypothyroidism   Relevant Orders   TSH (Completed)      Follow-up: Has follow up later this month.  30 minutes were spent face-to-face with the patient during this encounter and over half of that time was spent on counseling regarding hospitalization/hospital course, anemia work up (labs,  possible endoscopy/colonoscopy and what that entails), pros and cons of assisted living placement.  [place]

## 2016-10-10 NOTE — Patient Instructions (Signed)
I will call with the results.  Take care  Dr. Clio Gerhart  

## 2016-10-10 NOTE — Assessment & Plan Note (Signed)
New problem. Uncertain etiology. However, likely from blood loss. Labs today for re-evaluation. Discussed work up vs close monitoring given advanced age. Family would like me to discuss with GI. Family adamant for patient to enter ALF. We had a lengthy discussion about this. Patient wants to stay home and is capable of making her own decisions at this point in time. Family will discuss with patient.

## 2016-10-12 ENCOUNTER — Telehealth: Payer: Self-pay

## 2016-10-12 NOTE — Telephone Encounter (Signed)
-----   Message from Coral Spikes, DO sent at 10/12/2016  7:11 AM EDT ----- GI would like to see her to discuss. This ok? I called [facility] and couldn't get anyone to pick up.  [place]

## 2016-10-12 NOTE — Telephone Encounter (Signed)
Per DPR pt nephew was called and given Dr.Cook's recommendations. He states that G.I. Is okay but if there is anything that will be invasive pt has refused.

## 2016-10-14 ENCOUNTER — Other Ambulatory Visit: Payer: Self-pay | Admitting: Family Medicine

## 2016-10-14 DIAGNOSIS — D649 Anemia, unspecified: Secondary | ICD-10-CM

## 2016-10-15 DIAGNOSIS — N183 Chronic kidney disease, stage 3 (moderate): Secondary | ICD-10-CM | POA: Diagnosis not present

## 2016-10-15 DIAGNOSIS — I129 Hypertensive chronic kidney disease with stage 1 through stage 4 chronic kidney disease, or unspecified chronic kidney disease: Secondary | ICD-10-CM | POA: Diagnosis not present

## 2016-10-15 DIAGNOSIS — M1990 Unspecified osteoarthritis, unspecified site: Secondary | ICD-10-CM | POA: Diagnosis not present

## 2016-10-15 DIAGNOSIS — E1122 Type 2 diabetes mellitus with diabetic chronic kidney disease: Secondary | ICD-10-CM | POA: Diagnosis not present

## 2016-10-15 DIAGNOSIS — D649 Anemia, unspecified: Secondary | ICD-10-CM | POA: Diagnosis not present

## 2016-10-15 DIAGNOSIS — M6281 Muscle weakness (generalized): Secondary | ICD-10-CM | POA: Diagnosis not present

## 2016-10-15 NOTE — Telephone Encounter (Signed)
Noted thanks °

## 2016-10-15 NOTE — Telephone Encounter (Signed)
Discussed with GI and informed Melissa.

## 2016-10-15 NOTE — Telephone Encounter (Signed)
Pt's nephew came into office this afternoon. Pt has decided not to go to [facility], unless Dr. Lacinda Axon highly recommends she goes.

## 2016-10-15 NOTE — Telephone Encounter (Signed)
FYI, Please advise, thanks

## 2016-10-17 ENCOUNTER — Other Ambulatory Visit: Payer: Medicare Other

## 2016-10-18 DIAGNOSIS — N183 Chronic kidney disease, stage 3 (moderate): Secondary | ICD-10-CM | POA: Diagnosis not present

## 2016-10-18 DIAGNOSIS — M6281 Muscle weakness (generalized): Secondary | ICD-10-CM | POA: Diagnosis not present

## 2016-10-18 DIAGNOSIS — I129 Hypertensive chronic kidney disease with stage 1 through stage 4 chronic kidney disease, or unspecified chronic kidney disease: Secondary | ICD-10-CM | POA: Diagnosis not present

## 2016-10-18 DIAGNOSIS — D649 Anemia, unspecified: Secondary | ICD-10-CM | POA: Diagnosis not present

## 2016-10-18 DIAGNOSIS — E1122 Type 2 diabetes mellitus with diabetic chronic kidney disease: Secondary | ICD-10-CM | POA: Diagnosis not present

## 2016-10-18 DIAGNOSIS — M1990 Unspecified osteoarthritis, unspecified site: Secondary | ICD-10-CM | POA: Diagnosis not present

## 2016-10-23 DIAGNOSIS — N183 Chronic kidney disease, stage 3 (moderate): Secondary | ICD-10-CM | POA: Diagnosis not present

## 2016-10-23 DIAGNOSIS — E1122 Type 2 diabetes mellitus with diabetic chronic kidney disease: Secondary | ICD-10-CM | POA: Diagnosis not present

## 2016-10-23 DIAGNOSIS — D649 Anemia, unspecified: Secondary | ICD-10-CM | POA: Diagnosis not present

## 2016-10-23 DIAGNOSIS — M6281 Muscle weakness (generalized): Secondary | ICD-10-CM | POA: Diagnosis not present

## 2016-10-23 DIAGNOSIS — M1990 Unspecified osteoarthritis, unspecified site: Secondary | ICD-10-CM | POA: Diagnosis not present

## 2016-10-23 DIAGNOSIS — I129 Hypertensive chronic kidney disease with stage 1 through stage 4 chronic kidney disease, or unspecified chronic kidney disease: Secondary | ICD-10-CM | POA: Diagnosis not present

## 2016-10-25 ENCOUNTER — Ambulatory Visit (INDEPENDENT_AMBULATORY_CARE_PROVIDER_SITE_OTHER): Payer: Medicare Other | Admitting: Family Medicine

## 2016-10-25 VITALS — BP 150/82 | HR 58 | Temp 97.7°F | Resp 18 | Wt 128.2 lb

## 2016-10-25 DIAGNOSIS — D649 Anemia, unspecified: Secondary | ICD-10-CM | POA: Diagnosis not present

## 2016-10-25 DIAGNOSIS — T148XXA Other injury of unspecified body region, initial encounter: Secondary | ICD-10-CM | POA: Diagnosis not present

## 2016-10-25 LAB — CBC WITH DIFFERENTIAL/PLATELET
BASOS PCT: 2.8 % (ref 0.0–3.0)
Basophils Absolute: 0.2 10*3/uL — ABNORMAL HIGH (ref 0.0–0.1)
EOS PCT: 1.7 % (ref 0.0–5.0)
Eosinophils Absolute: 0.1 10*3/uL (ref 0.0–0.7)
HCT: 37.4 % (ref 36.0–46.0)
Hemoglobin: 12 g/dL (ref 12.0–15.0)
Lymphocytes Relative: 21.8 % (ref 12.0–46.0)
Lymphs Abs: 1.2 10*3/uL (ref 0.7–4.0)
MCHC: 32 g/dL (ref 30.0–36.0)
MCV: 88.7 fl (ref 78.0–100.0)
MONO ABS: 0.9 10*3/uL (ref 0.1–1.0)
Monocytes Relative: 15.6 % — ABNORMAL HIGH (ref 3.0–12.0)
NEUTROS PCT: 58.1 % (ref 43.0–77.0)
Neutro Abs: 3.2 10*3/uL (ref 1.4–7.7)
Platelets: 217 10*3/uL (ref 150.0–400.0)
RBC: 4.22 Mil/uL (ref 3.87–5.11)
RDW: 21.9 % — AB (ref 11.5–15.5)
WBC: 5.5 10*3/uL (ref 4.0–10.5)

## 2016-10-25 NOTE — Assessment & Plan Note (Signed)
New problem. Unclear etiology at this time. Patient has extensive bruising of the left leg. She is not aware of any fall or injury.  Obtaining CBC with diff today. We'll continue to monitor.

## 2016-10-25 NOTE — Patient Instructions (Signed)
Follow up in 1 month.  Continue your medications.  Take care  Dr. Lacinda Axon

## 2016-10-25 NOTE — Assessment & Plan Note (Signed)
Improving. Patient and family do not want to proceed with further invasive workup at this time. We'll continue to monitor. CBC today.

## 2016-10-25 NOTE — Progress Notes (Signed)
Subjective:  Patient ID: Ashley [name], female    DOB: Apr 14, 1919  Age: 83 y.o. MRN: [id number]  CC: Follow up  HPI:  83 year old female with an extensive past medical history presents for follow-up regarding anemia.  Patient recently in admitted and found to have new onset anemia with a drop in hemoglobin. She was transfused. Follow-up hemoglobins since discharge have been improving. Last hemoglobin was 11.3. At her last visit we discussed her seeing GI and this was arranged. She and her family canceled the appointment.   Patient resents today for follow-up. She only has one complaint. She states that she's had some bruising of her left leg. She is unsure of how this happened. No reported fall. She states that she otherwise feels well. Her family members are quite concerned and are adamant that she should go to assisted living. Patient refuses. Hemoglobin has been trending up as indicated above. Patient seems to be doing well at this time. No other complaints or concerns today.  Social Hx   Social History   Social History  . Marital status: Widowed    Spouse name: N/A  . Number of children: N/A  . Years of education: N/A   Social History Main Topics  . Smoking status: Never Smoker  . Smokeless tobacco: Never Used  . Alcohol use No  . Drug use: No  . Sexual activity: Not Currently   Other Topics Concern  . Not on file   Social History Narrative  . No narrative on file    Review of Systems  Constitutional: Negative.   Skin:       Bruising.   Objective:  BP (!) 150/82 (BP Location: Left Arm, Patient Position: Sitting, Cuff Size: Normal)   Pulse (!) 58   Temp 97.7 F (36.5 C) (Oral)   Resp 18   Wt 128 lb 4 oz (58.2 kg)   SpO2 98%   BMI 20.70 kg/m   BP/Weight 10/25/2016 10/10/2016 3/81/0175  Systolic BP 102 585 277  Diastolic BP 82 72 50  Wt. (Lbs) 128.25 130.25 -  BMI 20.7 21.02 -   Physical Exam  Constitutional: She appears well-developed. No distress.    Pulmonary/Chest: Effort normal. She has no wheezes. She has no rales.  Neurological: She is alert.  Skin:  Left leg with extensive bruising noted of the thigh and extends down into the lower leg. In various stages of healing.   Psychiatric: She has a normal mood and affect.  Vitals reviewed.   Lab Results  Component Value Date   WBC 6.4 10/10/2016   HGB 11.3 (L) 10/10/2016   HCT 35.9 (L) 10/10/2016   PLT 238.0 10/10/2016   GLUCOSE 109 (H) 10/06/2016   CHOL 141 09/04/2016   TRIG 208.0 (H) 09/04/2016   HDL 50.40 09/04/2016   LDLDIRECT 58.0 09/04/2016   LDLCALC 119 (H) 12/02/2015   ALT 11 (L) 10/05/2016   AST 30 10/05/2016   NA 139 10/06/2016   K 4.2 10/06/2016   CL 107 10/06/2016   CREATININE 1.08 (H) 10/06/2016   BUN 28 (H) 10/06/2016   CO2 26 10/06/2016   TSH 0.06 (L) 10/10/2016   INR 1.12 01/23/2015   HGBA1C 6.7 (H) 09/04/2016    Assessment & Plan:   Problem List Items Addressed This Visit      Other   Bruising    New problem. Unclear etiology at this time. Patient has extensive bruising of the left leg. She is not aware of any fall or  injury.  Obtaining CBC with diff today. We'll continue to monitor.       Anemia - Primary    Improving. Patient and family do not want to proceed with further invasive workup at this time. We'll continue to monitor. CBC today.      Relevant Orders   CBC w/Diff     Follow-up: 1 month  [place] DO [facility]

## 2016-10-26 DIAGNOSIS — M6281 Muscle weakness (generalized): Secondary | ICD-10-CM | POA: Diagnosis not present

## 2016-10-26 DIAGNOSIS — M1990 Unspecified osteoarthritis, unspecified site: Secondary | ICD-10-CM | POA: Diagnosis not present

## 2016-10-26 DIAGNOSIS — D649 Anemia, unspecified: Secondary | ICD-10-CM | POA: Diagnosis not present

## 2016-10-26 DIAGNOSIS — I129 Hypertensive chronic kidney disease with stage 1 through stage 4 chronic kidney disease, or unspecified chronic kidney disease: Secondary | ICD-10-CM | POA: Diagnosis not present

## 2016-10-26 DIAGNOSIS — E1122 Type 2 diabetes mellitus with diabetic chronic kidney disease: Secondary | ICD-10-CM | POA: Diagnosis not present

## 2016-10-26 DIAGNOSIS — N183 Chronic kidney disease, stage 3 (moderate): Secondary | ICD-10-CM | POA: Diagnosis not present

## 2016-10-30 DIAGNOSIS — N183 Chronic kidney disease, stage 3 (moderate): Secondary | ICD-10-CM | POA: Diagnosis not present

## 2016-10-30 DIAGNOSIS — E1122 Type 2 diabetes mellitus with diabetic chronic kidney disease: Secondary | ICD-10-CM | POA: Diagnosis not present

## 2016-10-30 DIAGNOSIS — M6281 Muscle weakness (generalized): Secondary | ICD-10-CM | POA: Diagnosis not present

## 2016-10-30 DIAGNOSIS — I129 Hypertensive chronic kidney disease with stage 1 through stage 4 chronic kidney disease, or unspecified chronic kidney disease: Secondary | ICD-10-CM | POA: Diagnosis not present

## 2016-10-30 DIAGNOSIS — D649 Anemia, unspecified: Secondary | ICD-10-CM | POA: Diagnosis not present

## 2016-10-30 DIAGNOSIS — M1990 Unspecified osteoarthritis, unspecified site: Secondary | ICD-10-CM | POA: Diagnosis not present

## 2016-10-31 DIAGNOSIS — D649 Anemia, unspecified: Secondary | ICD-10-CM | POA: Diagnosis not present

## 2016-10-31 DIAGNOSIS — M1990 Unspecified osteoarthritis, unspecified site: Secondary | ICD-10-CM | POA: Diagnosis not present

## 2016-10-31 DIAGNOSIS — N183 Chronic kidney disease, stage 3 (moderate): Secondary | ICD-10-CM | POA: Diagnosis not present

## 2016-10-31 DIAGNOSIS — I129 Hypertensive chronic kidney disease with stage 1 through stage 4 chronic kidney disease, or unspecified chronic kidney disease: Secondary | ICD-10-CM | POA: Diagnosis not present

## 2016-10-31 DIAGNOSIS — M6281 Muscle weakness (generalized): Secondary | ICD-10-CM | POA: Diagnosis not present

## 2016-10-31 DIAGNOSIS — E1122 Type 2 diabetes mellitus with diabetic chronic kidney disease: Secondary | ICD-10-CM | POA: Diagnosis not present

## 2016-11-05 DIAGNOSIS — I129 Hypertensive chronic kidney disease with stage 1 through stage 4 chronic kidney disease, or unspecified chronic kidney disease: Secondary | ICD-10-CM | POA: Diagnosis not present

## 2016-11-05 DIAGNOSIS — D649 Anemia, unspecified: Secondary | ICD-10-CM | POA: Diagnosis not present

## 2016-11-05 DIAGNOSIS — M1990 Unspecified osteoarthritis, unspecified site: Secondary | ICD-10-CM | POA: Diagnosis not present

## 2016-11-05 DIAGNOSIS — N183 Chronic kidney disease, stage 3 (moderate): Secondary | ICD-10-CM | POA: Diagnosis not present

## 2016-11-05 DIAGNOSIS — E1122 Type 2 diabetes mellitus with diabetic chronic kidney disease: Secondary | ICD-10-CM | POA: Diagnosis not present

## 2016-11-05 DIAGNOSIS — M6281 Muscle weakness (generalized): Secondary | ICD-10-CM | POA: Diagnosis not present

## 2016-11-06 DIAGNOSIS — I129 Hypertensive chronic kidney disease with stage 1 through stage 4 chronic kidney disease, or unspecified chronic kidney disease: Secondary | ICD-10-CM | POA: Diagnosis not present

## 2016-11-06 DIAGNOSIS — D649 Anemia, unspecified: Secondary | ICD-10-CM | POA: Diagnosis not present

## 2016-11-06 DIAGNOSIS — N183 Chronic kidney disease, stage 3 (moderate): Secondary | ICD-10-CM | POA: Diagnosis not present

## 2016-11-06 DIAGNOSIS — M6281 Muscle weakness (generalized): Secondary | ICD-10-CM | POA: Diagnosis not present

## 2016-11-06 DIAGNOSIS — M1990 Unspecified osteoarthritis, unspecified site: Secondary | ICD-10-CM | POA: Diagnosis not present

## 2016-11-06 DIAGNOSIS — E1122 Type 2 diabetes mellitus with diabetic chronic kidney disease: Secondary | ICD-10-CM | POA: Diagnosis not present

## 2016-11-13 ENCOUNTER — Telehealth: Payer: Self-pay | Admitting: Family Medicine

## 2016-11-13 NOTE — Telephone Encounter (Signed)
Filled out by Dr. Lacinda Axon gave to [place] to put at reception

## 2016-11-13 NOTE — Telephone Encounter (Signed)
Hand delivered to CMA to get completed, thanks

## 2016-11-13 NOTE — Telephone Encounter (Signed)
FL2 and Admission orders have been dropped off by [facility]. Papers up front in Dr. Jonathon Jordan color folder.

## 2016-11-16 ENCOUNTER — Encounter
Admission: RE | Admit: 2016-11-16 | Discharge: 2016-11-16 | Disposition: A | Discharge status: Home / Self Care | Payer: Medicare Other | Source: Ambulatory Visit | Attending: Internal Medicine | Admitting: Internal Medicine

## 2016-11-21 DIAGNOSIS — I10 Essential (primary) hypertension: Secondary | ICD-10-CM | POA: Diagnosis not present

## 2016-11-21 DIAGNOSIS — E034 Atrophy of thyroid (acquired): Secondary | ICD-10-CM | POA: Diagnosis not present

## 2016-11-21 DIAGNOSIS — M1991 Primary osteoarthritis, unspecified site: Secondary | ICD-10-CM

## 2016-11-21 DIAGNOSIS — E039 Hypothyroidism, unspecified: Secondary | ICD-10-CM

## 2016-11-21 DIAGNOSIS — M109 Gout, unspecified: Secondary | ICD-10-CM | POA: Diagnosis not present

## 2016-11-21 DIAGNOSIS — M150 Primary generalized (osteo)arthritis: Secondary | ICD-10-CM | POA: Diagnosis not present

## 2016-11-21 DIAGNOSIS — I779 Disorder of arteries and arterioles, unspecified: Secondary | ICD-10-CM

## 2016-11-21 HISTORY — DX: Primary osteoarthritis, unspecified site: M19.91

## 2016-11-21 HISTORY — DX: Hypothyroidism, unspecified: E03.9

## 2016-11-21 HISTORY — DX: Disorder of arteries and arterioles, unspecified: I77.9

## 2016-11-21 HISTORY — DX: Essential (primary) hypertension: I10

## 2016-11-21 HISTORY — DX: Gout, unspecified: M10.9

## 2016-11-23 ENCOUNTER — Non-Acute Institutional Stay: Payer: Medicare Other | Admitting: Gerontology

## 2016-11-23 ENCOUNTER — Encounter: Payer: Self-pay | Admitting: Gerontology

## 2016-11-23 DIAGNOSIS — S81812A Laceration without foreign body, left lower leg, initial encounter: Secondary | ICD-10-CM

## 2016-11-23 NOTE — Progress Notes (Signed)
Location:   [facility] [place] [facility] Room Number: [phone] Place of Service:  SNF [phone]) Provider:  Toni Arthurs, NP-C  Coral Spikes, DO  Patient Care Team: Coral Spikes DO as PCP - General [facility])  Extended Emergency Contact Information Primary Emergency Contact: [place] of [place] Phone: [phone] Relation: Niece Secondary Emergency Contact: Charolotte Eke [place] of [place] Phone: [phone] Relation: Nephew  Code Status:  Full  Goals of care: Advanced Directive information Advanced Directives 11/23/2016  Does Patient Have a Medical Advance Directive? No  Type of Advance Directive -  Does patient want to make changes to medical advance directive? -  Copy of [place] in Chart? -     Chief Complaint  Patient presents with  . Acute Visit    Acute    HPI:  Pt is a 83 y.o. female seen today for an acute visit for skin teras of the left lower leg. Pt reports she bumped into a chair last week, causing the open areas to the leg. The leg is red and swollen. It is not warm to touch. Wounds have minimal drainage, but are moist appearing. Pt is afebrile. Pain is minimal, not having any difficulty weight bearing because of the wounds. VSS. No other complaints.    Past Medical History:  Diagnosis Date  . Acute gouty arthritis 11/21/2016  . Aortic atherosclerosis ([place]) 09/04/2016  . Carotid artery disease ([place]) 11/21/2016  . Carotid stenosis 12/03/2015   Advanced heavily calcified atherosclerotic disease at both carotid bifurcations. Stenoses measured at 70% affecting the right proximal ICA and 80% affecting the left proximal ICA.  Marland Kitchen Coronary atherosclerosis 09/04/2016  . Diverticulitis   . DM (diabetes mellitus), type 2 ([place]) 09/04/2016  . Gout   . HTN, goal below 140/90 11/21/2016  . Hyperlipidemia 06/20/2016  . Hypothyroidism, unspecified 11/21/2016  . Primary osteoarthritis 11/21/2016   involving multiple joints  . Stroke [facility])    Past Surgical History:  Procedure Laterality Date  . CATARACT EXTRACTION    . Finger crystal removal     right 3rd  . toe crystal removal     right 1st toe    No Known Allergies  Allergies as of 11/23/2016   No Known Allergies     Medication List       Accurate as of 11/23/16  3:26 PM. Always use your most recent med list.          atorvastatin 20 MG tablet Commonly known as:  LIPITOR Take 1 tablet (20 mg total) by mouth daily at 6 PM.   cholecalciferol 400 units Tabs tablet Commonly known as:  VITAMIN D Take 400 Units by mouth daily. 8 am   colchicine 0.6 MG tablet Take 0.6 mg by mouth daily.   levothyroxine 75 MCG tablet Commonly known as:  SYNTHROID, LEVOTHROID Take 1 tablet (75 mcg total) by mouth daily.   losartan 50 MG tablet Commonly known as:  COZAAR Take 50 mg by mouth daily.   MAPAP ARTHRITIS PAIN 650 MG CR tablet Generic drug:  acetaminophen Take 650 mg by mouth daily.   pantoprazole 40 MG tablet Commonly known as:  PROTONIX Take 1 tablet (40 mg total) by mouth daily. Switch for any other PPI at similar dose and frequency       Review of Systems  Constitutional: Negative for activity change, appetite change, chills, diaphoresis, fatigue and fever.  HENT: Negative.   Eyes: Negative.  Respiratory: Negative.   Cardiovascular: Negative.   Gastrointestinal: Negative.   Genitourinary: Negative.   Musculoskeletal: Negative.   Skin: Positive for wound. Negative for color change, pallor and rash.  Neurological: Negative.   Psychiatric/Behavioral: Negative.     Immunization History  Administered Date(s) Administered  . Influenza,inj,Quad PF,36+ Mos 01/28/2015  . Influenza-Unspecified 02/14/2016   Pertinent  Health Maintenance Due  Topic Date Due  . FOOT EXAM  03/27/1929  . OPHTHALMOLOGY EXAM  03/27/1931  . PNA vac Low Risk Adult (1 of 2 - PCV13) 03/26/1986  . DEXA SCAN  05/29/2023 (Originally  03/26/1986)  . INFLUENZA VACCINE  12/26/2016  . HEMOGLOBIN A1C  03/06/2017   Fall Risk  09/04/2016  Falls in the past year? No   Functional Status Survey:    Vitals:   11/23/16 1457  BP: 124/78  Pulse: 99  Resp: 18  Temp: 97.6 F (36.4 C)  SpO2: 93%  Weight: 130 lb 8 oz (59.2 kg)  Height: 5\' 6"  (1.676 m)   Body mass index is 21.06 kg/m. Physical Exam  Constitutional: She is oriented to person, place, and time. She appears well-developed and well-nourished. No distress.  HENT:  Head: Normocephalic.  Eyes: Pupils are equal, round, and reactive to light.  Neck: No JVD present.  Cardiovascular: Normal rate, regular rhythm, normal heart sounds and intact distal pulses.  Exam reveals no gallop and no friction rub.   No murmur heard. Pulmonary/Chest: Effort normal. No respiratory distress. She has no wheezes. She has no rales. She exhibits no tenderness.  Abdominal: Soft. Bowel sounds are normal.  Musculoskeletal: Normal range of motion. She exhibits no edema or tenderness.  Neurological: She is alert and oriented to person, place, and time.  Skin: Skin is warm and dry. Laceration (skin tears) noted. She is not diaphoretic. No cyanosis. No pallor. Nails show no clubbing.     Psychiatric: She has a normal mood and affect. Her speech is normal and behavior is normal. Judgment and thought content normal. Cognition and memory are normal.  Nursing note and vitals reviewed.   Labs reviewed:  Recent Labs  09/04/16 1522 10/05/16 1328 10/06/16 0406  NA 140 138 139  K 4.0 4.4 4.2  CL 105 106 107  CO2 25 24 26   GLUCOSE 154* 104* 109*  BUN 28* 29* 28*  CREATININE 1.08 1.11* 1.08*  CALCIUM 9.3 9.2 8.4*    Recent Labs  12/01/15 1837 09/04/16 1522 10/05/16 1328  AST 27 18 30   ALT 13* 10 11*  ALKPHOS 83 95 84  BILITOT 1.1 0.6 1.5*  PROT 7.3 6.9 6.9  ALBUMIN 3.7 3.6 3.5    Recent Labs  12/01/15 1837  10/05/16 1328  10/07/16 0349 10/07/16 1120 10/10/16 1207  10/25/16 1135  WBC 5.0  < > 5.9  < > 4.2  --  6.4 5.5  NEUTROABS 2.8  --  3.7  --   --   --   --  3.2  HGB 13.9  < > 8.0*  < > 9.2* 9.4* 11.3* 12.0  HCT 41.6  < > 25.0*  < > 28.4*  --  35.9* 37.4  MCV 94.8  < > 84.2  < > 84.0  --  86.7 88.7  PLT 166  < > 191  < > 144*  --  238.0 217.0  < > = values in this interval not displayed. Lab Results  Component Value Date   TSH 0.06 (L) 10/10/2016   Lab Results  Component Value Date  HGBA1C 6.7 (H) 09/04/2016   Lab Results  Component Value Date   CHOL 141 09/04/2016   HDL 50.40 09/04/2016   LDLCALC 119 (H) 12/02/2015   LDLDIRECT 58.0 09/04/2016   TRIG 208.0 (H) 09/04/2016   CHOLHDL 3 09/04/2016    Significant Diagnostic Results in last 30 days:  No results found.  Assessment/Plan 1. Skin tear of left lower leg without complication, initial encounter  Apply silver alginate dressing, cover with Allevyn  Change dressings twice weekly until healed  Family/ staff Communication:   Total Time:  Documentation:  Face to Face:  Family/Phone:   Labs/tests ordered:    Medication list reviewed and assessed for continued appropriateness.  Vikki Ports, NP-C Geriatrics [facility] [phone], [place] [address] Cell Phone (Mon-Fri 8am-5pm):  [phone] On Call:  [phone] & follow prompts after 5pm & weekends Office Phone:  [phone] Office Fax:  [phone]

## 2016-11-25 ENCOUNTER — Encounter: Admission: RE | Admit: 2016-11-25 | Payer: Medicare Other | Source: Ambulatory Visit | Admitting: Internal Medicine

## 2016-11-26 ENCOUNTER — Ambulatory Visit: Payer: Medicare Other | Admitting: Family Medicine

## 2016-12-26 ENCOUNTER — Encounter
Admission: RE | Admit: 2016-12-26 | Discharge: 2016-12-26 | Disposition: A | Discharge status: Home / Self Care | Payer: Medicare Other | Source: Ambulatory Visit | Attending: Internal Medicine | Admitting: Internal Medicine

## 2016-12-31 ENCOUNTER — Non-Acute Institutional Stay: Payer: Medicare Other | Admitting: Gerontology

## 2016-12-31 ENCOUNTER — Encounter: Payer: Self-pay | Admitting: Gerontology

## 2016-12-31 DIAGNOSIS — R7989 Other specified abnormal findings of blood chemistry: Secondary | ICD-10-CM

## 2016-12-31 DIAGNOSIS — N183 Chronic kidney disease, stage 3 unspecified: Secondary | ICD-10-CM

## 2016-12-31 DIAGNOSIS — E785 Hyperlipidemia, unspecified: Secondary | ICD-10-CM

## 2016-12-31 DIAGNOSIS — M1A30X0 Chronic gout due to renal impairment, unspecified site, without tophus (tophi): Secondary | ICD-10-CM

## 2016-12-31 DIAGNOSIS — E559 Vitamin D deficiency, unspecified: Secondary | ICD-10-CM

## 2016-12-31 DIAGNOSIS — I10 Essential (primary) hypertension: Secondary | ICD-10-CM | POA: Diagnosis not present

## 2016-12-31 DIAGNOSIS — E1122 Type 2 diabetes mellitus with diabetic chronic kidney disease: Secondary | ICD-10-CM

## 2016-12-31 DIAGNOSIS — I2510 Atherosclerotic heart disease of native coronary artery without angina pectoris: Secondary | ICD-10-CM | POA: Diagnosis not present

## 2016-12-31 DIAGNOSIS — K219 Gastro-esophageal reflux disease without esophagitis: Secondary | ICD-10-CM | POA: Diagnosis not present

## 2016-12-31 DIAGNOSIS — E039 Hypothyroidism, unspecified: Secondary | ICD-10-CM

## 2016-12-31 DIAGNOSIS — M1990 Unspecified osteoarthritis, unspecified site: Secondary | ICD-10-CM | POA: Diagnosis not present

## 2016-12-31 DIAGNOSIS — R238 Other skin changes: Secondary | ICD-10-CM

## 2016-12-31 NOTE — Progress Notes (Signed)
Location:   [facility] [place] Room Number: [phone] Place of Service:  SNF [phone]) Provider:  Toni Arthurs, NP-C  Coral Spikes, DO  Patient Care Team: Coral Spikes DO as PCP - General [facility] Medicine)  Extended Emergency Contact Information Primary Emergency Contact: [place] of [place] Phone: [phone] Relation: Niece Secondary Emergency Contact: Charolotte Eke States of [place] Phone: [phone] Relation: Nephew  Code Status:  DNR Goals of care: Advanced Directive information Advanced Directives 12/31/2016  Does Patient Have a Medical Advance Directive? Yes  Type of Advance Directive Out of facility DNR (pink MOST or yellow form)  Does patient want to make changes to medical advance directive? No - Patient declined  Copy of [place] in Chart? -     Chief Complaint  Patient presents with  . Medical Management of Chronic Issues    Routine Visit    HPI:  Pt is a 83 y.o. female seen today for medical management of chronic diseases. Pt is a resident of the AL unit. She is mobile in the room with a walker, but uses a wheelchair for longer distances. She typically goes upstairs to the [place] unit and sits with her sister during the day. Pt is A&O. She is continent of B&B. She does not socialize much with the other residents as she is mostly with her sister. Pt reports her appetite is good and usually eats 75-100% of meals. She has not had any falls this month. Pt reports she is feeling well. Pt denies n/v/d/f/c/cp/sob/ha/abd pain/dizziness/cough. She does report some irritation on the Right lateral ankle. No visible injury or irritation. But, pt reports the skin is sensitive and is irritated when touched or socks/shoes rub the area. VSS. No other complaints.    Past Medical History:  Diagnosis Date  . Acute gouty arthritis 11/21/2016  . Aortic atherosclerosis ([place]) 09/04/2016  . Carotid artery disease  ([place]) 11/21/2016  . Carotid stenosis 12/03/2015   Advanced heavily calcified atherosclerotic disease at both carotid bifurcations. Stenoses measured at 70% affecting the right proximal ICA and 80% affecting the left proximal ICA.  Marland Kitchen Coronary atherosclerosis 09/04/2016  . Diverticulitis   . DM (diabetes mellitus), type 2 ([place]) 09/04/2016  . Gout   . HTN, goal below 140/90 11/21/2016  . Hyperlipidemia 06/20/2016  . Hypothyroidism, unspecified 11/21/2016  . Primary osteoarthritis 11/21/2016   involving multiple joints  . Stroke [facility])    Past Surgical History:  Procedure Laterality Date  . CATARACT EXTRACTION    . Finger crystal removal     right 3rd  . toe crystal removal     right 1st toe    No Known Allergies  Allergies as of 12/31/2016   No Known Allergies     Medication List       Accurate as of 12/31/16 12:29 PM. Always use your most recent med list.          atorvastatin 20 MG tablet Commonly known as:  LIPITOR Take 1 tablet (20 mg total) by mouth daily at 6 PM.   cholecalciferol 400 units Tabs tablet Commonly known as:  VITAMIN D Take 400 Units by mouth daily. 8 am   colchicine 0.6 MG tablet Take 0.6 mg by mouth daily.   levothyroxine 75 MCG tablet Commonly known as:  SYNTHROID, LEVOTHROID Take 1 tablet (75 mcg total) by mouth daily.   losartan 50 MG tablet Commonly known as:  COZAAR Take 50 mg by  mouth daily.   MAPAP ARTHRITIS PAIN 650 MG CR tablet Generic drug:  acetaminophen Take 650 mg by mouth daily.   pantoprazole 40 MG tablet Commonly known as:  PROTONIX Take 1 tablet (40 mg total) by mouth daily. Switch for any other PPI at similar dose and frequency       Review of Systems  Constitutional: Negative for activity change, appetite change, chills, diaphoresis, fatigue and fever.  HENT: Negative for congestion, sneezing, sore throat, trouble swallowing and voice change.   Eyes: Negative.   Respiratory: Negative for apnea, cough, choking, chest  tightness, shortness of breath and wheezing.   Cardiovascular: Negative for chest pain, palpitations and leg swelling.  Gastrointestinal: Negative for abdominal distention, abdominal pain, constipation, diarrhea and nausea.  Genitourinary: Negative for difficulty urinating, dysuria, frequency and urgency.  Musculoskeletal: Positive for arthralgias (typical arthritis). Negative for back pain, gait problem and myalgias.  Skin: Negative for color change, pallor, rash and wound.  Neurological: Negative for dizziness, tremors, syncope, speech difficulty, weakness, numbness and headaches.  Psychiatric/Behavioral: Negative for agitation and behavioral problems.  All other systems reviewed and are negative.   Immunization History  Administered Date(s) Administered  . Influenza,inj,Quad PF,36+ Mos 01/28/2015  . Influenza-Unspecified 02/14/2016   Pertinent  Health Maintenance Due  Topic Date Due  . FOOT EXAM  03/27/1929  . OPHTHALMOLOGY EXAM  03[birth year]27/1929  . PNA vac Low Risk Adult (1 of 2 - PCV13) 03/26/1986  . INFLUENZA VACCINE  12/26/2016  . DEXA SCAN  05/29/2023 (Originally 03/26/1986)  . HEMOGLOBIN A1C  03/06/2017   Fall Risk  09/04/2016  Falls in the past year? No   Functional Status Survey:    Vitals:   12/31/16 1217  BP: 124/78  Pulse: 99  Resp: 18  Temp: 97.6 F (36.4 C)  SpO2: 93%  Weight: 130 lb 8 oz (59.2 kg)  Height: 5\' 6"  (1.676 m)   Body mass index is 21.06 kg/m. Physical Exam  Constitutional: She is oriented to person, place, and time. Vital signs are normal. She appears well-developed and well-nourished. She is active and cooperative. She does not appear ill. No distress.  HENT:  Head: Normocephalic and atraumatic.  Mouth/Throat: Uvula is midline, oropharynx is clear and moist and mucous membranes are normal. Mucous membranes are not pale, not dry and not cyanotic.  Eyes: Pupils are equal, round, and reactive to light. Conjunctivae, EOM and lids are normal.  Neck:  Trachea normal, normal range of motion and full passive range of motion without pain. Neck supple. No JVD present. No tracheal deviation, no edema and no erythema present. No thyromegaly present.  Cardiovascular: Normal rate, regular rhythm, normal heart sounds, intact distal pulses and normal pulses.  Exam reveals no gallop, no distant heart sounds and no friction rub.   No murmur heard. Pulses:      Dorsalis pedis pulses are 2+ on the right side, and 2+ on the left side.  No edema  Pulmonary/Chest: Effort normal and breath sounds normal. No accessory muscle usage. No respiratory distress. She has no decreased breath sounds. She has no wheezes. She has no rhonchi. She has no rales. She exhibits no tenderness.  Abdominal: Normal appearance and bowel sounds are normal. She exhibits no distension and no ascites. There is no tenderness.  Musculoskeletal: Normal range of motion. She exhibits no edema or tenderness.  Expected osteoarthritis, stiffness  Neurological: She is alert and oriented to person, place, and time. She has normal strength.  Skin: Skin is warm, dry  and intact. No rash noted. She is not diaphoretic. No cyanosis or erythema. No pallor. Nails show no clubbing.  Psychiatric: She has a normal mood and affect. Her speech is normal and behavior is normal. Judgment and thought content normal. Cognition and memory are normal.  Nursing note and vitals reviewed.   Labs reviewed:  Recent Labs  09/04/16 1522 10/05/16 1328 10/06/16 0406  NA 140 138 139  K 4.0 4.4 4.2  CL 105 106 107  CO2 25 24 26   GLUCOSE 154* 104* 109*  BUN 28* 29* 28*  CREATININE 1.08 1.11* 1.08*  CALCIUM 9.3 9.2 8.4*    Recent Labs  09/04/16 1522 10/05/16 1328  AST 18 30  ALT 10 11*  ALKPHOS 95 84  BILITOT 0.6 1.5*  PROT 6.9 6.9  ALBUMIN 3.6 3.5    Recent Labs  10/05/16 1328  10/07/16 0349 10/07/16 1120 10/10/16 1207 10/25/16 1135  WBC 5.9  < > 4.2  --  6.4 5.5  NEUTROABS 3.7  --   --   --    --  3.2  HGB 8.0*  < > 9.2* 9.4* 11.3* 12.0  HCT 25.0*  < > 28.4*  --  35.9* 37.4  MCV 84.2  < > 84.0  --  86.7 88.7  PLT 191  < > 144*  --  238.0 217.0  < > = values in this interval not displayed. Lab Results  Component Value Date   TSH 0.06 (L) 10/10/2016   Lab Results  Component Value Date   HGBA1C 6.7 (H) 09/04/2016   Lab Results  Component Value Date   CHOL 141 09/04/2016   HDL 50.40 09/04/2016   LDLCALC 119 (H) 12/02/2015   LDLDIRECT 58.0 09/04/2016   TRIG 208.0 (H) 09/04/2016   CHOLHDL 3 09/04/2016    Significant Diagnostic Results in last 30 days:  No results found.  Assessment/Plan 1. Atherosclerosis of native coronary artery of native heart without angina pectoris  Stable   2. Essential hypertension  Stable  Continue Losartan 50 mg po Q Day  3. Type 2 diabetes mellitus with stage 3 chronic kidney disease, without long-term current use of insulin (HCC)  Stable   4. CKD (chronic kidney disease) stage 3, GFR 30-59 ml/min  Stable  Renally adjust medications as appropriate  5. Chronic gout due to renal impairment without tophus, unspecified site  Stable  Continue colchicine 0.6 mg po Q Day  6. Hypothyroidism, unspecified type  Stable  Continue Levothyroxine 75 mcg po Q Day  7. Hyperlipidemia, unspecified hyperlipidemia type  Stable  Continue Lipitor 20 mg po Q Day  8. Gastroesophageal reflux disease without esophagitis  Stable  Continue Protonix 40 mg po Q Day  9. Osteoarthritis, unspecified osteoarthritis type, unspecified site  Stable   Continue APAP CR 650 mg po Q Day  10. Low vitamin D level  Stable   Continue Cholecalciferol 400 units po Q Day  11. Skin irritation  Stable  Duoderm patch- cut into 1/4. Apply 1 piece to the Right Lateral Ankle Q 5 days for protection from [employer] staff Communication:   Total Time:  Documentation:  Face to Face:  Family/Phone:   Labs/tests ordered:     Medication list reviewed and assessed for continued appropriateness. Monthly medication orders reviewed and signed.  Vikki Ports, NP-C Geriatrics [facility] [phone], [place] [address] Cell Phone (Mon-Fri 8am-5pm):  [phone] On Call:  [phone] & follow prompts  after 5pm & weekends Office Phone:  [phone] Office Fax:  [phone]

## 2017-01-16 DIAGNOSIS — R7989 Other specified abnormal findings of blood chemistry: Secondary | ICD-10-CM | POA: Insufficient documentation

## 2017-01-16 DIAGNOSIS — M1A30X0 Chronic gout due to renal impairment, unspecified site, without tophus (tophi): Secondary | ICD-10-CM | POA: Insufficient documentation

## 2017-01-16 DIAGNOSIS — I2510 Atherosclerotic heart disease of native coronary artery without angina pectoris: Secondary | ICD-10-CM | POA: Insufficient documentation

## 2017-01-16 DIAGNOSIS — K219 Gastro-esophageal reflux disease without esophagitis: Secondary | ICD-10-CM | POA: Insufficient documentation

## 2017-01-24 ENCOUNTER — Telehealth: Payer: Self-pay | Admitting: Family Medicine

## 2017-01-24 NOTE — Telephone Encounter (Signed)
Pt niece Kieth Brightly called and was wondering if something for gout can be called in for pt. Pt is having some gout in her rt ankle where it is swollen, she states that it has been like that for a couple of weeks. Please advise, thank you!  Call [facility] @ 336 [phone]

## 2017-01-24 NOTE — Telephone Encounter (Signed)
Notified patient niece that patient has colchicine on medication list for Gout , patient is in SNF and that this medication should be on her medication list at facility.

## 2017-01-26 ENCOUNTER — Encounter
Admission: RE | Admit: 2017-01-26 | Discharge: 2017-01-26 | Disposition: A | Discharge status: Home / Self Care | Payer: Medicare Other | Source: Ambulatory Visit | Attending: Internal Medicine | Admitting: Internal Medicine

## 2017-01-31 ENCOUNTER — Non-Acute Institutional Stay: Payer: Medicare Other | Admitting: Gerontology

## 2017-01-31 ENCOUNTER — Encounter: Payer: Self-pay | Admitting: Gerontology

## 2017-01-31 DIAGNOSIS — E039 Hypothyroidism, unspecified: Secondary | ICD-10-CM

## 2017-01-31 DIAGNOSIS — I10 Essential (primary) hypertension: Secondary | ICD-10-CM | POA: Diagnosis not present

## 2017-01-31 DIAGNOSIS — E785 Hyperlipidemia, unspecified: Secondary | ICD-10-CM | POA: Diagnosis not present

## 2017-01-31 DIAGNOSIS — E1122 Type 2 diabetes mellitus with diabetic chronic kidney disease: Secondary | ICD-10-CM | POA: Diagnosis not present

## 2017-01-31 DIAGNOSIS — N183 Chronic kidney disease, stage 3 unspecified: Secondary | ICD-10-CM

## 2017-01-31 DIAGNOSIS — K219 Gastro-esophageal reflux disease without esophagitis: Secondary | ICD-10-CM | POA: Diagnosis not present

## 2017-01-31 DIAGNOSIS — M1990 Unspecified osteoarthritis, unspecified site: Secondary | ICD-10-CM | POA: Diagnosis not present

## 2017-01-31 DIAGNOSIS — M1A30X0 Chronic gout due to renal impairment, unspecified site, without tophus (tophi): Secondary | ICD-10-CM

## 2017-01-31 DIAGNOSIS — R238 Other skin changes: Secondary | ICD-10-CM | POA: Diagnosis not present

## 2017-01-31 DIAGNOSIS — E559 Vitamin D deficiency, unspecified: Secondary | ICD-10-CM | POA: Diagnosis not present

## 2017-01-31 DIAGNOSIS — I2510 Atherosclerotic heart disease of native coronary artery without angina pectoris: Secondary | ICD-10-CM | POA: Diagnosis not present

## 2017-01-31 DIAGNOSIS — R7989 Other specified abnormal findings of blood chemistry: Secondary | ICD-10-CM

## 2017-01-31 NOTE — Progress Notes (Signed)
Location:   [facility] Room Number: [place]:  ALF [id number]) Provider:  Toni Arthurs, NP-C  Coral Spikes, DO  Patient Care Team: Coral Spikes DO as PCP - General [facility])  Extended Emergency Contact Information Primary Emergency Contact: [place] of [place] Phone: [phone] Relation: Niece Secondary Emergency Contact: Charolotte Eke [place] of [place] Phone: [phone] Relation: Nephew  Code Status:  DNR Goals of care: Advanced Directive information Advanced Directives 01/31/2017  Does Patient Have a Medical Advance Directive? Yes  Type of Advance Directive Out of facility DNR (pink MOST or yellow form)  Does patient want to make changes to medical advance directive? No - Patient declined  Copy of [place] in Chart? -     Chief Complaint  Patient presents with  . Medical Management of Chronic Issues    Routine Visit    HPI:  Pt is a 83 y.o. female seen today for medical management of chronic diseases. Pt is a resident of the AL unit. She is mobile in the room with a walker, but uses a wheelchair for longer distances. She typically goes upstairs to the [place] unit and sits with her sister during the day. Pt is A&O. She is continent of B&B. She does not socialize much with the other residents as she is mostly with her sister. Pt reports her appetite is good and usually eats 75-100% of meals. She has not had any falls this month. Pt reports she is feeling well. Pt denies n/v/d/f/c/cp/sob/ha/abd pain/dizziness/cough. She does report some irritation on the Right lateral ankle. No visible injury or irritation. But, pt reports the skin is sensitive and is irritated when touched or socks/shoes rub the area. VSS. No other complaints.    Past Medical History:  Diagnosis Date  . Acute gouty arthritis 11/21/2016  . Aortic atherosclerosis ([place]) 09/04/2016  . Carotid artery disease  ([place]) 11/21/2016  . Carotid stenosis 12/03/2015   Advanced heavily calcified atherosclerotic disease at both carotid bifurcations. Stenoses measured at 70% affecting the right proximal ICA and 80% affecting the left proximal ICA.  Marland Kitchen Coronary atherosclerosis 09/04/2016  . Diverticulitis   . DM (diabetes mellitus), type 2 ([place]) 09/04/2016  . Gout   . HTN, goal below 140/90 11/21/2016  . Hyperlipidemia 06/20/2016  . Hypothyroidism, unspecified 11/21/2016  . Primary osteoarthritis 11/21/2016   involving multiple joints  . Stroke [facility])    Past Surgical History:  Procedure Laterality Date  . CATARACT EXTRACTION    . Finger crystal removal     right 3rd  . toe crystal removal     right 1st toe    No Known Allergies  Allergies as of 01/31/2017   No Known Allergies     Medication List       Accurate as of 01/31/17  8:49 AM. Always use your most recent med list.          atorvastatin 20 MG tablet Commonly known as:  LIPITOR Take 1 tablet (20 mg total) by mouth daily at 6 PM.   cholecalciferol 400 units Tabs tablet Commonly known as:  VITAMIN D Take 400 Units by mouth daily. 8 am   colchicine 0.6 MG tablet Take 0.6 mg by mouth daily.   levothyroxine 75 MCG tablet Commonly known as:  SYNTHROID, LEVOTHROID Take 1 tablet (75 mcg total) by mouth daily.   losartan 50 MG tablet Commonly known as:  COZAAR Take 50 mg  by mouth daily.   MAPAP ARTHRITIS PAIN 650 MG CR tablet Generic drug:  acetaminophen Take 650 mg by mouth daily.   pantoprazole 40 MG tablet Commonly known as:  PROTONIX Take 1 tablet (40 mg total) by mouth daily. Switch for any other PPI at similar dose and frequency       Review of Systems  Constitutional: Negative for activity change, appetite change, chills, diaphoresis, fatigue and fever.  HENT: Negative for congestion, sneezing, sore throat, trouble swallowing and voice change.   Eyes: Negative.   Respiratory: Negative for apnea, cough, choking, chest  tightness, shortness of breath and wheezing.   Cardiovascular: Negative for chest pain, palpitations and leg swelling.  Gastrointestinal: Negative for abdominal distention, abdominal pain, constipation, diarrhea and nausea.  Genitourinary: Negative for difficulty urinating, dysuria, frequency and urgency.  Musculoskeletal: Positive for arthralgias (typical arthritis). Negative for back pain, gait problem and myalgias.  Skin: Negative for color change, pallor, rash and wound.  Neurological: Negative for dizziness, tremors, syncope, speech difficulty, weakness, numbness and headaches.  Psychiatric/Behavioral: Negative for agitation and behavioral problems.  All other systems reviewed and are negative.   Immunization History  Administered Date(s) Administered  . Influenza,inj,Quad PF,6+ Mos 01/28/2015  . Influenza-Unspecified 02/14/2016   Pertinent  Health Maintenance Due  Topic Date Due  . FOOT EXAM  03/27/1929  . OPHTHALMOLOGY EXAM  03/27/1929  . PNA vac Low Risk Adult (1 of 2 - PCV13) 03/26/1986  . INFLUENZA VACCINE  12/26/2016  . DEXA SCAN  05/29/2023 (Originally 03/26/1986)  . HEMOGLOBIN A1C  03/06/2017   Fall Risk  09/04/2016  Falls in the past year? No   Functional Status Survey:    Vitals:   01/31/17 0845  BP: 124/78  Pulse: 99  Resp: 18  Temp: 97.6 F (36.4 C)  SpO2: 93%  Weight: 130 lb 8 oz (59.2 kg)  Height: '5\' 6"'  (1.676 m)   Body mass index is 21.06 kg/m. Physical Exam  Constitutional: She is oriented to person, place, and time. Vital signs are normal. She appears well-developed and well-nourished. She is active and cooperative. She does not appear ill. No distress.  HENT:  Head: Normocephalic and atraumatic.  Mouth/Throat: Uvula is midline, oropharynx is clear and moist and mucous membranes are normal. Mucous membranes are not pale, not dry and not cyanotic.  Eyes: Pupils are equal, round, and reactive to light. Conjunctivae, EOM and lids are normal.  Neck:  Trachea normal, normal range of motion and full passive range of motion without pain. Neck supple. No JVD present. No tracheal deviation, no edema and no erythema present. No thyromegaly present.  Cardiovascular: Normal rate, regular rhythm, normal heart sounds, intact distal pulses and normal pulses.  Exam reveals no gallop, no distant heart sounds and no friction rub.   No murmur heard. Pulses:      Dorsalis pedis pulses are 2+ on the right side, and 2+ on the left side.  No edema  Pulmonary/Chest: Effort normal and breath sounds normal. No accessory muscle usage. No respiratory distress. She has no decreased breath sounds. She has no wheezes. She has no rhonchi. She has no rales. She exhibits no tenderness.  Abdominal: Normal appearance and bowel sounds are normal. She exhibits no distension and no ascites. There is no tenderness.  Musculoskeletal: Normal range of motion. She exhibits no edema or tenderness.  Expected osteoarthritis, stiffness  Neurological: She is alert and oriented to person, place, and time. She has normal strength.  Skin: Skin is warm,  dry and intact. No rash noted. She is not diaphoretic. No cyanosis or erythema. No pallor. Nails show no clubbing.  Psychiatric: She has a normal mood and affect. Her speech is normal and behavior is normal. Judgment and thought content normal. Cognition and memory are normal.  Nursing note and vitals reviewed.   Labs reviewed:  Recent Labs  09/04/16 1522 10/05/16 1328 10/06/16 0406  NA 140 138 139  K 4.0 4.4 4.2  CL 105 106 107  CO2 '25 24 26  ' GLUCOSE 154* 104* 109*  BUN 28* 29* 28*  CREATININE 1.08 1.11* 1.08*  CALCIUM 9.3 9.2 8.4*    Recent Labs  09/04/16 1522 10/05/16 1328  AST 18 30  ALT 10 11*  ALKPHOS 95 84  BILITOT 0.6 1.5*  PROT 6.9 6.9  ALBUMIN 3.6 3.5    Recent Labs  10/05/16 1328  10/07/16 0349 10/07/16 1120 10/10/16 1207 10/25/16 1135  WBC 5.9  < > 4.2  --  6.4 5.5  NEUTROABS 3.7  --   --   --    --  3.2  HGB 8.0*  < > 9.2* 9.4* 11.3* 12.0  HCT 25.0*  < > 28.4*  --  35.9* 37.4  MCV 84.2  < > 84.0  --  86.7 88.7  PLT 191  < > 144*  --  238.0 217.0  < > = values in this interval not displayed. Lab Results  Component Value Date   TSH 0.06 (L) 10/10/2016   Lab Results  Component Value Date   HGBA1C 6.7 (H) 09/04/2016   Lab Results  Component Value Date   CHOL 141 09/04/2016   HDL 50.40 09/04/2016   LDLCALC 119 (H) 12/02/2015   LDLDIRECT 58.0 09/04/2016   TRIG 208.0 (H) 09/04/2016   CHOLHDL 3 09/04/2016    Significant Diagnostic Results in last 30 days:  No results found.  Assessment/Plan 1. Atherosclerosis of native coronary artery of native heart without angina pectoris  Stable   2. Essential hypertension  Stable  Continue Losartan 50 mg po Q Day  3. Type 2 diabetes mellitus with stage 3 chronic kidney disease, without long-term current use of insulin (HCC)  Stable   4. CKD (chronic kidney disease) stage 3, GFR 30-59 ml/min  Stable  Renally adjust medications as appropriate  5. Chronic gout due to renal impairment without tophus, unspecified site  Stable  Continue colchicine 0.6 mg po Q Day  6. Hypothyroidism, unspecified type  Stable  Continue Levothyroxine 75 mcg po Q Day  7. Hyperlipidemia, unspecified hyperlipidemia type  Stable  Continue Lipitor 20 mg po Q Day  8. Gastroesophageal reflux disease without esophagitis  Stable  Continue Protonix 40 mg po Q Day  9. Osteoarthritis, unspecified osteoarthritis type, unspecified site  Stable   Continue APAP CR 650 mg po Q Day  10. Low vitamin D level  Stable   Continue Cholecalciferol 400 units po Q Day  11. Skin irritation  Stable  Duoderm patch- cut into 1/4. Apply 1 piece to the Right Lateral Ankle Q 5 days for protection from [employer] staff Communication:   Total Time:  Documentation:  Face to Face:  Family/Phone:   Labs/tests ordered:  Cbc, met c,  tsh, T4, B12, D, Mag+, lipid panel  Medication list reviewed and assessed for continued appropriateness. Monthly medication orders reviewed and signed.  Vikki Ports, NP-C Geriatrics [facility] [phone], [place] [address] Cell Phone (Mon-Fri  8am-5pm):  [phone] On Call:  [phone] & follow prompts after 5pm & weekends Office Phone:  [phone] Office Fax:  [phone]

## 2017-02-01 ENCOUNTER — Telehealth: Payer: Self-pay | Admitting: Family Medicine

## 2017-02-14 ENCOUNTER — Other Ambulatory Visit
Admission: RE | Admit: 2017-02-14 | Discharge: 2017-02-14 | Disposition: A | Discharge status: Home / Self Care | Payer: Medicare Other | Source: Ambulatory Visit | Attending: Gerontology | Admitting: Gerontology

## 2017-02-14 DIAGNOSIS — E1122 Type 2 diabetes mellitus with diabetic chronic kidney disease: Secondary | ICD-10-CM | POA: Diagnosis not present

## 2017-02-14 LAB — LIPID PANEL
Cholesterol: 156 mg/dL (ref 0–200)
HDL: 49 mg/dL (ref >40)
LDL CALC: 57 mg/dL (ref 0–99)
Total CHOL/HDL Ratio: 3.2 RATIO
Triglycerides: 251 mg/dL — ABNORMAL HIGH (ref <150)
VLDL: 50 mg/dL — ABNORMAL HIGH (ref 0–40)

## 2017-02-14 LAB — COMPREHENSIVE METABOLIC PANEL
ALBUMIN: 3.3 g/dL — AB (ref 3.5–5.0)
ALT: 23 U/L (ref 14–54)
AST: 34 U/L (ref 15–41)
Alkaline Phosphatase: 144 U/L — ABNORMAL HIGH (ref 38–126)
Anion gap: 5 (ref 5–15)
BUN: 39 mg/dL — ABNORMAL HIGH (ref 6–20)
CALCIUM: 9.3 mg/dL (ref 8.9–10.3)
CHLORIDE: 112 mmol/L — AB (ref 101–111)
CO2: 23 mmol/L (ref 22–32)
CREATININE: 1.13 mg/dL — AB (ref 0.44–1.00)
GFR calc Af Amer: 46 mL/min — ABNORMAL LOW (ref >60)
GFR, EST NON AFRICAN AMERICAN: 40 mL/min — AB (ref >60)
Glucose, Bld: 120 mg/dL — ABNORMAL HIGH (ref 65–99)
POTASSIUM: 5.1 mmol/L (ref 3.5–5.1)
SODIUM: 140 mmol/L (ref 135–145)
TOTAL PROTEIN: 6.6 g/dL (ref 6.5–8.1)
Total Bilirubin: 0.6 mg/dL (ref 0.3–1.2)

## 2017-02-14 LAB — CBC WITH DIFFERENTIAL/PLATELET
BASOS ABS: 0.1 10*3/uL (ref 0–0.1)
Basophils Relative: 3 %
Eosinophils Absolute: 0.1 10*3/uL (ref 0–0.7)
Eosinophils Relative: 3 %
HCT: 33.5 % — ABNORMAL LOW (ref 35.0–47.0)
Hemoglobin: 11.3 g/dL — ABNORMAL LOW (ref 12.0–16.0)
LYMPHS PCT: 29 %
Lymphs Abs: 1.3 10*3/uL (ref 1.0–3.6)
MCH: 30.5 pg (ref 26.0–34.0)
MCHC: 33.7 g/dL (ref 32.0–36.0)
MCV: 90.4 fL (ref 80.0–100.0)
Monocytes Absolute: 0.8 10*3/uL (ref 0.2–0.9)
Monocytes Relative: 18 %
Neutro Abs: 2.1 10*3/uL (ref 1.4–6.5)
Neutrophils Relative %: 47 %
PLATELETS: 173 10*3/uL (ref 150–440)
RBC: 3.7 MIL/uL — ABNORMAL LOW (ref 3.80–5.20)
RDW: 18.8 % — AB (ref 11.5–14.5)
WBC: 4.5 10*3/uL (ref 3.6–11.0)

## 2017-02-14 LAB — VITAMIN B12: Vitamin B-12: 295 pg/mL (ref 180–914)

## 2017-02-14 LAB — MAGNESIUM: MAGNESIUM: 1.9 mg/dL (ref 1.7–2.4)

## 2017-02-14 LAB — TSH: TSH: 0.647 u[IU]/mL (ref 0.350–4.500)

## 2017-02-15 LAB — VITAMIN D 25 HYDROXY (VIT D DEFICIENCY, FRACTURES): Vit D, 25-Hydroxy: 24.1 ng/mL — ABNORMAL LOW (ref 30.0–100.0)

## 2017-02-15 LAB — T4: T4, Total: 6.2 ug/dL (ref 4.5–12.0)

## 2017-02-25 ENCOUNTER — Encounter
Admission: RE | Admit: 2017-02-25 | Discharge: 2017-02-25 | Disposition: A | Discharge status: Home / Self Care | Payer: Medicare Other | Source: Ambulatory Visit | Attending: Internal Medicine | Admitting: Internal Medicine

## 2017-02-28 ENCOUNTER — Non-Acute Institutional Stay: Payer: Medicare Other | Admitting: Gerontology

## 2017-02-28 ENCOUNTER — Encounter: Payer: Self-pay | Admitting: Gerontology

## 2017-02-28 DIAGNOSIS — M1A30X0 Chronic gout due to renal impairment, unspecified site, without tophus (tophi): Secondary | ICD-10-CM

## 2017-02-28 DIAGNOSIS — R7989 Other specified abnormal findings of blood chemistry: Secondary | ICD-10-CM

## 2017-02-28 DIAGNOSIS — I10 Essential (primary) hypertension: Secondary | ICD-10-CM

## 2017-02-28 DIAGNOSIS — E538 Deficiency of other specified B group vitamins: Secondary | ICD-10-CM

## 2017-02-28 DIAGNOSIS — I2510 Atherosclerotic heart disease of native coronary artery without angina pectoris: Secondary | ICD-10-CM | POA: Diagnosis not present

## 2017-02-28 DIAGNOSIS — M1990 Unspecified osteoarthritis, unspecified site: Secondary | ICD-10-CM

## 2017-02-28 DIAGNOSIS — N183 Chronic kidney disease, stage 3 unspecified: Secondary | ICD-10-CM

## 2017-02-28 DIAGNOSIS — R238 Other skin changes: Secondary | ICD-10-CM | POA: Diagnosis not present

## 2017-02-28 DIAGNOSIS — E039 Hypothyroidism, unspecified: Secondary | ICD-10-CM

## 2017-02-28 DIAGNOSIS — K219 Gastro-esophageal reflux disease without esophagitis: Secondary | ICD-10-CM | POA: Diagnosis not present

## 2017-02-28 DIAGNOSIS — E785 Hyperlipidemia, unspecified: Secondary | ICD-10-CM | POA: Diagnosis not present

## 2017-02-28 DIAGNOSIS — E1122 Type 2 diabetes mellitus with diabetic chronic kidney disease: Secondary | ICD-10-CM | POA: Diagnosis not present

## 2017-02-28 NOTE — Progress Notes (Signed)
Location:   [facility] Room Number: [phone] Place of Service:  ALF [phone]) Provider:  Toni Arthurs, NP-C  Leone Haven, MD  Patient Care Team: Leone Haven, MD as PCP - General (Family Medicine)  Extended Emergency Contact Information Primary Emergency Contact: [place] of [place] Phone: [phone] Relation: Niece Secondary Emergency Contact: Charolotte Eke [place] of [place] Phone: [phone] Relation: Nephew  Code Status:  DNR Goals of care: Advanced Directive information Advanced Directives 02/28/2017  Does Patient Have a Medical Advance Directive? Yes  Type of Advance Directive Out of facility DNR (pink MOST or yellow form)  Does patient want to make changes to medical advance directive? No - Patient declined  Copy of [place] in Chart? -     Chief Complaint  Patient presents with  . Medical Management of Chronic Issues    Routine Visit    HPI:  Pt is a 83 y.o. female seen today for medical management of chronic diseases.  Patient is a resident of the assisted living unit.  She is mobile only unit with a walker, but uses a wheelchair for longer distances.  She typically goes up stairs to the [place] unit and sits with her sister during the day.  Patient is alert and oriented.  She is continent of bowel and bladder.  She does not socialize much with the other residents that she is mostly with her sister.  Patient reports her appetite is good and usually eats 75-100% of meals.  She has not had any falls this month.  Patient reports she is feeling well.  She does report some irritation on the right lateral ankle.  No visible injury or irritation.  The patient reports her skin is sensitive and is irritating when touched or socks/shoes rub the area.  Vital signs stable.  No other complaints.   Past Medical History:  Diagnosis Date  . Acute gouty arthritis 11/21/2016  . Aortic  atherosclerosis ([place]) 09/04/2016  . Carotid artery disease ([place]) 11/21/2016  . Carotid stenosis 12/03/2015   Advanced heavily calcified atherosclerotic disease at both carotid bifurcations. Stenoses measured at 70% affecting the right proximal ICA and 80% affecting the left proximal ICA.  Marland Kitchen Coronary atherosclerosis 09/04/2016  . Diverticulitis   . DM (diabetes mellitus), type 2 ([place]) 09/04/2016  . Gout   . HTN, goal below 140/90 11/21/2016  . Hyperlipidemia 06/20/2016  . Hypothyroidism, unspecified 11/21/2016  . Primary osteoarthritis 11/21/2016   involving multiple joints  . Stroke [facility])    Past Surgical History:  Procedure Laterality Date  . CATARACT EXTRACTION    . Finger crystal removal     right 3rd  . toe crystal removal     right 1st toe    No Known Allergies  Allergies as of 02/28/2017   No Known Allergies     Medication List       Accurate as of 02/28/17 10:50 AM. Always use your most recent med list.          atorvastatin 20 MG tablet Commonly known as:  LIPITOR Take 1 tablet (20 mg total) by mouth daily at 6 PM.   colchicine 0.6 MG tablet Take 0.6 mg by mouth daily.   cyanocobalamin 1000 MCG tablet Take 1,000 mcg by mouth daily.   levothyroxine 75 MCG tablet Commonly known as:  SYNTHROID, LEVOTHROID Take 1 tablet (75 mcg total) by mouth daily.   losartan 50 MG tablet Commonly known  as:  COZAAR Take 50 mg by mouth daily.   MAPAP ARTHRITIS PAIN 650 MG CR tablet Generic drug:  acetaminophen Take 650 mg by mouth daily.   pantoprazole 40 MG tablet Commonly known as:  PROTONIX Take 1 tablet (40 mg total) by mouth daily. Switch for any other PPI at similar dose and frequency   Vitamin D3 2000 units capsule Take 2,000 Units by mouth daily.       Review of Systems  Constitutional: Negative for activity change, appetite change, chills, diaphoresis, fatigue and fever.  HENT: Negative for congestion, sneezing, sore throat, trouble swallowing and voice  change.   Eyes: Negative.   Respiratory: Negative for apnea, cough, choking, chest tightness, shortness of breath and wheezing.   Cardiovascular: Negative for chest pain, palpitations and leg swelling.  Gastrointestinal: Negative for abdominal distention, abdominal pain, constipation, diarrhea and nausea.  Genitourinary: Negative for difficulty urinating, dysuria, frequency and urgency.  Musculoskeletal: Positive for arthralgias (typical arthritis). Negative for back pain, gait problem and myalgias.  Skin: Negative for color change, pallor, rash and wound.  Neurological: Negative for dizziness, tremors, syncope, speech difficulty, weakness, numbness and headaches.  Psychiatric/Behavioral: Negative for agitation and behavioral problems.  All other systems reviewed and are negative.   Immunization History  Administered Date(s) Administered  . Influenza,inj,Quad PF,6+ Mos 01/28/2015  . Influenza-Unspecified 02/14/2016  . PPD Test 12/28/2016   Pertinent  Health Maintenance Due  Topic Date Due  . FOOT EXAM  03/27/1929  . OPHTHALMOLOGY EXAM  03/27/1929  . PNA vac Low Risk Adult (1 of 2 - PCV13) 03/26/1986  . INFLUENZA VACCINE  12/26/2016  . DEXA SCAN  05/29/2023 (Originally 03/26/1986)  . HEMOGLOBIN A1C  03/06/2017   Fall Risk  09/04/2016  Falls in the past year? No   Functional Status Survey:    Vitals:   02/28/17 1043  BP: 124/78  Pulse: 99  Resp: 18  Temp: 99 F (37.2 C)  SpO2: 93%  Weight: 136 lb 14.4 oz (62.1 kg)  Height: 5\' 6"  (1.676 m)   Body mass index is 22.1 kg/m. Physical Exam  Constitutional: She is oriented to person, place, and time. Vital signs are normal. She appears well-developed and well-nourished. She is active and cooperative. She does not appear ill. No distress.  HENT:  Head: Normocephalic and atraumatic.  Mouth/Throat: Uvula is midline, oropharynx is clear and moist and mucous membranes are normal. Mucous membranes are not pale, not dry and not  cyanotic.  Eyes: Pupils are equal, round, and reactive to light. Conjunctivae, EOM and lids are normal.  Neck: Trachea normal, normal range of motion and full passive range of motion without pain. Neck supple. No JVD present. No tracheal deviation, no edema and no erythema present. No thyromegaly present.  Cardiovascular: Normal rate, regular rhythm, normal heart sounds, intact distal pulses and normal pulses.  Exam reveals no gallop, no distant heart sounds and no friction rub.   No murmur heard. Pulses:      Dorsalis pedis pulses are 2+ on the right side, and 2+ on the left side.  No edema  Pulmonary/Chest: Effort normal and breath sounds normal. No accessory muscle usage. No respiratory distress. She has no decreased breath sounds. She has no wheezes. She has no rhonchi. She has no rales. She exhibits no tenderness.  Abdominal: Soft. Normal appearance and bowel sounds are normal. She exhibits no distension and no ascites. There is no tenderness.  Musculoskeletal: Normal range of motion. She exhibits no edema or tenderness.  Expected osteoarthritis, stiffness  Neurological: She is alert and oriented to person, place, and time. She has normal strength.  Skin: Skin is warm, dry and intact. No rash noted. She is not diaphoretic. No cyanosis or erythema. No pallor. Nails show no clubbing.  Psychiatric: She has a normal mood and affect. Her speech is normal and behavior is normal. Judgment and thought content normal. Cognition and memory are normal.  Nursing note and vitals reviewed.   Labs reviewed:  Recent Labs  10/05/16 1328 10/06/16 0406 02/14/17 0435  NA 138 139 140  K 4.4 4.2 5.1  CL 106 107 112*  CO2 24 26 23   GLUCOSE 104* 109* 120*  BUN 29* 28* 39*  CREATININE 1.11* 1.08* 1.13*  CALCIUM 9.2 8.4* 9.3  MG  --   --  1.9    Recent Labs  09/04/16 1522 10/05/16 1328 02/14/17 0435  AST 18 30 34  ALT 10 11* 23  ALKPHOS 95 84 144*  BILITOT 0.6 1.5* 0.6  PROT 6.9 6.9 6.6    ALBUMIN 3.6 3.5 3.3*    Recent Labs  10/05/16 1328  10/10/16 1207 10/25/16 1135 02/14/17 0435  WBC 5.9  < > 6.4 5.5 4.5  NEUTROABS 3.7  --   --  3.2 2.1  HGB 8.0*  < > 11.3* 12.0 11.3*  HCT 25.0*  < > 35.9* 37.4 33.5*  MCV 84.2  < > 86.7 88.7 90.4  PLT 191  < > 238.0 217.0 173  < > = values in this interval not displayed. Lab Results  Component Value Date   TSH 0.647 02/14/2017   Lab Results  Component Value Date   HGBA1C 6.7 (H) 09/04/2016   Lab Results  Component Value Date   CHOL 156 02/14/2017   HDL 49 02/14/2017   LDLCALC 57 02/14/2017   LDLDIRECT 58.0 09/04/2016   TRIG 251 (H) 02/14/2017   CHOLHDL 3.2 02/14/2017    Significant Diagnostic Results in last 30 days:  No results found.  Assessment/Plan 1. Atherosclerosis of native coronary artery of native heart without angina pectoris  Stable   2. Essential hypertension  Stable  Continue Losartan 50 mg po Q Day  3. Type 2 diabetes mellitus with stage 3 chronic kidney disease, without long-term current use of insulin (HCC)  Stable   4. CKD (chronic kidney disease) stage 3, GFR 30-59 ml/min  Stable  Renally adjust medications as appropriate  5. Chronic gout due to renal impairment without tophus, unspecified site  Stable  Continue colchicine 0.6 mg po Q Day  6. Hypothyroidism, unspecified type  Stable  Continue Levothyroxine 75 mcg po Q Day  7. Hyperlipidemia, unspecified hyperlipidemia type  Stable  Continue Lipitor 20 mg po Q Day  8. Gastroesophageal reflux disease without esophagitis  Stable  Continue Protonix 40 mg po Q Day  9. Osteoarthritis, unspecified osteoarthritis type, unspecified site  Stable   Continue APAP CR 650 mg po Q Day  10. Low vitamin D level  Stable   Continue Cholecalciferol 400 units po Q Day  11. Skin irritation  Stable  Duoderm patch- cut into 1/4. Apply 1 piece to the Right Lateral Ankle Q 5 days for protection from irritaiton  12. Vitamin  B12 deficiency.  Cyanocobalamin 2000 mcg p.o. daily times 14 days, then  Cyanocobalamin 1000 mcg p.o. Daily  Continue all medications as listed above unless otherwise noted  Family/ staff Communication:   Total Time:  Documentation:  Face to Face:  Family/Phone:   Labs/tests  ordered: Not due  Medication list reviewed and assessed for continued appropriateness. Monthly medication orders reviewed and signed.  Vikki Ports, NP-C Geriatrics [facility] [phone], [place] [address] Cell Phone (Mon-Fri 8am-5pm):  [phone] On Call:  [phone] & follow prompts after 5pm & weekends Office Phone:  [phone] Office Fax:  [phone]

## 2017-03-13 ENCOUNTER — Other Ambulatory Visit
Admission: RE | Admit: 2017-03-13 | Discharge: 2017-03-13 | Disposition: A | Discharge status: Home / Self Care | Payer: Medicare Other | Source: Skilled Nursing Facility | Attending: Gerontology | Admitting: Gerontology

## 2017-03-13 DIAGNOSIS — R410 Disorientation, unspecified: Secondary | ICD-10-CM | POA: Insufficient documentation

## 2017-03-13 LAB — URINALYSIS, COMPLETE (UACMP) WITH MICROSCOPIC
BILIRUBIN URINE: NEGATIVE
Bacteria, UA: NONE SEEN
GLUCOSE, UA: NEGATIVE mg/dL
HGB URINE DIPSTICK: NEGATIVE
Ketones, ur: NEGATIVE mg/dL
LEUKOCYTES UA: NEGATIVE
NITRITE: NEGATIVE
PROTEIN: NEGATIVE mg/dL
SPECIFIC GRAVITY, URINE: 1.019 (ref 1.005–1.030)
pH: 5 (ref 5.0–8.0)

## 2017-03-14 LAB — URINE CULTURE

## 2017-03-25 DIAGNOSIS — R238 Other skin changes: Secondary | ICD-10-CM | POA: Insufficient documentation

## 2017-03-25 DIAGNOSIS — E538 Deficiency of other specified B group vitamins: Secondary | ICD-10-CM | POA: Insufficient documentation

## 2017-03-28 ENCOUNTER — Encounter
Admission: RE | Admit: 2017-03-28 | Discharge: 2017-03-28 | Disposition: A | Discharge status: Home / Self Care | Payer: Medicare Other | Source: Ambulatory Visit | Attending: Internal Medicine | Admitting: Internal Medicine

## 2017-04-02 ENCOUNTER — Encounter: Payer: Self-pay | Admitting: Gerontology

## 2017-04-02 ENCOUNTER — Non-Acute Institutional Stay: Payer: Medicare Other | Admitting: Gerontology

## 2017-04-02 DIAGNOSIS — E785 Hyperlipidemia, unspecified: Secondary | ICD-10-CM

## 2017-04-02 DIAGNOSIS — K219 Gastro-esophageal reflux disease without esophagitis: Secondary | ICD-10-CM

## 2017-04-02 DIAGNOSIS — M1990 Unspecified osteoarthritis, unspecified site: Secondary | ICD-10-CM

## 2017-04-02 DIAGNOSIS — R7989 Other specified abnormal findings of blood chemistry: Secondary | ICD-10-CM

## 2017-04-02 DIAGNOSIS — E039 Hypothyroidism, unspecified: Secondary | ICD-10-CM

## 2017-04-02 DIAGNOSIS — M1A30X0 Chronic gout due to renal impairment, unspecified site, without tophus (tophi): Secondary | ICD-10-CM | POA: Diagnosis not present

## 2017-04-02 DIAGNOSIS — E1122 Type 2 diabetes mellitus with diabetic chronic kidney disease: Secondary | ICD-10-CM

## 2017-04-02 DIAGNOSIS — I10 Essential (primary) hypertension: Secondary | ICD-10-CM

## 2017-04-02 DIAGNOSIS — N183 Chronic kidney disease, stage 3 unspecified: Secondary | ICD-10-CM

## 2017-04-02 DIAGNOSIS — E538 Deficiency of other specified B group vitamins: Secondary | ICD-10-CM

## 2017-04-02 DIAGNOSIS — I2510 Atherosclerotic heart disease of native coronary artery without angina pectoris: Secondary | ICD-10-CM

## 2017-04-23 NOTE — Progress Notes (Signed)
Location:   [facility] Room Number: [phone] Place of Service:  ALF [phone]) Provider:  Toni Arthurs, NP-C  Leone Haven, MD  Patient Care Team: Leone Haven, MD as PCP - General (Family Medicine)  Extended Emergency Contact Information Primary Emergency Contact: [place] Phone: [phone] Relation: Niece Secondary Emergency Contact: Charolotte [place]ke [place] of [place] Phone: [phone] Relation: Nephew  Code Status:  DNR Goals of care: Advanced Directive information Advanced Directives 04/02/2017  Does Patient Have a Medical Advance Directive? Yes  Type of Advance Directive Out of facility DNR (pink MOST or yellow form)  Does patient want to make changes to medical advance directive? No - Patient declined  Copy of [place] in Chart? -     Chief Complaint  Patient presents with  . Medical Management of Chronic Issues    Routine Visit    HPI:  Pt is a 83 y.o. female seen today for medical management of chronic diseases.  Patient is a resident of the assisted living unit.  She is mobile on the unit with a walker.  But uses a wheelchair for longer distances.  She typically goes upstairs to the [facility] unit and sits with her sister during the day.  Patient is alert and oriented.  She is continent of bowel and bladder.  She does not socialize much with the other residents, as she is mostly with her sister.  Patient reports her appetite is good and usually eats 75-100% of meals.  She has not had any falls this month.  Patient reports she is feeling well.  Vital signs stable.  No other complaints.   Past Medical History:  Diagnosis Date  . Acute gouty arthritis 11/21/2016  . Aortic atherosclerosis ([place]) 09/04/2016  . Carotid artery disease ([place]) 11/21/2016  . Carotid stenosis 12/03/2015   Advanced heavily calcified atherosclerotic disease at both carotid bifurcations. Stenoses  measured at 70% affecting the right proximal ICA and 80% affecting the left proximal ICA.  Marland Kitchen Coronary atherosclerosis 09/04/2016  . Diverticulitis   . DM (diabetes mellitus), type 2 ([place]) 09/04/2016  . Gout   . HTN, goal below 140/90 11/21/2016  . Hyperlipidemia 06/20/2016  . Hypothyroidism, unspecified 11/21/2016  . Primary osteoarthritis 11/21/2016   involving multiple joints  . Stroke [facility])    Past Surgical History:  Procedure Laterality Date  . CATARACT EXTRACTION    . Finger crystal removal     right 3rd  . toe crystal removal     right 1st toe    No Known Allergies  Allergies as of 04/02/2017   No Known Allergies     Medication List        Accurate as of 04/02/17 11:59 PM. Always use your most recent med list.          atorvastatin 20 MG tablet Commonly known as:  LIPITOR Take 1 tablet (20 mg total) by mouth daily at 6 PM.   colchicine 0.6 MG tablet Take 0.6 mg by mouth daily.   cyanocobalamin 1000 MCG tablet Take 1,000 mcg by mouth daily.   levothyroxine 75 MCG tablet Commonly known as:  SYNTHROID, LEVOTHROID Take 1 tablet (75 mcg total) by mouth daily.   losartan 50 MG tablet Commonly known as:  COZAAR Take 50 mg by mouth daily.   MAPAP ARTHRITIS PAIN 650 MG CR tablet Generic drug:  acetaminophen Take 650 mg 2 (two) times daily by mouth.  pantoprazole 40 MG tablet Commonly known as:  PROTONIX Take 1 tablet (40 mg total) by mouth daily. Switch for any other PPI at similar dose and frequency   traZODone 50 MG tablet Commonly known as:  DESYREL Take 50 mg at bedtime by mouth.   Vitamin D3 2000 units capsule Take 2,000 Units by mouth daily.       Review of Systems  Constitutional: Negative for activity change, appetite change, chills, diaphoresis, fatigue and fever.  HENT: Negative for congestion, sneezing, sore throat, trouble swallowing and voice change.   Eyes: Negative.   Respiratory: Negative for apnea, cough, choking, chest tightness,  shortness of breath and wheezing.   Cardiovascular: Negative for chest pain, palpitations and leg swelling.  Gastrointestinal: Negative for abdominal distention, abdominal pain, constipation, diarrhea and nausea.  Genitourinary: Negative for difficulty urinating, dysuria, frequency and urgency.  Musculoskeletal: Positive for arthralgias (typical arthritis). Negative for back pain, gait problem and myalgias.  Skin: Negative for color change, pallor, rash and wound.  Neurological: Negative for dizziness, tremors, syncope, speech difficulty, weakness, numbness and headaches.  Psychiatric/Behavioral: Negative for agitation and behavioral problems.  All other systems reviewed and are negative.   Immunization History  Administered Date(s) Administered  . Influenza,inj,Quad PF,6+ Mos 01/28/2015  . Influenza-Unspecified 02/14/2016  . PPD Test 12/28/2016   Pertinent  Health Maintenance Due  Topic Date Due  . FOOT EXAM  03/27/1929  . OPHTHALMOLOGY EXAM  03[birth year]27[birth year]1931  . PNA vac Low Risk Adult (1 of 2 - PCV13) 03/26/1986  . INFLUENZA VACCINE  12/26/2016  . HEMOGLOBIN A1C  03/06/2017  . DEXA SCAN  05/29/2023 (Originally 03/26/1986)   Fall Risk  09/04/2016  Falls in the past year? No   Functional Status Survey:    Vitals:   04/02/17 1149  BP: 124/76  Pulse: 79  Resp: 18  Temp: 97.9 F (36.6 C)  TempSrc: Oral  SpO2: 97%  Weight: 139 lb (63 kg)  Height: 5\' 6"  (1.676 m)   Body mass index is 22.44 kg/m. Physical Exam  Constitutional: She is oriented to person, place, and time. Vital signs are normal. She appears well-developed and well-nourished. She is active and cooperative. She does not appear ill. No distress.  HENT:  Head: Normocephalic and atraumatic.  Mouth/Throat: Uvula is midline, oropharynx is clear and moist and mucous membranes are normal. Mucous membranes are not pale, not dry and not cyanotic.  Eyes: Conjunctivae, EOM and lids are normal. Pupils are equal, round, and  reactive to light.  Neck: Trachea normal, normal range of motion and full passive range of motion without pain. Neck supple. No JVD present. No tracheal deviation, no edema and no erythema present. No thyromegaly present.  Cardiovascular: Normal rate, regular rhythm, normal heart sounds, intact distal pulses and normal pulses. Exam reveals no gallop, no distant heart sounds and no friction rub.  No murmur heard. Pulses:      Dorsalis pedis pulses are 2+ on the right side, and 2+ on the left side.  No edema  Pulmonary/Chest: Effort normal and breath sounds normal. No accessory muscle usage. No respiratory distress. She has no decreased breath sounds. She has no wheezes. She has no rhonchi. She has no rales. She exhibits no tenderness.  Abdominal: Soft. Normal appearance and bowel sounds are normal. She exhibits no distension and no ascites. There is no tenderness.  Musculoskeletal: Normal range of motion. She exhibits no edema or tenderness.  Expected osteoarthritis, stiffness  Neurological: She is alert and oriented to person,  place, and time. She has normal strength.  Skin: Skin is warm, dry and intact. No rash noted. She is not diaphoretic. No cyanosis. No pallor. Nails show no clubbing.  Psychiatric: She has a normal mood and affect. Her speech is normal and behavior is normal. Judgment and thought content normal. Cognition and memory are normal.  Nursing note and vitals reviewed.   Labs reviewed: Recent Labs    10/05/16 1328 10/06/16 0406 02/14/17 0435  NA 138 139 140  K 4.4 4.2 5.1  CL 106 107 112*  CO2 24 26 23   GLUCOSE 104* 109* 120*  BUN 29* 28* 39*  CREATININE 1.11* 1.08* 1.13*  CALCIUM 9.2 8.4* 9.3  MG  --   --  1.9   Recent Labs    09/04/16 1522 10/05/16 1328 02/14/17 0435  AST 18 30 34  ALT 10 11* 23  ALKPHOS 95 84 144*  BILITOT 0.6 1.5* 0.6  PROT 6.9 6.9 6.6  ALBUMIN 3.6 3.5 3.3*   Recent Labs    10/05/16 1328  10/10/16 1207 10/25/16 1135 02/14/17 0435    WBC 5.9   < > 6.4 5.5 4.5  NEUTROABS 3.7  --   --  3.2 2.1  HGB 8.0*   < > 11.3* 12.0 11.3*  HCT 25.0*   < > 35.9* 37.4 33.5*  MCV 84.2   < > 86.7 88.7 90.4  PLT 191   < > 238.0 217.0 173   < > = values in this interval not displayed.   Lab Results  Component Value Date   TSH 0.647 02/14/2017   Lab Results  Component Value Date   HGBA1C 6.7 (H) 09/04/2016   Lab Results  Component Value Date   CHOL 156 02/14/2017   HDL 49 02/14/2017   LDLCALC 57 02/14/2017   LDLDIRECT 58.0 09/04/2016   TRIG 251 (H) 02/14/2017   CHOLHDL 3.2 02/14/2017    Significant Diagnostic Results in last 30 days:  No results found.  Assessment/Plan 1. Atherosclerosis of native coronary artery of native heart without angina pectoris  Stable   2. Essential hypertension  Stable  Continue Losartan 50 mg po Q Day  3. Type 2 diabetes mellitus with stage 3 chronic kidney disease, without long-term current use of insulin (HCC)  Stable   4. CKD (chronic kidney disease) stage 3, GFR 30-59 ml/min  Stable  Renally adjust medications as appropriate  5. Chronic gout due to renal impairment without tophus, unspecified site  Stable  Continue colchicine 0.6 mg po Q Day  6. Hypothyroidism, unspecified type  Stable  Continue Levothyroxine 75 mcg po Q Day  7. Hyperlipidemia, unspecified hyperlipidemia type  Stable  Continue Lipitor 20 mg po Q Day  8. Gastroesophageal reflux disease without esophagitis  Stable  Continue Protonix 40 mg po Q Day  9. Osteoarthritis, unspecified osteoarthritis type, unspecified site  Stable   Continue APAP CR 650 mg po Q Day  10. Low vitamin D level  Stable   Continue Cholecalciferol 400 units po Q Day  11. Skin irritation  Stable  Duoderm patch- cut into 1/4. Apply 1 piece to the Right Lateral Ankle Q 5 days for protection from irritaiton  12. Vitamin B12 deficiency.  Stable   Continue Cyanocobalamin 1000 mcg p.o. Daily  Continue all  medications as listed   Family/ staff Communication:   Total Time:  Documentation:  Face to Face:  Family/Phone:   Labs/tests ordered: Not due  Medication list reviewed and assessed for continued appropriateness.  Monthly medication orders reviewed and signed.  Vikki Ports, NP-C Geriatrics [facility] [phone], [place] [address] Cell Phone (Mon-Fri 8am-5pm):  [phone] On Call:  [phone] & follow prompts after 5pm & weekends Office Phone:  [phone] Office Fax:  [phone]

## 2017-04-27 ENCOUNTER — Encounter
Admission: RE | Admit: 2017-04-27 | Discharge: 2017-04-27 | Disposition: A | Discharge status: Home / Self Care | Payer: Medicare Other | Source: Ambulatory Visit | Attending: Internal Medicine | Admitting: Internal Medicine

## 2017-05-07 DIAGNOSIS — E034 Atrophy of thyroid (acquired): Secondary | ICD-10-CM | POA: Diagnosis not present

## 2017-05-07 DIAGNOSIS — Z0000 Encounter for general adult medical examination without abnormal findings: Secondary | ICD-10-CM | POA: Diagnosis not present

## 2017-05-07 DIAGNOSIS — I779 Disorder of arteries and arterioles, unspecified: Secondary | ICD-10-CM | POA: Diagnosis not present

## 2017-05-07 DIAGNOSIS — I10 Essential (primary) hypertension: Secondary | ICD-10-CM | POA: Diagnosis not present

## 2017-05-08 ENCOUNTER — Encounter: Payer: Self-pay | Admitting: Gerontology

## 2017-05-08 ENCOUNTER — Non-Acute Institutional Stay: Payer: Medicare Other | Admitting: Gerontology

## 2017-05-08 DIAGNOSIS — I10 Essential (primary) hypertension: Secondary | ICD-10-CM | POA: Diagnosis not present

## 2017-05-08 DIAGNOSIS — I6523 Occlusion and stenosis of bilateral carotid arteries: Secondary | ICD-10-CM | POA: Diagnosis not present

## 2017-05-08 DIAGNOSIS — I2510 Atherosclerotic heart disease of native coronary artery without angina pectoris: Secondary | ICD-10-CM | POA: Diagnosis not present

## 2017-05-28 ENCOUNTER — Encounter
Admission: RE | Admit: 2017-05-28 | Discharge: 2017-05-28 | Disposition: A | Discharge status: Home / Self Care | Payer: Medicare Other | Source: Ambulatory Visit | Attending: Internal Medicine | Admitting: Internal Medicine

## 2017-06-04 ENCOUNTER — Other Ambulatory Visit
Admission: RE | Admit: 2017-06-04 | Discharge: 2017-06-04 | Disposition: A | Discharge status: Home / Self Care | Payer: Medicare Other | Source: Ambulatory Visit | Attending: Internal Medicine | Admitting: Internal Medicine

## 2017-06-04 DIAGNOSIS — R410 Disorientation, unspecified: Secondary | ICD-10-CM | POA: Insufficient documentation

## 2017-06-04 LAB — URINALYSIS, COMPLETE (UACMP) WITH MICROSCOPIC
BILIRUBIN URINE: NEGATIVE
Bacteria, UA: NONE SEEN
GLUCOSE, UA: NEGATIVE mg/dL
HGB URINE DIPSTICK: NEGATIVE
Ketones, ur: NEGATIVE mg/dL
LEUKOCYTES UA: NEGATIVE
NITRITE: NEGATIVE
PROTEIN: NEGATIVE mg/dL
Specific Gravity, Urine: 1.014 (ref 1.005–1.030)
pH: 5 (ref 5.0–8.0)

## 2017-06-05 LAB — URINE CULTURE

## 2017-06-10 ENCOUNTER — Other Ambulatory Visit
Admission: RE | Admit: 2017-06-10 | Discharge: 2017-06-10 | Disposition: A | Discharge status: Home / Self Care | Payer: Medicare Other | Source: Other Acute Inpatient Hospital | Attending: Internal Medicine | Admitting: Internal Medicine

## 2017-06-10 DIAGNOSIS — R410 Disorientation, unspecified: Secondary | ICD-10-CM | POA: Insufficient documentation

## 2017-06-10 LAB — URINALYSIS, COMPLETE (UACMP) WITH MICROSCOPIC
BACTERIA UA: NONE SEEN
BILIRUBIN URINE: NEGATIVE
Glucose, UA: NEGATIVE mg/dL
HGB URINE DIPSTICK: NEGATIVE
Ketones, ur: NEGATIVE mg/dL
Leukocytes, UA: NEGATIVE
NITRITE: NEGATIVE
PROTEIN: NEGATIVE mg/dL
SPECIFIC GRAVITY, URINE: 1.015 (ref 1.005–1.030)
pH: 5 (ref 5.0–8.0)

## 2017-06-12 ENCOUNTER — Non-Acute Institutional Stay: Payer: Medicare Other | Admitting: Gerontology

## 2017-06-12 ENCOUNTER — Encounter: Payer: Self-pay | Admitting: Gerontology

## 2017-06-12 DIAGNOSIS — K219 Gastro-esophageal reflux disease without esophagitis: Secondary | ICD-10-CM | POA: Diagnosis not present

## 2017-06-12 DIAGNOSIS — N183 Chronic kidney disease, stage 3 unspecified: Secondary | ICD-10-CM

## 2017-06-12 DIAGNOSIS — E039 Hypothyroidism, unspecified: Secondary | ICD-10-CM | POA: Diagnosis not present

## 2017-06-12 DIAGNOSIS — E1122 Type 2 diabetes mellitus with diabetic chronic kidney disease: Secondary | ICD-10-CM

## 2017-06-12 LAB — URINE CULTURE

## 2017-06-13 ENCOUNTER — Other Ambulatory Visit
Admission: RE | Admit: 2017-06-13 | Discharge: 2017-06-13 | Disposition: A | Discharge status: Home / Self Care | Payer: Medicare Other | Source: Ambulatory Visit | Attending: Internal Medicine | Admitting: Internal Medicine

## 2017-06-13 DIAGNOSIS — R410 Disorientation, unspecified: Secondary | ICD-10-CM | POA: Insufficient documentation

## 2017-06-13 LAB — URINALYSIS, COMPLETE (UACMP) WITH MICROSCOPIC
BILIRUBIN URINE: NEGATIVE
GLUCOSE, UA: 50 mg/dL — AB
HGB URINE DIPSTICK: NEGATIVE
Ketones, ur: NEGATIVE mg/dL
Leukocytes, UA: NEGATIVE
NITRITE: NEGATIVE
PROTEIN: NEGATIVE mg/dL
Specific Gravity, Urine: 1.017 (ref 1.005–1.030)
pH: 5 (ref 5.0–8.0)

## 2017-06-14 LAB — URINE CULTURE: Culture: <10000 — AB

## 2017-06-14 NOTE — Assessment & Plan Note (Signed)
Stable. No medicinal treatment or monitoring. On NCS diet. No episodes of hypoglycemia. Renally adjust medications for the CKD.

## 2017-06-14 NOTE — Assessment & Plan Note (Signed)
Stable. Continue Synthroid 75 mcg. Recheck TSH with next routine lab draws in a few months.

## 2017-06-14 NOTE — Assessment & Plan Note (Signed)
Stable. No recent cardiac events.

## 2017-06-14 NOTE — Assessment & Plan Note (Signed)
Stable. No recent recurrence of symptoms. Symptoms controlled with Protonix 40 mg Q Day

## 2017-06-14 NOTE — Assessment & Plan Note (Signed)
Stable. No recent cardiac events. Pt does not receive medicinal treatment at this time d/t advanced age.

## 2017-06-14 NOTE — Progress Notes (Signed)
Location[facility] Room Number: 638L Place of Service:  ALF [phone]) Provider:  Toni Arthurs, NP-C  Leone Haven, MD  Patient Care Team: Leone Haven, MD as PCP - General (Family Medicine)  Extended Emergency Contact Information Primary Emergency Contact: [place] of [place] Phone: [phone] Relation: Niece Secondary Emergency Contact: Charolotte [place]ke [place] of [place] Phone: [phone] Relation: Nephew  Code Status:  DNR Goals of care: Advanced Directive information Advanced Directives 06/12/2017  Does Patient Have a Medical Advance Directive? Yes  Type of Advance Directive Out of facility DNR (pink MOST or yellow form)  Does patient want to make changes to medical advance directive? No - Patient declined  Copy of [place] in Chart? -  Pre-existing out of facility DNR order (yellow form or pink MOST form) Yellow form placed in chart (order not valid for inpatient use)     Chief Complaint  Patient presents with  . Medical Management of Chronic Issues    Routine Visit    HPI:  Pt is a 84 y.o. female seen today for medical management of chronic diseases.    Gastroesophageal reflux disease without esophagitis Stable. No recent recurrence of symptoms. Symptoms controlled with Protonix 40 mg Q Day  Type 2 diabetes mellitus with diabetic chronic kidney disease ([place]) Stable. No medicinal treatment or monitoring. On NCS diet. No episodes of hypoglycemia. Renally adjust medications for the CKD.  Hypothyroidism Stable. Continue Synthroid 75 mcg. Recheck TSH with next routine lab draws in a few months.     Past Medical History:  Diagnosis Date  . Acute gouty arthritis 11/21/2016  . Aortic atherosclerosis ([place]) 09/04/2016  . Carotid artery disease ([place]) 11/21/2016  . Carotid stenosis 12/03/2015   Advanced heavily calcified atherosclerotic disease at both carotid bifurcations. Stenoses measured at 70%  affecting the right proximal ICA and 80% affecting the left proximal ICA.  Marland Kitchen Coronary atherosclerosis 09/04/2016  . Diverticulitis   . DM (diabetes mellitus), type 2 ([place]) 09/04/2016  . Gout   . HTN, goal below 140/90 11/21/2016  . Hyperlipidemia 06/20/2016  . Hypothyroidism, unspecified 11/21/2016  . Primary osteoarthritis 11/21/2016   involving multiple joints  . Stroke [facility])    Past Surgical History:  Procedure Laterality Date  . CATARACT EXTRACTION    . Finger crystal removal     right 3rd  . toe crystal removal     right 1st toe    No Known Allergies  Allergies as of 06/12/2017   No Known Allergies     Medication List        Accurate as of 06/12/17 11:59 PM. Always use your most recent med list.          atorvastatin 20 MG tablet Commonly known as:  LIPITOR Take 1 tablet (20 mg total) by mouth daily at 6 PM.   colchicine 0.6 MG tablet Take 0.6 mg by mouth daily.   cyanocobalamin 1000 MCG tablet Take 1,000 mcg by mouth daily.   levothyroxine 75 MCG tablet Commonly known as:  SYNTHROID, LEVOTHROID Take 75 mcg by mouth daily before breakfast.   losartan 50 MG tablet Commonly known as:  COZAAR Take 50 mg by mouth daily.   MAPAP ARTHRITIS PAIN 650 MG CR tablet Generic drug:  acetaminophen Take 650 mg 2 (two) times daily by mouth.   pantoprazole 40 MG tablet Commonly known as:  PROTONIX Take 1 tablet (40 mg total) by mouth daily. Switch for any  other PPI at similar dose and frequency   traZODone 50 MG tablet Commonly known as:  DESYREL Take 50 mg at bedtime by mouth.   trolamine salicylate 10 % cream Commonly known as:  ASPERCREME Apply thin film topically to right ankle and 2nd right toe 3 times a day for pain   Vitamin D3 2000 units capsule Take 2,000 Units by mouth daily.       Review of Systems  Constitutional: Negative for activity change, appetite change, chills, diaphoresis and fever.  HENT: Negative for congestion, mouth sores,  nosebleeds, postnasal drip, sneezing, sore throat, trouble swallowing and voice change.   Respiratory: Negative for apnea, cough, choking, chest tightness, shortness of breath and wheezing.   Cardiovascular: Negative for chest pain, palpitations and leg swelling.  Gastrointestinal: Negative for abdominal distention, abdominal pain, constipation, diarrhea and nausea.  Genitourinary: Negative for difficulty urinating, dysuria, frequency and urgency.  Musculoskeletal: Positive for arthralgias (typical arthritis). Negative for back pain, gait problem and myalgias.  Skin: Negative for color change, pallor, rash and wound.  Neurological: Negative for dizziness, tremors, syncope, speech difficulty, weakness, numbness and headaches.  Psychiatric/Behavioral: Negative for agitation and behavioral problems.  All other systems reviewed and are negative.   Immunization History  Administered Date(s) Administered  . Influenza,inj,Quad PF,6+ Mos 01/28/2015  . Influenza-Unspecified 02/14/2016  . PPD Test 12/28/2016   Pertinent  Health Maintenance Due  Topic Date Due  . FOOT EXAM  03/27/1929  . OPHTHALMOLOGY EXAM  03[birth year]31  . PNA vac Low Risk Adult (1 of 2 - PCV13) 03/26/1986  . INFLUENZA VACCINE  12/26/2016  . HEMOGLOBIN A1C  03/06/2017  . DEXA SCAN  05/29/2023 (Originally 03/26/1986)   Fall Risk  09/04/2016  Falls in the past year? No   Functional Status Survey:    Vitals:   06/12/17 1418  BP: 116/76  Pulse: 66  Resp: 18  Temp: 97.6 F (36.4 C)  TempSrc: Oral  SpO2: 97%  Weight: 138 lb 11.2 oz (62.9 kg)  Height: 5\' 6"  (1.676 m)   Body mass index is 22.39 kg/m. Physical Exam  Constitutional: She is oriented to person, place, and time. Vital signs are normal. She appears well-developed and well-nourished. She is active and cooperative. She does not appear ill. No distress.  HENT:  Head: Normocephalic and atraumatic.  Mouth/Throat: Uvula is midline, oropharynx is clear and moist and  mucous membranes are normal. Mucous membranes are not pale, not dry and not cyanotic.  Eyes: Conjunctivae, EOM and lids are normal. Pupils are equal, round, and reactive to light.  Neck: Trachea normal, normal range of motion and full passive range of motion without pain. Neck supple. No JVD present. No tracheal deviation, no edema and no erythema present. No thyromegaly present.  Cardiovascular: Normal rate, regular rhythm, intact distal pulses and normal pulses. Exam reveals no gallop, no distant heart sounds and no friction rub.  Murmur heard. Pulses:      Dorsalis pedis pulses are 2+ on the right side, and 2+ on the left side.  No edema  Pulmonary/Chest: Effort normal and breath sounds normal. No accessory muscle usage. No respiratory distress. She has no decreased breath sounds. She has no wheezes. She has no rhonchi. She has no rales. She exhibits no tenderness.  Abdominal: Soft. Normal appearance and bowel sounds are normal. She exhibits no distension and no ascites. There is no tenderness.  Musculoskeletal: Normal range of motion. She exhibits no edema.       Right ankle: Tenderness.  Expected osteoarthritis, stiffness; Bilateral Calves soft, supple. Negative Homan's Sign. B- pedal pulses equal; chronic right ankle pain  Neurological: She is alert and oriented to person, place, and time. She has normal strength.  Skin: Skin is warm, dry and intact. She is not diaphoretic. No cyanosis. No pallor. Nails show no clubbing.  Psychiatric: She has a normal mood and affect. Her speech is normal and behavior is normal. Judgment and thought content normal. Cognition and memory are normal.  Nursing note and vitals reviewed.   Labs reviewed: Recent Labs    10/05/16 1328 10/06/16 0406 02/14/17 0435  NA 138 139 140  K 4.4 4.2 5.1  CL 106 107 112*  CO2 24 26 23   GLUCOSE 104* 109* 120*  BUN 29* 28* 39*  CREATININE 1.11* 1.08* 1.13*  CALCIUM 9.2 8.4* 9.3  MG  --   --  1.9   Recent Labs     09/04/16 1522 10/05/16 1328 02/14/17 0435  AST 18 30 34  ALT 10 11* 23  ALKPHOS 95 84 144*  BILITOT 0.6 1.5* 0.6  PROT 6.9 6.9 6.6  ALBUMIN 3.6 3.5 3.3*   Recent Labs    10/05/16 1328  10/10/16 1207 10/25/16 1135 02/14/17 0435  WBC 5.9   < > 6.4 5.5 4.5  NEUTROABS 3.7  --   --  3.2 2.1  HGB 8.0*   < > 11.3* 12.0 11.3*  HCT 25.0*   < > 35.9* 37.4 33.5*  MCV 84.2   < > 86.7 88.7 90.4  PLT 191   < > 238.0 217.0 173   < > = values in this interval not displayed.   Lab Results  Component Value Date   TSH 0.647 02/14/2017   Lab Results  Component Value Date   HGBA1C 6.7 (H) 09/04/2016   Lab Results  Component Value Date   CHOL 156 02/14/2017   HDL 49 02/14/2017   LDLCALC 57 02/14/2017   LDLDIRECT 58.0 09/04/2016   TRIG 251 (H) 02/14/2017   CHOLHDL 3.2 02/14/2017    Significant Diagnostic Results in last 30 days:  No results found.  Assessment/Plan [place] was seen today for medical management of chronic issues.  Diagnoses and all orders for this visit:  Gastroesophageal reflux disease without esophagitis  Type 2 diabetes mellitus with stage 3 chronic kidney disease, without long-term current use of insulin (HCC)  Hypothyroidism, unspecified type   above conditions stable  Continue current medication regimen  Renally adjust any medications  Conservative treatments d/t age  Family/ staff Communication:   Total Time:  Documentation:  Face to Face:  Family/Phone:   Labs/tests ordered:  Not due  Medication list reviewed and assessed for continued appropriateness. Monthly medication orders reviewed and signed.  Vikki Ports, NP-C Geriatrics [facility] [phone], [place] [address] Cell Phone (Mon-Fri 8am-5pm):  [phone] On Call:  [phone] & follow prompts after 5pm & weekends Office Phone:  [phone] Office Fax:  [phone]

## 2017-06-14 NOTE — Assessment & Plan Note (Signed)
Stable. No recent episodes of hypotension. Symptoms controlled with Losartan.

## 2017-06-14 NOTE — Progress Notes (Signed)
Location:    Nursing Home Room Number: 270J Place of Service:  ALF [phone]) Provider:  Toni Arthurs, NP-C  Leone Haven, MD  Patient Care Team: Leone Haven, MD as PCP - General (Family Medicine)  Extended Emergency Contact Information Primary Emergency Contact: [place] of [place] Phone: [phone] Relation: Niece Secondary Emergency Contact: Charolotte [place] of [place] Phone: [phone] Relation: Nephew  Code Status:  DNR Goals of care: Advanced Directive information Advanced Directives 06/12/2017  Does Patient Have a Medical Advance Directive? Yes  Type of Advance Directive Out of facility DNR (pink MOST or yellow form)  Does patient want to make changes to medical advance directive? No - Patient declined  Copy of [place] in Chart? -  Pre-existing out of facility DNR order (yellow form or pink MOST form) Yellow form placed in chart (order not valid for inpatient use)     No chief complaint on file.   HPI:  Pt is a 84 y.o. female seen today for medical management of chronic diseases.    Essential hypertension Stable. No recent episodes of hypotension. Symptoms controlled with Losartan.   Carotid stenosis Stable. No recent cardiac events. Pt does not receive medicinal treatment at this time d/t advanced age.   Atherosclerotic heart disease of native coronary artery without angina pectoris Stable. No recent cardiac events.     Past Medical History:  Diagnosis Date  . Acute gouty arthritis 11/21/2016  . Aortic atherosclerosis ([place]) 09/04/2016  . Carotid artery disease ([place]) 11/21/2016  . Carotid stenosis 12/03/2015   Advanced heavily calcified atherosclerotic disease at both carotid bifurcations. Stenoses measured at 70% affecting the right proximal ICA and 80% affecting the left proximal ICA.  Marland Kitchen Coronary atherosclerosis 09/04/2016  . Diverticulitis   . DM (diabetes mellitus), type 2 ([place])  09/04/2016  . Gout   . HTN, goal below 140/90 11/21/2016  . Hyperlipidemia 06/20/2016  . Hypothyroidism, unspecified 11/21/2016  . Primary osteoarthritis 11/21/2016   involving multiple joints  . Stroke [facility])    Past Surgical History:  Procedure Laterality Date  . CATARACT EXTRACTION    . Finger crystal removal     right 3rd  . toe crystal removal     right 1st toe    No Known Allergies  Allergies as of 05/08/2017   No Known Allergies     Medication List        Accurate as of 05/08/17 11:59 PM. Always use your most recent med list.          atorvastatin 20 MG tablet Commonly known as:  LIPITOR Take 1 tablet (20 mg total) by mouth daily at 6 PM.   colchicine 0.6 MG tablet Take 0.6 mg by mouth daily.   cyanocobalamin 1000 MCG tablet Take 1,000 mcg by mouth daily.   losartan 50 MG tablet Commonly known as:  COZAAR Take 50 mg by mouth daily.   MAPAP ARTHRITIS PAIN 650 MG CR tablet Generic drug:  acetaminophen Take 650 mg 2 (two) times daily by mouth.   pantoprazole 40 MG tablet Commonly known as:  PROTONIX Take 1 tablet (40 mg total) by mouth daily. Switch for any other PPI at similar dose and frequency   traZODone 50 MG tablet Commonly known as:  DESYREL Take 50 mg at bedtime by mouth.   Vitamin D3 2000 units capsule Take 2,000 Units by mouth daily.       Review of Systems  Constitutional: Negative for  activity change, appetite change, chills, diaphoresis and fever.  HENT: Negative for congestion, mouth sores, nosebleeds, postnasal drip, sneezing, sore throat, trouble swallowing and voice change.   Respiratory: Negative for apnea, cough, choking, chest tightness, shortness of breath and wheezing.   Cardiovascular: Negative for chest pain, palpitations and leg swelling.  Gastrointestinal: Negative for abdominal distention, abdominal pain, constipation, diarrhea and nausea.  Genitourinary: Negative for difficulty urinating, dysuria, frequency and  urgency.  Musculoskeletal: Positive for arthralgias (typical arthritis). Negative for back pain, gait problem and myalgias.  Skin: Negative for color change, pallor, rash and wound.  Neurological: Negative for dizziness, tremors, syncope, speech difficulty, weakness, numbness and headaches.  Psychiatric/Behavioral: Negative for agitation and behavioral problems.  All other systems reviewed and are negative.   Immunization History  Administered Date(s) Administered  . Influenza,inj,Quad PF,6+ Mos 01/28/2015  . Influenza-Unspecified 02/14/2016  . PPD Test 12/28/2016   Pertinent  Health Maintenance Due  Topic Date Due  . FOOT EXAM  03/27/1929  . OPHTHALMOLOGY EXAM  03/27/1931  . PNA vac Low Risk Adult (1 of 2 - PCV13) 03/26/1986  . INFLUENZA VACCINE  12/26/2016  . HEMOGLOBIN A1C  03/06/2017  . DEXA SCAN  05/29/2023 (Originally 03/26/1986)   Fall Risk  09/04/2016  Falls in the past year? No   Functional Status Survey:    Vitals:   05/08/17 1642  Weight: 139 lb (63 kg)  Height: 5\' 6"  (1.676 m)   Body mass index is 22.44 kg/m. Physical Exam  Constitutional: She is oriented to person, place, and time. Vital signs are normal. She appears well-developed and well-nourished. She is active and cooperative. She does not appear ill. No distress.  HENT:  Head: Normocephalic and atraumatic.  Mouth/Throat: Uvula is midline, oropharynx is clear and moist and mucous membranes are normal. Mucous membranes are not pale, not dry and not cyanotic.  Eyes: Conjunctivae, EOM and lids are normal. Pupils are equal, round, and reactive to light.  Neck: Trachea normal, normal range of motion and full passive range of motion without pain. Neck supple. No JVD present. No tracheal deviation, no edema and no erythema present. No thyromegaly present.  Cardiovascular: Normal rate, regular rhythm, normal heart sounds, intact distal pulses and normal pulses. Exam reveals no gallop, no distant heart sounds and no  friction rub.  No murmur heard. Pulses:      Dorsalis pedis pulses are 2+ on the right side, and 2+ on the left side.  No edema  Pulmonary/Chest: Effort normal and breath sounds normal. No accessory muscle usage. No respiratory distress. She has no decreased breath sounds. She has no wheezes. She has no rhonchi. She has no rales. She exhibits no tenderness.  Abdominal: Soft. Normal appearance and bowel sounds are normal. She exhibits no distension and no ascites. There is no tenderness.  Musculoskeletal: Normal range of motion. She exhibits no edema.       Right ankle: Tenderness.  Expected osteoarthritis, stiffness; Bilateral Calves soft, supple. Negative Homan's Sign. B- pedal pulses equal; Chronic right lateral ankle pain  Neurological: She is alert and oriented to person, place, and time. She has normal strength.  Skin: Skin is warm, dry and intact. She is not diaphoretic. No cyanosis. No pallor. Nails show no clubbing.  Psychiatric: She has a normal mood and affect. Her speech is normal and behavior is normal. Judgment and thought content normal. Cognition and memory are normal.  Nursing note and vitals reviewed.   Labs reviewed: Recent Labs    10/05/16  1328 10/06/16 0406 02/14/17 0435  NA 138 139 140  K 4.4 4.2 5.1  CL 106 107 112*  CO2 24 26 23   GLUCOSE 104* 109* 120*  BUN 29* 28* 39*  CREATININE 1.11* 1.08* 1.13*  CALCIUM 9.2 8.4* 9.3  MG  --   --  1.9   Recent Labs    09/04/16 1522 10/05/16 1328 02/14/17 0435  AST 18 30 34  ALT 10 11* 23  ALKPHOS 95 84 144*  BILITOT 0.6 1.5* 0.6  PROT 6.9 6.9 6.6  ALBUMIN 3.6 3.5 3.3*   Recent Labs    10/05/16 1328  10/10/16 1207 10/25/16 1135 02/14/17 0435  WBC 5.9   < > 6.4 5.5 4.5  NEUTROABS 3.7  --   --  3.2 2.1  HGB 8.0*   < > 11.3* 12.0 11.3*  HCT 25.0*   < > 35.9* 37.4 33.5*  MCV 84.2   < > 86.7 88.7 90.4  PLT 191   < > 238.0 217.0 173   < > = values in this interval not displayed.   Lab Results  Component  Value Date   TSH 0.647 02/14/2017   Lab Results  Component Value Date   HGBA1C 6.7 (H) 09/04/2016   Lab Results  Component Value Date   CHOL 156 02/14/2017   HDL 49 02/14/2017   LDLCALC 57 02/14/2017   LDLDIRECT 58.0 09/04/2016   TRIG 251 (H) 02/14/2017   CHOLHDL 3.2 02/14/2017    Significant Diagnostic Results in last 30 days:  No results found.  Assessment/Plan Diagnoses and all orders for this visit:  Essential hypertension  Bilateral carotid artery stenosis  Atherosclerosis of native coronary artery of native heart without angina pectoris   above conditions stable  Continue current medication regimen  Follow up with cardiologist as needed  Follow up with Vascular MD as needed  No prophylactic treatment warranted d/t pt's preference and advanced age  Family/ staff Communication:   Total Time:  Documentation:  Face to Face:  Family/Phone:   Labs/tests ordered:  Not due  Medication list reviewed and assessed for continued appropriateness. Monthly medication orders reviewed and signed.  Vikki Ports, NP-C Geriatrics [facility] [phone], [place] [address] Cell Phone (Mon-Fri 8am-5pm):  [phone] On Call:  [phone] & follow prompts after 5pm & weekends Office Phone:  [phone] Office Fax:  [phone]

## 2017-06-28 ENCOUNTER — Encounter
Admission: RE | Admit: 2017-06-28 | Discharge: 2017-06-28 | Disposition: A | Discharge status: Home / Self Care | Payer: Medicare Other | Source: Ambulatory Visit | Attending: Internal Medicine | Admitting: Internal Medicine

## 2017-07-09 ENCOUNTER — Non-Acute Institutional Stay (SKILLED_NURSING_FACILITY): Payer: Medicare Other | Admitting: Gerontology

## 2017-07-09 DIAGNOSIS — S61259A Open bite of unspecified finger without damage to nail, initial encounter: Secondary | ICD-10-CM

## 2017-07-09 DIAGNOSIS — L089 Local infection of the skin and subcutaneous tissue, unspecified: Secondary | ICD-10-CM

## 2017-07-09 DIAGNOSIS — W540XXA Bitten by dog, initial encounter: Secondary | ICD-10-CM

## 2017-07-10 ENCOUNTER — Encounter: Payer: Self-pay | Admitting: Gerontology

## 2017-07-10 NOTE — Progress Notes (Signed)
Location:   [place] [facility] [place] Room Number: [phone] Place of Service:  ALF [phone]) Provider:  Toni Arthurs, NP-C  Leone Haven, MD  Patient Care Team: Leone Haven, MD as PCP - General (Family Medicine)  Extended Emergency Contact Information Primary Emergency Contact: [place] of [place] Phone: [phone] Relation: Niece Secondary Emergency Contact: Charolotte [place]ke [place] of [place] Phone: [phone] Relation: Nephew  Code Status:  DNR Goals of care: Advanced Directive information Advanced Directives 07/09/2017  Does Patient Have a Medical Advance Directive? Yes  Type of Advance Directive Out of facility DNR (pink MOST or yellow form)  Does patient want to make changes to medical advance directive? No - Patient declined  Copy of [place] in Chart? -  Pre-existing out of facility DNR order (yellow form or pink MOST form) Yellow form placed in chart (order not valid for inpatient use)     Chief Complaint  Patient presents with  . Acute Visit    Evaluate dogbite/scratch to finger    HPI:  Pt is a 84 y.o. female seen today for an acute visit for evaluation of infection of the finger. I was notified late Saturday evening about wound. Pt reports "several days ago" a visitor had brought their dog to visit. Dog was on [place]ora's lap and "nipped" at her hand. She was first telling staff that ite wound was a scratch, but admitted to me it was a bite (scratched the skin with its teeth.) Colene reports the dog is up-to-date on vaccines. Antibiotics were started over the weekend, Tetanus shot was given and Epsom salt soaks were started. Pt reports today, the skin looks better. She reports the pain is improved and so has the ROM. No drainage. No redness or warmth and only mild swelling to the finger. Pt is afebrile. VSS. No other complaints.     Past Medical History:  Diagnosis Date  . Acute gouty  arthritis 11/21/2016  . Aortic atherosclerosis ([place]) 09/04/2016  . Carotid artery disease ([place]) 11/21/2016  . Carotid stenosis 12/03/2015   Advanced heavily calcified atherosclerotic disease at both carotid bifurcations. Stenoses measured at 70% affecting the right proximal ICA and 80% affecting the left proximal ICA.  Marland Kitchen Coronary atherosclerosis 09/04/2016  . Diverticulitis   . DM (diabetes mellitus), type 2 ([place]) 09/04/2016  . Gout   . HTN, goal below 140/90 11/21/2016  . Hyperlipidemia 06/20/2016  . Hypothyroidism, unspecified 11/21/2016  . Primary osteoarthritis 11/21/2016   involving multiple joints  . Stroke [facility])    Past Surgical History:  Procedure Laterality Date  . CATARACT EXTRACTION    . Finger crystal removal     right 3rd  . toe crystal removal     right 1st toe    No Known Allergies  Allergies as of 07/09/2017   No Known Allergies     Medication List        Accurate as of 07/09/17 11:59 PM. Always use your most recent med list.          amoxicillin-clavulanate 875-125 MG tablet Commonly known as:  AUGMENTIN Take 1 tablet by mouth every 12 (twelve) hours.   atorvastatin 20 MG tablet Commonly known as:  LIPITOR Take 1 tablet (20 mg total) by mouth daily at 6 PM.   colchicine 0.6 MG tablet Take 0.6 mg by mouth daily.   cyanocobalamin 1000 MCG tablet Take 1,000 mcg by mouth daily.   levothyroxine 75 MCG tablet  Commonly known as:  SYNTHROID, LEVOTHROID Take 75 mcg by mouth daily before breakfast.   losartan 50 MG tablet Commonly known as:  COZAAR Take 50 mg by mouth daily.   MAPAP ARTHRITIS PAIN 650 MG CR tablet Generic drug:  acetaminophen Take 650 mg 2 (two) times daily by mouth.   pantoprazole 40 MG tablet Commonly known as:  PROTONIX Take 1 tablet (40 mg total) by mouth daily. Switch for any other PPI at similar dose and frequency   traZODone 50 MG tablet Commonly known as:  DESYREL Take 50 mg at bedtime by mouth.   trolamine salicylate 10  % cream Commonly known as:  ASPERCREME Apply thin film topically to right ankle and 2nd right toe 3 times a day for pain   UNABLE TO FIND Med Name: Epsom salt crystals 100% : dissolve 1/4 cup in very warm water in basin. Soak hand until water cools or at least 20 minutes 2 times a day.   Vitamin D3 2000 units capsule Take 2,000 Units by mouth daily.       Review of Systems  Constitutional: Negative for activity change, appetite change, chills, diaphoresis and fever.  HENT: Negative for congestion, mouth sores, nosebleeds, postnasal drip, sneezing, sore throat, trouble swallowing and voice change.   Respiratory: Negative for apnea, cough, choking, chest tightness, shortness of breath and wheezing.   Cardiovascular: Negative for chest pain, palpitations and leg swelling.  Gastrointestinal: Negative for abdominal distention, abdominal pain, constipation, diarrhea and nausea.  Genitourinary: Negative for difficulty urinating, dysuria, frequency and urgency.  Musculoskeletal: Positive for arthralgias (typical arthritis). Negative for back pain, gait problem and myalgias.  Skin: Positive for wound. Negative for color change, pallor and rash.  Neurological: Negative for dizziness, tremors, syncope, speech difficulty, weakness, numbness and headaches.  Psychiatric/Behavioral: Negative for agitation and behavioral problems.  All other systems reviewed and are negative.   Immunization History  Administered Date(s) Administered  . Influenza,inj,Quad PF,6+ Mos 01/28/2015  . Influenza-Unspecified 02/14/2016  . PPD Test 12/28/2016   Pertinent  Health Maintenance Due  Topic Date Due  . FOOT EXAM  03[birth year]1931  . OPHTHALMOLOGY EXAM  03[birth year]31  . PNA vac Low Risk Adult (1 of 2 - PCV13) 03/26/1986  . INFLUENZA VACCINE  12/26/2016  . HEMOGLOBIN A1C  03/06/2017  . DEXA SCAN  05/29/2023 (Originally 03/26/1986)   Fall Risk  09/04/2016  Falls in the past year? No   Functional Status Survey:     Vitals:   07/09/17 1438  BP: (!) 145/88  Pulse: 77  Resp: 20  Temp: (!) 97 F (36.1 C)  TempSrc: Oral  SpO2: 98%  Weight: 134 lb 9.6 oz (61.1 kg)  Height: 5\' 6"  (1.676 m)   Body mass index is 21.73 kg/m. Physical Exam  Constitutional: She is oriented to person, place, and time. Vital signs are normal. She appears well-developed and well-nourished. She is active and cooperative. She does not appear ill. No distress.  HENT:  Head: Normocephalic and atraumatic.  Mouth/Throat: Uvula is midline, oropharynx is clear and moist and mucous membranes are normal. Mucous membranes are not pale, not dry and not cyanotic.  Eyes: Conjunctivae, EOM and lids are normal. Pupils are equal, round, and reactive to light.  Neck: Trachea normal, normal range of motion and full passive range of motion without pain. Neck supple. No JVD present. No tracheal deviation, no edema and no erythema present. No thyromegaly present.  Cardiovascular: Normal rate, regular rhythm, normal heart sounds, intact distal pulses and  normal pulses. Exam reveals no gallop, no distant heart sounds and no friction rub.  No murmur heard. Pulses:      Dorsalis pedis pulses are 2+ on the right side, and 2+ on the left side.  No edema  Pulmonary/Chest: Effort normal and breath sounds normal. No accessory muscle usage. No respiratory distress. She has no decreased breath sounds. She has no wheezes. She has no rhonchi. She has no rales. She exhibits no tenderness.  Abdominal: Soft. Normal appearance and bowel sounds are normal. She exhibits no distension and no ascites. There is no tenderness.  Musculoskeletal: Normal range of motion. She exhibits no edema or tenderness.  Expected osteoarthritis, stiffness; Bilateral Calves soft, supple. Negative Homan's Sign. B- pedal pulses equal  Neurological: She is alert and oriented to person, place, and time. She has normal strength.  Skin: Skin is warm and dry. Abrasion noted. She is not  diaphoretic. There is erythema. No cyanosis. No pallor. Nails show no clubbing.  Left middle finger  Psychiatric: She has a normal mood and affect. Her speech is normal and behavior is normal. Judgment and thought content normal. Cognition and memory are normal.  Nursing note and vitals reviewed.   Labs reviewed: Recent Labs    10/05/16 1328 10/06/16 0406 02/14/17 0435  NA 138 139 140  K 4.4 4.2 5.1  CL 106 107 112*  CO2 24 26 23   GLUCOSE 104* 109* 120*  BUN 29* 28* 39*  CREATININE 1.11* 1.08* 1.13*  CALCIUM 9.2 8.4* 9.3  MG  --   --  1.9   Recent Labs    09/04/16 1522 10/05/16 1328 02/14/17 0435  AST 18 30 34  ALT 10 11* 23  ALKPHOS 95 84 144*  BILITOT 0.6 1.5* 0.6  PROT 6.9 6.9 6.6  ALBUMIN 3.6 3.5 3.3*   Recent Labs    10/05/16 1328  10/10/16 1207 10/25/16 1135 02/14/17 0435  WBC 5.9   < > 6.4 5.5 4.5  NEUTROABS 3.7  --   --  3.2 2.1  HGB 8.0*   < > 11.3* 12.0 11.3*  HCT 25.0*   < > 35.9* 37.4 33.5*  MCV 84.2   < > 86.7 88.7 90.4  PLT 191   < > 238.0 217.0 173   < > = values in this interval not displayed.   Lab Results  Component Value Date   TSH 0.647 02/14/2017   Lab Results  Component Value Date   HGBA1C 6.7 (H) 09/04/2016   Lab Results  Component Value Date   CHOL 156 02/14/2017   HDL 49 02/14/2017   LDLCALC 57 02/14/2017   LDLDIRECT 58.0 09/04/2016   TRIG 251 (H) 02/14/2017   CHOLHDL 3.2 02/14/2017    Significant Diagnostic Results in last 30 days:  No results found.  Assessment/Plan  Dog bite of finger, initial encounter Infection of skin of finger  Interventions initiated via telephone on Saturday  Rocephin 2 grams IM x 1, reconstitute with Lidocaine  Then in the AM, start Augmentin 875/125 mg- 1 tablet po Q 12 hours x 10 days  Very warm Epsom salt soaks of the left hand BID  Apply Bacitracin ointment and bandaid after soaks  Tetanus-diphtheria booster IM x 1  Pain meds per previous orders.  Continue Augmentin 875/125  mg po Q 12 hours x 10 days until course complete  Continue Epsom Salt soaks BID with Bacitracin ointment and bandaid  Follow up next week or sooner if needed  Family/ staff Communication:  Total Time:  Documentation:  Face to Face:  Family/Phone:   Labs/tests ordered:    Medication list reviewed and assessed for continued appropriateness.  Vikki Ports, NP-C Geriatrics [facility] [phone], [place] [address] Cell Phone (Mon-Fri 8am-5pm):  [phone] On Call:  [phone] & follow prompts after 5pm & weekends Office Phone:  [phone] Office Fax:  [phone]

## 2017-07-12 ENCOUNTER — Non-Acute Institutional Stay: Payer: Medicare Other | Admitting: Gerontology

## 2017-07-12 ENCOUNTER — Encounter: Payer: Self-pay | Admitting: Gerontology

## 2017-07-12 DIAGNOSIS — M1990 Unspecified osteoarthritis, unspecified site: Secondary | ICD-10-CM

## 2017-07-12 DIAGNOSIS — S61259D Open bite of unspecified finger without damage to nail, subsequent encounter: Secondary | ICD-10-CM | POA: Diagnosis not present

## 2017-07-12 DIAGNOSIS — W540XXD Bitten by dog, subsequent encounter: Secondary | ICD-10-CM | POA: Diagnosis not present

## 2017-07-12 DIAGNOSIS — R238 Other skin changes: Secondary | ICD-10-CM

## 2017-07-18 ENCOUNTER — Encounter: Payer: Self-pay | Admitting: Gerontology

## 2017-07-18 ENCOUNTER — Non-Acute Institutional Stay: Payer: Medicare Other | Admitting: Gerontology

## 2017-07-18 DIAGNOSIS — L089 Local infection of the skin and subcutaneous tissue, unspecified: Secondary | ICD-10-CM | POA: Diagnosis not present

## 2017-07-18 DIAGNOSIS — W540XXD Bitten by dog, subsequent encounter: Secondary | ICD-10-CM | POA: Diagnosis not present

## 2017-07-18 DIAGNOSIS — S61259D Open bite of unspecified finger without damage to nail, subsequent encounter: Secondary | ICD-10-CM

## 2017-07-18 NOTE — Progress Notes (Signed)
Location:   [facility] Room Number: [phone] Place of Service:  ALF [phone]) Provider:  Toni Arthurs, NP-C  Leone Haven, MD  Patient Care Team: Leone Haven, MD as PCP - General (Family Medicine)  Extended Emergency Contact Information Primary Emergency Contact: [place] of [employer] Phone: [phone] Relation: Niece Secondary Emergency Contact: Charolotte Eke [place] of [place] Phone: [phone] Relation: Nephew  Code Status:  DNR Goals of care: Advanced Directive information Advanced Directives 07/18/2017  Does Patient Have a Medical Advance Directive? Yes  Type of Advance Directive Out of facility DNR (pink MOST or yellow form)  Does patient want to make changes to medical advance directive? No - Patient declined  Copy of [place] in Chart? -  Pre-existing out of facility DNR order (yellow form or pink MOST form) Yellow form placed in chart (order not valid for inpatient use)     Chief Complaint  Patient presents with  .  Acute visit    Wound infection    HPI:  Pt is a 84 y.o. female seen today for an acute visit for infection of the skin of the right middle finger.  Patient's finger was nipped by a dog several weeks ago.  Patient has been on p.o. antibiotics and twice daily warm, Epson salt water soaks.  Last week, the wound looked as though it was improving.  Today, patient reports an increase in pain, increased edema and decreased range of motion of the middle joint of the finger.  Skin is more red and more warm than previous assessment.  I discussed the options with the patient such as change p.o. antibiotics and watchful waiting versus local anesthetic and incision and drainage.  Patient decided to have the wound opened and allowed to drain.  Patient gave full verbal consent.  A total of 2 mL of 1% lidocaine was injected in the webbing between the index and middle finger, and middle  finger and ring finger.  Also on the dorsal and ventral sides of the finger at the base.  After allowing sufficient time for the anesthetic to become effective, area was cleaned well with Betadine and using a scalpel, a small triangular shaped opening was made on the lateral aspect of the PIP joint of the middle finger.  Small amount of tan, purulent drainage with some small hemolyzed clots expressed by "milking" the finger.  The tissue was then irrigated well with approximately 50 cc of normal saline flushing.  Very minor bleeding noted.  Wound was then again cleaned with Betadine with a dry dressing applied.  Patient tolerated procedure well.  Patient expressed gratitude for the procedure and for [place] trying to help her.  Vital signs stable.  No other complaints.    Past Medical History:  Diagnosis Date  . Acute gouty arthritis 11/21/2016  . Aortic atherosclerosis ([place]) 09/04/2016  . Carotid artery disease ([place]) 11/21/2016  . Carotid stenosis 12/03/2015   Advanced heavily calcified atherosclerotic disease at both carotid bifurcations. Stenoses measured at 70% affecting the right proximal ICA and 80% affecting the left proximal ICA.  Marland Kitchen Coronary atherosclerosis 09/04/2016  . Diverticulitis   . DM (diabetes mellitus), type 2 ([place]) 09/04/2016  . Gout   . HTN, goal below 140/90 11/21/2016  . Hyperlipidemia 06/20/2016  . Hypothyroidism, unspecified 11/21/2016  . Primary osteoarthritis 11/21/2016   involving multiple joints  . Stroke [facility])    Past Surgical History:  Procedure Laterality Date  .  CATARACT EXTRACTION    . Finger crystal removal     right 3rd  . toe crystal removal     right 1st toe    No Known Allergies  Allergies as of 07/18/2017   No Known Allergies     Medication List        Accurate as of 07/18/17 12:03 PM. Always use your most recent med list.          atorvastatin 20 MG tablet Commonly known as:  LIPITOR Take 1 tablet (20 mg total) by mouth daily at 6 PM.     colchicine 0.6 MG tablet Take 0.6 mg by mouth daily.   cyanocobalamin 1000 MCG tablet Take 1,000 mcg by mouth daily.   levothyroxine 75 MCG tablet Commonly known as:  SYNTHROID, LEVOTHROID Take 75 mcg by mouth daily before breakfast.   losartan 50 MG tablet Commonly known as:  COZAAR Take 50 mg by mouth daily.   MAPAP ARTHRITIS PAIN 650 MG CR tablet Generic drug:  acetaminophen Take 650 mg 2 (two) times daily by mouth.   pantoprazole 40 MG tablet Commonly known as:  PROTONIX Take 1 tablet (40 mg total) by mouth daily. Switch for any other PPI at similar dose and frequency   traZODone 50 MG tablet Commonly known as:  DESYREL Take 50 mg at bedtime by mouth.   UNABLE TO FIND Med Name: Epsom salt crystals 100% : dissolve 1/4 cup in very warm water in basin. Soak hand until water cools or at least 20 minutes 2 times a day.   Vitamin D3 2000 units capsule Take 2,000 Units by mouth daily.       Review of Systems  Constitutional: Negative for activity change, appetite change, chills, diaphoresis and fever.  HENT: Negative for congestion, mouth sores, nosebleeds, postnasal drip, sneezing, sore throat, trouble swallowing and voice change.   Respiratory: Negative for apnea, cough, choking, chest tightness, shortness of breath and wheezing.   Cardiovascular: Negative for chest pain, palpitations and leg swelling.  Gastrointestinal: Negative for abdominal distention, abdominal pain, constipation, diarrhea and nausea.  Genitourinary: Negative for difficulty urinating, dysuria, frequency and urgency.  Musculoskeletal: Positive for arthralgias (typical arthritis) and joint swelling. Negative for back pain, gait problem and myalgias.  Skin: Positive for wound. Negative for color change, pallor and rash.  Neurological: Negative for dizziness, tremors, syncope, speech difficulty, weakness, numbness and headaches.  Psychiatric/Behavioral: Negative for agitation and behavioral problems.   All other systems reviewed and are negative.   Immunization History  Administered Date(s) Administered  . Influenza,inj,Quad PF,6+ Mos 01/28/2015  . Influenza-Unspecified 02/14/2016  . PPD Test 12/28/2016   Pertinent  Health Maintenance Due  Topic Date Due  . FOOT EXAM  03/27/1929  . OPHTHALMOLOGY EXAM  03[birth year]27[birth year]1931  . PNA vac Low Risk Adult (1 of 2 - PCV13) 03/26/1986  . INFLUENZA VACCINE  12/26/2016  . HEMOGLOBIN A1C  03/06/2017  . DEXA SCAN  05/29/2023 (Originally 03/26/1986)   Fall Risk  09/04/2016  Falls in the past year? No   Functional Status Survey:    Vitals:   07/18/17 1144  BP: (!) 145/88  Pulse: 77  Resp: 20  Temp: (!) 97 F (36.1 C)  TempSrc: Oral  SpO2: 98%  Weight: 134 lb 9.6 oz (61.1 kg)  Height: 5\' 6"  (1.676 m)   Body mass index is 21.73 kg/m. Physical Exam  Constitutional: She is oriented to person, place, and time. Vital signs are normal. She appears well-developed and well-nourished. She is  active and cooperative. She does not appear ill. No distress.  HENT:  Head: Normocephalic and atraumatic.  Mouth/Throat: Uvula is midline, oropharynx is clear and moist and mucous membranes are normal. Mucous membranes are not pale, not dry and not cyanotic.  Eyes: Pupils are equal, round, and reactive to light. Conjunctivae, EOM and lids are normal.  Neck: Trachea normal, normal range of motion and full passive range of motion without pain. Neck supple. No JVD present. No tracheal deviation, no edema and no erythema present. No thyromegaly present.  Cardiovascular: Normal rate, regular rhythm, normal heart sounds, intact distal pulses and normal pulses. Exam reveals no gallop, no distant heart sounds and no friction rub.  No murmur heard. Pulses:      Dorsalis pedis pulses are 2+ on the right side, and 2+ on the left side.  No edema  Pulmonary/Chest: Effort normal and breath sounds normal. No accessory muscle usage. No respiratory distress. She has no decreased  breath sounds. She has no wheezes. She has no rhonchi. She has no rales. She exhibits no tenderness.  Abdominal: Soft. Normal appearance and bowel sounds are normal. She exhibits no distension and no ascites. There is no tenderness.  Musculoskeletal: Normal range of motion. She exhibits no edema or tenderness.  Expected osteoarthritis, stiffness; Bilateral Calves soft, supple. Negative Homan's Sign. B- pedal pulses equal ambulatory with Rollator walker;   Neurological: She is alert and oriented to person, place, and time. She has normal strength.  Skin: Skin is warm, dry and intact. She is not diaphoretic. No cyanosis. No pallor. Nails show no clubbing.  Right middle finger with dog bite, skin infection  Psychiatric: She has a normal mood and affect. Her speech is normal and behavior is normal. Judgment and thought content normal. Cognition and memory are normal.  Nursing note and vitals reviewed.   Labs reviewed: Recent Labs    10/05/16 1328 10/06/16 0406 02/14/17 0435  NA 138 139 140  K 4.4 4.2 5.1  CL 106 107 112*  CO2 24 26 23   GLUCOSE 104* 109* 120*  BUN 29* 28* 39*  CREATININE 1.11* 1.08* 1.13*  CALCIUM 9.2 8.4* 9.3  MG  --   --  1.9   Recent Labs    09/04/16 1522 10/05/16 1328 02/14/17 0435  AST 18 30 34  ALT 10 11* 23  ALKPHOS 95 84 144*  BILITOT 0.6 1.5* 0.6  PROT 6.9 6.9 6.6  ALBUMIN 3.6 3.5 3.3*   Recent Labs    10/05/16 1328  10/10/16 1207 10/25/16 1135 02/14/17 0435  WBC 5.9   < > 6.4 5.5 4.5  NEUTROABS 3.7  --   --  3.2 2.1  HGB 8.0*   < > 11.3* 12.0 11.3*  HCT 25.0*   < > 35.9* 37.4 33.5*  MCV 84.2   < > 86.7 88.7 90.4  PLT 191   < > 238.0 217.0 173   < > = values in this interval not displayed.   Lab Results  Component Value Date   TSH 0.647 02/14/2017   Lab Results  Component Value Date   HGBA1C 6.7 (H) 09/04/2016   Lab Results  Component Value Date   CHOL 156 02/14/2017   HDL 49 02/14/2017   LDLCALC 57 02/14/2017   LDLDIRECT 58.0  09/04/2016   TRIG 251 (H) 02/14/2017   CHOLHDL 3.2 02/14/2017    Significant Diagnostic Results in last 30 days:  No results found.  Assessment/Plan  Dog bite of finger, subsequent encounter  Infection of skin of finger   Continue twice daily finger soaks in warm, Epson salt water  Dry finger well, then clean around incisional opening with Betadine  Then apply Bactroban 2% ointment and Band-Aid  Continue until wound is healed  Cipro 500 mg p.o. every 12 hours times 7 days  Clindamycin 300 mg p.o. 3 times daily times 7 days  Continue probiotic 250 mg tablets p.o. twice daily  Monitor closely for any signs of worsening infection  Continue Tylenol arthritis 650 mg CR tablets 1 tablet p.o. twice daily  Family/ staff Communication:   Total Time: 75 minutes  Documentation: 10 minutes  Face to Face: 65 minutes  Family/Phone:   Labs/tests ordered:    Medication list reviewed and assessed for continued appropriateness.  Vikki Ports, NP-C Geriatrics [facility] [phone], [place] [address] Cell Phone (Mon-Fri 8am-5pm):  [phone] On Call:  [phone] & follow prompts after 5pm & weekends Office Phone:  [phone] Office Fax:  [phone]

## 2017-07-22 ENCOUNTER — Non-Acute Institutional Stay: Payer: Medicare Other | Admitting: Gerontology

## 2017-07-22 DIAGNOSIS — W540XXD Bitten by dog, subsequent encounter: Secondary | ICD-10-CM

## 2017-07-22 DIAGNOSIS — L089 Local infection of the skin and subcutaneous tissue, unspecified: Secondary | ICD-10-CM

## 2017-07-22 DIAGNOSIS — S61259D Open bite of unspecified finger without damage to nail, subsequent encounter: Secondary | ICD-10-CM

## 2017-07-23 ENCOUNTER — Encounter: Payer: Self-pay | Admitting: Gerontology

## 2017-07-23 NOTE — Progress Notes (Signed)
Location:   [facility] Room Number: [phone] Place of Service:  ALF [phone]) Provider:  Toni Arthurs, NP-C  Leone Haven, MD  Patient Care Team: Leone Haven, MD as PCP - General (Family Medicine)  Extended Emergency Contact Information Primary Emergency Contact: [place] Phone: [phone] Relation: Niece Secondary Emergency Contact: Charolotte Eke [place] of [place] Phone: [phone] Relation: Nephew  Code Status: DNR Goals of care: Advanced Directive information Advanced Directives 07/22/2017  Does Patient Have a Medical Advance Directive? Yes  Type of Advance Directive Out of facility DNR (pink MOST or yellow form)  Does patient want to make changes to medical advance directive? No - Patient declined  Copy of [place] in Chart? -  Pre-existing out of facility DNR order (yellow form or pink MOST form) Yellow form placed in chart (order not valid for inpatient use)     Chief Complaint  Patient presents with  . Acute visit    Infected finger    HPI:  Pt is a 84 y.o. female seen today for an acute visit for infection of the finger. Pt sustained a dog bit to the right middle finger and underwent I&D of the finger almost a week ago. The finger is now still slightly swollen and pink. It is not red or hot. No drainage. Pt c/o only intermittent pain with deep flexion of the finger. Pt is afebrile. Pt has been on po Cipro and Clindamycin. Staff is doing warm Epsom salt soaks of the hand, followed by bactroban and a bandaid BID. Finger is looking much better. VSS. No other complaints.      Past Medical History:  Diagnosis Date  . Acute gouty arthritis 11/21/2016  . Aortic atherosclerosis ([place]) 09/04/2016  . Carotid artery disease ([place]) 11/21/2016  . Carotid stenosis 12/03/2015   Advanced heavily calcified atherosclerotic disease at both carotid bifurcations. Stenoses measured at  70% affecting the right proximal ICA and 80% affecting the left proximal ICA.  Marland Kitchen Coronary atherosclerosis 09/04/2016  . Diverticulitis   . DM (diabetes mellitus), type 2 ([place]) 09/04/2016  . Gout   . HTN, goal below 140/90 11/21/2016  . Hyperlipidemia 06/20/2016  . Hypothyroidism, unspecified 11/21/2016  . Primary osteoarthritis 11/21/2016   involving multiple joints  . Stroke [facility])    Past Surgical History:  Procedure Laterality Date  . CATARACT EXTRACTION    . Finger crystal removal     right 3rd  . toe crystal removal     right 1st toe    No Known Allergies  Allergies as of 07/22/2017   No Known Allergies     Medication List        Accurate as of 07/22/17 11:59 PM. Always use your most recent med list.          atorvastatin 20 MG tablet Commonly known as:  LIPITOR Take 1 tablet (20 mg total) by mouth daily at 6 PM.   colchicine 0.6 MG tablet Take 0.6 mg by mouth daily.   cyanocobalamin 1000 MCG tablet Take 1,000 mcg by mouth daily.   levothyroxine 75 MCG tablet Commonly known as:  SYNTHROID, LEVOTHROID Take 75 mcg by mouth daily before breakfast.   losartan 50 MG tablet Commonly known as:  COZAAR Take 50 mg by mouth daily.   MAPAP ARTHRITIS PAIN 650 MG CR tablet Generic drug:  acetaminophen Take 650 mg 2 (two) times daily by mouth.   pantoprazole 40 MG  tablet Commonly known as:  PROTONIX Take 1 tablet (40 mg total) by mouth daily. Switch for any other PPI at similar dose and frequency   traZODone 50 MG tablet Commonly known as:  DESYREL Take 50 mg at bedtime by mouth.   UNABLE TO FIND Med Name: Epsom salt crystals 100% : dissolve 1/4 cup in very warm water in basin. Soak hand until water cools or at least 20 minutes 2 times a day.   Vitamin D3 2000 units capsule Take 2,000 Units by mouth daily.       Review of Systems  Constitutional: Negative for activity change, appetite change, chills, diaphoresis and fever.  HENT: Negative for congestion,  mouth sores, nosebleeds, postnasal drip, sneezing, sore throat, trouble swallowing and voice change.   Respiratory: Negative for apnea, cough, choking, chest tightness, shortness of breath and wheezing.   Cardiovascular: Negative for chest pain, palpitations and leg swelling.  Gastrointestinal: Negative for abdominal distention, abdominal pain, constipation, diarrhea and nausea.  Genitourinary: Negative for difficulty urinating, dysuria, frequency and urgency.  Musculoskeletal: Negative for back pain, gait problem and myalgias. Arthralgias: typical arthritis.  Skin: Positive for wound. Negative for color change, pallor and rash.  Neurological: Negative for dizziness, tremors, syncope, speech difficulty, weakness, numbness and headaches.  Psychiatric/Behavioral: Negative for agitation and behavioral problems.  All other systems reviewed and are negative.   Immunization History  Administered Date(s) Administered  . Influenza,inj,Quad PF,6+ Mos 01/28/2015  . Influenza-Unspecified 02/14/2016  . PPD Test 12/28/2016   Pertinent  Health Maintenance Due  Topic Date Due  . FOOT EXAM  03/27/1929  . OPHTHALMOLOGY EXAM  03[birth year]27[birth year]31  . PNA vac Low Risk Adult (1 of 2 - PCV13) 03/26/1986  . INFLUENZA VACCINE  12/26/2016  . HEMOGLOBIN A1C  03/06/2017  . DEXA SCAN  05/29/2023 (Originally 03/26/1986)   Fall Risk  09/04/2016  Falls in the past year? No   Functional Status Survey:    Vitals:   07/22/17 1104  BP: (!) 145/88  Pulse: 77  Resp: 20  Temp: (!) 97 F (36.1 C)  TempSrc: Oral  SpO2: 98%  Weight: 134 lb 9.6 oz (61.1 kg)  Height: 5\' 6"  (1.676 m)   Body mass index is 21.73 kg/m. Physical Exam  Constitutional: She is oriented to person, place, and time. Vital signs are normal. She appears well-developed and well-nourished. She is active and cooperative. She does not appear ill. No distress.  HENT:  Head: Normocephalic and atraumatic.  Mouth/Throat: Uvula is midline, oropharynx is  clear and moist and mucous membranes are normal. Mucous membranes are not pale, not dry and not cyanotic.  Eyes: Pupils are equal, round, and reactive to light. Conjunctivae, EOM and lids are normal.  Neck: Trachea normal, normal range of motion and full passive range of motion without pain. Neck supple. No JVD present. No tracheal deviation, no edema and no erythema present. No thyromegaly present.  Cardiovascular: Normal rate, normal heart sounds, intact distal pulses and normal pulses. An irregular rhythm present. Exam reveals no gallop, no distant heart sounds and no friction rub.  No murmur heard. Pulses:      Dorsalis pedis pulses are 2+ on the right side, and 2+ on the left side.  No edema  Pulmonary/Chest: Effort normal and breath sounds normal. No accessory muscle usage. No respiratory distress. She has no decreased breath sounds. She has no wheezes. She has no rhonchi. She has no rales. She exhibits no tenderness.  Abdominal: Soft. Normal appearance and bowel sounds  are normal. She exhibits no distension and no ascites. There is no tenderness.  Musculoskeletal: Normal range of motion. She exhibits no edema or tenderness.  Expected osteoarthritis, stiffness; Bilateral Calves soft, supple. Negative Homan's Sign. B- pedal pulses equal  Neurological: She is alert and oriented to person, place, and time. She has normal strength.  Skin: Skin is warm and dry. Laceration (right middle finger- dog bite) noted. She is not diaphoretic. No cyanosis. No pallor. Nails show no clubbing.  Psychiatric: She has a normal mood and affect. Her speech is normal and behavior is normal. Judgment and thought content normal. Cognition and memory are normal.  Nursing note and vitals reviewed.   Labs reviewed: Recent Labs    10/05/16 1328 10/06/16 0406 02/14/17 0435  NA 138 139 140  K 4.4 4.2 5.1  CL 106 107 112*  CO2 24 26 23   GLUCOSE 104* 109* 120*  BUN 29* 28* 39*  CREATININE 1.11* 1.08* 1.13*    CALCIUM 9.2 8.4* 9.3  MG  --   --  1.9   Recent Labs    09/04/16 1522 10/05/16 1328 02/14/17 0435  AST 18 30 34  ALT 10 11* 23  ALKPHOS 95 84 144*  BILITOT 0.6 1.5* 0.6  PROT 6.9 6.9 6.6  ALBUMIN 3.6 3.5 3.3*   Recent Labs    10/05/16 1328  10/10/16 1207 10/25/16 1135 02/14/17 0435  WBC 5.9   < > 6.4 5.5 4.5  NEUTROABS 3.7  --   --  3.2 2.1  HGB 8.0*   < > 11.3* 12.0 11.3*  HCT 25.0*   < > 35.9* 37.4 33.5*  MCV 84.2   < > 86.7 88.7 90.4  PLT 191   < > 238.0 217.0 173   < > = values in this interval not displayed.   Lab Results  Component Value Date   TSH 0.647 02/14/2017   Lab Results  Component Value Date   HGBA1C 6.7 (H) 09/04/2016   Lab Results  Component Value Date   CHOL 156 02/14/2017   HDL 49 02/14/2017   LDLCALC 57 02/14/2017   LDLDIRECT 58.0 09/04/2016   TRIG 251 (H) 02/14/2017   CHOLHDL 3.2 02/14/2017    Significant Diagnostic Results in last 30 days:  No results found.  Assessment/Plan  Dog bite of finger, subsequent encounter  Infection of skin of finger   Continue Cipro 500 mg po Q 12 hours x 7 days total  Continue Clindamycin 300 mg po TID x 7 days total  Continue Warm Epsom salt soaks BID  Continue Bactroban and bandaid BID until healed  Add Risa-bid 250 mg po BID for gut health  Monitor for increased s/s of infection   Monitor for diarrhea  Family/ staff Communication:   Total Time:  Documentation:  Face to Face:  Family/Phone:   Labs/tests ordered:    Medication list reviewed and assessed for continued appropriateness.  Vikki Ports, NP-C Geriatrics [facility] [phone], [place] [address] Cell Phone (Mon-Fri 8am-5pm):  [phone] On Call:  [phone] & follow prompts after 5pm & weekends Office Phone:  [phone] Office Fax:  [phone]

## 2017-07-26 ENCOUNTER — Encounter
Admission: RE | Admit: 2017-07-26 | Discharge: 2017-07-26 | Disposition: A | Discharge status: Home / Self Care | Payer: Medicare Other | Source: Ambulatory Visit | Attending: Internal Medicine | Admitting: Internal Medicine

## 2017-08-12 ENCOUNTER — Non-Acute Institutional Stay: Payer: Medicare Other | Admitting: Gerontology

## 2017-08-12 DIAGNOSIS — Z86718 Personal history of other venous thrombosis and embolism: Secondary | ICD-10-CM | POA: Diagnosis not present

## 2017-08-12 DIAGNOSIS — W540XXA Bitten by dog, initial encounter: Secondary | ICD-10-CM

## 2017-08-12 DIAGNOSIS — W540XXD Bitten by dog, subsequent encounter: Secondary | ICD-10-CM | POA: Diagnosis not present

## 2017-08-12 DIAGNOSIS — S61259D Open bite of unspecified finger without damage to nail, subsequent encounter: Secondary | ICD-10-CM | POA: Diagnosis not present

## 2017-08-12 DIAGNOSIS — M1A30X0 Chronic gout due to renal impairment, unspecified site, without tophus (tophi): Secondary | ICD-10-CM

## 2017-08-12 DIAGNOSIS — S61259A Open bite of unspecified finger without damage to nail, initial encounter: Secondary | ICD-10-CM | POA: Insufficient documentation

## 2017-08-12 NOTE — Assessment & Plan Note (Signed)
Nearly all healed. Very slight edema. Some pinkness in the area of the surgical incision. No warmth. No pain unless pressed. Then pt describes it as "I feel it being pressed, but no pain." Full ROM at baseline. Pt reports she has been doing hand/ finger exercises for mobility d/t the bite and the RA.

## 2017-08-12 NOTE — Assessment & Plan Note (Signed)
Stable/ Resolved. Not on any DVT prophylaxis per Vascular MD.

## 2017-08-12 NOTE — Progress Notes (Signed)
Location:      Place of Service:  ALF (13) Provider:  Toni Arthurs, NP-C  Leone Haven, MD  Patient Care Team: Leone Haven, MD as PCP - General (Family Medicine)  Extended Emergency Contact Information Primary Emergency Contact: [place] of [place] Phone: [phone] Relation: Niece Secondary Emergency Contact: Charolotte Eke [place] of [place] Phone: [phone] Relation: Nephew  Code Status:  DNR Goals of care: Advanced Directive information Advanced Directives 07/22/2017  Does Patient Have a Medical Advance Directive? Yes  Type of Advance Directive Out of facility DNR (pink MOST or yellow form)  Does patient want to make changes to medical advance directive? No - Patient declined  Copy of [place] in Chart? -  Pre-existing out of facility DNR order (yellow form or pink MOST form) Yellow form placed in chart (order not valid for inpatient use)     Chief Complaint  Patient presents with  . Medical Management of Chronic Issues    HPI:  Pt is a 84 y.o. female seen today for medical management of chronic diseases.    Dog bite of finger Nearly all healed. Very slight edema. Some pinkness in the area of the surgical incision. No warmth. No pain unless pressed. Then pt describes it as "I feel it being pressed, but no pain." Full ROM at baseline. Pt reports she has been doing hand/ finger exercises for mobility d/t the bite and the RA.   Chronic gout due to renal impairment, unspecified site, without tophus (tophi) Stable. No recent gout flares. On Colchicine 0.6 mg po Q Day for prophylaxis.   History of DVT (deep vein thrombosis) Stable/ Resolved. Not on any DVT prophylaxis per Vascular MD.     Past Medical History:  Diagnosis Date  . Acute gouty arthritis 11/21/2016  . Aortic atherosclerosis ([place]) 09/04/2016  . Carotid artery disease ([place]) 11/21/2016  . Carotid stenosis 12/03/2015   Advanced  heavily calcified atherosclerotic disease at both carotid bifurcations. Stenoses measured at 70% affecting the right proximal ICA and 80% affecting the left proximal ICA.  Marland Kitchen Coronary atherosclerosis 09/04/2016  . Diverticulitis   . DM (diabetes mellitus), type 2 ([place]) 09/04/2016  . Gout   . HTN, goal below 140/90 11/21/2016  . Hyperlipidemia 06/20/2016  . Hypothyroidism, unspecified 11/21/2016  . Primary osteoarthritis 11/21/2016   involving multiple joints  . Stroke [facility])    Past Surgical History:  Procedure Laterality Date  . CATARACT EXTRACTION    . Finger crystal removal     right 3rd  . toe crystal removal     right 1st toe    No Known Allergies  Allergies as of 08/12/2017   No Known Allergies     Medication List        Accurate as of 08/12/17  5:01 PM. Always use your most recent med list.          ASPERCREME 10 % Lotn Generic drug:  Trolamine Salicylate Apply thin film topically to areas of pain 3 times a day as needed   atorvastatin 20 MG tablet Commonly known as:  LIPITOR Take 1 tablet (20 mg total) by mouth daily at 6 PM.   colchicine 0.6 MG tablet Take 0.6 mg by mouth daily.   cyanocobalamin 1000 MCG tablet Take 1,000 mcg by mouth daily.   levothyroxine 75 MCG tablet Commonly known as:  SYNTHROID, LEVOTHROID Take 75 mcg by mouth daily before breakfast.   losartan 50 MG tablet Commonly  known as:  COZAAR Take 50 mg by mouth daily.   MAPAP ARTHRITIS PAIN 650 MG CR tablet Generic drug:  acetaminophen Take 650 mg 2 (two) times daily by mouth.   mupirocin cream 2 % Commonly known as:  BACTROBAN Apply 1 application topically 2 (two) times daily. apply to right 3rd finger after soaking with Epsom salt. Cover with bandaide.   pantoprazole 40 MG tablet Commonly known as:  PROTONIX Take 1 tablet (40 mg total) by mouth daily. Switch for any other PPI at similar dose and frequency   RISA-BID PROBIOTIC Tabs Take 1 tablet by mouth 2 (two) times daily.     traZODone 50 MG tablet Commonly known as:  DESYREL Take 50 mg at bedtime by mouth.   UNABLE TO FIND Med Name: Epsom salt crystals 100% : Dissolve 1/4 cup in very warm water in basin. Soak LEFT hand until water cools or at least 20 minutes. BID. Then Scrub L- middle finger with Betadine, apply Bactroban and bandaid   Vitamin D3 2000 units capsule Take 2,000 Units by mouth daily.       Review of Systems  Constitutional: Negative for activity change, appetite change, chills, diaphoresis and fever.  HENT: Negative for congestion, mouth sores, nosebleeds, postnasal drip, sneezing, sore throat, trouble swallowing and voice change.   Respiratory: Negative for apnea, cough, choking, chest tightness, shortness of breath and wheezing.   Cardiovascular: Negative for chest pain, palpitations and leg swelling.  Gastrointestinal: Negative for abdominal distention, abdominal pain, constipation, diarrhea and nausea.  Genitourinary: Negative for difficulty urinating, dysuria, frequency and urgency.  Musculoskeletal: Positive for arthralgias (typical arthritis) and joint swelling. Negative for back pain, gait problem and myalgias.  Skin: Negative for color change, pallor, rash and wound.  Neurological: Negative for dizziness, tremors, syncope, speech difficulty, weakness, numbness and headaches.  Psychiatric/Behavioral: Negative for agitation and behavioral problems.  All other systems reviewed and are negative.   Immunization History  Administered Date(s) Administered  . Influenza,inj,Quad PF,6+ Mos 01/28/2015  . Influenza-Unspecified 02/14/2016  . PPD Test 12/28/2016   Pertinent  Health Maintenance Due  Topic Date Due  . FOOT EXAM  03/27/1929  . OPHTHALMOLOGY EXAM  03/27/1929  . PNA vac Low Risk Adult (1 of 2 - PCV13) 03/26/1986  . INFLUENZA VACCINE  12/26/2016  . HEMOGLOBIN A1C  03/06/2017  . DEXA SCAN  05/29/2023 (Originally 03/26/1986)   Fall Risk  09/04/2016  Falls in the past year?  No   Functional Status Survey:    There were no vitals filed for this visit. There is no height or weight on file to calculate BMI. Physical Exam  Constitutional: She is oriented to person, place, and time. Vital signs are normal. She appears well-developed and well-nourished. She is active and cooperative. She does not appear ill. No distress.  HENT:  Head: Normocephalic and atraumatic.  Mouth/Throat: Uvula is midline, oropharynx is clear and moist and mucous membranes are normal. Mucous membranes are not pale, not dry and not cyanotic.  Eyes: Conjunctivae, EOM and lids are normal. Pupils are equal, round, and reactive to light.  Neck: Trachea normal, normal range of motion and full passive range of motion without pain. Neck supple. No JVD present. No tracheal deviation, no edema and no erythema present. No thyromegaly present.  Cardiovascular: Normal rate, regular rhythm, normal heart sounds, intact distal pulses and normal pulses. Exam reveals no gallop, no distant heart sounds and no friction rub.  No murmur heard. Pulses:  Dorsalis pedis pulses are 2+ on the right side, and 2+ on the left side.  No edema  Pulmonary/Chest: Effort normal and breath sounds normal. No accessory muscle usage. No respiratory distress. She has no decreased breath sounds. She has no wheezes. She has no rhonchi. She has no rales. She exhibits no tenderness.  Abdominal: Soft. Normal appearance and bowel sounds are normal. She exhibits no distension and no ascites. There is no tenderness.  Musculoskeletal: Normal range of motion. She exhibits no edema or tenderness.  Expected osteoarthritis, stiffness; Bilateral Calves soft, supple. Negative Homan's Sign. B- pedal pulses equal; RA in hands  Neurological: She is alert and oriented to person, place, and time. She has normal strength.  Skin: Skin is warm, dry and intact. She is not diaphoretic. No cyanosis. No pallor. Nails show no clubbing.  Psychiatric: She has  a normal mood and affect. Her speech is normal and behavior is normal. Judgment and thought content normal. Cognition and memory are normal.  Nursing note and vitals reviewed.   Labs reviewed: Recent Labs    10/05/16 1328 10/06/16 0406 02/14/17 0435  NA 138 139 140  K 4.4 4.2 5.1  CL 106 107 112*  CO2 _0 GLUCOSE 104* 109* 120*  BUN 29* 28* 39*  CREATININE 1.11* 1.08* 1.13*  CALCIUM 9.2 8.4* 9.3  MG  --   --  1.9   Recent Labs    09/04/16 1522 10/05/16 1328 02/14/17 0435  AST 18 30 34  ALT 10 11* 23  ALKPHOS 95 84 144*  BILITOT 0.6 1.5* 0.6  PROT 6.9 6.9 6.6  ALBUMIN 3.6 3.5 3.3*   Recent Labs    10/05/16 1328  10/10/16 1207 10/25/16 1135 02/14/17 0435  WBC 5.9   < > 6.4 5.5 4.5  NEUTROABS 3.7  --   --  3.2 2.1  HGB 8.0*   < > 11.3* 12.0 11.3*  HCT 25.0*   < > 35.9* 37.4 33.5*  MCV 84.2   < > 86.7 88.7 90.4  PLT 191   < > 238.0 217.0 173   < > = values in this interval not displayed.   Lab Results  Component Value Date   TSH 0.647 02/14/2017   Lab Results  Component Value Date   HGBA1C 6.7 (H) 09/04/2016   Lab Results  Component Value Date   CHOL 156 02/14/2017   HDL 49 02/14/2017   LDLCALC 57 02/14/2017   LDLDIRECT 58.0 09/04/2016   TRIG 251 (H) 02/14/2017   CHOLHDL 3.2 02/14/2017    Significant Diagnostic Results in last 30 days:  No results found.  Assessment/Plan Arlethia was seen today for medical management of chronic issues.  Diagnoses and all orders for this visit:  Dog bite of finger, subsequent encounter  Chronic gout due to renal impairment without tophus, unspecified site  History of DVT (deep vein thrombosis)   Above listed conditions stable  Continue current medication regimen  Continue hand exercises for mobility  Will check serum uric acid level in the am and consider dc'ing chronic colchicine  Monitor for s/s of DVT  Family/ staff Communication:   Total Time:  Documentation:  Face to  Face:  Family/Phone:   Labs/tests ordered:  Cbc, met c, mag+, TSH, Vit B12, Vit D, uric acid, lipid panel  Medication list reviewed and assessed for continued appropriateness. Monthly medication orders reviewed and signed.  Vikki Ports, NP-C Geriatrics [facility] [phone]  [place], [place] [address] Cell Phone (Mon-Fri 8am-5pm):  [phone] On Call:  [phone] & follow prompts after 5pm & weekends Office Phone:  [phone] Office Fax:  [phone]

## 2017-08-12 NOTE — Assessment & Plan Note (Signed)
Stable. No recent gout flares. On Colchicine 0.6 mg po Q Day for prophylaxis.

## 2017-08-13 ENCOUNTER — Other Ambulatory Visit
Admission: RE | Admit: 2017-08-13 | Discharge: 2017-08-13 | Disposition: A | Discharge status: Home / Self Care | Payer: Medicare Other | Source: Ambulatory Visit | Attending: Gerontology | Admitting: Gerontology

## 2017-08-13 DIAGNOSIS — I2510 Atherosclerotic heart disease of native coronary artery without angina pectoris: Secondary | ICD-10-CM | POA: Diagnosis not present

## 2017-08-13 LAB — LIPID PANEL
CHOL/HDL RATIO: 2.5 ratio
Cholesterol: 112 mg/dL (ref 0–200)
HDL: 45 mg/dL (ref >40)
LDL CALC: 39 mg/dL (ref 0–99)
TRIGLYCERIDES: 142 mg/dL (ref <150)
VLDL: 28 mg/dL (ref 0–40)

## 2017-08-13 LAB — COMPREHENSIVE METABOLIC PANEL
ALBUMIN: 3.2 g/dL — AB (ref 3.5–5.0)
ALT: 22 U/L (ref 14–54)
ANION GAP: 7 (ref 5–15)
AST: 32 U/L (ref 15–41)
Alkaline Phosphatase: 116 U/L (ref 38–126)
BUN: 29 mg/dL — ABNORMAL HIGH (ref 6–20)
CO2: 22 mmol/L (ref 22–32)
Calcium: 8.9 mg/dL (ref 8.9–10.3)
Chloride: 110 mmol/L (ref 101–111)
Creatinine, Ser: 1.53 mg/dL — ABNORMAL HIGH (ref 0.44–1.00)
GFR calc non Af Amer: 28 mL/min — ABNORMAL LOW (ref >60)
GFR, EST AFRICAN AMERICAN: 32 mL/min — AB (ref >60)
GLUCOSE: 110 mg/dL — AB (ref 65–99)
POTASSIUM: 5.6 mmol/L — AB (ref 3.5–5.1)
Sodium: 139 mmol/L (ref 135–145)
TOTAL PROTEIN: 6.1 g/dL — AB (ref 6.5–8.1)
Total Bilirubin: 0.8 mg/dL (ref 0.3–1.2)

## 2017-08-13 LAB — VITAMIN B12: VITAMIN B 12: 1919 pg/mL — AB (ref 180–914)

## 2017-08-13 LAB — CBC WITH DIFFERENTIAL/PLATELET
BASOS ABS: 0.1 10*3/uL (ref 0–0.1)
BASOS PCT: 2 %
EOS ABS: 0.2 10*3/uL (ref 0–0.7)
EOS PCT: 4 %
HCT: 31.1 % — ABNORMAL LOW (ref 35.0–47.0)
Hemoglobin: 10.4 g/dL — ABNORMAL LOW (ref 12.0–16.0)
LYMPHS PCT: 25 %
Lymphs Abs: 1 10*3/uL (ref 1.0–3.6)
MCH: 31.9 pg (ref 26.0–34.0)
MCHC: 33.3 g/dL (ref 32.0–36.0)
MCV: 95.9 fL (ref 80.0–100.0)
Monocytes Absolute: 0.7 10*3/uL (ref 0.2–0.9)
Monocytes Relative: 18 %
Neutro Abs: 2 10*3/uL (ref 1.4–6.5)
Neutrophils Relative %: 51 %
PLATELETS: 157 10*3/uL (ref 150–440)
RBC: 3.24 MIL/uL — ABNORMAL LOW (ref 3.80–5.20)
RDW: 15.6 % — AB (ref 11.5–14.5)
WBC: 3.8 10*3/uL (ref 3.6–11.0)

## 2017-08-13 LAB — TSH: TSH: 6.302 u[IU]/mL — AB (ref 0.350–4.500)

## 2017-08-13 LAB — MAGNESIUM: Magnesium: 1.3 mg/dL — ABNORMAL LOW (ref 1.7–2.4)

## 2017-08-13 LAB — URIC ACID: URIC ACID, SERUM: 9 mg/dL — AB (ref 2.3–6.6)

## 2017-08-14 ENCOUNTER — Other Ambulatory Visit
Admission: RE | Admit: 2017-08-14 | Discharge: 2017-08-14 | Disposition: A | Discharge status: Home / Self Care | Payer: Medicare Other | Source: Skilled Nursing Facility | Attending: Family Medicine | Admitting: Family Medicine

## 2017-08-14 DIAGNOSIS — E875 Hyperkalemia: Secondary | ICD-10-CM | POA: Insufficient documentation

## 2017-08-14 LAB — VITAMIN D 25 HYDROXY (VIT D DEFICIENCY, FRACTURES): VIT D 25 HYDROXY: 37.3 ng/mL (ref 30.0–100.0)

## 2017-08-14 LAB — BASIC METABOLIC PANEL
ANION GAP: 9 (ref 5–15)
BUN: 32 mg/dL — ABNORMAL HIGH (ref 6–20)
CALCIUM: 8.9 mg/dL (ref 8.9–10.3)
CO2: 22 mmol/L (ref 22–32)
CREATININE: 1.51 mg/dL — AB (ref 0.44–1.00)
Chloride: 109 mmol/L (ref 101–111)
GFR, EST AFRICAN AMERICAN: 32 mL/min — AB (ref >60)
GFR, EST NON AFRICAN AMERICAN: 28 mL/min — AB (ref >60)
GLUCOSE: 105 mg/dL — AB (ref 65–99)
Potassium: 5.3 mmol/L — ABNORMAL HIGH (ref 3.5–5.1)
Sodium: 140 mmol/L (ref 135–145)

## 2017-08-16 DIAGNOSIS — M150 Primary generalized (osteo)arthritis: Secondary | ICD-10-CM | POA: Diagnosis not present

## 2017-08-16 DIAGNOSIS — I10 Essential (primary) hypertension: Secondary | ICD-10-CM | POA: Diagnosis not present

## 2017-08-16 DIAGNOSIS — E034 Atrophy of thyroid (acquired): Secondary | ICD-10-CM | POA: Diagnosis not present

## 2017-08-16 DIAGNOSIS — I779 Disorder of arteries and arterioles, unspecified: Secondary | ICD-10-CM | POA: Diagnosis not present

## 2017-08-20 NOTE — Assessment & Plan Note (Signed)
Stable.  Patient denies any worsening pain or stiffness.  Symptoms controlled on Tylenol arthritis CR tablet 650 mg p.o. twice daily and Aspercreme 10% lotion to area of soreness 3 times daily as needed.

## 2017-08-20 NOTE — Progress Notes (Signed)
Location:    Nursing Home Room Number: 401U Place of Service:  ALF [phone]) Provider:  Toni Arthurs, NP-C  Leone Haven, MD  Patient Care Team: Leone Haven, MD as PCP - General (Family Medicine)  Extended Emergency Contact Information Primary Emergency Contact: [place] of [place] Phone: [phone] Relation: Niece Secondary Emergency Contact: Charolotte Eke [place] of [place] Phone: [phone] Relation: Nephew  Code Status: DNR Goals of care: Advanced Directive information Advanced Directives 07/22/2017  Does Patient Have a Medical Advance Directive? Yes  Type of Advance Directive Out of facility DNR (pink MOST or yellow form)  Does patient want to make changes to medical advance directive? No - Patient declined  Copy of [place] in Chart? -  Pre-existing out of facility DNR order (yellow form or pink MOST form) Yellow form placed in chart (order not valid for inpatient use)     Chief Complaint  Patient presents with  . Medical Management of Chronic Issues    Routine Visit    HPI:  Pt is a 84 y.o. female seen today for medical management of chronic diseases.    Skin irritation Stable.  Staff is using a piece of DuoDERM and applying to the right lateral ankle.  This is used to act as a barrier to prevent skin irritation.  Skin on right lateral ankle with intact, no redness, no irritation, no break in skin integrity.  Pain improved.  Osteoarthritis Stable.  Patient denies any worsening pain or stiffness.  Symptoms controlled on Tylenol arthritis CR tablet 650 mg p.o. twice daily and Aspercreme 10% lotion to area of soreness 3 times daily as needed.  Dog bite of finger Improving.  Finger is still warm with some edema.  However, the symptoms have improved.  Patient reports pain has improved.  Course of Augmentin extended until 2/20.  Staff continues to do twice daily warm Epsom salt soaks with application  of Bactroban ointment.    Past Medical History:  Diagnosis Date  . Acute gouty arthritis 11/21/2016  . Aortic atherosclerosis ([place]) 09/04/2016  . Carotid artery disease ([place]) 11/21/2016  . Carotid stenosis 12/03/2015   Advanced heavily calcified atherosclerotic disease at both carotid bifurcations. Stenoses measured at 70% affecting the right proximal ICA and 80% affecting the left proximal ICA.  Marland Kitchen Coronary atherosclerosis 09/04/2016  . Diverticulitis   . DM (diabetes mellitus), type 2 ([place]) 09/04/2016  . Gout   . HTN, goal below 140/90 11/21/2016  . Hyperlipidemia 06/20/2016  . Hypothyroidism, unspecified 11/21/2016  . Primary osteoarthritis 11/21/2016   involving multiple joints  . Stroke [facility])    Past Surgical History:  Procedure Laterality Date  . CATARACT EXTRACTION    . Finger crystal removal     right 3rd  . toe crystal removal     right 1st toe    No Known Allergies  Allergies as of 07/12/2017   No Known Allergies     Medication List        Accurate as of 07/12/17 11:59 PM. Always use your most recent med list.          amoxicillin-clavulanate 875-125 MG tablet Commonly known as:  AUGMENTIN Take 1 tablet by mouth every 12 (twelve) hours.   atorvastatin 20 MG tablet Commonly known as:  LIPITOR Take 1 tablet (20 mg total) by mouth daily at 6 PM.   colchicine 0.6 MG tablet Take 0.6 mg by mouth daily.   cyanocobalamin 1000 MCG tablet  Take 1,000 mcg by mouth daily.   levothyroxine 75 MCG tablet Commonly known as:  SYNTHROID, LEVOTHROID Take 75 mcg by mouth daily before breakfast.   losartan 50 MG tablet Commonly known as:  COZAAR Take 50 mg by mouth daily.   MAPAP ARTHRITIS PAIN 650 MG CR tablet Generic drug:  acetaminophen Take 650 mg 2 (two) times daily by mouth.   pantoprazole 40 MG tablet Commonly known as:  PROTONIX Take 1 tablet (40 mg total) by mouth daily. Switch for any other PPI at similar dose and frequency   traZODone 50 MG  tablet Commonly known as:  DESYREL Take 50 mg at bedtime by mouth.   trolamine salicylate 10 % cream Commonly known as:  ASPERCREME Apply thin film topically to right ankle and 2nd right toe 3 times a day for pain   UNABLE TO FIND Med Name: Epsom salt crystals 100% : Dissolve 1/4 cup in very warm water in basin. Soak LEFT hand until water cools or at least 20 minutes. BID. Then Scrub L- middle finger with Betadine, apply Bactroban and bandaid   Vitamin D3 2000 units capsule Take 2,000 Units by mouth daily.       Review of Systems  Constitutional: Negative for activity change, appetite change, chills, diaphoresis and fever.  HENT: Negative for congestion, mouth sores, nosebleeds, postnasal drip, sneezing, sore throat, trouble swallowing and voice change.   Respiratory: Negative for apnea, cough, choking, chest tightness, shortness of breath and wheezing.   Cardiovascular: Negative for chest pain, palpitations and leg swelling.  Gastrointestinal: Negative for abdominal distention, abdominal pain, constipation, diarrhea and nausea.  Genitourinary: Negative for difficulty urinating, dysuria, frequency and urgency.  Musculoskeletal: Positive for arthralgias (typical arthritis) and joint swelling. Negative for back pain, gait problem and myalgias.  Skin: Positive for wound. Negative for color change, pallor and rash.  Neurological: Negative for dizziness, tremors, syncope, speech difficulty, weakness, numbness and headaches.  Psychiatric/Behavioral: Negative for agitation and behavioral problems.  All other systems reviewed and are negative.   Immunization History  Administered Date(s) Administered  . Influenza,inj,Quad PF,6+ Mos 01/28/2015  . Influenza-Unspecified 02/14/2016  . PPD Test 12/28/2016   Pertinent  Health Maintenance Due  Topic Date Due  . FOOT EXAM  03[birth year]31  . OPHTHALMOLOGY EXAM  03[birth year]31  . PNA vac Low Risk Adult (1 of 2 - PCV13) 03/26/1986  . INFLUENZA VACCINE   12/26/2016  . HEMOGLOBIN A1C  03/06/2017  . DEXA SCAN  05/29/2023 (Originally 03/26/1986)   Fall Risk  09/04/2016  Falls in the past year? No   Functional Status Survey:    Vitals:   07/12/17 0851  BP: 112/66  Pulse: 62  Resp: 16  Temp: (!) 96.6 F (35.9 C)  TempSrc: Oral  SpO2: 97%  Weight: 134 lb 9.6 oz (61.1 kg)  Height: '5\' 6"'$  (1.676 m)   Body mass index is 21.73 kg/m. Physical Exam  Constitutional: She is oriented to person, place, and time. Vital signs are normal. She appears well-developed and well-nourished. She is active and cooperative. She does not appear ill. No distress.  HENT:  Head: Normocephalic and atraumatic.  Mouth/Throat: Uvula is midline, oropharynx is clear and moist and mucous membranes are normal. Mucous membranes are not pale, not dry and not cyanotic.  Eyes: Pupils are equal, round, and reactive to light. Conjunctivae, EOM and lids are normal.  Neck: Trachea normal, normal range of motion and full passive range of motion without pain. Neck supple. No JVD present. No tracheal  deviation, no edema and no erythema present. No thyromegaly present.  Cardiovascular: Normal rate, regular rhythm, normal heart sounds, intact distal pulses and normal pulses. Exam reveals no gallop, no distant heart sounds and no friction rub.  No murmur heard. Pulses:      Dorsalis pedis pulses are 2+ on the right side, and 2+ on the left side.  No edema  Pulmonary/Chest: Effort normal and breath sounds normal. No accessory muscle usage. No respiratory distress. She has no decreased breath sounds. She has no wheezes. She has no rhonchi. She has no rales. She exhibits no tenderness.  Abdominal: Soft. Normal appearance and bowel sounds are normal. She exhibits no distension and no ascites. There is no tenderness.  Musculoskeletal: Normal range of motion. She exhibits no edema or tenderness.  Expected osteoarthritis, stiffness; Bilateral Calves soft, supple. Negative Homan's Sign. B-  pedal pulses equal ambulates with Rollator walker;   Neurological: She is alert and oriented to person, place, and time. She has normal strength.  Skin: Skin is warm, dry and intact. She is not diaphoretic. No cyanosis. No pallor. Nails show no clubbing.     Psychiatric: She has a normal mood and affect. Her speech is normal and behavior is normal. Judgment and thought content normal. Cognition and memory are normal.  Nursing note and vitals reviewed.   Labs reviewed: Recent Labs    02/14/17 0435 08/13/17 0700 08/14/17 0400  NA 140 139 140  K 5.1 5.6* 5.3*  CL 112* 110 109  CO2 '23 22 22  '$ GLUCOSE 120* 110* 105*  BUN 39* 29* 32*  CREATININE 1.13* 1.53* 1.51*  CALCIUM 9.3 8.9 8.9  MG 1.9 1.3*  --    Recent Labs    10/05/16 1328 02/14/17 0435 08/13/17 0700  AST 30 34 32  ALT 11* 23 22  ALKPHOS 84 144* 116  BILITOT 1.5* 0.6 0.8  PROT 6.9 6.6 6.1*  ALBUMIN 3.5 3.3* 3.2*   Recent Labs    10/25/16 1135 02/14/17 0435 08/13/17 0700  WBC 5.5 4.5 3.8  NEUTROABS 3.2 2.1 2.0  HGB 12.0 11.3* 10.4*  HCT 37.4 33.5* 31.1*  MCV 88.7 90.4 95.9  PLT 217.0 173 157   Lab Results  Component Value Date   TSH 6.302 (H) 08/13/2017   Lab Results  Component Value Date   HGBA1C 6.7 (H) 09/04/2016   Lab Results  Component Value Date   CHOL 112 08/13/2017   HDL 45 08/13/2017   LDLCALC 39 08/13/2017   LDLDIRECT 58.0 09/04/2016   TRIG 142 08/13/2017   CHOLHDL 2.5 08/13/2017    Significant Diagnostic Results in last 30 days:  No results found.  Assessment/Plan Reece was seen today for medical management of chronic issues.  Diagnoses and all orders for this visit:  Dog bite of finger, subsequent encounter  Skin irritation  Osteoarthritis, unspecified osteoarthritis type, unspecified site   Above listed conditions stable or improving  Continue current medication regimen  Monitor for worsening pain associated with arthritis  Monitor for worsening redness, warmth or  drainage from the finger  Monitor for break in skin integrity of the ankle  Obtain labs prior to next month's visit  Family/ staff Communication:   Total Time:  Documentation:  Face to Face:  Family/Phone:   Labs/tests ordered: CBC, met C, magnesium, vitamin D, vitamin B12, TSH, lipid panel prior to next months assessment  Medication list reviewed and assessed for continued appropriateness. Monthly medication orders reviewed and signed.  Vikki Ports, NP-C  [facility] [address], [place] [address] Cell Phone (Mon-Fri 8am-5pm):  [phone] On Call:  [phone] & follow prompts after 5pm & weekends Office Phone:  [phone] Office Fax:  [phone]

## 2017-08-20 NOTE — Assessment & Plan Note (Signed)
Improving.  Finger is still warm with some edema.  However, the symptoms have improved.  Patient reports pain has improved.  Course of Augmentin extended until 2/20.  Staff continues to do twice daily warm Epsom salt soaks with application of Bactroban ointment.

## 2017-08-20 NOTE — Assessment & Plan Note (Signed)
Stable.  Staff is using a piece of DuoDERM and applying to the right lateral ankle.  This is used to act as a barrier to prevent skin irritation.  Skin on right lateral ankle with intact, no redness, no irritation, no break in skin integrity.  Pain improved.

## 2017-08-26 ENCOUNTER — Encounter
Admission: RE | Admit: 2017-08-26 | Discharge: 2017-08-26 | Disposition: A | Discharge status: Home / Self Care | Payer: Medicare Other | Source: Ambulatory Visit | Attending: Internal Medicine | Admitting: Internal Medicine

## 2017-09-11 ENCOUNTER — Encounter: Payer: Self-pay | Admitting: Gerontology

## 2017-09-11 ENCOUNTER — Non-Acute Institutional Stay: Payer: Medicare Other | Admitting: Gerontology

## 2017-09-11 DIAGNOSIS — R7989 Other specified abnormal findings of blood chemistry: Secondary | ICD-10-CM | POA: Diagnosis not present

## 2017-09-11 DIAGNOSIS — N183 Chronic kidney disease, stage 3 unspecified: Secondary | ICD-10-CM

## 2017-09-11 DIAGNOSIS — E538 Deficiency of other specified B group vitamins: Secondary | ICD-10-CM | POA: Diagnosis not present

## 2017-09-11 NOTE — Assessment & Plan Note (Signed)
Stable. Pt currently on Cyanocobalamin 500 mcg po Q Day. Recent level assessed was 1,919. Medication reduced from 1,000 mcg Q Day.

## 2017-09-11 NOTE — Assessment & Plan Note (Signed)
Stable. Recent level 37.3. On Cholecalciferol 2,000 units po Q Day.

## 2017-09-11 NOTE — Assessment & Plan Note (Signed)
Stable. No current issues. Renally adjust medications as appropriate

## 2017-09-11 NOTE — Progress Notes (Signed)
Location:    [facility]ing Home Room Number: 371I Place of Service:  ALF [phone]) Provider:  Toni Arthurs, NP-C  Leone Haven, MD  Patient Care Team: Leone Haven, MD as PCP - General (Family Medicine)  Extended Emergency Contact Information Primary Emergency Contact: [place] of [place] Phone: [phone] Relation: Niece Secondary Emergency Contact: Charolotte Eke [place] of [place] Phone: [phone] Relation: Nephew  Code Status:  DNR Goals of care: Advanced Directive information Advanced Directives 09/11/2017  Does Patient Have a Medical Advance Directive? Yes  Type of Advance Directive Out of facility DNR (pink MOST or yellow form)  Does patient want to make changes to medical advance directive? No - Patient declined  Copy of [place] in Chart? -  Pre-existing out of facility DNR order (yellow form or pink MOST form) Yellow form placed in chart (order not valid for inpatient use)     Chief Complaint  Patient presents with  . Medical Management of Chronic Issues    Routine Visit    HPI:  Pt is a 84 y.o. female seen today for medical management of chronic diseases.    CKD (chronic kidney disease) stage 3, GFR 30-59 ml/min Stable. No current issues. Renally adjust medications as appropriate  Vitamin B12 deficiency Stable. Pt currently on Cyanocobalamin 500 mcg po Q Day. Recent level assessed was 1,919. Medication reduced from 1,000 mcg Q Day.  Low vitamin D level Stable. Recent level 37.3. On Cholecalciferol 2,000 units po Q Day.     Past Medical History:  Diagnosis Date  . Acute gouty arthritis 11/21/2016  . Aortic atherosclerosis ([place]) 09/04/2016  . Carotid artery disease ([place]) 11/21/2016  . Carotid stenosis 12/03/2015   Advanced heavily calcified atherosclerotic disease at both carotid bifurcations. Stenoses measured at 70% affecting the right proximal ICA and 80% affecting the left proximal ICA.    Marland Kitchen Coronary atherosclerosis 09/04/2016  . Diverticulitis   . DM (diabetes mellitus), type 2 ([place]) 09/04/2016  . Gout   . HTN, goal below 140/90 11/21/2016  . Hyperlipidemia 06/20/2016  . Hypothyroidism, unspecified 11/21/2016  . Primary osteoarthritis 11/21/2016   involving multiple joints  . Stroke [facility])    Past Surgical History:  Procedure Laterality Date  . CATARACT EXTRACTION    . Finger crystal removal     right 3rd  . toe crystal removal     right 1st toe    No Known Allergies  Allergies as of 09/11/2017   No Known Allergies     Medication List        Accurate as of 09/11/17  7:48 PM. Always use your most recent med list.          ASPERCREME 10 % Lotn Generic drug:  Trolamine Salicylate Apply thin film topically to areas of pain 3 times a day as needed   cyanocobalamin 1000 MCG tablet Take 1,000 mcg by mouth daily.   levothyroxine 88 MCG tablet Commonly known as:  SYNTHROID, LEVOTHROID Take 88 mcg by mouth daily before breakfast.   Magnesium Oxide 250 MG Tabs Take 250 mg by mouth daily.   MAPAP ARTHRITIS PAIN 650 MG CR tablet Generic drug:  acetaminophen Take 650 mg 2 (two) times daily by mouth.   pantoprazole 40 MG tablet Commonly known as:  PROTONIX Take 1 tablet (40 mg total) by mouth daily. Switch for any other PPI at similar dose and frequency   RISA-BID PROBIOTIC Tabs Take 1 tablet by mouth 2 (two)  times daily.   traZODone 50 MG tablet Commonly known as:  DESYREL Take 50 mg at bedtime by mouth.   Vitamin D3 2000 units capsule Take 2,000 Units by mouth daily.       Review of Systems  Constitutional: Negative for activity change, appetite change, chills, diaphoresis and fever.  HENT: Negative for congestion, mouth sores, nosebleeds, postnasal drip, sneezing, sore throat, trouble swallowing and voice change.   Respiratory: Negative for apnea, cough, choking, chest tightness, shortness of breath and wheezing.   Cardiovascular: Negative for  chest pain, palpitations and leg swelling.  Gastrointestinal: Negative for abdominal distention, abdominal pain, constipation, diarrhea and nausea.  Genitourinary: Negative for difficulty urinating, dysuria, frequency and urgency.  Musculoskeletal: Positive for arthralgias (typical arthritis). Negative for back pain, gait problem and myalgias.  Skin: Negative for color change, pallor, rash and wound.  Neurological: Positive for weakness. Negative for dizziness, tremors, syncope, speech difficulty, numbness and headaches.  Psychiatric/Behavioral: Negative for agitation and behavioral problems.  All other systems reviewed and are negative.   Immunization History  Administered Date(s) Administered  . Influenza,inj,Quad PF,6+ Mos 01/28/2015  . Influenza-Unspecified 02/14/2016  . PPD Test 12/28/2016   Pertinent  Health Maintenance Due  Topic Date Due  . FOOT EXAM  03/27/[birth year]31  . OPHTHALMOLOGY EXAM  03/27/1931  . PNA vac Low Risk Adult (1 of 2 - PCV13) 03/26/1986  . HEMOGLOBIN A1C  03/06/2017  . DEXA SCAN  05/29/2023 (Originally 03/26/1986)  . INFLUENZA VACCINE  12/26/2017   Fall Risk  09/04/2016  Falls in the past year? No   Functional Status Survey:    Vitals:   09/11/17 1013  BP: (!) 145/88  Pulse: 77  Resp: 20  Temp: (!) 97 F (36.1 C)  TempSrc: Oral  SpO2: 98%  Weight: 131 lb 3.2 oz (59.5 kg)  Height: 5\' 6"  (1.676 m)   Body mass index is 21.18 kg/m. Physical Exam  Constitutional: She is oriented to person, place, and time. Vital signs are normal. She appears well-developed and well-nourished. She is active and cooperative. She does not appear ill. No distress.  HENT:  Head: Normocephalic and atraumatic.  Mouth/Throat: Uvula is midline, oropharynx is clear and moist and mucous membranes are normal. Mucous membranes are not pale, not dry and not cyanotic.  Eyes: Pupils are equal, round, and reactive to light. Conjunctivae, EOM and lids are normal.  Neck: Trachea normal,  normal range of motion and full passive range of motion without pain. Neck supple. No JVD present. No tracheal deviation, no edema and no erythema present. No thyromegaly present.  Cardiovascular: Normal rate, regular rhythm, normal heart sounds, intact distal pulses and normal pulses. Exam reveals no gallop, no distant heart sounds and no friction rub.  No murmur heard. Pulses:      Dorsalis pedis pulses are 2+ on the right side, and 2+ on the left side.  No edema  Pulmonary/Chest: Effort normal and breath sounds normal. No accessory muscle usage. No respiratory distress. She has no decreased breath sounds. She has no wheezes. She has no rhonchi. She has no rales. She exhibits no tenderness.  Abdominal: Soft. Normal appearance and bowel sounds are normal. She exhibits no distension and no ascites. There is no tenderness.  Musculoskeletal: Normal range of motion. She exhibits no edema or tenderness.  Expected osteoarthritis, stiffness; Bilateral Calves soft, supple. Negative Homan's Sign. B- pedal pulses equal  Neurological: She is alert and oriented to person, place, and time. She has normal strength.  Skin:  Skin is warm and dry. Abrasion noted. She is not diaphoretic. No cyanosis. No pallor. Nails show no clubbing.     Psychiatric: She has a normal mood and affect. Her speech is normal and behavior is normal. Judgment and thought content normal. Cognition and memory are normal.  Nursing note and vitals reviewed.   Labs reviewed: Recent Labs    02/14/17 0435 08/13/17 0700 08/14/17 0400  NA 140 139 140  K 5.1 5.6* 5.3*  CL 112* 110 109  CO2 23 22 22   GLUCOSE 120* 110* 105*  BUN 39* 29* 32*  CREATININE 1.13* 1.53* 1.51*  CALCIUM 9.3 8.9 8.9  MG 1.9 1.3*  --    Recent Labs    10/05/16 1328 02/14/17 0435 08/13/17 0700  AST 30 34 32  ALT 11* 23 22  ALKPHOS 84 144* 116  BILITOT 1.5* 0.6 0.8  PROT 6.9 6.6 6.1*  ALBUMIN 3.5 3.3* 3.2*   Recent Labs    10/25/16 1135  02/14/17 0435 08/13/17 0700  WBC 5.5 4.5 3.8  NEUTROABS 3.2 2.1 2.0  HGB 12.0 11.3* 10.4*  HCT 37.4 33.5* 31.1*  MCV 88.7 90.4 95.9  PLT 217.0 173 157   Lab Results  Component Value Date   TSH 6.302 (H) 08/13/2017   Lab Results  Component Value Date   HGBA1C 6.7 (H) 09/04/2016   Lab Results  Component Value Date   CHOL 112 08/13/2017   HDL 45 08/13/2017   LDLCALC 39 08/13/2017   LDLDIRECT 58.0 09/04/2016   TRIG 142 08/13/2017   CHOLHDL 2.5 08/13/2017    Significant Diagnostic Results in last 30 days:  No results found.  Assessment/Plan Ashley [name] was seen today for medical management of chronic issues.  Diagnoses and all orders for this visit:  CKD (chronic kidney disease) stage 3, GFR 30-59 ml/min (HCC)  Vitamin B12 deficiency  Low vitamin D level   Above listed conditions stable  Continue current medication regimen  Renally adjust medication as appropriate  Continue to ambulate pt daily and prn  Continue to encourage and assist pt to visit her sister daily  Family/ staff Communication:   Total Time:  Documentation:  Face to Face:  Family/Phone:   Labs/tests ordered:  Not due  Medication list reviewed and assessed for continued appropriateness. Monthly medication orders reviewed and signed.  Vikki Ports, NP-C Geriatrics [facility] [phone], [place] [address] Cell Phone (Mon-Fri 8am-5pm):  [phone] On Call:  [phone] & follow prompts after 5pm & weekends Office Phone:  [phone] Office Fax:  [phone]

## 2017-09-19 ENCOUNTER — Encounter: Payer: Self-pay | Admitting: Gerontology

## 2017-09-19 ENCOUNTER — Non-Acute Institutional Stay: Payer: Medicare Other | Admitting: Gerontology

## 2017-09-19 DIAGNOSIS — I739 Peripheral vascular disease, unspecified: Secondary | ICD-10-CM

## 2017-09-19 DIAGNOSIS — L97909 Non-pressure chronic ulcer of unspecified part of unspecified lower leg with unspecified severity: Secondary | ICD-10-CM | POA: Insufficient documentation

## 2017-09-19 NOTE — Progress Notes (Signed)
Location:   [facility] [place] Room Number: [phone] Place of Service:  ALF [phone]) Provider:  Toni Arthurs, NP-C  Leone Haven, MD  Patient Care Team: Leone Haven, MD as PCP - General (Family Medicine)  Extended Emergency Contact Information Primary Emergency Contact: [place] of [place] Phone: [phone] Relation: Niece Secondary Emergency Contact: Charolotte Eke [place] of [place] Phone: [phone] Relation: Nephew  Code Status:  DNR Goals of care: Advanced Directive information Advanced Directives 09/19/2017  Does Patient Have a Medical Advance Directive? Yes  Type of Advance Directive Out of facility DNR (pink MOST or yellow form)  Does patient want to make changes to medical advance directive? No - Patient declined  Copy of [place] in Chart? -  Pre-existing out of facility DNR order (yellow form or pink MOST form) Yellow form placed in chart (order not valid for inpatient use)     Chief Complaint  Patient presents with  . Acute Visit    Recheck wound    HPI:  Pt is a 84 y.o. female seen today for an acute visit for evaluation of wound on the leg.  Staff noted patient having a small, open area on the posterior-lateral right lower leg that started approximately 4 weeks ago.  Staff has been cleaning the area and keeping covered with an Allevyn dressing.  However, the peri-wound tissue now has erythema, mild redness.  Wound measures 2.0 x 1.5 cm, draining small amount of serosanguineous fluid.  No odor.  Wound bed 60% slough, 40% pink.  Patient denies trauma, abrasion or laceration to the area.  She reports it "just appeared one day."  Patient reports the area is mildly tender, but not overly painful.  Bilateral pedal pulses equal and strong.  Skin to BLE shiny and hairless.  Patient is afebrile.  Area does not appear infected at this point.  Will address dressing changes.  Follow-up next  week.    Past Medical History:  Diagnosis Date  . Acute gouty arthritis 11/21/2016  . Aortic atherosclerosis ([place]) 09/04/2016  . Carotid artery disease ([place]) 11/21/2016  . Carotid stenosis 12/03/2015   Advanced heavily calcified atherosclerotic disease at both carotid bifurcations. Stenoses measured at 70% affecting the right proximal ICA and 80% affecting the left proximal ICA.  Marland Kitchen Coronary atherosclerosis 09/04/2016  . Diverticulitis   . DM (diabetes mellitus), type 2 ([place]) 09/04/2016  . Gout   . HTN, goal below 140/90 11/21/2016  . Hyperlipidemia 06/20/2016  . Hypothyroidism, unspecified 11/21/2016  . Primary osteoarthritis 11/21/2016   involving multiple joints  . Stroke [facility])    Past Surgical History:  Procedure Laterality Date  . CATARACT EXTRACTION    . Finger crystal removal     right 3rd  . toe crystal removal     right 1st toe    No Known Allergies  Allergies as of 09/19/2017   No Known Allergies     Medication List        Accurate as of 09/19/17  1:24 PM. Always use your most recent med list.          ASPERCREME 10 % Lotn Generic drug:  Trolamine Salicylate Apply thin film topically to areas of pain 3 times a day as needed   collagenase ointment Commonly known as:  SANTYL Cleanse right leg wound with ns, skin prep, around wound, apply santyl nickel thick to wound base, cover with ns moistened gauze, cover with allevyn dressing  levothyroxine 88 MCG tablet Commonly known as:  SYNTHROID, LEVOTHROID Take 88 mcg by mouth daily before breakfast.   Magnesium Oxide 250 MG Tabs Take 250 mg by mouth daily.   MAPAP ARTHRITIS PAIN 650 MG CR tablet Generic drug:  acetaminophen Take 650 mg 2 (two) times daily by mouth.   pantoprazole 40 MG tablet Commonly known as:  PROTONIX Take 1 tablet (40 mg total) by mouth daily. Switch for any other PPI at similar dose and frequency   RISA-BID PROBIOTIC Tabs Take 1 tablet by mouth 2 (two) times daily.   traZODone 50  MG tablet Commonly known as:  DESYREL Take 50 mg at bedtime by mouth.   vitamin B-12 500 MCG tablet Commonly known as:  CYANOCOBALAMIN Take 500 mcg by mouth daily.   Vitamin D3 2000 units capsule Take 2,000 Units by mouth daily.       Review of Systems  Constitutional: Negative for activity change, appetite change, chills, diaphoresis, fatigue and fever.  HENT: Negative for congestion, mouth sores, nosebleeds, postnasal drip, sneezing, sore throat, trouble swallowing and voice change.   Respiratory: Negative for apnea, cough, choking, chest tightness, shortness of breath and wheezing.   Cardiovascular: Negative for chest pain, palpitations and leg swelling.  Gastrointestinal: Negative for abdominal distention, abdominal pain, constipation, diarrhea and nausea.  Genitourinary: Negative for difficulty urinating, dysuria, frequency and urgency.  Musculoskeletal: Positive for arthralgias (typical arthritis) and joint swelling (baseline). Negative for back pain, gait problem and myalgias.  Skin: Positive for wound. Negative for color change, pallor and rash.  Neurological: Negative for dizziness, tremors, syncope, speech difficulty, weakness, numbness and headaches.  Psychiatric/Behavioral: Negative for agitation and behavioral problems.  All other systems reviewed and are negative.   Immunization History  Administered Date(s) Administered  . Influenza,inj,Quad PF,6+ Mos 01/28/2015  . Influenza-Unspecified 02/14/2016  . PPD Test 12/28/2016   Pertinent  Health Maintenance Due  Topic Date Due  . FOOT EXAM  03/27/1931  . OPHTHALMOLOGY EXAM  03/27/1931  . URINE MICROALBUMIN  03/27/1931  . PNA vac Low Risk Adult (1 of 2 - PCV13) 03/26/1986  . HEMOGLOBIN A1C  03/06/2017  . DEXA SCAN  05/29/2023 (Originally 03/26/1986)  . INFLUENZA VACCINE  12/26/2017   Fall Risk  09/04/2016  Falls in the past year? No   Functional Status Survey:    Vitals:   09/19/17 1317  BP: (!) 145/88    Pulse: 77  Resp: 20  Temp: (!) 97 F (36.1 C)  TempSrc: Oral  SpO2: 98%  Weight: 131 lb 3.2 oz (59.5 kg)  Height: 5\' 6"  (1.676 m)   Body mass index is 21.18 kg/m. Physical Exam  Constitutional: She is oriented to person, place, and time. Vital signs are normal. She appears well-developed and well-nourished. She is active and cooperative. She does not appear ill. No distress.  HENT:  Head: Normocephalic and atraumatic.  Mouth/Throat: Uvula is midline, oropharynx is clear and moist and mucous membranes are normal. Mucous membranes are not pale, not dry and not cyanotic.  Eyes: Pupils are equal, round, and reactive to light. Conjunctivae, EOM and lids are normal.  Neck: Trachea normal, normal range of motion and full passive range of motion without pain. Neck supple. No JVD present. No tracheal deviation, no edema and no erythema present. No thyromegaly present.  Cardiovascular: Normal rate, regular rhythm, normal heart sounds, intact distal pulses and normal pulses. Exam reveals no gallop, no distant heart sounds and no friction rub.  No murmur heard. Pulses:  Dorsalis pedis pulses are 2+ on the right side, and 2+ on the left side.  No edema, vascular ulceration of RLE  Pulmonary/Chest: Effort normal and breath sounds normal. No accessory muscle usage. No respiratory distress. She has no decreased breath sounds. She has no wheezes. She has no rhonchi. She has no rales. She exhibits no tenderness.  Abdominal: Soft. Normal appearance and bowel sounds are normal. She exhibits no distension and no ascites. There is no tenderness.  Musculoskeletal: Normal range of motion. She exhibits no edema or tenderness.  Expected osteoarthritis, stiffness; Bilateral Calves soft, supple. Negative Homan's Sign. B- pedal pulses equal  Neurological: She is alert and oriented to person, place, and time. She has normal strength.  Skin: Skin is warm, dry and intact. She is not diaphoretic. There is erythema.  No cyanosis. No pallor. Nails show no clubbing.     Psychiatric: She has a normal mood and affect. Her speech is normal and behavior is normal. Judgment and thought content normal. Cognition and memory are normal.  Nursing note and vitals reviewed.   Labs reviewed: Recent Labs    02/14/17 0435 08/13/17 0700 08/14/17 0400  NA 140 139 140  K 5.1 5.6* 5.3*  CL 112* 110 109  CO2 23 22 22   GLUCOSE 120* 110* 105*  BUN 39* 29* 32*  CREATININE 1.13* 1.53* 1.51*  CALCIUM 9.3 8.9 8.9  MG 1.9 1.3*  --    Recent Labs    10/05/16 1328 02/14/17 0435 08/13/17 0700  AST 30 34 32  ALT 11* 23 22  ALKPHOS 84 144* 116  BILITOT 1.5* 0.6 0.8  PROT 6.9 6.6 6.1*  ALBUMIN 3.5 3.3* 3.2*   Recent Labs    10/25/16 1135 02/14/17 0435 08/13/17 0700  WBC 5.5 4.5 3.8  NEUTROABS 3.2 2.1 2.0  HGB 12.0 11.3* 10.4*  HCT 37.4 33.5* 31.1*  MCV 88.7 90.4 95.9  PLT 217.0 173 157   Lab Results  Component Value Date   TSH 6.302 (H) 08/13/2017   Lab Results  Component Value Date   HGBA1C 6.7 (H) 09/04/2016   Lab Results  Component Value Date   CHOL 112 08/13/2017   HDL 45 08/13/2017   LDLCALC 39 08/13/2017   LDLDIRECT 58.0 09/04/2016   TRIG 142 08/13/2017   CHOLHDL 2.5 08/13/2017    Significant Diagnostic Results in last 30 days:  No results found.  Assessment/Plan Peripheral vascular disease of lower extremity with ulceration (HCC)  DC current dressing and dressing change orders  Clean area well with normal saline  Apply Santyl ointment, wet/moist gauze to the wound bed.  Cover with Allevyn dressing.  Change daily  Nursing to notify NP or wound nurse if area has increased erythema or increased drainage  Myself or wound nurse to follow-up and reevaluate next week  Family/ staff Communication:   Total Time:  Documentation:  Face to Face:  Family/Phone:   Labs/tests ordered:    Medication list reviewed and assessed for continued appropriateness.  Vikki Ports,  NP-C Geriatrics [facility] [phone], [place] [address] Cell Phone (Mon-Fri 8am-5pm):  [phone] On Call:  [phone] & follow prompts after 5pm & weekends Office Phone:  [phone] Office Fax:  [phone]

## 2017-09-23 ENCOUNTER — Non-Acute Institutional Stay: Payer: Medicare Other | Admitting: Gerontology

## 2017-09-23 DIAGNOSIS — L97909 Non-pressure chronic ulcer of unspecified part of unspecified lower leg with unspecified severity: Secondary | ICD-10-CM

## 2017-09-23 DIAGNOSIS — I739 Peripheral vascular disease, unspecified: Secondary | ICD-10-CM | POA: Diagnosis not present

## 2017-09-24 ENCOUNTER — Other Ambulatory Visit
Admission: RE | Admit: 2017-09-24 | Discharge: 2017-09-24 | Disposition: A | Discharge status: Home / Self Care | Payer: Medicare Other | Source: Ambulatory Visit | Attending: Gerontology | Admitting: Gerontology

## 2017-09-24 DIAGNOSIS — E039 Hypothyroidism, unspecified: Secondary | ICD-10-CM | POA: Insufficient documentation

## 2017-09-24 LAB — TSH: TSH: 0.747 u[IU]/mL (ref 0.350–4.500)

## 2017-09-24 NOTE — Progress Notes (Signed)
Location:      Place of Service:  ALF (13) Provider:  Toni Arthurs, NP-C  Leone Haven, MD  Patient Care Team: Leone Haven, MD as PCP - General (Family Medicine)  Extended Emergency Contact Information Primary Emergency Contact: [place] Phone: [phone] Relation: Niece Secondary Emergency Contact: [place] Phone: [phone] Relation: Nephew  Code Status:  DNR Goals of care: Advanced Directive information Advanced Directives 09/19/2017  Does Patient Have a Medical Advance Directive? Yes  Type of Advance Directive Out of facility DNR (pink MOST or yellow form)  Does patient want to make changes to medical advance directive? No - Patient declined  Copy of [place] in Chart? -  Pre-existing out of facility DNR order (yellow form or pink MOST form) Yellow form placed in chart (order not valid for inpatient use)     Chief Complaint  Patient presents with  . Follow-up    Vasular ulcer    HPI:  Pt is a 84 y.o. female seen today for medical management of chronic diseases.    Peripheral vascular disease of lower extremity with ulceration (HCC) Re-assessment of Vascular Ulcer. Wound now has approx 20% moist slough. Wound bed pink. No frank bleeding. Mild maceration at wound borders. No s/s of infection. Wound measurements remain 2.0 x 1.5 cm. Continue use of Santyl ointment for now, until all slough is gone. Continues to have minimal pain. B- pulses equal and strong.     Past Medical History:  Diagnosis Date  . Acute gouty arthritis 11/21/2016  . Aortic atherosclerosis ([place]) 09/04/2016  . Carotid artery disease ([place]) 11/21/2016  . Carotid stenosis 12/03/2015   Advanced heavily calcified atherosclerotic disease at both carotid bifurcations. Stenoses measured at 70% affecting the right proximal ICA and 80% affecting the left proximal ICA.  Marland Kitchen Coronary atherosclerosis  09/04/2016  . Diverticulitis   . DM (diabetes mellitus), type 2 ([place]) 09/04/2016  . Gout   . HTN, goal below 140/90 11/21/2016  . Hyperlipidemia 06/20/2016  . Hypothyroidism, unspecified 11/21/2016  . Primary osteoarthritis 11/21/2016   involving multiple joints  . Stroke [facility])    Past Surgical History:  Procedure Laterality Date  . CATARACT EXTRACTION    . Finger crystal removal     right 3rd  . toe crystal removal     right 1st toe    No Known Allergies  Allergies as of 09/23/2017   No Known Allergies     Medication List        Accurate as of 09/23/17 11:59 PM. Always use your most recent med list.          ASPERCREME 10 % Lotn Generic drug:  Trolamine Salicylate Apply thin film topically to areas of pain 3 times a day as needed   collagenase ointment Commonly known as:  SANTYL Cleanse right leg wound with ns, skin prep, around wound, apply santyl nickel thick to wound base, cover with ns moistened gauze, cover with allevyn dressing   levothyroxine 88 MCG tablet Commonly known as:  SYNTHROID, LEVOTHROID Take 88 mcg by mouth daily before breakfast.   Magnesium Oxide 250 MG Tabs Take 250 mg by mouth daily.   MAPAP ARTHRITIS PAIN 650 MG CR tablet Generic drug:  acetaminophen Take 650 mg 2 (two) times daily by mouth.   pantoprazole 40 MG tablet Commonly known as:  PROTONIX Take 1 tablet (40 mg total) by mouth daily. Switch for any other  PPI at similar dose and frequency   RISA-BID PROBIOTIC Tabs Take 1 tablet by mouth 2 (two) times daily.   traZODone 50 MG tablet Commonly known as:  DESYREL Take 50 mg at bedtime by mouth.   vitamin B-12 500 MCG tablet Commonly known as:  CYANOCOBALAMIN Take 500 mcg by mouth daily.   Vitamin D3 2000 units capsule Take 2,000 Units by mouth daily.       Review of Systems  HENT: Negative.   Respiratory: Negative.   Cardiovascular: Negative.   Gastrointestinal: Negative.   Genitourinary: Negative.   Musculoskeletal:  Negative.   Skin: Positive for wound.  Neurological: Negative.   Psychiatric/Behavioral: Negative.     Immunization History  Administered Date(s) Administered  . Influenza,inj,Quad PF,6+ Mos 01/28/2015  . Influenza-Unspecified 02/14/2016  . PPD Test 12/28/2016   Pertinent  Health Maintenance Due  Topic Date Due  . FOOT EXAM  03[birth year]31  . OPHTHALMOLOGY EXAM  03/27/1931  . URINE MICROALBUMIN  03/27/1931  . PNA vac Low Risk Adult (1 of 2 - PCV13) 03/26/1986  . HEMOGLOBIN A1C  03/06/2017  . DEXA SCAN  05/29/2023 (Originally 03/26/1986)  . INFLUENZA VACCINE  12/26/2017   Fall Risk  09/04/2016  Falls in the past year? No   Functional Status Survey:    There were no vitals filed for this visit. There is no height or weight on file to calculate BMI. Physical Exam  Constitutional: She is oriented to person, place, and time. She appears well-developed and well-nourished. No distress.  Pulmonary/Chest: Effort normal.  Musculoskeletal: She exhibits tenderness (mild RLE tenderness). She exhibits no edema.  Neurological: She is alert and oriented to person, place, and time.  Skin:     Nursing note and vitals reviewed.   Labs reviewed: Recent Labs    02/14/17 0435 08/13/17 0700 08/14/17 0400  NA 140 139 140  K 5.1 5.6* 5.3*  CL 112* 110 109  CO2 23 22 22   GLUCOSE 120* 110* 105*  BUN 39* 29* 32*  CREATININE 1.13* 1.53* 1.51*  CALCIUM 9.3 8.9 8.9  MG 1.9 1.3*  --    Recent Labs    10/05/16 1328 02/14/17 0435 08/13/17 0700  AST 30 34 32  ALT 11* 23 22  ALKPHOS 84 144* 116  BILITOT 1.5* 0.6 0.8  PROT 6.9 6.6 6.1*  ALBUMIN 3.5 3.3* 3.2*   Recent Labs    10/25/16 1135 02/14/17 0435 08/13/17 0700  WBC 5.5 4.5 3.8  NEUTROABS 3.2 2.1 2.0  HGB 12.0 11.3* 10.4*  HCT 37.4 33.5* 31.1*  MCV 88.7 90.4 95.9  PLT 217.0 173 157   Lab Results  Component Value Date   TSH 0.747 09/24/2017   Lab Results  Component Value Date   HGBA1C 6.7 (H) 09/04/2016   Lab  Results  Component Value Date   CHOL 112 08/13/2017   HDL 45 08/13/2017   LDLCALC 39 08/13/2017   LDLDIRECT 58.0 09/04/2016   TRIG 142 08/13/2017   CHOLHDL 2.5 08/13/2017    Significant Diagnostic Results in last 30 days:  No results found.  Assessment/Plan Dorrene was seen today for follow-up.  Diagnoses and all orders for this visit:  Peripheral vascular disease of lower extremity with ulceration ([place])   Continue current wound care orders  Minimize contact of damp gauze with healthy tissue  OK to use Skin-Prep on peri-wound tissue to minimize maceration  Myself or WOC RN to f/u next week  Family/ staff Communication:   Total Time:  Documentation:  Face to Face:  Family/Phone:   Labs/tests ordered:  none  Medication list reviewed and assessed for continued appropriateness. Monthly medication orders reviewed and signed.  Vikki Ports, NP-C Geriatrics [facility] [phone], [place] [address] Cell Phone (Mon-Fri 8am-5pm):  [phone] On Call:  [phone] & follow prompts after 5pm & weekends Office Phone:  [phone] Office Fax:  [phone]

## 2017-09-24 NOTE — Assessment & Plan Note (Signed)
Re-assessment of Vascular Ulcer. Wound now has approx 20% moist slough. Wound bed pink. No frank bleeding. Mild maceration at wound borders. No s/s of infection. Wound measurements remain 2.0 x 1.5 cm. Continue use of Santyl ointment for now, until all slough is gone. Continues to have minimal pain. B- pulses equal and strong.

## 2017-09-25 ENCOUNTER — Encounter
Admission: RE | Admit: 2017-09-25 | Discharge: 2017-09-25 | Disposition: A | Discharge status: Home / Self Care | Payer: Medicare Other | Source: Ambulatory Visit | Attending: Internal Medicine | Admitting: Internal Medicine

## 2017-10-02 ENCOUNTER — Non-Acute Institutional Stay: Payer: Medicare Other | Admitting: Gerontology

## 2017-10-02 DIAGNOSIS — L97909 Non-pressure chronic ulcer of unspecified part of unspecified lower leg with unspecified severity: Secondary | ICD-10-CM

## 2017-10-02 DIAGNOSIS — L03119 Cellulitis of unspecified part of limb: Secondary | ICD-10-CM

## 2017-10-02 DIAGNOSIS — I739 Peripheral vascular disease, unspecified: Secondary | ICD-10-CM

## 2017-10-02 NOTE — Progress Notes (Signed)
Location:      Place of Service:  ALF (13) Provider:  Toni Arthurs, NP-C  Leone Haven, MD  Patient Care Team: Leone Haven, MD as PCP - General (Family Medicine)  Extended Emergency Contact Information Primary Emergency Contact: [place] of [place] Phone: [phone] Relation: Niece Secondary Emergency Contact: [place] Phone: [phone] Relation: Nephew  Code Status:  DNR Goals of care: Advanced Directive information Advanced Directives 09/19/2017  Does Patient Have a Medical Advance Directive? Yes  Type of Advance Directive Out of facility DNR (pink MOST or yellow form)  Does patient want to make changes to medical advance directive? No - Patient declined  Copy of [place] in Chart? -  Pre-existing out of facility DNR order (yellow form or pink MOST form) Yellow form placed in chart (order not valid for inpatient use)     Chief Complaint  Patient presents with  . Acute Visit    HPI:  Pt is a 84 y.o. female seen today for an acute visit for evaluation of peripheral Vascular Ulceration. Wound was healing nicely. Was clean, without slough, without redness or drainage. Pt removed dressing last night and scratched wound. Pt reports it was itching. Wound now inflamed. Peri-wound tissue red, warm to touch. Now painful at times. Mild drainage from the wound. Two small areas of satellite wounds lateral and distal to wound as a result of trauma from scratching. Wound assessed with WOC RN. Pt is afebrile. Lower leg skin dry and flaky. Otherwise, no other complaints. VSS.     Past Medical History:  Diagnosis Date  . Acute gouty arthritis 11/21/2016  . Aortic atherosclerosis ([place]) 09/04/2016  . Carotid artery disease ([place]) 11/21/2016  . Carotid stenosis 12/03/2015   Advanced heavily calcified atherosclerotic disease at both carotid bifurcations. Stenoses measured at 70% affecting the  right proximal ICA and 80% affecting the left proximal ICA.  Marland Kitchen Coronary atherosclerosis 09/04/2016  . Diverticulitis   . DM (diabetes mellitus), type 2 ([place]) 09/04/2016  . Gout   . HTN, goal below 140/90 11/21/2016  . Hyperlipidemia 06/20/2016  . Hypothyroidism, unspecified 11/21/2016  . Primary osteoarthritis 11/21/2016   involving multiple joints  . Stroke [facility])    Past Surgical History:  Procedure Laterality Date  . CATARACT EXTRACTION    . Finger crystal removal     right 3rd  . toe crystal removal     right 1st toe    No Known Allergies  Allergies as of 10/02/2017   No Known Allergies     Medication List        Accurate as of 10/02/17  3:38 PM. Always use your most recent med list.          ASPERCREME 10 % Lotn Generic drug:  Trolamine Salicylate Apply thin film topically to areas of pain 3 times a day as needed   collagenase ointment Commonly known as:  SANTYL Cleanse right leg wound with ns, skin prep, around wound, apply santyl nickel thick to wound base, cover with ns moistened gauze, cover with allevyn dressing   levothyroxine 88 MCG tablet Commonly known as:  SYNTHROID, LEVOTHROID Take 88 mcg by mouth daily before breakfast.   Magnesium Oxide 250 MG Tabs Take 250 mg by mouth daily.   MAPAP ARTHRITIS PAIN 650 MG CR tablet Generic drug:  acetaminophen Take 650 mg 2 (two) times daily by mouth.   pantoprazole 40 MG tablet Commonly known as:  PROTONIX Take 1 tablet (40 mg total) by mouth daily. Switch for any other PPI at similar dose and frequency   RISA-BID PROBIOTIC Tabs Take 1 tablet by mouth 2 (two) times daily.   traZODone 50 MG tablet Commonly known as:  DESYREL Take 50 mg at bedtime by mouth.   vitamin B-12 500 MCG tablet Commonly known as:  CYANOCOBALAMIN Take 500 mcg by mouth daily.   Vitamin D3 2000 units capsule Take 2,000 Units by mouth daily.       Review of Systems  Constitutional: Negative for activity change, appetite change,  chills, diaphoresis and fever.  HENT: Negative for congestion, mouth sores, nosebleeds, postnasal drip, sneezing, sore throat, trouble swallowing and voice change.   Respiratory: Negative for apnea, cough, choking, chest tightness, shortness of breath and wheezing.   Cardiovascular: Negative for chest pain, palpitations and leg swelling.  Gastrointestinal: Negative for abdominal distention, abdominal pain, constipation, diarrhea and nausea.  Genitourinary: Negative for difficulty urinating, dysuria, frequency and urgency.  Musculoskeletal: Positive for arthralgias (typical arthritis). Negative for back pain, gait problem and myalgias.  Skin: Positive for wound. Negative for color change, pallor and rash.  Neurological: Positive for weakness. Negative for dizziness, tremors, syncope, speech difficulty, numbness and headaches.  Psychiatric/Behavioral: Negative for agitation and behavioral problems.  All other systems reviewed and are negative.   Immunization History  Administered Date(s) Administered  . Influenza,inj,Quad PF,6+ Mos 01/28/2015  . Influenza-Unspecified 02/14/2016  . PPD Test 12/28/2016   Pertinent  Health Maintenance Due  Topic Date Due  . FOOT EXAM  03/27[birth year]1931  . OPHTHALMOLOGY EXAM  03/27/1931  . URINE MICROALBUMIN  03/27/1931  . PNA vac Low Risk Adult (1 of 2 - PCV13) 03/26/1986  . HEMOGLOBIN A1C  03/06/2017  . DEXA SCAN  05/29/2023 (Originally 03/26/1986)  . INFLUENZA VACCINE  12/26/2017   Fall Risk  09/04/2016  Falls in the past year? No   Functional Status Survey:    Vitals:   09/19/17 1300  BP: (!) 143/72  Pulse: 69  Resp: (!) 28  Temp: (!) 95.8 F (35.4 C)  SpO2: 100%  Weight: 131 lb 3.2 oz (59.5 kg)   Body mass index is 21.18 kg/m. Physical Exam  Constitutional: She is oriented to person, place, and time. Vital signs are normal. She appears well-developed and well-nourished. She is active and cooperative. She does not appear ill. No distress.    HENT:  Head: Normocephalic and atraumatic.  Mouth/Throat: Uvula is midline, oropharynx is clear and moist and mucous membranes are normal. Mucous membranes are not pale, not dry and not cyanotic.  Eyes: Pupils are equal, round, and reactive to light. Conjunctivae, EOM and lids are normal.  Neck: Trachea normal, normal range of motion and full passive range of motion without pain. Neck supple. No JVD present. No tracheal deviation, no edema and no erythema present. No thyromegaly present.  Cardiovascular: Normal rate, regular rhythm, normal heart sounds, intact distal pulses and normal pulses. Exam reveals no gallop, no distant heart sounds and no friction rub.  No murmur heard. Pulses:      Dorsalis pedis pulses are 2+ on the right side, and 2+ on the left side.  No edema  Pulmonary/Chest: Effort normal and breath sounds normal. No accessory muscle usage. No respiratory distress. She has no decreased breath sounds. She has no wheezes. She has no rhonchi. She has no rales. She exhibits no tenderness.  Abdominal: Soft. Normal appearance and bowel sounds are normal. She exhibits no distension and  no ascites. There is no tenderness.  Musculoskeletal: Normal range of motion. She exhibits no edema or tenderness.  Expected osteoarthritis, stiffness; Bilateral Calves soft, supple. Negative Homan's Sign. B- pedal pulses equal  Neurological: She is alert and oriented to person, place, and time. She has normal strength.  Skin: Skin is warm and dry. Abrasion and ecchymosis noted. She is not diaphoretic. No cyanosis. No pallor. Nails show no clubbing.     PVD with ulceration. Trauma from scratching  Psychiatric: She has a normal mood and affect. Her speech is normal and behavior is normal. Judgment and thought content normal. Cognition and memory are normal.  Nursing note and vitals reviewed.   Labs reviewed: Recent Labs    02/14/17 0435 08/13/17 0700 08/14/17 0400  NA 140 139 140  K 5.1 5.6* 5.3*   CL 112* 110 109  CO2 23 22 22   GLUCOSE 120* 110* 105*  BUN 39* 29* 32*  CREATININE 1.13* 1.53* 1.51*  CALCIUM 9.3 8.9 8.9  MG 1.9 1.3*  --    Recent Labs    10/05/16 1328 02/14/17 0435 08/13/17 0700  AST 30 34 32  ALT 11* 23 22  ALKPHOS 84 144* 116  BILITOT 1.5* 0.6 0.8  PROT 6.9 6.6 6.1*  ALBUMIN 3.5 3.3* 3.2*   Recent Labs    10/25/16 1135 02/14/17 0435 08/13/17 0700  WBC 5.5 4.5 3.8  NEUTROABS 3.2 2.1 2.0  HGB 12.0 11.3* 10.4*  HCT 37.4 33.5* 31.1*  MCV 88.7 90.4 95.9  PLT 217.0 173 157   Lab Results  Component Value Date   TSH 0.747 09/24/2017   Lab Results  Component Value Date   HGBA1C 6.7 (H) 09/04/2016   Lab Results  Component Value Date   CHOL 112 08/13/2017   HDL 45 08/13/2017   LDLCALC 39 08/13/2017   LDLDIRECT 58.0 09/04/2016   TRIG 142 08/13/2017   CHOLHDL 2.5 08/13/2017    Significant Diagnostic Results in last 30 days:  No results found.  Assessment/Plan Jailene was seen today for acute visit.  Diagnoses and all orders for this visit:  Peripheral vascular disease of lower extremity with ulceration (HCC)  Cellulitis of lower leg    DC Santyl  Silvadene cream Q Day  Cleanse wound with NS  Skin prep around the wound  Apply thin film of silvadene cream to wound bed  Cover with 4x4 gauze dressing  Wrap loosely with Acewrap or cover with Tegaderm if pt tries to take ace off  Cephalexin 500 mg po Q 6 hours x 7 days  Follow up next week by [place] RN  Sarna anti-itch lotion- apply to the leg around the dressing for itching BID and prn  Family/ staff Communication:   Total Time:  Documentation:  Face to Face:  Family/Phone:   Labs/tests ordered:    Medication list reviewed and assessed for continued appropriateness.  Vikki Ports, NP-C Geriatrics [facility] [phone], [place] [address] Cell Phone (Mon-Fri 8am-5pm):  [phone] On Call:  [phone] & follow  prompts after 5pm & weekends Office Phone:  [phone] Office Fax:  [phone]

## 2017-10-04 DIAGNOSIS — E1169 Type 2 diabetes mellitus with other specified complication: Secondary | ICD-10-CM | POA: Diagnosis not present

## 2017-10-04 DIAGNOSIS — L97819 Non-pressure chronic ulcer of other part of right lower leg with unspecified severity: Secondary | ICD-10-CM | POA: Diagnosis not present

## 2017-10-04 DIAGNOSIS — L97212 Non-pressure chronic ulcer of right calf with fat layer exposed: Secondary | ICD-10-CM | POA: Diagnosis not present

## 2017-10-04 DIAGNOSIS — L97509 Non-pressure chronic ulcer of other part of unspecified foot with unspecified severity: Secondary | ICD-10-CM | POA: Diagnosis not present

## 2017-10-04 DIAGNOSIS — N183 Chronic kidney disease, stage 3 (moderate): Secondary | ICD-10-CM | POA: Diagnosis not present

## 2017-10-04 DIAGNOSIS — Z8673 Personal history of transient ischemic attack (TIA), and cerebral infarction without residual deficits: Secondary | ICD-10-CM | POA: Diagnosis not present

## 2017-10-04 DIAGNOSIS — L97512 Non-pressure chronic ulcer of other part of right foot with fat layer exposed: Secondary | ICD-10-CM | POA: Diagnosis not present

## 2017-10-04 DIAGNOSIS — I7025 Atherosclerosis of native arteries of other extremities with ulceration: Secondary | ICD-10-CM | POA: Diagnosis present

## 2017-10-04 DIAGNOSIS — E039 Hypothyroidism, unspecified: Secondary | ICD-10-CM | POA: Diagnosis not present

## 2017-10-04 DIAGNOSIS — Z01818 Encounter for other preprocedural examination: Secondary | ICD-10-CM | POA: Diagnosis present

## 2017-10-04 DIAGNOSIS — Z7902 Long term (current) use of antithrombotics/antiplatelets: Secondary | ICD-10-CM | POA: Diagnosis not present

## 2017-10-04 DIAGNOSIS — Z66 Do not resuscitate: Secondary | ICD-10-CM | POA: Diagnosis not present

## 2017-10-04 DIAGNOSIS — Z8249 Family history of ischemic heart disease and other diseases of the circulatory system: Secondary | ICD-10-CM | POA: Diagnosis not present

## 2017-10-04 DIAGNOSIS — Z8261 Family history of arthritis: Secondary | ICD-10-CM | POA: Diagnosis not present

## 2017-10-04 DIAGNOSIS — Z9849 Cataract extraction status, unspecified eye: Secondary | ICD-10-CM | POA: Diagnosis not present

## 2017-10-04 DIAGNOSIS — I70235 Atherosclerosis of native arteries of right leg with ulceration of other part of foot: Secondary | ICD-10-CM | POA: Diagnosis not present

## 2017-10-04 DIAGNOSIS — I96 Gangrene, not elsewhere classified: Secondary | ICD-10-CM | POA: Diagnosis not present

## 2017-10-04 DIAGNOSIS — L97519 Non-pressure chronic ulcer of other part of right foot with unspecified severity: Secondary | ICD-10-CM | POA: Diagnosis not present

## 2017-10-04 DIAGNOSIS — G4700 Insomnia, unspecified: Secondary | ICD-10-CM | POA: Diagnosis not present

## 2017-10-04 DIAGNOSIS — I739 Peripheral vascular disease, unspecified: Secondary | ICD-10-CM | POA: Diagnosis not present

## 2017-10-04 DIAGNOSIS — L97909 Non-pressure chronic ulcer of unspecified part of unspecified lower leg with unspecified severity: Secondary | ICD-10-CM | POA: Diagnosis not present

## 2017-10-04 DIAGNOSIS — L97516 Non-pressure chronic ulcer of other part of right foot with bone involvement without evidence of necrosis: Secondary | ICD-10-CM | POA: Diagnosis not present

## 2017-10-04 DIAGNOSIS — M150 Primary generalized (osteo)arthritis: Secondary | ICD-10-CM | POA: Diagnosis not present

## 2017-10-04 DIAGNOSIS — E559 Vitamin D deficiency, unspecified: Secondary | ICD-10-CM | POA: Diagnosis not present

## 2017-10-04 DIAGNOSIS — E034 Atrophy of thyroid (acquired): Secondary | ICD-10-CM | POA: Diagnosis not present

## 2017-10-04 DIAGNOSIS — Z79899 Other long term (current) drug therapy: Secondary | ICD-10-CM | POA: Diagnosis not present

## 2017-10-04 DIAGNOSIS — I129 Hypertensive chronic kidney disease with stage 1 through stage 4 chronic kidney disease, or unspecified chronic kidney disease: Secondary | ICD-10-CM | POA: Diagnosis not present

## 2017-10-04 DIAGNOSIS — I2510 Atherosclerotic heart disease of native coronary artery without angina pectoris: Secondary | ICD-10-CM | POA: Diagnosis not present

## 2017-10-04 DIAGNOSIS — M869 Osteomyelitis, unspecified: Secondary | ICD-10-CM | POA: Diagnosis not present

## 2017-10-04 DIAGNOSIS — Z86718 Personal history of other venous thrombosis and embolism: Secondary | ICD-10-CM | POA: Diagnosis not present

## 2017-10-04 DIAGNOSIS — E1151 Type 2 diabetes mellitus with diabetic peripheral angiopathy without gangrene: Secondary | ICD-10-CM | POA: Diagnosis not present

## 2017-10-04 DIAGNOSIS — Z823 Family history of stroke: Secondary | ICD-10-CM | POA: Diagnosis not present

## 2017-10-04 DIAGNOSIS — I779 Disorder of arteries and arterioles, unspecified: Secondary | ICD-10-CM | POA: Diagnosis not present

## 2017-10-04 DIAGNOSIS — Z7982 Long term (current) use of aspirin: Secondary | ICD-10-CM | POA: Diagnosis not present

## 2017-10-04 DIAGNOSIS — I10 Essential (primary) hypertension: Secondary | ICD-10-CM | POA: Diagnosis not present

## 2017-10-04 DIAGNOSIS — E11622 Type 2 diabetes mellitus with other skin ulcer: Secondary | ICD-10-CM | POA: Diagnosis present

## 2017-10-04 DIAGNOSIS — I6523 Occlusion and stenosis of bilateral carotid arteries: Secondary | ICD-10-CM | POA: Diagnosis not present

## 2017-10-04 DIAGNOSIS — K219 Gastro-esophageal reflux disease without esophagitis: Secondary | ICD-10-CM | POA: Diagnosis not present

## 2017-10-04 DIAGNOSIS — Z9889 Other specified postprocedural states: Secondary | ICD-10-CM | POA: Diagnosis not present

## 2017-10-04 DIAGNOSIS — E1122 Type 2 diabetes mellitus with diabetic chronic kidney disease: Secondary | ICD-10-CM | POA: Diagnosis present

## 2017-10-04 DIAGNOSIS — M1A39X0 Chronic gout due to renal impairment, multiple sites, without tophus (tophi): Secondary | ICD-10-CM | POA: Diagnosis not present

## 2017-10-04 DIAGNOSIS — E785 Hyperlipidemia, unspecified: Secondary | ICD-10-CM | POA: Diagnosis not present

## 2017-10-04 DIAGNOSIS — I70238 Atherosclerosis of native arteries of right leg with ulceration of other part of lower right leg: Secondary | ICD-10-CM | POA: Diagnosis present

## 2017-10-04 DIAGNOSIS — L97511 Non-pressure chronic ulcer of other part of right foot limited to breakdown of skin: Secondary | ICD-10-CM | POA: Diagnosis not present

## 2017-10-04 DIAGNOSIS — Z79890 Hormone replacement therapy (postmenopausal): Secondary | ICD-10-CM | POA: Diagnosis not present

## 2017-10-04 DIAGNOSIS — E11621 Type 2 diabetes mellitus with foot ulcer: Secondary | ICD-10-CM | POA: Diagnosis present

## 2017-10-04 DIAGNOSIS — N189 Chronic kidney disease, unspecified: Secondary | ICD-10-CM | POA: Diagnosis not present

## 2017-10-04 DIAGNOSIS — I70232 Atherosclerosis of native arteries of right leg with ulceration of calf: Secondary | ICD-10-CM | POA: Diagnosis not present

## 2017-10-04 DIAGNOSIS — L97311 Non-pressure chronic ulcer of right ankle limited to breakdown of skin: Secondary | ICD-10-CM | POA: Diagnosis not present

## 2017-10-04 DIAGNOSIS — Z01810 Encounter for preprocedural cardiovascular examination: Secondary | ICD-10-CM | POA: Diagnosis not present

## 2017-10-04 DIAGNOSIS — Z95820 Peripheral vascular angioplasty status with implants and grafts: Secondary | ICD-10-CM | POA: Diagnosis not present

## 2017-10-11 ENCOUNTER — Encounter: Payer: Self-pay | Admitting: Gerontology

## 2017-10-11 ENCOUNTER — Non-Acute Institutional Stay (SKILLED_NURSING_FACILITY): Payer: Medicare Other | Admitting: Gerontology

## 2017-10-11 DIAGNOSIS — Z8673 Personal history of transient ischemic attack (TIA), and cerebral infarction without residual deficits: Secondary | ICD-10-CM | POA: Diagnosis not present

## 2017-10-11 DIAGNOSIS — I739 Peripheral vascular disease, unspecified: Secondary | ICD-10-CM

## 2017-10-11 DIAGNOSIS — M150 Primary generalized (osteo)arthritis: Secondary | ICD-10-CM | POA: Diagnosis not present

## 2017-10-11 DIAGNOSIS — I779 Disorder of arteries and arterioles, unspecified: Secondary | ICD-10-CM | POA: Diagnosis not present

## 2017-10-11 DIAGNOSIS — I10 Essential (primary) hypertension: Secondary | ICD-10-CM | POA: Diagnosis not present

## 2017-10-11 DIAGNOSIS — E785 Hyperlipidemia, unspecified: Secondary | ICD-10-CM | POA: Diagnosis not present

## 2017-10-11 DIAGNOSIS — L97909 Non-pressure chronic ulcer of unspecified part of unspecified lower leg with unspecified severity: Secondary | ICD-10-CM

## 2017-10-11 DIAGNOSIS — E034 Atrophy of thyroid (acquired): Secondary | ICD-10-CM | POA: Diagnosis not present

## 2017-10-15 NOTE — Assessment & Plan Note (Signed)
Improving. Minimal slough. Clean/pink wound bed. Peri-wound tissues no longer red, warm. No drainage. WOC RN updated

## 2017-10-15 NOTE — Assessment & Plan Note (Signed)
Stable. Minimal residuals. Some generalized weakness.

## 2017-10-15 NOTE — Assessment & Plan Note (Signed)
Stable. Reduced. Not on statin/ lipid lowering agents d/t low cholesterol and age.

## 2017-10-15 NOTE — Progress Notes (Signed)
Location:    Nursing Home Room Number: 756E Place of Service:  SNF (31) Provider:  Toni Arthurs, NP-C  Leone Haven, MD  Patient Care Team: Leone Haven, MD as PCP - General (Family Medicine)  Extended Emergency Contact Information Primary Emergency Contact: [place] Phone: [phone] Relation: Niece Secondary Emergency Contact: Charolotte Eke [place] Phone: [phone] Relation: Nephew  Code Status:  DNR Goals of care: Advanced Directive information Advanced Directives 10/11/2017  Does Patient Have a Medical Advance Directive? Yes  Type of Advance Directive Out of facility DNR (pink MOST or yellow form)  Does patient want to make changes to medical advance directive? No - Patient declined  Copy of [place] in Chart? -  Pre-existing out of facility DNR order (yellow form or pink MOST form) Yellow form placed in chart (order not valid for inpatient use)     Chief Complaint  Patient presents with  . Medical Management of Chronic Issues    Routine Visit    HPI:  Pt is a 84 y.o. female seen today for medical management of chronic diseases.    Hyperlipidemia Stable. Reduced. Not on statin/ lipid lowering agents d/t low cholesterol and age.   Peripheral vascular disease of lower extremity with ulceration (HCC) Improving. Minimal slough. Clean/pink wound bed. Peri-wound tissues no longer red, warm. No drainage. WOC RN updated  History of stroke Stable. Minimal residuals. Some generalized weakness.     Past Medical History:  Diagnosis Date  . Acute gouty arthritis 11/21/2016  . Aortic atherosclerosis ([place]) 09/04/2016  . Carotid artery disease ([place]) 11/21/2016  . Carotid stenosis 12/03/2015   Advanced heavily calcified atherosclerotic disease at both carotid bifurcations. Stenoses measured at 70% affecting the right proximal ICA and 80% affecting the left proximal ICA.  Marland Kitchen  Coronary atherosclerosis 09/04/2016  . Diverticulitis   . DM (diabetes mellitus), type 2 ([place]) 09/04/2016  . Gout   . HTN, goal below 140/90 11/21/2016  . Hyperlipidemia 06/20/2016  . Hypothyroidism, unspecified 11/21/2016  . Primary osteoarthritis 11/21/2016   involving multiple joints  . Stroke [facility])    Past Surgical History:  Procedure Laterality Date  . CATARACT EXTRACTION    . Finger crystal removal     right 3rd  . toe crystal removal     right 1st toe    No Known Allergies  Allergies as of 10/11/2017   No Known Allergies     Medication List        Accurate as of 10/11/17 11:59 PM. Always use your most recent med list.          ASPERCREME 10 % Lotn Generic drug:  Trolamine Salicylate Apply thin film topically to areas of pain 3 times a day as needed   collagenase ointment Commonly known as:  SANTYL Cleanse right leg wound with ns, skin prep, around wound, apply santyl nickel thick to wound base, cover with ns moistened gauze, cover with allevyn dressing   ENSURE Take 237 mLs by mouth daily.   levothyroxine 88 MCG tablet Commonly known as:  SYNTHROID, LEVOTHROID Take 88 mcg by mouth daily before breakfast.   magnesium hydroxide 400 MG/5ML suspension Commonly known as:  MILK OF MAGNESIA Take 30 mLs by mouth every 4 (four) hours as needed for mild constipation. Constipation/ no BM for 2 days   Magnesium Oxide 250 MG Tabs Take 250 mg by mouth daily.   MAPAP ARTHRITIS PAIN 650 MG CR  tablet Generic drug:  acetaminophen Take 650 mg 2 (two) times daily by mouth.   pantoprazole 40 MG tablet Commonly known as:  PROTONIX Take 1 tablet (40 mg total) by mouth daily. Switch for any other PPI at similar dose and frequency   RISA-BID PROBIOTIC Tabs Take 1 tablet by mouth 2 (two) times daily.   traZODone 50 MG tablet Commonly known as:  DESYREL Take 50 mg at bedtime by mouth.   vitamin B-12 500 MCG tablet Commonly known as:  CYANOCOBALAMIN Take 500 mcg by  mouth daily.   Vitamin D3 2000 units capsule Take 2,000 Units by mouth daily.       Review of Systems  Constitutional: Negative for activity change, appetite change, chills, diaphoresis and fever.  HENT: Negative for congestion, mouth sores, nosebleeds, postnasal drip, sneezing, sore throat, trouble swallowing and voice change.   Respiratory: Negative for apnea, cough, choking, chest tightness, shortness of breath and wheezing.   Cardiovascular: Negative for chest pain, palpitations and leg swelling.  Gastrointestinal: Negative for abdominal distention, abdominal pain, constipation, diarrhea and nausea.  Genitourinary: Negative for difficulty urinating, dysuria, frequency and urgency.  Musculoskeletal: Negative for back pain, gait problem and myalgias. Arthralgias: typical arthritis.  Skin: Positive for wound. Negative for color change, pallor and rash.  Neurological: Positive for weakness. Negative for dizziness, tremors, syncope, speech difficulty, numbness and headaches.  Psychiatric/Behavioral: Negative for agitation and behavioral problems.  All other systems reviewed and are negative.   Immunization History  Administered Date(s) Administered  . Influenza,inj,Quad PF,6+ Mos 01/28/2015  . Influenza-Unspecified 02/14/2016  . PPD Test 12/28/2016   Pertinent  Health Maintenance Due  Topic Date Due  . FOOT EXAM  03/27/1929  . OPHTHALMOLOGY EXAM  03/27/1931  . URINE MICROALBUMIN  03/27/1931  . PNA vac Low Risk Adult (1 of 2 - PCV13) 03/26/1986  . HEMOGLOBIN A1C  03/06/2017  . DEXA SCAN  05/29/2023 (Originally 03/26/1986)  . INFLUENZA VACCINE  12/26/2017   Fall Risk  09/04/2016  Falls in the past year? No   Functional Status Survey:    Vitals:   10/11/17 1123  BP: 137/67  Pulse: 87  Resp: 20  Temp: (!) 97.3 F (36.3 C)  TempSrc: Oral  SpO2: 100%  Weight: 131 lb 3.2 oz (59.5 kg)  Height: 5\' 6"  (1.676 m)   Body mass index is 21.18 kg/m. Physical Exam    Constitutional: She is oriented to person, place, and time. Vital signs are normal. She appears well-developed and well-nourished. She is active and cooperative. She does not appear ill. No distress.  HENT:  Head: Normocephalic and atraumatic.  Right Ear: Decreased hearing is noted.  Left Ear: Decreased hearing is noted.  Mouth/Throat: Uvula is midline, oropharynx is clear and moist and mucous membranes are normal. Mucous membranes are not pale, not dry and not cyanotic.  Eyes: Pupils are equal, round, and reactive to light. Conjunctivae, EOM and lids are normal.  Neck: Trachea normal, normal range of motion and full passive range of motion without pain. Neck supple. No JVD present. No tracheal deviation, no edema and no erythema present. No thyromegaly present.  Cardiovascular: Normal rate, regular rhythm, normal heart sounds, intact distal pulses and normal pulses. Exam reveals no gallop, no distant heart sounds and no friction rub.  No murmur heard. Pulses:      Dorsalis pedis pulses are 2+ on the right side, and 2+ on the left side.  No edema  Pulmonary/Chest: Effort normal and breath sounds normal. No  accessory muscle usage. No respiratory distress. She has no decreased breath sounds. She has no wheezes. She has no rhonchi. She has no rales. She exhibits no tenderness.  Abdominal: Soft. Normal appearance and bowel sounds are normal. She exhibits no distension and no ascites. There is no tenderness.  Musculoskeletal: Normal range of motion. She exhibits no edema or tenderness.  Expected osteoarthritis, stiffness; Bilateral Calves soft, supple. Negative Homan's Sign. B- pedal pulses equal; increased generalized weakness, mobile with rolator  Neurological: She is alert and oriented to person, place, and time. She has normal strength. Gait abnormal.  HOH  Skin: Skin is warm, dry and intact. She is not diaphoretic. No cyanosis. No pallor. Nails show no clubbing.     Psychiatric: She has a  normal mood and affect. Her speech is normal and behavior is normal. Judgment and thought content normal. Cognition and memory are normal.  Nursing note and vitals reviewed.   Labs reviewed: Recent Labs    02/14/17 0435 08/13/17 0700 08/14/17 0400  NA 140 139 140  K 5.1 5.6* 5.3*  CL 112* 110 109  CO2 23 22 22   GLUCOSE 120* 110* 105*  BUN 39* 29* 32*  CREATININE 1.13* 1.53* 1.51*  CALCIUM 9.3 8.9 8.9  MG 1.9 1.3*  --    Recent Labs    02/14/17 0435 08/13/17 0700  AST 34 32  ALT 23 22  ALKPHOS 144* 116  BILITOT 0.6 0.8  PROT 6.6 6.1*  ALBUMIN 3.3* 3.2*   Recent Labs    10/25/16 1135 02/14/17 0435 08/13/17 0700  WBC 5.5 4.5 3.8  NEUTROABS 3.2 2.1 2.0  HGB 12.0 11.3* 10.4*  HCT 37.4 33.5* 31.1*  MCV 88.7 90.4 95.9  PLT 217.0 173 157   Lab Results  Component Value Date   TSH 0.747 09/24/2017   Lab Results  Component Value Date   HGBA1C 6.7 (H) 09/04/2016   Lab Results  Component Value Date   CHOL 112 08/13/2017   HDL 45 08/13/2017   LDLCALC 39 08/13/2017   LDLDIRECT 58.0 09/04/2016   TRIG 142 08/13/2017   CHOLHDL 2.5 08/13/2017    Significant Diagnostic Results in last 30 days:  No results found.  Assessment/Plan Ashley [name] was seen today for medical management of chronic issues.  Diagnoses and all orders for this visit:  Hyperlipidemia, unspecified hyperlipidemia type  Peripheral vascular disease of lower extremity with ulceration ([place])  History of stroke   Above listed conditions stable/ improving  Continue current medication regimen  Pt moved to skill nursing unit from AL d/t increased weakness, increased need for assistance  Assist with ADLs as appropriate  Monitor for depressive symptoms r/t move  Safety precautions  Fall precautions  Wound care per orders  Family/ staff Communication:   Total Time:  Documentation:  Face to Face:  Family/Phone:   Labs/tests ordered:  Not due. Recent labs reviewed- stable  Medication  list reviewed and assessed for continued appropriateness. Monthly medication orders reviewed and signed.  Vikki Ports, NP-C [facility] [phone], [place] [address] Cell Phone (Mon-Fri 8am-5pm):  [phone] On Call:  [phone] & follow prompts after 5pm & weekends Office Phone:  [phone] Office Fax:  [phone]

## 2017-10-26 ENCOUNTER — Encounter
Admission: RE | Admit: 2017-10-26 | Discharge: 2017-10-26 | Disposition: A | Discharge status: Home / Self Care | Payer: Medicare Other | Source: Ambulatory Visit | Attending: Internal Medicine | Admitting: Internal Medicine

## 2017-11-12 ENCOUNTER — Non-Acute Institutional Stay (SKILLED_NURSING_FACILITY): Payer: Medicare Other | Admitting: Gerontology

## 2017-11-12 ENCOUNTER — Encounter: Payer: Self-pay | Admitting: Gerontology

## 2017-11-12 DIAGNOSIS — I6523 Occlusion and stenosis of bilateral carotid arteries: Secondary | ICD-10-CM | POA: Diagnosis not present

## 2017-11-12 DIAGNOSIS — I10 Essential (primary) hypertension: Secondary | ICD-10-CM | POA: Diagnosis not present

## 2017-11-12 DIAGNOSIS — I2510 Atherosclerotic heart disease of native coronary artery without angina pectoris: Secondary | ICD-10-CM | POA: Diagnosis not present

## 2017-11-25 ENCOUNTER — Encounter
Admission: RE | Admit: 2017-11-25 | Discharge: 2017-11-25 | Disposition: A | Discharge status: Home / Self Care | Payer: Medicare Other | Source: Ambulatory Visit | Attending: Internal Medicine | Admitting: Internal Medicine

## 2017-11-25 NOTE — Assessment & Plan Note (Signed)
Stable.  No recent episodes of hyper or hypotension.  Not on any antihypertensive medications

## 2017-11-25 NOTE — Progress Notes (Signed)
Location:    Nursing Home Room Number: [id number] Place of Service:  SNF (31) Provider:  Toni Arthurs, NP-C  Kirk Ruths, MD  Patient Care Team: Kirk Ruths, MD as PCP - General (Internal Medicine) Toni Arthurs, NP as Nurse Practitioner (Family Medicine)  Extended Emergency Contact Information Primary Emergency Contact: [place] Phone: [phone] Relation: Niece Secondary Emergency Contact: Charolotte Eke [place] Phone: [phone] Relation: Nephew  Code Status: DNR Goals of care: Advanced Directive information Advanced Directives 11/12/2017  Does Patient Have a Medical Advance Directive? Yes  Type of Advance Directive Out of facility DNR (pink MOST or yellow form)  Does patient want to make changes to medical advance directive? No - Patient declined  Copy of [place] in Chart? -  Pre-existing out of facility DNR order (yellow form or pink MOST form) Yellow form placed in chart (order not valid for inpatient use)     Chief Complaint  Patient presents with  . Medical Management of Chronic Issues    Routine Visit    HPI:  Pt is a 84 y.o. female seen today for medical management of chronic diseases.    Atherosclerotic heart disease of native coronary artery without angina pectoris Stable.  Patient denies chest pain or shortness of breath.  Patient does endorse increased fatigue as of late.  Carotid stenosis Stable.  Denies headache, blurred vision, chest pain, shortness of breath.  No treatment at this time.  Essential hypertension Stable.  No recent episodes of hyper or hypotension.  Not on any antihypertensive medications    Past Medical History:  Diagnosis Date  . Acute gouty arthritis 11/21/2016  . Aortic atherosclerosis ([place]) 09/04/2016  . Carotid artery disease ([place]) 11/21/2016  . Carotid stenosis 12/03/2015   Advanced heavily calcified atherosclerotic disease at  both carotid bifurcations. Stenoses measured at 70% affecting the right proximal ICA and 80% affecting the left proximal ICA.  Marland Kitchen Coronary atherosclerosis 09/04/2016  . Diverticulitis   . DM (diabetes mellitus), type 2 ([place]) 09/04/2016  . Gout   . HTN, goal below 140/90 11/21/2016  . Hyperlipidemia 06/20/2016  . Hypothyroidism, unspecified 11/21/2016  . Primary osteoarthritis 11/21/2016   involving multiple joints  . Stroke [facility])    Past Surgical History:  Procedure Laterality Date  . CATARACT EXTRACTION    . Finger crystal removal     right 3rd  . toe crystal removal     right 1st toe    No Known Allergies  Allergies as of 11/12/2017   No Known Allergies     Medication List        Accurate as of 11/12/17 11:59 PM. Always use your most recent med list.          ASPERCREME 10 % Lotn Generic drug:  Trolamine Salicylate Apply thin film topically to areas of pain 3 times a day as needed   camphor-menthol lotion Commonly known as:  SARNA Apply liberal amount to leg around the wound/dressing for itching BID and PRN. OK to use other places of itching. DO Not use on the wound. Okay to leave on bedside   collagenase ointment Commonly known as:  SANTYL Cleanse right leg wound with ns, skin prep, around wound, apply santyl nickel thick to wound base, cover with ns moistened gauze, cover with allevyn dressing   ENSURE Take 237 mLs by mouth daily.   gabapentin 100 MG capsule Commonly known as:  NEURONTIN Take 100 mg  by mouth 3 (three) times daily as needed.   levothyroxine 88 MCG tablet Commonly known as:  SYNTHROID, LEVOTHROID Take 88 mcg by mouth daily before breakfast.   magnesium hydroxide 400 MG/5ML suspension Commonly known as:  MILK OF MAGNESIA Take 30 mLs by mouth every 4 (four) hours as needed for mild constipation. Constipation/ no BM for 2 days   Magnesium Oxide 250 MG Tabs Take 250 mg by mouth daily.   MAPAP ARTHRITIS PAIN 650 MG CR tablet Generic drug:   acetaminophen Take 650 mg 2 (two) times daily by mouth.   pantoprazole 40 MG tablet Commonly known as:  PROTONIX Take 1 tablet (40 mg total) by mouth daily. Switch for any other PPI at similar dose and frequency   RISA-BID PROBIOTIC Tabs Take 1 tablet by mouth 2 (two) times daily.   traZODone 50 MG tablet Commonly known as:  DESYREL Take 50 mg at bedtime by mouth.   vitamin B-12 500 MCG tablet Commonly known as:  CYANOCOBALAMIN Take 500 mcg by mouth daily.   Vitamin D3 2000 units capsule Take 2,000 Units by mouth daily.       Review of Systems  Constitutional: Negative for activity change, appetite change, chills, diaphoresis and fever.  HENT: Negative for congestion, mouth sores, nosebleeds, postnasal drip, sneezing, sore throat, trouble swallowing and voice change.   Respiratory: Negative for apnea, cough, choking, chest tightness, shortness of breath and wheezing.   Cardiovascular: Negative for chest pain, palpitations and leg swelling.  Gastrointestinal: Negative for abdominal distention, abdominal pain, constipation, diarrhea and nausea.  Genitourinary: Negative for difficulty urinating, dysuria, frequency and urgency.  Musculoskeletal: Positive for arthralgias (typical arthritis). Negative for back pain, gait problem and myalgias.  Skin: Negative for color change, pallor, rash and wound.  Neurological: Positive for weakness. Negative for dizziness, tremors, syncope, speech difficulty, numbness and headaches.  Psychiatric/Behavioral: Negative for agitation and behavioral problems.  All other systems reviewed and are negative.   Immunization History  Administered Date(s) Administered  . Influenza,inj,Quad PF,6+ Mos 01/28/2015  . Influenza-Unspecified 02/14/2016  . PPD Test 12/28/2016   Pertinent  Health Maintenance Due  Topic Date Due  . FOOT EXAM  03/27/1931  . OPHTHALMOLOGY EXAM  03/27/1931  . URINE MICROALBUMIN  03/27/1931  . PNA vac Low Risk Adult (1 of 2 -  PCV13) 03/26/1986  . HEMOGLOBIN A1C  03/06/2017  . DEXA SCAN  05/29/2023 (Originally 03/26/1986)  . INFLUENZA VACCINE  12/26/2017   Fall Risk  09/04/2016  Falls in the past year? No   Functional Status Survey:    Vitals:   11/12/17 1213  BP: (!) 148/70  Pulse: 71  Resp: 20  Temp: (!) 97.5 F (36.4 C)  TempSrc: Oral  SpO2: 100%  Weight: 133 lb 6.4 oz (60.5 kg)  Height: 5\' 6"  (1.676 m)   Body mass index is 21.53 kg/m. Physical Exam  Constitutional: She is oriented to person, place, and time. Vital signs are normal. She appears well-developed and well-nourished. She is active and cooperative. She does not appear ill. No distress.  HENT:  Head: Normocephalic and atraumatic.  Right Ear: Decreased hearing is noted.  Left Ear: Decreased hearing is noted.  Mouth/Throat: Uvula is midline, oropharynx is clear and moist and mucous membranes are normal. Mucous membranes are not pale, not dry and not cyanotic.  Eyes: Pupils are equal, round, and reactive to light. Conjunctivae, EOM and lids are normal.  Neck: Trachea normal, normal range of motion and full passive range of motion  without pain. Neck supple. No JVD present. No tracheal deviation, no edema and no erythema present. No thyromegaly present.  Cardiovascular: Normal rate, normal heart sounds, intact distal pulses and normal pulses. An irregular rhythm present. Exam reveals no gallop, no distant heart sounds and no friction rub.  No murmur heard. Pulses:      Dorsalis pedis pulses are 2+ on the right side, and 2+ on the left side.  No edema  Pulmonary/Chest: Effort normal and breath sounds normal. No accessory muscle usage. No respiratory distress. She has no decreased breath sounds. She has no wheezes. She has no rhonchi. She has no rales. She exhibits no tenderness.  Abdominal: Soft. Normal appearance and bowel sounds are normal. She exhibits no distension and no ascites. There is no tenderness.  Musculoskeletal: Normal range of  motion. She exhibits no edema or tenderness.  Expected osteoarthritis, stiffness; Bilateral Calves soft, supple. Negative Homan's Sign. B- pedal pulses equal; generalized weakness, mobile with Rollator walker  Neurological: She is alert and oriented to person, place, and time. She displays atrophy. She exhibits abnormal muscle tone. Coordination abnormal.  Skin: Skin is warm, dry and intact. She is not diaphoretic. No cyanosis. No pallor. Nails show no clubbing.  Psychiatric: She has a normal mood and affect. Her speech is normal and behavior is normal. Judgment and thought content normal. Cognition and memory are impaired. She exhibits abnormal recent memory.  Nursing note and vitals reviewed.   Labs reviewed: Recent Labs    02/14/17 0435 08/13/17 0700 08/14/17 0400  NA 140 139 140  K 5.1 5.6* 5.3*  CL 112* 110 109  CO2 23 22 22   GLUCOSE 120* 110* 105*  BUN 39* 29* 32*  CREATININE 1.13* 1.53* 1.51*  CALCIUM 9.3 8.9 8.9  MG 1.9 1.3*  --    Recent Labs    02/14/17 0435 08/13/17 0700  AST 34 32  ALT 23 22  ALKPHOS 144* 116  BILITOT 0.6 0.8  PROT 6.6 6.1*  ALBUMIN 3.3* 3.2*   Recent Labs    02/14/17 0435 08/13/17 0700  WBC 4.5 3.8  NEUTROABS 2.1 2.0  HGB 11.3* 10.4*  HCT 33.5* 31.1*  MCV 90.4 95.9  PLT 173 157   Lab Results  Component Value Date   TSH 0.747 09/24/2017   Lab Results  Component Value Date   HGBA1C 6.7 (H) 09/04/2016   Lab Results  Component Value Date   CHOL 112 08/13/2017   HDL 45 08/13/2017   LDLCALC 39 08/13/2017   LDLDIRECT 58.0 09/04/2016   TRIG 142 08/13/2017   CHOLHDL 2.5 08/13/2017    Significant Diagnostic Results in last 30 days:  No results found.  Assessment/Plan Bianney was seen today for medical management of chronic issues.  Diagnoses and all orders for this visit:  Atherosclerosis of native coronary artery of native heart without angina pectoris  Bilateral carotid artery stenosis  Essential hypertension   Above  listed condition stable  Continue current medication regimen  Continue to encourage patient interaction with other residents and participation in activities  Assist with ADLs as appropriate  Safety precautions  Fall precautions  Monitor vitals per protocol  Family/ staff Communication:   Total Time:  Documentation:  Face to Face:  Family/Phone:   Labs/tests ordered: Not due, recent labs reviewed-stable  Medication list reviewed and assessed for continued appropriateness. Monthly medication orders reviewed and signed.  Vikki Ports, NP-C Geriatrics [facility] [phone] [address] [place], [place]  [address] Cell Phone (Mon-Fri 8am-5pm):  [phone] On Call:  [phone] & follow prompts after 5pm & weekends Office Phone:  [phone] Office Fax:  [phone]

## 2017-11-25 NOTE — Assessment & Plan Note (Signed)
Stable.  Patient denies chest pain or shortness of breath.  Patient does endorse increased fatigue as of late.

## 2017-11-25 NOTE — Assessment & Plan Note (Signed)
Stable.  Denies headache, blurred vision, chest pain, shortness of breath.  No treatment at this time.

## 2017-12-13 ENCOUNTER — Encounter: Payer: Self-pay | Admitting: Adult Health

## 2017-12-13 ENCOUNTER — Non-Acute Institutional Stay (SKILLED_NURSING_FACILITY): Payer: Medicare Other | Admitting: Adult Health

## 2017-12-13 DIAGNOSIS — N183 Chronic kidney disease, stage 3 unspecified: Secondary | ICD-10-CM

## 2017-12-13 DIAGNOSIS — I739 Peripheral vascular disease, unspecified: Secondary | ICD-10-CM

## 2017-12-13 DIAGNOSIS — M150 Primary generalized (osteo)arthritis: Secondary | ICD-10-CM

## 2017-12-13 DIAGNOSIS — E034 Atrophy of thyroid (acquired): Secondary | ICD-10-CM

## 2017-12-13 DIAGNOSIS — L97909 Non-pressure chronic ulcer of unspecified part of unspecified lower leg with unspecified severity: Secondary | ICD-10-CM

## 2017-12-13 DIAGNOSIS — E1122 Type 2 diabetes mellitus with diabetic chronic kidney disease: Secondary | ICD-10-CM | POA: Diagnosis not present

## 2017-12-13 DIAGNOSIS — I2510 Atherosclerotic heart disease of native coronary artery without angina pectoris: Secondary | ICD-10-CM

## 2017-12-13 DIAGNOSIS — M1A39X0 Chronic gout due to renal impairment, multiple sites, without tophus (tophi): Secondary | ICD-10-CM | POA: Diagnosis not present

## 2017-12-13 DIAGNOSIS — M159 Polyosteoarthritis, unspecified: Secondary | ICD-10-CM

## 2017-12-13 NOTE — Progress Notes (Signed)
[facility]   [facility] Room Number: [place] of Service:  SNF ([id number])   CODE STATUS: DNR  No Known Allergies  Chief Complaint  Patient presents with  . Medical Management of Chronic Issues    Cad; hypothyroidism; stage 3 renal disease     HPI:  She is a 84 year old long term resident of this facility being seen for the management of her chronic illnesses; cad; hypothyroidism; ckd stage 3 . She denies any chest pain; no uncontrolled lower extremity pain; no change in appetite denies insomnia. There are no nursing concerns at this time.   Past Medical History:  Diagnosis Date  . Acute gouty arthritis 11/21/2016  . Aortic atherosclerosis ([place]) 09/04/2016  . Carotid artery disease ([place]) 11/21/2016  . Carotid stenosis 12/03/2015   Advanced heavily calcified atherosclerotic disease at both carotid bifurcations. Stenoses measured at 70% affecting the right proximal ICA and 80% affecting the left proximal ICA.  Marland Kitchen Coronary atherosclerosis 09/04/2016  . Diverticulitis   . DM (diabetes mellitus), type 2 ([place]) 09/04/2016  . Gout   . HTN, goal below 140/90 11/21/2016  . Hyperlipidemia 06/20/2016  . Hypothyroidism, unspecified 11/21/2016  . Primary osteoarthritis 11/21/2016   involving multiple joints  . Stroke [facility])     Past Surgical History:  Procedure Laterality Date  . CATARACT EXTRACTION    . Finger crystal removal     right 3rd  . toe crystal removal     right 1st toe    Social History   Socioeconomic History  . Marital status: Widowed    Spouse name: Not on file  . Number of children: 0  . Years of education: 10  . Highest education level: 10th grade  Occupational History  . Not on file  Social Needs  . Financial resource strain: Not on file  . Food insecurity:    Worry: Not on file    Inability: Not on file  . Transportation needs:    Medical: Not on file    Non-medical: Not on file  Tobacco Use  . Smoking status: Never Smoker  .  Smokeless tobacco: Never Used  Substance and Sexual Activity  . Alcohol use: No  . Drug use: No  . Sexual activity: Not Currently  Lifestyle  . Physical activity:    Days per week: Not on file    Minutes per session: Not on file  . Stress: Not on file  Relationships  . Social connections:    Talks on phone: Not on file    Gets together: Not on file    Attends religious service: Not on file    Active member of club or organization: Not on file    Attends meetings of clubs or organizations: Not on file    Relationship status: Not on file  . Intimate partner violence:    Fear of current or ex partner: Not on file    Emotionally abused: Not on file    Physically abused: Not on file    Forced sexual activity: Not on file  Other Topics Concern  . Not on file  Social History Narrative   Admitted to [facility] 11/16/2016   Widowed   No children   Does not drink alcohol   Never smoker   DNR   Family History  Problem Relation Age of Onset  . Hypertension Sister   . Arthritis Sister   . Heart disease Sister   . Alcohol abuse Brother   .  Stroke Brother   . Hypertension Brother   . Alcohol abuse Brother   . Stroke Brother   . Hypertension Brother   . Hypertension Brother   . Hypertension Sister       VITAL SIGNS BP 130/78   Pulse 79   Temp (!) 97.3 F (36.3 C)   Resp 16   Ht 5\' 6"  (1.676 m)   Wt 133 lb 8 oz (60.6 kg)   SpO2 99%   BMI 21.55 kg/m   Outpatient Encounter Medications as of 12/13/2017  Medication Sig  . acetaminophen (MAPAP ARTHRITIS PAIN) 650 MG CR tablet Take 650 mg 2 (two) times daily by mouth.   . camphor-menthol (SARNA) lotion Apply liberal amount to leg around the wound/dressing for itching BID and PRN. OK to use other places of itching. DO Not use on the wound. Okay to leave on bedside  . Cholecalciferol (VITAMIN D3) 2000 units capsule Take 2,000 Units by mouth daily.  . collagenase (SANTYL) ointment Apply topically daily. Cleanse right  leg wound with ns, skin prep, around wound, apply santyl 1/4 in. thick to wound base, cover with ns moistened gauze, cover with allevyn dressing  . ENSURE (ENSURE) Take 237 mLs by mouth daily.  Marland Kitchen gabapentin (NEURONTIN) 100 MG capsule Take 100 mg by mouth 3 (three) times daily as needed.  . Hydroactive Dressings (DUODERM HYDROACTIVE EX) Apply topically. Cut a DuoDerm into 4 pieces . Use 1 at a time. Change Q 5 days or sooner if needed. Using this method for padding bone prominence.  Marland Kitchen levothyroxine (SYNTHROID, LEVOTHROID) 88 MCG tablet Take 88 mcg by mouth daily before breakfast.  . magnesium hydroxide (MILK OF MAGNESIA) 400 MG/5ML suspension Take 30 mLs by mouth every 4 (four) hours as needed for mild constipation. Constipation/ no BM for 2 days  . Magnesium Oxide 250 MG TABS Take 250 mg by mouth daily.  . pantoprazole (PROTONIX) 40 MG tablet Take 1 tablet (40 mg total) by mouth daily. Switch for any other PPI at similar dose and frequency  . Probiotic Product (RISA-BID PROBIOTIC) TABS Take 1 tablet by mouth 2 (two) times daily.   . silver sulfADIAZINE (SILVADENE) 1 % cream Apply topically daily. Cleanse right leg wound with NS, skin prep around wound, apply thin film of silvadene cream to wound bed, cover with 4x4 gauze dressing and ace wrap.  . traZODone (DESYREL) 50 MG tablet Take 50 mg at bedtime by mouth.  Loura Pardon Salicylate (ASPERCREME) 10 % LOTN Apply thin film topically to areas of pain 3 times a day as needed  . vitamin B-12 (CYANOCOBALAMIN) 500 MCG tablet Take 500 mcg by mouth daily.   No facility-administered encounter medications on file as of 12/13/2017.      SIGNIFICANT DIAGNOSTIC EXAMS   LABS REVIEWED: TODAY:   08-13-17: wbc 3.8; hgb 10.4; hgb 31.1; mcv 95.9; plt 157; glucose 110 bun 29; creat 1.53; k+ 5.6; na++ 139; ca 8.9; liver normal albumin 3.2 chol 112; ldl 39; trig 142; hdl 45 mag 1.3; tsh 6.302; uric acid: 9.0; vit D 37.3; vit B 12: 1919 08-14-17: glucose 105; bun 32;  creat 1.51; k+ 5.3; na ++ 140; ca 8.9;  09-24-17 tsh 0.747   Review of Systems  Constitutional: Negative for malaise/fatigue.  Respiratory: Negative for cough and shortness of breath.   Cardiovascular: Negative for chest pain, palpitations and leg swelling.  Gastrointestinal: Negative for abdominal pain, constipation and heartburn.  Musculoskeletal: Negative for back pain, joint pain and myalgias.  Skin:  Negative.   Neurological: Negative for dizziness.  Psychiatric/Behavioral: The patient is not nervous/anxious.     Physical Exam  Constitutional: She appears well-developed and well-nourished. No distress.  Neck: No thyromegaly present.  Cardiovascular: Normal rate, regular rhythm, normal heart sounds and intact distal pulses.  Pulmonary/Chest: Effort normal and breath sounds normal. No respiratory distress.  Abdominal: Soft. Bowel sounds are normal. She exhibits no distension. There is no tenderness.  Musculoskeletal: Normal range of motion. She exhibits no edema.  Lymphadenopathy:    She has no cervical adenopathy.  Neurological: She is alert.  Skin: Skin is warm and dry. She is not diaphoretic.  Dressing intact to right lower leg   Psychiatric: She has a normal mood and affect.      ASSESSMENT/ PLAN:  TODAY:   1. Peripheral vascular disease of lower extremity with ulceration: is stable; will continue current treatment to lower extremity. Will continue neurontin 100 mg three times daily as needed for pain will continue tylenol 650 mg twice daily   2. Atherosclerotic heart disease of native coronary artery without angina pectoris: is stable will not make changes will monitor   3. Gastroesophageal reflux disease without esophagitis: is stable will continue protonix 40 mg daily   4. Hypothyroidism due to acquired atrophy of thyroid: is stable tsh 0.747: will continue synthroid 88 mcg daily   5. Type 2 diabetes mellitus with stage 3 chronic kidney disease without long term use  of insulin: is stable will not make changes will monitor  6. CKD stage 3 due to type 2 diabetes mellitus: stable bun 32 creat 1.51  7. Primary osteo arthritis of multiple sites: is stable will continue tylenol 650 mg twice daily   8. Chronic gout due to renal impairment unspecified site without tophus: is stable will monitor   9. Insomnia is stable will continue trazodone 50 mg nightly   MD is aware of resident's narcotic use and is in agreement with current plan of care. We will attempt to wean resident as apropriate   Ok Edwards NP [facility]  Contact [phone] Monday through Friday 8am- 5pm  After hours call [phone]

## 2017-12-22 DIAGNOSIS — G4700 Insomnia, unspecified: Secondary | ICD-10-CM | POA: Insufficient documentation

## 2017-12-26 ENCOUNTER — Non-Acute Institutional Stay (SKILLED_NURSING_FACILITY): Payer: Medicare Other | Admitting: Adult Health

## 2017-12-26 ENCOUNTER — Encounter
Admission: RE | Admit: 2017-12-26 | Discharge: 2017-12-26 | Disposition: A | Discharge status: Home / Self Care | Payer: Medicare Other | Source: Ambulatory Visit | Attending: Internal Medicine | Admitting: Internal Medicine

## 2017-12-26 ENCOUNTER — Encounter: Payer: Self-pay | Admitting: Adult Health

## 2017-12-26 DIAGNOSIS — E1122 Type 2 diabetes mellitus with diabetic chronic kidney disease: Secondary | ICD-10-CM | POA: Diagnosis not present

## 2017-12-26 DIAGNOSIS — N183 Chronic kidney disease, stage 3 (moderate): Secondary | ICD-10-CM | POA: Diagnosis not present

## 2017-12-26 DIAGNOSIS — L97519 Non-pressure chronic ulcer of other part of right foot with unspecified severity: Secondary | ICD-10-CM

## 2017-12-26 DIAGNOSIS — I10 Essential (primary) hypertension: Secondary | ICD-10-CM

## 2017-12-26 NOTE — Progress Notes (Signed)
[facility]  [facility] Room Number: 960A Place of Service:  SNF ([phone]) Provider:  Durenda Age, NP  Patient Care Team: Kirk Ruths, MD as PCP - General (Internal Medicine)  Extended Emergency Contact Information Primary Emergency Contact: Alger of [place] Phone: [phone] Relation: Niece Secondary Emergency Contact: Charolotte Eke [place] of [place] Phone: [phone] Relation: Nephew  Code Status:  DNR  Goals of care: Advanced Directive information Advanced Directives 12/26/2017  Does Patient Have a Medical Advance Directive? Yes  Type of Advance Directive Out of facility DNR (pink MOST or yellow form)  Does patient want to make changes to medical advance directive? No - Patient declined  Copy of [place] in Chart? -  Pre-existing out of facility DNR order (yellow form or pink MOST form) Yellow form placed in chart (order not valid for inpatient use)     Chief Complaint  Patient presents with  . Acute Visit    Non-healing wound on right lower extremity     HPI:  Pt is a 84 y.o. female seen today for an acute visit due to a non-healing wound on her right lower extremity.  She is a long-term care resident of [place].  She has a PMH of atherosclerotic heart disease, carotid stenosis, essential HTN, diabetes mellitus type 2, hypothyroidism, stroke, and osteoarthritis. She was seen in her room today. Right ankle and 2nd right toe has chronic wound. ABI done on 12/25/17 showed moderate arterial peripheral vascular disease manifested by biphasic and monophasic waveforms and diminished velocities. No areas of complete stenosis could be identified.Right foot has non-palpable pulse.   Past Medical History:  Diagnosis Date  . Acute gouty arthritis 11/21/2016  . Aortic atherosclerosis ([place]) 09/04/2016  . Carotid artery disease ([place]) 11/21/2016  . Carotid stenosis 12/03/2015   Advanced heavily calcified atherosclerotic disease at both carotid bifurcations. Stenoses measured at 70% affecting the right proximal ICA and 80% affecting the left proximal ICA.  Marland Kitchen Coronary atherosclerosis 09/04/2016  . Diverticulitis   . DM (diabetes mellitus), type 2 ([place]) 09/04/2016  . Gout   . HTN, goal below 140/90 11/21/2016  . Hyperlipidemia 06/20/2016  . Hypothyroidism, unspecified 11/21/2016  . Primary osteoarthritis 11/21/2016   involving multiple joints  . Stroke [facility]    Past Surgical History:  Procedure Laterality Date  . CATARACT EXTRACTION    . Finger crystal removal     right 3rd  . toe crystal removal     right 1st toe    No Known Allergies  Outpatient Encounter Medications as of 12/26/2017  Medication Sig  . acetaminophen (MAPAP ARTHRITIS PAIN) 650 MG CR tablet Take 650 mg 2 (two) times daily by mouth.   . Amino Acids-Protein Hydrolys (FEEDING SUPPLEMENT, PRO-STAT SUGAR FREE 64,) LIQD Take 30 mLs by mouth daily. To Promote Wound Healing. Wound on right lower leg  . camphor-menthol (SARNA) lotion Apply liberal amount to leg around the wound/dressing for itching BID and PRN. OK to use other places of itching. DO Not use on the wound. Okay to leave on bedside  . Cholecalciferol (VITAMIN D3) 2000 units capsule Take 2,000 Units by mouth daily.  . collagenase (SANTYL) ointment Apply topically daily. Cleanse right leg wound with ns, skin prep, around wound, apply santyl 1/4 in. thick to wound base, cover with ns moistened gauze, cover with allevyn dressing  . ENSURE (ENSURE) Take 237 mLs by mouth daily.  Marland Kitchen gabapentin (NEURONTIN) 100 MG capsule Take  100 mg by mouth 3 (three) times daily as needed.  . Hydroactive Dressings (DUODERM HYDROACTIVE EX) Apply topically. Cut a DuoDerm into 4 pieces . Use 1 at a time. Change Q 5 days or sooner if needed. Using this method for padding bone prominence.  Marland Kitchen levothyroxine (SYNTHROID, LEVOTHROID) 88 MCG tablet Take 88 mcg by mouth daily  before breakfast.  . magnesium hydroxide (MILK OF MAGNESIA) 400 MG/5ML suspension Take 30 mLs by mouth every 4 (four) hours as needed for mild constipation. Constipation/ no BM for 2 days  . Magnesium Oxide 250 MG TABS Take 250 mg by mouth daily.  . pantoprazole (PROTONIX) 40 MG tablet Take 1 tablet (40 mg total) by mouth daily. Switch for any other PPI at similar dose and frequency  . Probiotic Product (RISA-BID PROBIOTIC) TABS Take 1 tablet by mouth 2 (two) times daily.   . traZODone (DESYREL) 50 MG tablet Take 50 mg at bedtime by mouth.  Loura Pardon Salicylate (ASPERCREME) 10 % LOTN Apply thin film topically to areas of pain 3 times a day as needed  . UNABLE TO FIND Diet: Cardiac diet, Low NA  . vitamin B-12 (CYANOCOBALAMIN) 500 MCG tablet Take 500 mcg by mouth daily.  . [DISCONTINUED] silver sulfADIAZINE (SILVADENE) 1 % cream Apply topically daily. Cleanse right leg wound with NS, skin prep around wound, apply thin film of silvadene cream to wound bed, cover with 4x4 gauze dressing and ace wrap.   No facility-administered encounter medications on file as of 12/26/2017.     Review of Systems  GENERAL: No change in appetite, no fatigue, no weight changes, no fever, chills or weakness MOUTH and THROAT: Denies oral discomfort, gingival pain or bleeding, pain from teeth or hoarseness   RESPIRATORY: no cough, SOB, DOE, wheezing, hemoptysis CARDIAC: No chest pain, edema or palpitations GI: No abdominal pain, diarrhea, constipation, heart burn, nausea or vomiting GU: Denies dysuria, frequency, hematuria, incontinence, or discharge PSYCHIATRIC: Denies feelings of depression or anxiety. No report of hallucinations, insomnia, paranoia, or agitation   Immunization History  Administered Date(s) Administered  . Influenza,inj,Quad PF,6+ Mos 01/28/2015  . Influenza-Unspecified 02/14/2016  . PPD Test 12/28/2016   Pertinent  Health Maintenance Due  Topic Date Due  . INFLUENZA VACCINE  12/26/2017  .  FOOT EXAM  01/13/2018 (Originally 03/27/1929)  . HEMOGLOBIN A1C  01/13/2018 (Originally 03/06/2017)  . OPHTHALMOLOGY EXAM  01/13/2018 (Originally 03/27/1929)  . URINE MICROALBUMIN  01/13/2018 (Originally 03/27/1929)  . PNA vac Low Risk Adult (1 of 2 - PCV13) 01/13/2018 (Originally 03/26/1986)  . DEXA SCAN  05/29/2023 (Originally [birth year]/26/1986)   Fall Risk  09/04/2016  Falls in the past year? No    Vitals:   12/26/17 1426  BP: 130/78  Pulse: 79  Resp: 16  Temp: (!) 97.3 F (36.3 C)  TempSrc: Oral  SpO2: 99%  Weight: 133 lb 8 oz (60.6 kg)  Height: 5\' 6"  (1.676 m)   Body mass index is 21.55 kg/m.  Physical Exam  GENERAL APPEARANCE:  In no acute distress. Normal body habitus SKIN:  Chronic wound on right ankle and right 2nd toe MOUTH and THROAT: Lips are without lesions. Oral mucosa is moist and without lesions. Tongue is normal in shape, size, and color and without lesions RESPIRATORY: Breathing is even & unlabored, BS CTAB CARDIAC: RRR, no murmur,no extra heart sounds, no edema GI: Abdomen soft, normal BS, no masses, no tenderness EXTREMITIES:  Able to move X 4 extremities NEUROLOGICAL: There is no tremor. Speech is clear  PSYCHIATRIC: Alert to self, disoriented to time and place. Affect and behavior are appropriate   Labs reviewed: Recent Labs    02/14/17 0435 08/13/17 0700 08/14/17 0400  NA 140 139 140  K 5.1 5.6* 5.3*  CL 112* 110 109  CO2 23 22 22   GLUCOSE 120* 110* 105*  BUN 39* 29* 32*  CREATININE 1.13* 1.53* 1.51*  CALCIUM 9.3 8.9 8.9  MG 1.9 1.3*  --    Recent Labs    02/14/17 0435 08/13/17 0700  AST 34 32  ALT 23 22  ALKPHOS 144* 116  BILITOT 0.6 0.8  PROT 6.6 6.1*  ALBUMIN 3.3* 3.2*   Recent Labs    02/14/17 0435 08/13/17 0700  WBC 4.5 3.8  NEUTROABS 2.1 2.0  HGB 11.3* 10.4*  HCT 33.5* 31.1*  MCV 90.4 95.9  PLT 173 157   Lab Results  Component Value Date   TSH 0.747 09/24/2017   Lab Results  Component Value Date   HGBA1C 6.7 (H)  09/04/2016   Lab Results  Component Value Date   CHOL 112 08/13/2017   HDL 45 08/13/2017   LDLCALC 39 08/13/2017   LDLDIRECT 58.0 09/04/2016   TRIG 142 08/13/2017   CHOLHDL 2.5 08/13/2017    Assessment/Plan  1. Ulcer of foot, chronic, right, with unspecified severity ([place]) - continue wound treatment daily, Vascular consult for ABI impression of moderate PVD   2. Type 2 diabetes mellitus with stage 3 chronic kidney disease, without long-term current use of insulin ([place]) - diet-controlled   3. Essential hypertension - controlled, not on any anti-hypertensive medication     Family/ staff Communication: Discussed plan of care with resident and charge nurse.  Labs/tests ordered:  None  Goals of care:   Long-term care.   Durenda Age, NP [facility] [phone] (Monday-Friday 8:00 a.m. - 5:00 p.m.) [phone] (after hours)

## 2017-12-27 ENCOUNTER — Other Ambulatory Visit (INDEPENDENT_AMBULATORY_CARE_PROVIDER_SITE_OTHER): Payer: Self-pay | Admitting: Nurse Practitioner

## 2017-12-27 ENCOUNTER — Encounter (INDEPENDENT_AMBULATORY_CARE_PROVIDER_SITE_OTHER): Payer: Self-pay | Admitting: Nurse Practitioner

## 2017-12-27 ENCOUNTER — Ambulatory Visit (INDEPENDENT_AMBULATORY_CARE_PROVIDER_SITE_OTHER): Payer: Medicare Other | Admitting: Nurse Practitioner

## 2017-12-27 ENCOUNTER — Ambulatory Visit (INDEPENDENT_AMBULATORY_CARE_PROVIDER_SITE_OTHER): Payer: Medicare Other

## 2017-12-27 VITALS — BP 161/83 | HR 85 | Resp 16 | Ht 66.0 in | Wt 133.0 lb

## 2017-12-27 DIAGNOSIS — I739 Peripheral vascular disease, unspecified: Secondary | ICD-10-CM | POA: Diagnosis not present

## 2017-12-27 DIAGNOSIS — L97819 Non-pressure chronic ulcer of other part of right lower leg with unspecified severity: Secondary | ICD-10-CM | POA: Diagnosis not present

## 2017-12-27 DIAGNOSIS — I10 Essential (primary) hypertension: Secondary | ICD-10-CM | POA: Diagnosis not present

## 2017-12-27 DIAGNOSIS — L97919 Non-pressure chronic ulcer of unspecified part of right lower leg with unspecified severity: Secondary | ICD-10-CM

## 2017-12-27 DIAGNOSIS — M150 Primary generalized (osteo)arthritis: Secondary | ICD-10-CM | POA: Diagnosis not present

## 2017-12-27 DIAGNOSIS — Z86718 Personal history of other venous thrombosis and embolism: Secondary | ICD-10-CM | POA: Diagnosis not present

## 2017-12-27 DIAGNOSIS — L97909 Non-pressure chronic ulcer of unspecified part of unspecified lower leg with unspecified severity: Secondary | ICD-10-CM | POA: Diagnosis not present

## 2017-12-27 DIAGNOSIS — M159 Polyosteoarthritis, unspecified: Secondary | ICD-10-CM

## 2017-12-27 DIAGNOSIS — I6523 Occlusion and stenosis of bilateral carotid arteries: Secondary | ICD-10-CM

## 2017-12-27 NOTE — Patient Instructions (Signed)
Peripheral Vascular Disease Peripheral vascular disease (PVD) is a disease of the blood vessels that are not part of your heart and brain. A simple term for PVD is poor circulation. In most cases, PVD narrows the blood vessels that carry blood from your heart to the rest of your body. This can result in a decreased supply of blood to your arms, legs, and internal organs, like your stomach or kidneys. However, it most often affects a person's lower legs and feet. There are two types of PVD.  Organic PVD. This is the more common type. It is caused by damage to the structure of blood vessels.  Functional PVD. This is caused by conditions that make blood vessels contract and tighten (spasm).  Without treatment, PVD tends to get worse over time. PVD can also lead to acute ischemic limb. This is when an arm or limb suddenly has trouble getting enough blood. This is a medical emergency. Follow these instructions at home:  Take medicines only as told by your doctor.  Do not use any tobacco products, including cigarettes, chewing tobacco, or electronic cigarettes. If you need help quitting, ask your doctor.  Lose weight if you are overweight, and maintain a healthy weight as told by your doctor.  Eat a diet that is low in fat and cholesterol. If you need help, ask your doctor.  Exercise regularly. Ask your doctor for some good activities for you.  Take good care of your feet. ? Wear comfortable shoes that fit well. ? Check your feet often for any cuts or sores. Contact a doctor if:  You have cramps in your legs while walking.  You have leg pain when you are at rest.  You have coldness in a leg or foot.  Your skin changes.  You are unable to get or have an erection (erectile dysfunction).  You have cuts or sores on your feet that are not healing. Get help right away if:  Your arm or leg turns cold and blue.  Your arms or legs become red, warm, swollen, painful, or numb.  You have  chest pain or trouble breathing.  You suddenly have weakness in your face, arm, or leg.  You become very confused or you cannot speak.  You suddenly have a very bad headache.  You suddenly cannot see. This information is not intended to replace advice given to you by your health care provider. Make sure you discuss any questions you have with your health care provider. Document Released: 08/08/2009 Document Revised: 10/20/2015 Document Reviewed: 10/22/2013 Elsevier Interactive Patient Education  2017 [employer].  Endovascular Therapy for Peripheral Arterial Disease Peripheral arterial disease (PAD) is a condition in which the arteries that supply blood to the arms and legs (limbs) are too narrow. The narrowing is due to plaque buildup (atherosclerosis). Plaque is made up of fat, cholesterol, calcium, or other substances that can build up inside blood vessels. PAD can cause pain and numbness due to lack of blood flow, particularly in the legs. Endovascular therapy is a procedure to widen a narrowed blood vessel and improve blood flow. Endovascular means the procedure is done inside your artery, using a long, thin tube (catheter). The catheter is inserted into an incision in your leg and moved up your artery until it reaches the narrow part. A balloon or a small metal tube (stent) may be used to help widen the narrow artery and keep it open. Your health care provider may recommend endovascular therapy if lifestyle changes and medicines  are not enough to improve your PAD. In some cases-such as when more than one artery is affected-you may need more than one procedure. Tell a health care provider about:  Any allergies you have.  All medicines you are taking, including vitamins, herbs, eye drops, creams, and over-the-counter medicines.  Any problems you or family members have had with anesthetic medicines.  Any blood disorders you have.  Any surgeries you have had.  Any medical conditions  you have.  Whether you are pregnant or may be pregnant. What are the risks? Generally, this is a safe procedure. However, problems may occur, including:  Infection.  Bleeding.  Allergic reactions to medicines, materials, or dyes.  Damage to other structures or organs.  Heart attack.  Stroke.  Blood clots.  Kidney problems.  Nerve damage.  The stent moving out of place, becoming blocked, or not working.  Loss of your affected arm or leg.  What happens before the procedure? Medicines  Ask your health care provider about: ? Changing or stopping your regular medicines. This is especially important if you are taking diabetes medicines or blood thinners. ? Taking medicines such as aspirin and ibuprofen. These medicines can thin your blood. Do not take these medicines unless your health care provider tells you to take them. ? Taking over-the-counter medicines, vitamins, herbs, and supplements.  You may be given antibiotic medicine to help prevent infection. General instructions  Do not use any products that contain nicotine or tobacco, such as cigarettes and e-cigarettes. If you need help quitting, ask your health care provider.  Follow instructions from your health care provider about eating or drinking restrictions.  You will have blood tests and a physical exam. You may have other tests, such as: ? Ankle-brachial index (ABI). This test compares blood pressure in your ankle and arm. This can indicate narrowing or blockage in your leg arteries. ? Doppler ultrasound. This test uses sound waves to check blood flow. ? CT scan. This test uses dye to check blood flow and blockages in your leg arteries. ? MRI. ? Electrocardiogram (ECG) to check the electrical patterns and rhythms of the heart.  You may be asked to shower with a germ-killing soap.  Ask your health care provider how your surgical site will be marked or identified.  Plan to have someone take you home from the  hospital. What happens during the procedure?  To lower your risk of infection: ? Your health care team will wash or sanitize their hands. ? Hair may be removed from the surgical area. ? Your skin will be washed with soap.  An IV will be inserted into one of your veins.  You will be given one or more of the following: ? A medicine to help you relax (sedative). ? A medicine to numb the area for the procedure (local anesthetic).  A puncture will be made in your upper thigh area, in the femoral artery or the iliac artery. Rarely, a puncture may be made in the ankle area. A small incision may be made instead of a puncture.  A small wire will be inserted into the artery and moved through the artery.  Dye will be injected into your artery, and X-rays will be used to help identify where the blockage is. The dye helps to make the blood flow visible on X-rays.  A catheter will be inserted into the same spot as the small wire. It will be moved up the artery to reach the blocked or narrow part.  A small, deflated balloon will be inserted over the wire and into the catheter. It will be moved up the artery to reach the blocked or narrow part.  The small balloon will be filled with air (inflated) to widen the narrow part of the artery.  The balloon will be deflated.  A stent may be placed in the widened part of the artery to keep the artery open.  The wire and catheter will be removed.  A small catheter (urinary catheter) may be placed to drain urine from your bladder. This catheter may be used temporarily to drain urine during or after the procedure.  Your puncture or incision may be closed with a stitch (suture) or skin glue.  Your puncture or incision may be covered with a bandage (dressing). The procedure may vary among health care providers and hospitals. What happens after the procedure?  Your blood pressure, heart rate, breathing rate, and blood oxygen level will be monitored until  the medicines you were given have worn off.  You will need to stay in bed as directed.  You may continue to have a catheter draining your urine.  You will be encouraged to drink fluids to wash (flush) the dye out of your body.  You will be given pain medicine as needed.  If you were given a sedative, do not drive for 24 hours or until your health care provider says that driving is safe for you. Summary  Endovascular therapy is a procedure to widen a narrowed blood vessel and improve blood flow.  This procedure may be recommended if lifestyle changes and medicines are not enough to improve your peripheral arterial disease (PAD).  After the procedure, you will need to stay in bed and you will be encouraged to drink fluids to wash (flush) dye out of your body. This information is not intended to replace advice given to you by your health care provider. Make sure you discuss any questions you have with your health care provider. Document Released: 09/06/2016 Document Revised: 09/06/2016 Document Reviewed: 09/06/2016 Elsevier Interactive Patient Education  [employer].

## 2017-12-27 NOTE — Progress Notes (Signed)
Subjective:    Patient ID: Ashley [name], female    DOB: Jun 27, 1918, 84 y.o.   MRN: [id number] Chief Complaint  Patient presents with  . Follow-up    right le ulcer    HPI  Ms. Rothbauer is a 84 year old female presenting from her assisted living facility, accompanied by her niece Kieth Brightly, who is also her healthcare power of attorney.  Ms. Nicki Reaper was referred to as after dealing with a wound that has gotten progressively worse over several months.  There is also been new ulcer formation on her right second toe.  The previous ulcer is located on her right lateral ankle.  The wound on the lateral ankle produces small amount of drainage it is approximately 2 inches long with an area of healthy wound bed, with fibrinous exudate in the middle.  It appears very well taking care of.  Per the niece it is just daily.  The the wound on the second toe is a very small 1 cm area located on top of the distal the patient denies any chest pains joint that is covered in eschar.  In addition to these ulcerations, the knees makes mention of pain that she is consistently having in her legs and toes.  The patient herself is not the best historian, stating that her legs hurt off and on sometimes when she walks down the hallway and sometimes they give her "fits when in bed".  The patient endorses walking at her assisted living facility, however the niece states that she walks very little.  There is also concerned that her nutrition has not been the best either.  A stat ABI was performed in the office.  The ABI revealed a measurement of 1.42 on the left, however the TBI for the left was 0.48 suggesting medial calcification.  The right ABI was 0.70 with a right TBI of 0.26.  There are monophasic flow in both the right anterior tibial and posterior tibial arteries, whereas on the left anterior tibial and posterior tibial the are biphasic flows.  Patient denies any nausea, fever, vomiting or diarrhea.  She has not experienced any  chest pain, shortness of breath, or palpitations.  She denies any recent TIA symptoms, amaurosis fugax, or dizziness.       Constitutional: [x] Weight loss  [] Fever  [] Chills Cardiac: [] Chest pain   [] Chest pressure   [] Palpitations   [] Shortness of breath when laying flat   [] Shortness of breath with exertion. Vascular:  [x] Pain in legs with walking   [] Pain in legs with standing  [x] History of DVT   [] Phlebitis   [] Swelling in legs   [] Varicose veins   [x] Non-healing ulcers Pulmonary:   [] Uses home oxygen   [] Productive cough   [] Hemoptysis   [] Wheeze  [] COPD   [] Asthma Neurologic:  [] Dizziness   [] Seizures   [] History of stroke   [] History of TIA  [] Aphasia   [] Vissual changes   [] Weakness or numbness in arm   [x] Weakness or numbness in leg Musculoskeletal:   [x] Joint swelling   [x] Joint pain   [] Low back pain Hematologic:  [] Easy bruising  [] Easy bleeding   [] Hypercoagulable state   [] Anemic Gastrointestinal:  [] Diarrhea   [] Vomiting  [] Gastroesophageal reflux/heartburn   [] Difficulty swallowing. Genitourinary:  [] Chronic kidney disease   [] Difficult urination  [] Frequent urination   [] Blood in urine Skin:  [] Rashes   [x] Ulcers  Psychological:  [] History of anxiety   []  History of major depression.     Objective:   Physical Exam  BP (!) 161/83 (BP Location: Right Arm)   Pulse 85   Resp 16   Ht 5\' 6"  (1.676 m)   Wt 133 lb (60.3 kg)   BMI 21.47 kg/m   Past Medical History:  Diagnosis Date  . Acute gouty arthritis 11/21/2016  . Aortic atherosclerosis ([place]) 09/04/2016  . Carotid artery disease ([place]) 11/21/2016  . Carotid stenosis 12/03/2015   Advanced heavily calcified atherosclerotic disease at both carotid bifurcations. Stenoses measured at 70% affecting the right proximal ICA and 80% affecting the left proximal ICA.  Marland Kitchen Coronary atherosclerosis 09/04/2016  . Diverticulitis   . DM (diabetes mellitus), type 2 ([place]) 09/04/2016  . Gout   . HTN, goal below 140/90 11/21/2016  .  Hyperlipidemia 06/20/2016  . Hypothyroidism, unspecified 11/21/2016  . Primary osteoarthritis 11/21/2016   involving multiple joints  . Stroke [facility])      Gen: WD/WN, NAD Head: [place]/AT, No temporalis wasting.  Ear/Nose/Throat: Hearing grossly intact, nares w/o erythema or drainage, poor dentition Eyes: PER, EOMI, sclera nonicteric.  Neck: Supple, no masses.  No bruit or JVD.  Pulmonary:  Good air movement, clear to auscultation bilaterally, no use of accessory muscles.  Cardiac: RRR, normal S1, S2, no Murmurs. Vascular:  Vessel Right Left  Radial 2+ 2+  Brachial    Femoral    Popliteal    PT Non-palpable Non-palpable  DP Non-papable Non-palpable  Carotid    Gastrointestinal: soft, non-distended. No guarding/no peritoneal signs.  Musculoskeletal: M/S 5/5 throughout. evidence of extensive arthritis Neurologic: CN 2-12 intact. Pain and light touch intact in extremities.  Symmetrical.  Speech is fluent. Motor exam as listed above. Psychiatric: Judgment intact, Mood & affect appropriate for pt's clinical situation. Dermatologic: Ulcer above right lateral ankle and on 2nd right toe Lymph : No Cervical lymphadenopathy, no lichenificatio or skin changes of chronic lymphedema.   Social History   Socioeconomic History  . Marital status: Widowed    Spouse name: Not on file  . Number of children: 0  . Years of education: 10  . Highest education level: 10th grade  Occupational History  . Not on file  Social Needs  . Financial resource strain: Not on file  . Food insecurity:    Worry: Not on file    Inability: Not on file  . Transportation needs:    Medical: Not on file    Non-medical: Not on file  Tobacco Use  . Smoking status: Never Smoker  . Smokeless tobacco: Never Used  Substance and Sexual Activity  . Alcohol use: No  . Drug use: No  . Sexual activity: Not Currently  Lifestyle  . Physical activity:    Days per week: Not on file    Minutes per session: Not on file  .  Stress: Not on file  Relationships  . Social connections:    Talks on phone: Not on file    Gets together: Not on file    Attends religious service: Not on file    Active member of club or organization: Not on file    Attends meetings of clubs or organizations: Not on file    Relationship status: Not on file  . Intimate partner violence:    Fear of current or ex partner: Not on file    Emotionally abused: Not on file    Physically abused: Not on file    Forced sexual activity: Not on file  Other Topics Concern  . Not on file  Social History Narrative   Admitted  to [facility] 11/16/2016   Widowed   No children   Does not drink alcohol   Never smoker   DNR    Past Surgical History:  Procedure Laterality Date  . CATARACT EXTRACTION    . Finger crystal removal     right 3rd  . toe crystal removal     right 1st toe    Family History  Problem Relation Age of Onset  . Hypertension Sister   . Arthritis Sister   . Heart disease Sister   . Alcohol abuse Brother   . Stroke Brother   . Hypertension Brother   . Alcohol abuse Brother   . Stroke Brother   . Hypertension Brother   . Hypertension Brother   . Hypertension Sister     No Known Allergies     Assessment & Plan:   1. Peripheral vascular disease of lower extremity with ulceration (HCC)   Extensive review of the patient's records from previous providers as well as previous medical history gave strong suspicion to a arterial cause for her ulceration.  A stat ABI was done and her ABI on her right was 0.70 whereas the ABI on her left was 1.58 however the toe brachial index on her right was 0.26 and the toe brachial index on the left was 0.48.  Reviewing the waveforms of her first digit on her right foot revealed nearly flat waveforms.  Given this information, combined with the patient's descriptions of possible rest pain, it was felt that the best option for the patient was to undergo angioplasty.  I spent 40  minutes educating the patient and her niece about the pathophysiology of atherosclerosis why an angioplasty was the best option to restore blood flow to the right lower extremity which would allow for wound healing, as well as possibly give some relief to the pain Ms. Oviatt is describing.  I also reviewed possible outcomes of doing nothing.  I explored the alternative options with the family, and the family agreed that this procedure would be in Ms. Pearman's best interest.    I explained the risk, benefits, alternative therapies,and procedure were explained to the patient and her family.  The family understands that there may be some pain and swelling following the procedure due to reperfusion syndrome.  The family also understands that this is generally an outpatient procedure done under conscious sedation and Ms. Vollrath should be able to return home that same day.  I answered all questions, as well as they have reading material to the family.  I advised him to contact our office should they have any further questions about the procedure.  I notified him that they would be contacted shortly to arrange a time and date for the procedure.  This was all agreed to by the family.  We will follow-up in office with Ms. Krolak one the procedure has been completed.  2. Essential hypertension Continue antihypertensive medications as already ordered, these medications have been reviewed and there are no changes at this time.   3. Primary osteoarthritis involving multiple joints Continue NSAID medications as already ordered, these medications have been reviewed and there are no changes at this time.   4. History of DVT (deep vein thrombosis)   Ms. Benn was seen in our office in 2017 for a left lower extremity DVT.  This DVT has since resolved.  No evidence of lasting injury.   Current Outpatient Medications on File Prior to Visit  Medication Sig Dispense Refill  .  acetaminophen (MAPAP ARTHRITIS PAIN) 650 MG CR  tablet Take 650 mg 2 (two) times daily by mouth.     . Amino Acids-Protein Hydrolys (FEEDING SUPPLEMENT, PRO-STAT SUGAR FREE 64,) LIQD Take 30 mLs by mouth daily. To Promote Wound Healing. Wound on right lower leg    . camphor-menthol (SARNA) lotion Apply liberal amount to leg around the wound/dressing for itching BID and PRN. OK to use other places of itching. DO Not use on the wound. Okay to leave on bedside    . Cholecalciferol (VITAMIN D3) 2000 units capsule Take 2,000 Units by mouth daily.    . collagenase (SANTYL) ointment Apply topically daily. Cleanse right leg wound with ns, skin prep, around wound, apply santyl 1/4 in. thick to wound base, cover with ns moistened gauze, cover with allevyn dressing    . ENSURE (ENSURE) Take 237 mLs by mouth daily.    Marland Kitchen gabapentin (NEURONTIN) 100 MG capsule Take 100 mg by mouth 3 (three) times daily as needed.    . Hydroactive Dressings (DUODERM HYDROACTIVE EX) Apply topically. Cut a DuoDerm into 4 pieces . Use 1 at a time. Change Q 5 days or sooner if needed. Using this method for padding bone prominence.    Marland Kitchen levothyroxine (SYNTHROID, LEVOTHROID) 88 MCG tablet Take 88 mcg by mouth daily before breakfast.    . magnesium hydroxide (MILK OF MAGNESIA) 400 MG/5ML suspension Take 30 mLs by mouth every 4 (four) hours as needed for mild constipation. Constipation/ no BM for 2 days    . Magnesium Oxide 250 MG TABS Take 250 mg by mouth daily.    . pantoprazole (PROTONIX) 40 MG tablet Take 1 tablet (40 mg total) by mouth daily. Switch for any other PPI at similar dose and frequency 30 tablet 3  . Probiotic Product (RISA-BID PROBIOTIC) TABS Take 1 tablet by mouth 2 (two) times daily.     . traZODone (DESYREL) 50 MG tablet Take 50 mg at bedtime by mouth.    Loura Pardon Salicylate (ASPERCREME) 10 % LOTN Apply thin film topically to areas of pain 3 times a day as needed    . UNABLE TO FIND Diet: Cardiac diet, Low NA    . vitamin B-12 (CYANOCOBALAMIN) 500 MCG tablet Take  500 mcg by mouth daily.     No current facility-administered medications on file prior to visit.     There are no Patient Instructions on file for this visit. No follow-ups on file.   Kris Hartmann, NP

## 2017-12-30 ENCOUNTER — Encounter (INDEPENDENT_AMBULATORY_CARE_PROVIDER_SITE_OTHER): Payer: Self-pay

## 2017-12-30 ENCOUNTER — Telehealth (INDEPENDENT_AMBULATORY_CARE_PROVIDER_SITE_OTHER): Payer: Self-pay

## 2017-12-30 NOTE — Telephone Encounter (Signed)
I attempted to contact the patient's niece to inform her that the patient is scheduled for her angiogram on 01/06/18 with a 6:45 arrival and a pre-op on 01/03/18. Patient's niece called back.

## 2017-12-31 ENCOUNTER — Other Ambulatory Visit (INDEPENDENT_AMBULATORY_CARE_PROVIDER_SITE_OTHER): Payer: Self-pay | Admitting: Nurse Practitioner

## 2018-01-01 ENCOUNTER — Other Ambulatory Visit
Admission: RE | Admit: 2018-01-01 | Discharge: 2018-01-01 | Disposition: A | Discharge status: Home / Self Care | Payer: Medicare Other | Source: Ambulatory Visit | Attending: Internal Medicine | Admitting: Internal Medicine

## 2018-01-01 DIAGNOSIS — I70238 Atherosclerosis of native arteries of right leg with ulceration of other part of lower right leg: Secondary | ICD-10-CM | POA: Insufficient documentation

## 2018-01-01 LAB — CBC WITH DIFFERENTIAL/PLATELET
BASOS ABS: 0.1 10*3/uL (ref 0–0.1)
Basophils Relative: 1 %
Eosinophils Absolute: 0.2 10*3/uL (ref 0–0.7)
Eosinophils Relative: 3 %
HEMATOCRIT: 32.2 % — AB (ref 35.0–47.0)
Hemoglobin: 10.8 g/dL — ABNORMAL LOW (ref 12.0–16.0)
LYMPHS ABS: 0.9 10*3/uL — AB (ref 1.0–3.6)
LYMPHS PCT: 13 %
MCH: 31.8 pg (ref 26.0–34.0)
MCHC: 33.6 g/dL (ref 32.0–36.0)
MCV: 94.8 fL (ref 80.0–100.0)
Monocytes Absolute: 0.9 10*3/uL (ref 0.2–0.9)
Monocytes Relative: 13 %
NEUTROS ABS: 5.2 10*3/uL (ref 1.4–6.5)
Neutrophils Relative %: 70 %
Platelets: 203 10*3/uL (ref 150–440)
RBC: 3.39 MIL/uL — AB (ref 3.80–5.20)
RDW: 13.9 % (ref 11.5–14.5)
WBC: 7.4 10*3/uL (ref 3.6–11.0)

## 2018-01-01 LAB — TSH: TSH: 2.753 u[IU]/mL (ref 0.350–4.500)

## 2018-01-01 LAB — BRAIN NATRIURETIC PEPTIDE: B NATRIURETIC PEPTIDE 5: 79 pg/mL (ref 0.0–100.0)

## 2018-01-03 ENCOUNTER — Other Ambulatory Visit: Payer: Self-pay

## 2018-01-03 ENCOUNTER — Encounter
Admission: RE | Admit: 2018-01-03 | Discharge: 2018-01-03 | Disposition: A | Discharge status: Home / Self Care | Payer: Medicare Other | Source: Ambulatory Visit | Attending: Vascular Surgery | Admitting: Vascular Surgery

## 2018-01-03 DIAGNOSIS — I10 Essential (primary) hypertension: Secondary | ICD-10-CM | POA: Diagnosis not present

## 2018-01-03 DIAGNOSIS — E785 Hyperlipidemia, unspecified: Secondary | ICD-10-CM | POA: Diagnosis not present

## 2018-01-03 DIAGNOSIS — E11621 Type 2 diabetes mellitus with foot ulcer: Secondary | ICD-10-CM | POA: Diagnosis not present

## 2018-01-03 DIAGNOSIS — L97511 Non-pressure chronic ulcer of other part of right foot limited to breakdown of skin: Secondary | ICD-10-CM | POA: Diagnosis not present

## 2018-01-03 DIAGNOSIS — I7025 Atherosclerosis of native arteries of other extremities with ulceration: Secondary | ICD-10-CM | POA: Diagnosis not present

## 2018-01-03 DIAGNOSIS — E039 Hypothyroidism, unspecified: Secondary | ICD-10-CM | POA: Diagnosis not present

## 2018-01-03 HISTORY — DX: Chronic kidney disease, unspecified: N18.9

## 2018-01-03 HISTORY — DX: Gastro-esophageal reflux disease without esophagitis: K21.9

## 2018-01-03 LAB — BUN: BUN: 43 mg/dL — ABNORMAL HIGH (ref 8–23)

## 2018-01-03 LAB — CREATININE, SERUM
Creatinine, Ser: 1.19 mg/dL — ABNORMAL HIGH (ref 0.44–1.00)
GFR, EST AFRICAN AMERICAN: 43 mL/min — AB (ref >60)
GFR, EST NON AFRICAN AMERICAN: 37 mL/min — AB (ref >60)

## 2018-01-03 NOTE — Patient Instructions (Signed)
FOLLOW INSTRUCTIONS GIVEN TO YOU BY [facility].  Your procedure is scheduled with Dr. Lucky Cowboy                            On:  Monday, January 06, 2018    Go to '[facility]' on the first floor of the [facility].  Do not eat or drink 8 hours prior to your procedure.    Please call Dr Lucky Cowboy and Dr Nino Parsley office with any questions or concerns: [phone].  You will need to have someone drive you home and stay with you the night of the procedure.

## 2018-01-05 MED ORDER — DEXTROSE 5 % IV SOLN
2.0000 g | Freq: Once | INTRAVENOUS | Status: DC
  Filled 2018-01-05: qty 20

## 2018-01-06 ENCOUNTER — Ambulatory Visit
Admission: RE | Admit: 2018-01-06 | Discharge: 2018-01-06 | Disposition: A | Discharge status: Skilled Nursing Facility | Payer: Medicare Other | Source: Ambulatory Visit | Attending: Vascular Surgery | Admitting: Vascular Surgery

## 2018-01-06 ENCOUNTER — Encounter
Admission: RE | Disposition: A | Discharge status: Skilled Nursing Facility | Payer: Self-pay | Source: Ambulatory Visit | Attending: Vascular Surgery

## 2018-01-06 DIAGNOSIS — I7025 Atherosclerosis of native arteries of other extremities with ulceration: Secondary | ICD-10-CM | POA: Insufficient documentation

## 2018-01-06 DIAGNOSIS — E11621 Type 2 diabetes mellitus with foot ulcer: Secondary | ICD-10-CM | POA: Insufficient documentation

## 2018-01-06 DIAGNOSIS — I70238 Atherosclerosis of native arteries of right leg with ulceration of other part of lower right leg: Secondary | ICD-10-CM | POA: Diagnosis not present

## 2018-01-06 DIAGNOSIS — L97511 Non-pressure chronic ulcer of other part of right foot limited to breakdown of skin: Secondary | ICD-10-CM | POA: Insufficient documentation

## 2018-01-06 DIAGNOSIS — I70299 Other atherosclerosis of native arteries of extremities, unspecified extremity: Secondary | ICD-10-CM

## 2018-01-06 DIAGNOSIS — Z9889 Other specified postprocedural states: Secondary | ICD-10-CM | POA: Insufficient documentation

## 2018-01-06 DIAGNOSIS — E785 Hyperlipidemia, unspecified: Secondary | ICD-10-CM | POA: Insufficient documentation

## 2018-01-06 DIAGNOSIS — E039 Hypothyroidism, unspecified: Secondary | ICD-10-CM | POA: Insufficient documentation

## 2018-01-06 DIAGNOSIS — I6523 Occlusion and stenosis of bilateral carotid arteries: Secondary | ICD-10-CM | POA: Insufficient documentation

## 2018-01-06 DIAGNOSIS — Z8249 Family history of ischemic heart disease and other diseases of the circulatory system: Secondary | ICD-10-CM | POA: Insufficient documentation

## 2018-01-06 DIAGNOSIS — Z823 Family history of stroke: Secondary | ICD-10-CM | POA: Insufficient documentation

## 2018-01-06 DIAGNOSIS — I2510 Atherosclerotic heart disease of native coronary artery without angina pectoris: Secondary | ICD-10-CM | POA: Insufficient documentation

## 2018-01-06 DIAGNOSIS — Z66 Do not resuscitate: Secondary | ICD-10-CM | POA: Insufficient documentation

## 2018-01-06 DIAGNOSIS — Z79899 Other long term (current) drug therapy: Secondary | ICD-10-CM | POA: Insufficient documentation

## 2018-01-06 DIAGNOSIS — M150 Primary generalized (osteo)arthritis: Secondary | ICD-10-CM | POA: Insufficient documentation

## 2018-01-06 DIAGNOSIS — Z8673 Personal history of transient ischemic attack (TIA), and cerebral infarction without residual deficits: Secondary | ICD-10-CM | POA: Insufficient documentation

## 2018-01-06 DIAGNOSIS — Z8261 Family history of arthritis: Secondary | ICD-10-CM | POA: Insufficient documentation

## 2018-01-06 DIAGNOSIS — L97909 Non-pressure chronic ulcer of unspecified part of unspecified lower leg with unspecified severity: Secondary | ICD-10-CM

## 2018-01-06 DIAGNOSIS — I10 Essential (primary) hypertension: Secondary | ICD-10-CM | POA: Diagnosis not present

## 2018-01-06 DIAGNOSIS — Z9849 Cataract extraction status, unspecified eye: Secondary | ICD-10-CM | POA: Insufficient documentation

## 2018-01-06 DIAGNOSIS — Z86718 Personal history of other venous thrombosis and embolism: Secondary | ICD-10-CM | POA: Insufficient documentation

## 2018-01-06 DIAGNOSIS — Z79890 Hormone replacement therapy (postmenopausal): Secondary | ICD-10-CM | POA: Insufficient documentation

## 2018-01-06 HISTORY — PX: LOWER EXTREMITY ANGIOGRAPHY: CATH118251

## 2018-01-06 LAB — GLUCOSE, CAPILLARY: GLUCOSE-CAPILLARY: 175 mg/dL — AB (ref 70–99)

## 2018-01-06 SURGERY — LOWER EXTREMITY ANGIOGRAPHY
Anesthesia: Moderate Sedation | Laterality: Right

## 2018-01-06 MED ORDER — IOPAMIDOL (ISOVUE-300) INJECTION 61%
INTRAVENOUS | Status: DC | PRN
  Administered 2018-01-06: 80 mL via INTRA_ARTERIAL

## 2018-01-06 MED ORDER — FENTANYL CITRATE (PF) 100 MCG/2ML IJ SOLN
INTRAMUSCULAR | Status: DC | PRN
  Administered 2018-01-06: 12.5 ug via INTRAVENOUS
  Administered 2018-01-06: 25 ug via INTRAVENOUS
  Administered 2018-01-06: 50 ug via INTRAVENOUS

## 2018-01-06 MED ORDER — ASPIRIN EC 81 MG PO TBEC: 81.0000 mg | DELAYED_RELEASE_TABLET | Freq: Every day | ORAL | 2 refills | Status: AC

## 2018-01-06 MED ORDER — ATORVASTATIN CALCIUM 10 MG PO TABS: 10.0000 mg | ORAL_TABLET | Freq: Every day | ORAL | Status: DC

## 2018-01-06 MED ORDER — HEPARIN SODIUM (PORCINE) 1000 UNIT/ML IJ SOLN
INTRAMUSCULAR | Status: AC
  Filled 2018-01-06: qty 1

## 2018-01-06 MED ORDER — SODIUM CHLORIDE 0.9% FLUSH: 3.0000 mL | Freq: Two times a day (BID) | INTRAVENOUS | Status: DC

## 2018-01-06 MED ORDER — HYDRALAZINE HCL 20 MG/ML IJ SOLN: 5.0000 mg | INTRAMUSCULAR | Status: DC | PRN

## 2018-01-06 MED ORDER — MIDAZOLAM HCL 5 MG/5ML IJ SOLN
INTRAMUSCULAR | Status: AC
  Filled 2018-01-06: qty 5

## 2018-01-06 MED ORDER — ONDANSETRON HCL 4 MG/2ML IJ SOLN: 4.0000 mg | Freq: Four times a day (QID) | INTRAMUSCULAR | Status: DC | PRN

## 2018-01-06 MED ORDER — SODIUM CHLORIDE 0.9 % IV SOLN: INTRAVENOUS | Status: DC

## 2018-01-06 MED ORDER — METHYLPREDNISOLONE SODIUM SUCC 125 MG IJ SOLR: 125.0000 mg | Freq: Once | INTRAMUSCULAR | Status: DC

## 2018-01-06 MED ORDER — HEPARIN SODIUM (PORCINE) 1000 UNIT/ML IJ SOLN
INTRAMUSCULAR | Status: DC | PRN
  Administered 2018-01-06: 4000 [IU] via INTRAVENOUS

## 2018-01-06 MED ORDER — CEFAZOLIN SODIUM-DEXTROSE 2-4 GM/100ML-% IV SOLN
INTRAVENOUS | Status: AC
  Filled 2018-01-06: qty 100

## 2018-01-06 MED ORDER — CLOPIDOGREL BISULFATE 75 MG PO TABS: 75.0000 mg | ORAL_TABLET | Freq: Every day | ORAL | 11 refills | Status: AC

## 2018-01-06 MED ORDER — SODIUM CHLORIDE 0.9% FLUSH: 3.0000 mL | INTRAVENOUS | Status: DC | PRN

## 2018-01-06 MED ORDER — CLOPIDOGREL BISULFATE 75 MG PO TABS: 75.0000 mg | ORAL_TABLET | Freq: Every day | ORAL | Status: DC

## 2018-01-06 MED ORDER — ATORVASTATIN CALCIUM 10 MG PO TABS: 10.0000 mg | ORAL_TABLET | Freq: Every day | ORAL | 11 refills | Status: DC

## 2018-01-06 MED ORDER — SODIUM CHLORIDE 0.9 % IV SOLN
INTRAVENOUS | Status: DC
  Administered 2018-01-06: 1000 mL via INTRAVENOUS

## 2018-01-06 MED ORDER — SODIUM CHLORIDE 0.9 % IV BOLUS
250.0000 mL | Freq: Once | INTRAVENOUS | Status: AC
  Administered 2018-01-06: 250 mL via INTRAVENOUS

## 2018-01-06 MED ORDER — SODIUM CHLORIDE 0.9 % IV SOLN: 250.0000 mL | INTRAVENOUS | Status: DC | PRN

## 2018-01-06 MED ORDER — ASPIRIN EC 81 MG PO TBEC
81.0000 mg | DELAYED_RELEASE_TABLET | Freq: Every day | ORAL | Status: DC
  Administered 2018-01-06: 81 mg via ORAL
  Filled 2018-01-06 (×2): qty 1

## 2018-01-06 MED ORDER — CEFAZOLIN SODIUM-DEXTROSE 2-4 GM/100ML-% IV SOLN
2.0000 g | Freq: Once | INTRAVENOUS | Status: AC
  Administered 2018-01-06: 2 g via INTRAVENOUS

## 2018-01-06 MED ORDER — FAMOTIDINE 20 MG PO TABS: 40.0000 mg | ORAL_TABLET | Freq: Once | ORAL | Status: DC

## 2018-01-06 MED ORDER — LIDOCAINE-EPINEPHRINE (PF) 1 %-1:200000 IJ SOLN
INTRAMUSCULAR | Status: AC
  Filled 2018-01-06: qty 30

## 2018-01-06 MED ORDER — FENTANYL CITRATE (PF) 100 MCG/2ML IJ SOLN
INTRAMUSCULAR | Status: AC
  Filled 2018-01-06: qty 2

## 2018-01-06 MED ORDER — ACETAMINOPHEN 325 MG PO TABS: 650.0000 mg | ORAL_TABLET | ORAL | Status: DC | PRN

## 2018-01-06 MED ORDER — MIDAZOLAM HCL 2 MG/2ML IJ SOLN
INTRAMUSCULAR | Status: DC | PRN
  Administered 2018-01-06: 2 mg via INTRAVENOUS
  Administered 2018-01-06 (×2): 1 mg via INTRAVENOUS

## 2018-01-06 MED ORDER — LABETALOL HCL 5 MG/ML IV SOLN: 10.0000 mg | INTRAVENOUS | Status: DC | PRN

## 2018-01-06 MED ORDER — HEPARIN (PORCINE) IN NACL 1000-0.9 UT/500ML-% IV SOLN
INTRAVENOUS | Status: AC
  Filled 2018-01-06: qty 1000

## 2018-01-06 SURGICAL SUPPLY — 18 items
BALLN LUTONIX 018 5X150X130 (BALLOONS) ×3
BALLN ULTRVRSE 2.5X300X150 (BALLOONS) ×3
BALLOON LUTONIX 018 5X150X130 (BALLOONS) IMPLANT
BALLOON ULTRVRSE 2.5X300X150 (BALLOONS) IMPLANT
CATH BEACON 5 .038 100 VERT TP (CATHETERS) ×2 IMPLANT
CATH CXI SUPP ANG 4FR 135 (CATHETERS) IMPLANT
CATH CXI SUPP ANG 4FR 135CM (CATHETERS) ×3
CATH PIG 70CM (CATHETERS) ×2 IMPLANT
DEVICE PRESTO INFLATION (MISCELLANEOUS) ×2 IMPLANT
DEVICE STARCLOSE SE CLOSURE (Vascular Products) ×2 IMPLANT
GLIDEWIRE ADV .035X260CM (WIRE) ×2 IMPLANT
PACK ANGIOGRAPHY (CUSTOM PROCEDURE TRAY) ×3 IMPLANT
SHEATH BRITE TIP 5FRX11 (SHEATH) ×2 IMPLANT
SHEATH RAABE 6FRX70 (SHEATH) ×2 IMPLANT
STENT VIABAHN 6X150X120 (Permanent Stent) ×2 IMPLANT
TUBING CONTRAST HIGH PRESS 72 (TUBING) ×2 IMPLANT
WIRE G V18X300CM (WIRE) ×2 IMPLANT
WIRE J 3MM .035X145CM (WIRE) ×2 IMPLANT

## 2018-01-06 NOTE — H&P (Signed)
[facility] History & Physical Update  The patient was interviewed and re-examined.  The patient's previous History and Physical has been reviewed and is unchanged.  There is no change in the plan of care. We plan to proceed with the scheduled procedure.  Leotis Pain, MD  01/06/2018, 8:08 AM

## 2018-01-06 NOTE — Op Note (Signed)
[facility]  Percutaneous Study/Intervention Procedural Note   Date of Surgery: 01/06/2018  Surgeon(s):DEW,JASON    Assistants:none  Pre-operative Diagnosis: PAD with ulceration right lower extremity  Post-operative diagnosis:  Same  Procedure(s) Performed:             1.  Ultrasound guidance for vascular access left femoral artery             2.  Catheter placement into right posterior tibial artery from left femoral approach             3.  Aortogram and selective right lower extremity angiogram             4.  Percutaneous transluminal angioplasty of right posterior tibial artery with 2.5 mm diameter by 30 cm length angioplasty balloon             5.   Percutaneous transluminal angioplasty of the right most distal SFA and popliteal artery with 5 mm diameter by 15 cm length Lutonix drug-coated angioplasty balloon  6.  Viabahn stent placement to the right popliteal artery with 6 mm diameter by 15 cm length stent             7.  StarClose closure device left femoral artery  EBL: 5 cc  Contrast: 80 cc  Fluoro Time: 7.4 minutes  Moderate Conscious Sedation Time: approximately 45 minutes using 4 mg of Versed and 87.5 Mcg of Fentanyl              Indications:  Patient is a 84 y.o.female with nonhealing ulcerations on the right foot. The patient has noninvasive study showing monophasic reduced blood flow with a reduced ABI digital pressure. The patient is brought in for angiography for further evaluation and potential treatment.  Due to the limb threatening nature of the situation, angiogram was performed for attempted limb salvage. The patient is aware that if the procedure fails, amputation would be expected.  The patient also understands that even with successful revascularization, amputation may still be required due to the severity of the situation.  Risks and benefits are discussed and informed consent is obtained.   Procedure:  The patient was identified and  appropriate procedural time out was performed.  The patient was then placed supine on the table and prepped and draped in the usual sterile fashion. Moderate conscious sedation was administered during a face to face encounter with the patient throughout the procedure with my supervision of the RN administering medicines and monitoring the patient's vital signs, pulse oximetry, telemetry and mental status throughout from the start of the procedure until the patient was taken to the recovery room. Ultrasound was used to evaluate the left common femoral artery.  It was patent .  A digital ultrasound image was acquired.  A Seldinger needle was used to access the left common femoral artery under direct ultrasound guidance and a permanent image was performed.  A 0.035 J wire was advanced without resistance and a 5Fr sheath was placed.  Pigtail catheter was placed into the aorta and an AP aortogram was performed. This demonstrated normal renal arteries (with two on the left) and normal aorta and iliac segments without significant stenosis. I then crossed the aortic bifurcation and advanced to the right femoral head. Selective right lower extremity angiogram was then performed. This demonstrated normal common femoral artery, profunda femoris artery, and proximal superficial femoral artery.  In the most distal superficial femoral artery and proximal popliteal artery there was an occlusion  which reconstituted just above the knee.  The vessel then normalized to a reasonably normal tibial trifurcation with the peroneal artery which was continuous.  The posterior tibial artery had a reasonably long segment occlusion but did reconstitute just above the ankle through collaterals.  The anterior tibial artery similarly had a long segment occlusion with reconstitution at the ankle. The patient was systemically heparinized and a 6 French 70 cm sheath was then placed over the Terumo Advantage wire. I then used a Kumpe catheter and the  advantage wire to navigate through the SFA and popliteal occlusion and confirm intraluminal flow in the tibioperoneal trunk.  I then exchanged for a V 18 wire and a CXI catheter and cannulated the posterior tibial artery and cross this occlusion without difficulty of parking the wire in the foot.  I then proceeded with treatment.  A 2.5 mm diameter by 30 cm length angioplasty balloon was inflated from just above the ankle up through the tibioperoneal trunk area this was taken to 10 atm for 1 minute.  A 5 mm diameter by 15 cm length Lutonix drug-coated angioplasty balloon was inflated to 12 atm for 1 minute and the most distal SFA and popliteal artery.  Completion angiogram showed high-grade residual stenosis and irregularity in the popliteal artery after angioplasty at the site of occlusion.  The posterior tibial artery was now continuous into the foot with no greater than 30% residual stenosis and was now the dominant runoff to the foot with the peroneal artery also providing a secondary runoff vessel.  I elected to place a stent in the popliteal artery.  A 6 mm diameter by 15 cm length Viabahn stent was deployed from just below the knee up to the proximal popliteal artery and postdilated with a 5 mm balloon with excellent angiographic completion result and less than 10% residual stenosis. I elected to terminate the procedure. The sheath was removed and StarClose closure device was deployed in the left femoral artery with excellent hemostatic result. The patient was taken to the recovery room in stable condition having tolerated the procedure well.  Findings:               Aortogram:  normal aorta and iliac arteries although tortuous.  One right renal artery and 2 left renal arteries without significant stenosis             Right lower Extremity:  normal common femoral artery, profunda femoris artery, and proximal superficial femoral artery.  In the most distal superficial femoral artery and proximal popliteal  artery there was an occlusion which reconstituted just above the knee.  The vessel then normalized to a reasonably normal tibial trifurcation with the peroneal artery which was continuous.  The posterior tibial artery had a reasonably long segment occlusion but did reconstitute just above the ankle through collaterals.  The anterior tibial artery similarly had a long segment occlusion with reconstitution at the ankle.   Disposition: Patient was taken to the recovery room in stable condition having tolerated the procedure well.  Complications: None  Ashley [name] 01/06/2018 9:34 AM   This note was created with Dragon Medical transcription system. Any errors in dictation are purely unintentional.

## 2018-01-06 NOTE — Discharge Instructions (Signed)
Ankle-Brachial Index Test The ankle-brachial index (ABI) test is used to find peripheral vascular disease (PVD). PVD is also known as peripheral arterial disease (PAD). PVD is the blocking or hardening of the arteries anywhere within the circulatory system beyond the heart. PVD is caused by cholesterol deposits in your blood vessels (atherosclerosis). These deposits cause arteries to narrow. The delivery of oxygen to your tissues is impaired as a result. This can cause muscle pain and fatigue. This is called claudication. PVD means there may also be buildup of cholesterol in your:  Heart. This increases the risk of heart attacks.  Brain. This increases the risk of strokes.  The ankle-brachial index test measures the blood flow in your arms and legs. This test also determines if blood vessels in your leg are narrowed by cholesterol deposits. There are additional causes of a reduced ankle-brachial index, such as inflammation of vessels or a clot in the vessels. However, these are much less common than narrowing due to cholesterol deposits. What is being tested? The test is done while you are lying down and resting. Measurements are taken of the systolic pressure:  In your arm (brachial).  In your ankle at several points along your leg.  Systolic pressure is the pressure inside your arteries when your heart pumps. The measurements are taken several times on both sides. Then, the highest systolic pressure of the ankle is divided by the highest brachial systolic pressure. The result is the ankle-brachial pressure ratio, or ABI. Sometimes this test is repeated after you have exercised on a treadmill for five minutes. You may have leg pain during the exercise portion of the test if you suffer from PAD. If the index number drops after exercise, this may show that PAD is present. A normal ABI ratio is between 0.9 and 1.4. A value below 0.9 is considered abnormal. This information is not intended to replace  advice given to you by your health care provider. Make sure you discuss any questions you have with your health care provider. Document Released: 05/18/2004 Document Revised: 10/20/2015 Document Reviewed: 12/18/2013 Elsevier Interactive Patient Education  [employer].

## 2018-01-06 NOTE — Progress Notes (Signed)
Dr. Lucky Cowboy at bedside, speaking with family and patient. All verbalized understanding of conversation.

## 2018-01-07 ENCOUNTER — Ambulatory Visit: Payer: Medicare Other | Admitting: Nurse Practitioner

## 2018-01-10 DIAGNOSIS — I70235 Atherosclerosis of native arteries of right leg with ulceration of other part of foot: Secondary | ICD-10-CM | POA: Diagnosis not present

## 2018-01-17 ENCOUNTER — Ambulatory Visit (INDEPENDENT_AMBULATORY_CARE_PROVIDER_SITE_OTHER): Payer: Medicare Other

## 2018-01-17 ENCOUNTER — Other Ambulatory Visit (INDEPENDENT_AMBULATORY_CARE_PROVIDER_SITE_OTHER): Payer: Self-pay | Admitting: Vascular Surgery

## 2018-01-17 ENCOUNTER — Ambulatory Visit (INDEPENDENT_AMBULATORY_CARE_PROVIDER_SITE_OTHER): Payer: Medicare Other | Admitting: Vascular Surgery

## 2018-01-17 ENCOUNTER — Encounter (INDEPENDENT_AMBULATORY_CARE_PROVIDER_SITE_OTHER): Payer: Self-pay | Admitting: Vascular Surgery

## 2018-01-17 VITALS — BP 124/69 | HR 80 | Resp 12 | Ht 65.0 in | Wt 136.0 lb

## 2018-01-17 DIAGNOSIS — N183 Chronic kidney disease, stage 3 (moderate): Secondary | ICD-10-CM | POA: Diagnosis not present

## 2018-01-17 DIAGNOSIS — I739 Peripheral vascular disease, unspecified: Secondary | ICD-10-CM | POA: Diagnosis not present

## 2018-01-17 DIAGNOSIS — M150 Primary generalized (osteo)arthritis: Secondary | ICD-10-CM

## 2018-01-17 DIAGNOSIS — E1122 Type 2 diabetes mellitus with diabetic chronic kidney disease: Secondary | ICD-10-CM | POA: Diagnosis not present

## 2018-01-17 DIAGNOSIS — L97909 Non-pressure chronic ulcer of unspecified part of unspecified lower leg with unspecified severity: Secondary | ICD-10-CM

## 2018-01-17 DIAGNOSIS — I10 Essential (primary) hypertension: Secondary | ICD-10-CM

## 2018-01-17 DIAGNOSIS — Z86718 Personal history of other venous thrombosis and embolism: Secondary | ICD-10-CM

## 2018-01-17 DIAGNOSIS — M159 Polyosteoarthritis, unspecified: Secondary | ICD-10-CM

## 2018-01-17 DIAGNOSIS — I6523 Occlusion and stenosis of bilateral carotid arteries: Secondary | ICD-10-CM | POA: Diagnosis not present

## 2018-01-17 NOTE — Assessment & Plan Note (Signed)
blood glucose control important in reducing the progression of atherosclerotic disease. Also, involved in wound healing. On appropriate medications.  

## 2018-01-17 NOTE — Assessment & Plan Note (Signed)
Her noninvasive studies today show noncompressible vessels.  Her digital pressure is still reduced, but her digital waveform is markedly improved over what was seen prior to revascularization.  I think it is unlikely that her right second toe is going to heal.  I suspect there is joint and bone involvement and removal of the toe will likely be required.  We will recheck this in a week or 2 and if there is not significant improvement, I think going ahead and proceeding with digital amputation would be in her best interest.  The posterior calf wound seems to be improving.  Continue local wound care

## 2018-01-17 NOTE — Progress Notes (Signed)
MRN : [id number]  Ashley [name] is a 84 y.o. (08-Dec-1918) female who presents with chief complaint of  Chief Complaint  Patient presents with  . Follow-up    [facility] ABI  .  History of Present Illness: Patient returns today in follow up of PAD and ulceration of the right leg and foot.  She underwent revascularization of the right lower extremity 2 weeks ago.  Her noninvasive studies today show noncompressible vessels.  Her digital pressure is still reduced, but her digital waveform is markedly improved over what was seen prior to revascularization.  Her right posterior calf ulceration is clean and appears to be healing.  There is fair granulation tissue.  The right second toe has a dark eschar overlying the distal interphalangeal joint that is over a centimeter in diameter.  There is mild surrounding erythema.  Her niece says she was started on antibiotics earlier this week.  Current Outpatient Medications  Medication Sig Dispense Refill  . acetaminophen (MAPAP ARTHRITIS PAIN) 650 MG CR tablet Take 650 mg 2 (two) times daily by mouth.     . Amino Acids-Protein Hydrolys (FEEDING SUPPLEMENT, PRO-STAT SUGAR FREE 64,) LIQD Take 30 mLs by mouth daily. To Promote Wound Healing. Wound on right lower leg    . aspirin EC 81 MG tablet Take 1 tablet (81 mg total) by mouth daily. 150 tablet 2  . atorvastatin (LIPITOR) 10 MG tablet Take 1 tablet (10 mg total) by mouth daily. 30 tablet 11  . camphor-menthol (SARNA) lotion Apply liberal amount to leg around the wound/dressing for itching BID and PRN. OK to use other places of itching. DO Not use on the wound. Okay to leave on bedside    . Cholecalciferol (VITAMIN D3) 2000 units capsule Take 2,000 Units by mouth daily.    . clopidogrel (PLAVIX) 75 MG tablet Take 1 tablet (75 mg total) by mouth daily. 30 tablet 11  . collagenase (SANTYL) ointment Apply topically daily. Cleanse right leg wound with ns, skin prep, around wound, apply santyl 1/4 in. thick to wound  base, cover with ns moistened gauze, cover with allevyn dressing    . ENSURE (ENSURE) Take 237 mLs by mouth daily.    Marland Kitchen gabapentin (NEURONTIN) 100 MG capsule Take 100 mg by mouth 3 (three) times daily as needed.    . Hydroactive Dressings (DUODERM HYDROACTIVE EX) Apply topically. Cut a DuoDerm into 4 pieces . Use 1 at a time. Change Q 5 days or sooner if needed. Using this method for padding bone prominence.    Marland Kitchen levothyroxine (SYNTHROID, LEVOTHROID) 88 MCG tablet Take 88 mcg by mouth daily before breakfast.    . magnesium hydroxide (MILK OF MAGNESIA) 400 MG/5ML suspension Take 30 mLs by mouth every 4 (four) hours as needed for mild constipation. Constipation/ no BM for 2 days    . Magnesium Oxide 250 MG TABS Take 250 mg by mouth daily.    . pantoprazole (PROTONIX) 40 MG tablet Take 1 tablet (40 mg total) by mouth daily. Switch for any other PPI at similar dose and frequency 30 tablet 3  . Probiotic Product (RISA-BID PROBIOTIC) TABS Take 1 tablet by mouth daily.     . traZODone (DESYREL) 50 MG tablet Take 50 mg at bedtime by mouth.    Loura Pardon Salicylate (ASPERCREME) 10 % LOTN Apply thin film topically to areas of pain 3 times a day as needed    . UNABLE TO FIND Diet: Cardiac diet, Low NA    .  vitamin B-12 (CYANOCOBALAMIN) 500 MCG tablet Take 500 mcg by mouth daily.     No current facility-administered medications for this visit.     Past Medical History:  Diagnosis Date  . Acute gouty arthritis 11/21/2016  . Aortic atherosclerosis ([place]) 09/04/2016  . Carotid artery disease ([place]) 11/21/2016  . Carotid stenosis 12/03/2015   Advanced heavily calcified atherosclerotic disease at both carotid bifurcations. Stenoses measured at 70% affecting the right proximal ICA and 80% affecting the left proximal ICA.  Marland Kitchen Chronic kidney disease   . Coronary atherosclerosis 09/04/2016  . Diverticulitis   . DM (diabetes mellitus), type 2 ([place]) 09/04/2016  . GERD (gastroesophageal reflux disease)   . Gout   .  HTN, goal below 140/90 11/21/2016  . Hyperlipidemia 06/20/2016  . Hypothyroidism, unspecified 11/21/2016  . Primary osteoarthritis 11/21/2016   involving multiple joints  . Stroke [facility])     Past Surgical History:  Procedure Laterality Date  . CATARACT EXTRACTION Bilateral   . COLONOSCOPY    . Finger crystal removal     right 3rd  . LOWER EXTREMITY ANGIOGRAPHY Right 01/06/2018   Procedure: LOWER EXTREMITY ANGIOGRAPHY;  Surgeon: [name], MD;  Location: [facility];  Service: Cardiovascular;  Laterality: Right;  . toe crystal removal     right 1st toe    Social History Social History   Tobacco Use  . Smoking status: Never Smoker  . Smokeless tobacco: Never Used  Substance Use Topics  . Alcohol use: No  . Drug use: No     Family History Family History  Problem Relation Age of Onset  . Hypertension Sister   . Arthritis Sister   . Heart disease Sister   . Alcohol abuse Brother   . Stroke Brother   . Hypertension Brother   . Alcohol abuse Brother   . Stroke Brother   . Hypertension Brother   . Hypertension Brother   . Hypertension Sister     No Known Allergies   REVIEW OF SYSTEMS (Negative unless checked) Constitutional: '[x]' Weight loss'[]' Fever'[]' Chills Cardiac:'[]' Chest pain'[]' Chest pressure'[]' Palpitations '[]' Shortness of breath when laying flat '[]' Shortness of breath with exertion. Vascular: '[x]' Pain in legs with walking'[]' Pain in legswith standing'[x]' History of DVT '[]' Phlebitis '[]' Swelling in legs '[]' Varicose veins '[x]' Non-healing ulcers Pulmonary: '[]' Uses home oxygen '[]' Productive cough'[]' Hemoptysis '[]' Wheeze '[]' COPD '[]' Asthma Neurologic: '[]' Dizziness'[]' Seizures '[]' History of stroke '[]' History of TIA'[]' Aphasia '[]' Vissual changes'[]' Weaknessor numbness in arm '[x]' Weakness or numbnessin leg Musculoskeletal:'[x]' Joint swelling '[x]' Joint pain '[]' Low back pain Hematologic:'[]' Easy bruising'[]' Easy bleeding  '[]' Hypercoagulable state '[]' Anemic Gastrointestinal:'[]' Diarrhea '[]' Vomiting'[]' Gastroesophageal reflux/heartburn'[]' Difficulty swallowing. Genitourinary: '[]' Chronic kidney disease '[]' Difficulturination '[]' Frequenturination'[]' Blood in urine Skin: '[]' Rashes '[x]' Ulcers  Psychological: '[]' History of anxiety'[]' History of major depression.  Physical Examination  BP 124/69 (BP Location: Left Arm, Patient Position: Sitting)   Pulse 80   Resp 12   Ht '5\' 5"'  (1.651 m)   Wt 136 lb (61.7 kg)   BMI 22.63 kg/m  Gen:  WD/WN, NAD.  Appears younger than stated age Head: [place]/AT, No temporalis wasting. Ear/Nose/Throat: Hearing markedly diminished, nares w/o erythema or drainage Eyes: Conjunctiva clear. Sclera non-icteric Neck: Supple.  Trachea midline Pulmonary:  Good air movement, no use of accessory muscles.  Cardiac: Somewhat irregular Vascular:  Vessel Right Left  Radial Palpable Palpable                          PT  trace palpable  1+ palpable  DP  1+ palpable  1+ palpable    Musculoskeletal: Uses a wheelchair.  Irregular, 3 to 4 cm ulceration  on the posterior lateral right calf which is clean with granulation tissue and minimal fibrinous exudate.  Dark eschar on the right second toe as described above. Neurologic: Sensation grossly intact in extremities.  Symmetrical.  Speech is fluent.  Psychiatric: Judgment intact, Mood & affect appropriate for pt's clinical situation. Dermatologic: Ulcers as above       Labs Recent Results (from the past 2160 hour(s))  Brain natriuretic peptide     Status: None   Collection Time: 01/01/18  6:10 AM  Result Value Ref Range   B Natriuretic Peptide 79.0 0.0 - 100.0 pg/mL    Comment: Performed at [facility], [address] [place], [place] [address]  CBC with Differential/Platelet     Status: Abnormal   Collection Time: 01/01/18  6:10 AM  Result Value Ref Range   WBC 7.4 3.6 - 11.0 K/uL   RBC 3.39 (L) 3.80 - 5.20  MIL/uL   Hemoglobin 10.8 (L) 12.0 - 16.0 g/dL   HCT 32.2 (L) 35.0 - 47.0 %   MCV 94.8 80.0 - 100.0 fL   MCH 31.8 26.0 - 34.0 pg   MCHC 33.6 32.0 - 36.0 g/dL   RDW 13.9 11.5 - 14.5 %   Platelets 203 150 - 440 K/uL   Neutrophils Relative % 70 %   Neutro Abs 5.2 1.4 - 6.5 K/uL   Lymphocytes Relative 13 %   Lymphs Abs 0.9 (L) 1.0 - 3.6 K/uL   Monocytes Relative 13 %   Monocytes Absolute 0.9 0.2 - 0.9 K/uL   Eosinophils Relative 3 %   Eosinophils Absolute 0.2 0 - 0.7 K/uL   Basophils Relative 1 %   Basophils Absolute 0.1 0 - 0.1 K/uL    Comment: Performed at [facility], [address] [place], [place] [address]  TSH     Status: None   Collection Time: 01/01/18  6:10 AM  Result Value Ref Range   TSH 2.753 0.350 - 4.500 uIU/mL    Comment: Performed by a 3rd Generation assay with a functional sensitivity of <=0.01 uIU/mL. Performed at [facility], [address] [place], [place] [address]   BUN     Status: Abnormal   Collection Time: 01/03/18 12:25 PM  Result Value Ref Range   BUN 43 (H) 8 - 23 mg/dL    Comment: Performed at [facility], [address] [place], [place] [address]  Creatinine, serum     Status: Abnormal   Collection Time: 01/03/18 12:25 PM  Result Value Ref Range   Creatinine, Ser 1.19 (H) 0.44 - 1.00 mg/dL   GFR calc non Af Amer 37 (L) >60 mL/min   GFR calc Af Amer 43 (L) >60 mL/min    Comment: (NOTE) The eGFR has been calculated using the CKD EPI equation. This calculation has not been validated in all clinical situations. eGFR's persistently <60 mL/min signify possible Chronic Kidney Disease. Performed at [facility], [address] [place], [place] [address]   Glucose, capillary     Status: Abnormal   Collection Time: 01/06/18  7:03 AM  Result Value Ref Range   Glucose-Capillary 175 (H) 70 - 99 mg/dL    Radiology No results found.  Assessment/Plan  Type 2 diabetes mellitus with stage 3 chronic kidney  disease, without long-term current use of insulin (HCC) blood glucose control important in reducing the progression of atherosclerotic disease. Also, involved in wound healing. On appropriate medications.   Peripheral vascular disease of lower extremity with  ulceration ([place]) Her noninvasive studies today show noncompressible vessels.  Her digital pressure is still reduced, but her digital waveform is markedly improved over what was seen prior to revascularization.  I think it is unlikely that her right second toe is going to heal.  I suspect there is joint and bone involvement and removal of the toe will likely be required.  We will recheck this in a week or 2 and if there is not significant improvement, I think going ahead and proceeding with digital amputation would be in her best interest.  The posterior calf wound seems to be improving.  Continue local wound care   Essential hypertension Continue antihypertensive medications as already ordered, these medications have been reviewed and there are no changes at this time.    Primary osteoarthritis involving multiple joints Continue NSAID medications as already ordered, these medications have been reviewed and there are no changes at this time.    History of DVT (deep vein thrombosis)  Ms. Moch was seen in our office in 2017 for a left lower extremity DVT.  This DVT has since resolved.  No evidence of lasting injury.  Leotis Pain, MD  01/17/2018 4:11 PM    This note was created with Dragon medical transcription system.  Any errors from dictation are purely unintentional

## 2018-01-20 ENCOUNTER — Other Ambulatory Visit
Admission: RE | Admit: 2018-01-20 | Discharge: 2018-01-20 | Disposition: A | Discharge status: Home / Self Care | Payer: Medicare Other | Source: Ambulatory Visit | Attending: Adult Health | Admitting: Adult Health

## 2018-01-20 ENCOUNTER — Encounter: Payer: Self-pay | Admitting: Adult Health

## 2018-01-20 ENCOUNTER — Non-Acute Institutional Stay (SKILLED_NURSING_FACILITY): Payer: Medicare Other | Admitting: Adult Health

## 2018-01-20 DIAGNOSIS — L97909 Non-pressure chronic ulcer of unspecified part of unspecified lower leg with unspecified severity: Secondary | ICD-10-CM

## 2018-01-20 DIAGNOSIS — K219 Gastro-esophageal reflux disease without esophagitis: Secondary | ICD-10-CM | POA: Diagnosis not present

## 2018-01-20 DIAGNOSIS — I2510 Atherosclerotic heart disease of native coronary artery without angina pectoris: Secondary | ICD-10-CM | POA: Diagnosis not present

## 2018-01-20 DIAGNOSIS — E034 Atrophy of thyroid (acquired): Secondary | ICD-10-CM | POA: Diagnosis not present

## 2018-01-20 DIAGNOSIS — I739 Peripheral vascular disease, unspecified: Secondary | ICD-10-CM

## 2018-01-20 DIAGNOSIS — E1122 Type 2 diabetes mellitus with diabetic chronic kidney disease: Secondary | ICD-10-CM | POA: Insufficient documentation

## 2018-01-20 NOTE — Progress Notes (Signed)
Location:    [facility] Room Number: 704U Place of Service:  SNF (31)   CODE STATUS: DNR  No Known Allergies  Chief Complaint  Patient presents with  . Medical Management of Chronic Issues    Cad; pvd; gerd; hypothyroidism    HPI:  She is a long term resident of this facility being seen for the management of her chronic illnesses: cad; pvd; gerd; hypothyroidism. She has been seen by vein and vascular regarding her left 2nd toe. She may need an amputation. She denies any chest pain or shortness of breath; no heart burn. There are no nursing concerns at this time.   Past Medical History:  Diagnosis Date  . Acute gouty arthritis 11/21/2016  . Aortic atherosclerosis ([place]) 09/04/2016  . Carotid artery disease ([place]) 11/21/2016  . Carotid stenosis 12/03/2015   Advanced heavily calcified atherosclerotic disease at both carotid bifurcations. Stenoses measured at 70% affecting the right proximal ICA and 80% affecting the left proximal ICA.  Marland Kitchen Chronic kidney disease   . Coronary atherosclerosis 09/04/2016  . Diverticulitis   . DM (diabetes mellitus), type 2 ([place]) 09/04/2016  . GERD (gastroesophageal reflux disease)   . Gout   . HTN, goal below 140/90 11/21/2016  . Hyperlipidemia 06/20/2016  . Hypothyroidism, unspecified 11/21/2016  . Primary osteoarthritis 11/21/2016   involving multiple joints  . Stroke [facility])     Past Surgical History:  Procedure Laterality Date  . CATARACT EXTRACTION Bilateral   . COLONOSCOPY    . Finger crystal removal     right 3rd  . LOWER EXTREMITY ANGIOGRAPHY Right 01/06/2018   Procedure: LOWER EXTREMITY ANGIOGRAPHY;  Surgeon: [name], MD;  Location: [facility];  Service: Cardiovascular;  Laterality: Right;  . toe crystal removal     right 1st toe    Social History   Socioeconomic History  . Marital status: Widowed    Spouse name: Not on file  . Number of children: 0  . Years of education: 10  . Highest  education level: 10th grade  Occupational History  . Not on file  Social Needs  . Financial resource strain: Not on file  . Food insecurity:    Worry: Not on file    Inability: Not on file  . Transportation needs:    Medical: Not on file    Non-medical: Not on file  Tobacco Use  . Smoking status: Never Smoker  . Smokeless tobacco: Never Used  Substance and Sexual Activity  . Alcohol use: No  . Drug use: No  . Sexual activity: Not Currently  Lifestyle  . Physical activity:    Days per week: Not on file    Minutes per session: Not on file  . Stress: Not on file  Relationships  . Social connections:    Talks on phone: Not on file    Gets together: Not on file    Attends religious service: Not on file    Active member of club or organization: Not on file    Attends meetings of clubs or organizations: Not on file    Relationship status: Not on file  . Intimate partner violence:    Fear of current or ex partner: Not on file    Emotionally abused: Not on file    Physically abused: Not on file    Forced sexual activity: Not on file  Other Topics Concern  . Not on file  Social History Narrative   Admitted to  [facility] 11/16/2016   Widowed   No children   Does not drink alcohol   Never smoker   DNR   Family History  Problem Relation Age of Onset  . Hypertension Sister   . Arthritis Sister   . Heart disease Sister   . Alcohol abuse Brother   . Stroke Brother   . Hypertension Brother   . Alcohol abuse Brother   . Stroke Brother   . Hypertension Brother   . Hypertension Brother   . Hypertension Sister       VITAL SIGNS BP (!) 142/73   Pulse 75   Temp 97.7 F (36.5 C) (Oral)   Resp 18   Ht 5\' 5"  (1.651 m)   Wt 134 lb 11.2 oz (61.1 kg)   SpO2 97%   BMI 22.42 kg/m   Outpatient Encounter Medications as of 01/20/2018  Medication Sig  . acetaminophen (MAPAP ARTHRITIS PAIN) 650 MG CR tablet Take 650 mg 2 (two) times daily by mouth.   Marland Kitchen acetaminophen  (TYLENOL) 325 MG tablet Take 650 mg by mouth every 4 (four) hours as needed. for pain/ increased temp. May be administered orally, per G-tube if needed or rectally if unable to swallow (separate order). Maximum dose for 24 hours is 3,000 mg from all sources of Acetaminophen/ Tylenol  . Amino Acids-Protein Hydrolys (FEEDING SUPPLEMENT, PRO-STAT SUGAR FREE 64,) LIQD Take 30 mLs by mouth daily. To Promote Wound Healing. Wound on right lower leg  . amoxicillin-clavulanate (AUGMENTIN) 875-125 MG tablet Take 1 tablet by mouth 2 (two) times daily.  Marland Kitchen aspirin EC 81 MG tablet Take 1 tablet (81 mg total) by mouth daily.  Marland Kitchen atorvastatin (LIPITOR) 10 MG tablet Take 1 tablet (10 mg total) by mouth daily.  . camphor-menthol (SARNA) lotion Apply liberal amount to leg around the wound/dressing for itching BID and PRN. OK to use other places of itching. DO Not use on the wound. Okay to leave on bedside  . Cholecalciferol (VITAMIN D3) 2000 units capsule Take 2,000 Units by mouth daily.  . clopidogrel (PLAVIX) 75 MG tablet Take 1 tablet (75 mg total) by mouth daily.  . collagenase (SANTYL) ointment Apply topically daily. Cleanse right leg wound with ns, skin prep, around wound, apply santyl 1/4 in. thick to wound base, cover with ns moistened gauze, cover with allevyn dressing  . ENSURE (ENSURE) Take 237 mLs by mouth daily.  Marland Kitchen gabapentin (NEURONTIN) 100 MG capsule Take 100 mg by mouth 3 (three) times daily as needed.  Marland Kitchen levothyroxine (SYNTHROID, LEVOTHROID) 88 MCG tablet Take 88 mcg by mouth daily before breakfast.  . magnesium hydroxide (MILK OF MAGNESIA) 400 MG/5ML suspension Take 30 mLs by mouth every 4 (four) hours as needed for mild constipation. Constipation/ no BM for 2 days  . Magnesium Oxide 250 MG TABS Take 250 mg by mouth daily.  . pantoprazole (PROTONIX) 40 MG tablet Take 1 tablet (40 mg total) by mouth daily. Switch for any other PPI at similar dose and frequency  . Probiotic Product (RISA-BID PROBIOTIC)  TABS Take 1 tablet by mouth daily.   . traZODone (DESYREL) 50 MG tablet Take 50 mg at bedtime by mouth.  Loura Pardon Salicylate (ASPERCREME) 10 % LOTN Apply thin film topically to areas of pain 3 times a day as needed  . UNABLE TO FIND Diet: Cardiac diet, Low NA  . vitamin B-12 (CYANOCOBALAMIN) 500 MCG tablet Take 500 mcg by mouth daily.  . [DISCONTINUED] Hydroactive Dressings (DUODERM HYDROACTIVE EX) Apply  topically. Cut a DuoDerm into 4 pieces . Use 1 at a time. Change Q 5 days or sooner if needed. Using this method for padding bone prominence.   No facility-administered encounter medications on file as of 01/20/2018.      SIGNIFICANT DIAGNOSTIC EXAMS  LABS REVIEWED: PREVIOUS   08-13-17: wbc 3.8; hgb 10.4; hgb 31.1; mcv 95.9; plt 157; glucose 110 bun 29; creat 1.53; k+ 5.6; na++ 139; ca 8.9; liver normal albumin 3.2 chol 112; ldl 39; trig 142; hdl 45 mag 1.3; tsh 6.302; uric acid: 9.0; vit D 37.3; vit B 12: 1919 08-14-17: glucose 105; bun 32; creat 1.51; k+ 5.3; na ++ 140; ca 8.9;  09-24-17 tsh 0.747   TODAY:   01-01-18: wbc 7.4; hgb 10.8; hct 32.2; mcv 94.8; plt 203 tsh 2.753  Review of Systems  Constitutional: Negative for malaise/fatigue.  Respiratory: Negative for cough and shortness of breath.   Cardiovascular: Negative for chest pain, palpitations and leg swelling.  Gastrointestinal: Negative for abdominal pain, constipation and heartburn.  Musculoskeletal: Negative for back pain, joint pain and myalgias.  Skin: Negative.   Neurological: Negative for dizziness.  Psychiatric/Behavioral: The patient is not nervous/anxious.     Physical Exam  Constitutional: She appears well-developed and well-nourished. No distress.  Neck: No thyromegaly present.  Cardiovascular: Normal rate, regular rhythm and normal heart sounds.  Left pedal pulse: questionable presence Right pedal pulse very faint.   Pulmonary/Chest: Effort normal and breath sounds normal. No respiratory distress.    Abdominal: Soft. Bowel sounds are normal. She exhibits no distension. There is no tenderness.  Musculoskeletal: She exhibits no edema.  Able to move all extremities   Lymphadenopathy:    She has no cervical adenopathy.  Neurological: She is alert.  Skin: Skin is warm and dry. She is not diaphoretic.  Left second toe: necrotic wound: has seen vein and vascular: possible amputation   Psychiatric: She has a normal mood and affect.    ASSESSMENT/ PLAN:  TODAY:   1. Peripheral vascular disease of lower extremity with ulceration: is stable; will continue current treatment to lower extremity. Will continue neurontin 100 mg three times daily as needed for pain will continue tylenol 650 mg twice daily   2. Atherosclerotic heart disease of native coronary artery without angina pectoris: is stable will continue asa 81 mg daily  will monitor   3. Gastroesophageal reflux disease without esophagitis: is stable will continue protonix 40 mg daily   4. Hypothyroidism due to acquired atrophy of thyroid: is stable tsh 2.753: will continue synthroid 88 mcg daily   PREVIOUS   5. Type 2 diabetes mellitus with stage 3 chronic kidney disease without long term use of insulin: is stable will not make changes will monitor  6. CKD stage 3 due to type 2 diabetes mellitus: stable bun 32 creat 1.51  7. Primary osteo arthritis of multiple sites: is stable will continue tylenol 650 mg twice daily   8. Chronic gout due to renal impairment unspecified site without tophus: is stable will monitor   9. Insomnia is stable will continue trazodone 50 mg nightly   Will setup podiatry; eye exam; urine for micro-albumin; hgb a1c and pneumovax   MD is aware of resident's narcotic use and is in agreement with current plan of care. We will attempt to wean resident as apropriate   Ok Edwards NP [facility]  Contact [phone] Monday through Friday 8am- 5pm  After hours call [phone]

## 2018-01-21 ENCOUNTER — Ambulatory Visit (INDEPENDENT_AMBULATORY_CARE_PROVIDER_SITE_OTHER): Payer: Medicare Other | Admitting: Vascular Surgery

## 2018-01-21 ENCOUNTER — Other Ambulatory Visit
Admission: RE | Admit: 2018-01-21 | Discharge: 2018-01-21 | Disposition: A | Discharge status: Home / Self Care | Payer: Medicare Other | Source: Ambulatory Visit | Attending: Adult Health | Admitting: Adult Health

## 2018-01-21 DIAGNOSIS — E1122 Type 2 diabetes mellitus with diabetic chronic kidney disease: Secondary | ICD-10-CM | POA: Insufficient documentation

## 2018-01-21 LAB — HEMOGLOBIN A1C
HEMOGLOBIN A1C: 7.6 % — AB (ref 4.8–5.6)
Mean Plasma Glucose: 171.42 mg/dL

## 2018-01-22 LAB — MICROALBUMIN / CREATININE URINE RATIO
CREATININE, UR: 79.8 mg/dL
MICROALB UR: 46.6 ug/mL — AB
MICROALB/CREAT RATIO: 58.4 mg/g{creat} — AB (ref 0.0–30.0)

## 2018-01-26 ENCOUNTER — Encounter
Admission: RE | Admit: 2018-01-26 | Discharge: 2018-01-26 | Disposition: A | Discharge status: Home / Self Care | Payer: Medicare Other | Source: Ambulatory Visit | Attending: Internal Medicine | Admitting: Internal Medicine

## 2018-01-27 DIAGNOSIS — E034 Atrophy of thyroid (acquired): Secondary | ICD-10-CM | POA: Insufficient documentation

## 2018-01-28 ENCOUNTER — Ambulatory Visit (INDEPENDENT_AMBULATORY_CARE_PROVIDER_SITE_OTHER): Payer: Medicare Other | Admitting: Vascular Surgery

## 2018-01-29 ENCOUNTER — Encounter (INDEPENDENT_AMBULATORY_CARE_PROVIDER_SITE_OTHER): Payer: Self-pay

## 2018-01-29 ENCOUNTER — Encounter (INDEPENDENT_AMBULATORY_CARE_PROVIDER_SITE_OTHER): Payer: Self-pay | Admitting: Nurse Practitioner

## 2018-01-29 ENCOUNTER — Ambulatory Visit (INDEPENDENT_AMBULATORY_CARE_PROVIDER_SITE_OTHER): Payer: Medicare Other | Admitting: Nurse Practitioner

## 2018-01-29 VITALS — BP 112/64 | HR 82 | Resp 14 | Ht 62.0 in | Wt 132.0 lb

## 2018-01-29 DIAGNOSIS — I10 Essential (primary) hypertension: Secondary | ICD-10-CM | POA: Diagnosis not present

## 2018-01-29 DIAGNOSIS — K219 Gastro-esophageal reflux disease without esophagitis: Secondary | ICD-10-CM | POA: Diagnosis not present

## 2018-01-29 DIAGNOSIS — I739 Peripheral vascular disease, unspecified: Secondary | ICD-10-CM

## 2018-01-29 DIAGNOSIS — L97909 Non-pressure chronic ulcer of unspecified part of unspecified lower leg with unspecified severity: Secondary | ICD-10-CM

## 2018-01-29 DIAGNOSIS — I6523 Occlusion and stenosis of bilateral carotid arteries: Secondary | ICD-10-CM

## 2018-01-29 DIAGNOSIS — E785 Hyperlipidemia, unspecified: Secondary | ICD-10-CM | POA: Diagnosis not present

## 2018-01-30 ENCOUNTER — Encounter (INDEPENDENT_AMBULATORY_CARE_PROVIDER_SITE_OTHER): Payer: Self-pay | Admitting: Nurse Practitioner

## 2018-01-30 ENCOUNTER — Encounter: Payer: Medicare Other | Attending: Nurse Practitioner | Admitting: Nurse Practitioner

## 2018-01-30 DIAGNOSIS — L97519 Non-pressure chronic ulcer of other part of right foot with unspecified severity: Secondary | ICD-10-CM | POA: Insufficient documentation

## 2018-01-30 DIAGNOSIS — I70232 Atherosclerosis of native arteries of right leg with ulceration of calf: Secondary | ICD-10-CM | POA: Diagnosis not present

## 2018-01-30 DIAGNOSIS — I129 Hypertensive chronic kidney disease with stage 1 through stage 4 chronic kidney disease, or unspecified chronic kidney disease: Secondary | ICD-10-CM | POA: Diagnosis not present

## 2018-01-30 DIAGNOSIS — L97512 Non-pressure chronic ulcer of other part of right foot with fat layer exposed: Secondary | ICD-10-CM | POA: Diagnosis not present

## 2018-01-30 DIAGNOSIS — E1122 Type 2 diabetes mellitus with diabetic chronic kidney disease: Secondary | ICD-10-CM | POA: Insufficient documentation

## 2018-01-30 DIAGNOSIS — L97212 Non-pressure chronic ulcer of right calf with fat layer exposed: Secondary | ICD-10-CM | POA: Diagnosis not present

## 2018-01-30 DIAGNOSIS — Z95820 Peripheral vascular angioplasty status with implants and grafts: Secondary | ICD-10-CM | POA: Insufficient documentation

## 2018-01-30 DIAGNOSIS — E11622 Type 2 diabetes mellitus with other skin ulcer: Secondary | ICD-10-CM | POA: Diagnosis not present

## 2018-01-30 DIAGNOSIS — N183 Chronic kidney disease, stage 3 (moderate): Secondary | ICD-10-CM | POA: Insufficient documentation

## 2018-01-30 NOTE — Progress Notes (Signed)
Subjective:    Patient ID: Ashley [name], female    DOB: [birth year], 84 y.o.   MRN: [id number] Chief Complaint  Patient presents with  . Follow-up    2 week wound check    HPI  Ashley [name] is a 84 y.o. female who following up today for wound check of ulceration on her right lower extremity second digit as well as wound on right lateral ankle.  The lateral right ankle wound appears to be healing well, however there is weeping from the wound which is likely caused from reperfusion of the leg from angiogram done 2 weeks ago.  The patient has not been utilizing compression since angiogram.  The wound on the right lower extremity second toe however appears to be becoming larger, and more swollen with evidence of drainage from the wound.  The patient states that the wound is very painful, which is echoed by her nephew who is present with her today.   Constitutional: [] Weight loss  [] Fever  [] Chills Cardiac: [] Chest pain   [] Chest pressure   [] Palpitations   [] Shortness of breath when laying flat   [] Shortness of breath with exertion. Vascular:  [] Pain in legs with walking   [] Pain in legs with standing  [] History of DVT   [] Phlebitis   [x] Swelling in legs   [] Varicose veins   [x] Non-healing ulcers Pulmonary:   [] Uses home oxygen   [] Productive cough   [] Hemoptysis   [] Wheeze  [] COPD   [] Asthma Neurologic:  [] Dizziness   [] Seizures   [x] History of stroke   [] History of TIA  [] Aphasia   [] Vissual changes   [] Weakness or numbness in arm   [] Weakness or numbness in leg Musculoskeletal:   [] Joint swelling   [] Joint pain   [] Low back pain Hematologic:  [] Easy bruising  [] Easy bleeding   [] Hypercoagulable state   [] Anemic Gastrointestinal:  [] Diarrhea   [] Vomiting  [] Gastroesophageal reflux/heartburn   [] Difficulty swallowing. Genitourinary:  [] Chronic kidney disease   [] Difficult urination  [] Frequent urination   [] Blood in urine Skin:  [] Rashes   [x] Ulcers  Psychological:  [] History of anxiety   []   History of major depression.     Objective:   Physical Exam  BP 112/64 (BP Location: Left Arm, Patient Position: Sitting)   Pulse 82   Resp 14   Ht 5\' 2"  (1.575 m)   Wt 132 lb (59.9 kg)   BMI 24.14 kg/m   Past Medical History:  Diagnosis Date  . Acute gouty arthritis 11/21/2016  . Aortic atherosclerosis ([place]) 09/04/2016  . Carotid artery disease ([place]) 11/21/2016  . Carotid stenosis 12/03/2015   Advanced heavily calcified atherosclerotic disease at both carotid bifurcations. Stenoses measured at 70% affecting the right proximal ICA and 80% affecting the left proximal ICA.  Marland Kitchen Chronic kidney disease   . Coronary atherosclerosis 09/04/2016  . Diverticulitis   . DM (diabetes mellitus), type 2 ([place]) 09/04/2016  . GERD (gastroesophageal reflux disease)   . Gout   . HTN, goal below 140/90 11/21/2016  . Hyperlipidemia 06/20/2016  . Hypothyroidism, unspecified 11/21/2016  . Primary osteoarthritis 11/21/2016   involving multiple joints  . Stroke [facility])      Gen: WD/WN, NAD Head: [place]/AT, No temporalis wasting.  Ear/Nose/Throat: Hearing grossly intact, nares w/o erythema or drainage Eyes: PER, EOMI, sclera nonicteric.  Neck: Supple, no masses.  No JVD.  Pulmonary:  Good air movement, no use of accessory muscles.  Cardiac: RRR Vascular:  Vessel Right Left  Radial  palpable  palpable  Gastrointestinal: soft, non-distended. No guarding/no peritoneal signs.  Musculoskeletal: M/S 5/5 throughout.  No deformity or atrophy.  Neurologic: Pain and light touch intact in extremities.  Symmetrical.  Speech is fluent. Motor exam as listed above. Psychiatric: Judgment intact, Mood & affect appropriate for pt's clinical situation. Dermatologic: No Venous rashes.  Right lower extremity second digit ulcer that does not appear to be healing.  Right lower extremity lateral ankle ulcer which appears to be healing.  No changes consistent with cellulitis. Lymph : No Cervical lymphadenopathy, patient having  weeping which appears to be coming from the right lower extremity lateral ankle area.   Social History   Socioeconomic History  . Marital status: Widowed    Spouse name: Not on file  . Number of children: 0  . Years of education: 10  . Highest education level: 10th grade  Occupational History  . Not on file  Social Needs  . Financial resource strain: Not on file  . Food insecurity:    Worry: Not on file    Inability: Not on file  . Transportation needs:    Medical: Not on file    Non-medical: Not on file  Tobacco Use  . Smoking status: Never Smoker  . Smokeless tobacco: Never Used  Substance and Sexual Activity  . Alcohol use: No  . Drug use: No  . Sexual activity: Not Currently  Lifestyle  . Physical activity:    Days per week: Not on file    Minutes per session: Not on file  . Stress: Not on file  Relationships  . Social connections:    Talks on phone: Not on file    Gets together: Not on file    Attends religious service: Not on file    Active member of club or organization: Not on file    Attends meetings of clubs or organizations: Not on file    Relationship status: Not on file  . Intimate partner violence:    Fear of current or ex partner: Not on file    Emotionally abused: Not on file    Physically abused: Not on file    Forced sexual activity: Not on file  Other Topics Concern  . Not on file  Social History Narrative   Admitted to [facility] 11/16/2016   Widowed   No children   Does not drink alcohol   Never smoker   DNR    Past Surgical History:  Procedure Laterality Date  . CATARACT EXTRACTION Bilateral   . COLONOSCOPY    . Finger crystal removal     right 3rd  . LOWER EXTREMITY ANGIOGRAPHY Right 01/06/2018   Procedure: LOWER EXTREMITY ANGIOGRAPHY;  Surgeon: [name], MD;  Location: [place] [facility];  Service: Cardiovascular;  Laterality: Right;  . toe crystal removal     right 1st toe    Family History  Problem Relation  Age of Onset  . Hypertension Sister   . Arthritis Sister   . Heart disease Sister   . Alcohol abuse Brother   . Stroke Brother   . Hypertension Brother   . Alcohol abuse Brother   . Stroke Brother   . Hypertension Brother   . Hypertension Brother   . Hypertension Sister     No Known Allergies     Assessment & Plan:   1. Peripheral vascular disease of lower extremity with ulceration (HCC) Today the patient states ulcer on the second digit on the right lower extremity does not  appear to be healing, in fact it appears worse.  Due to the nature of the area of the wound, as well as the joint involvement it is not likely that the wound will heal over time.  In order to allow patient to have a better chance at healing, as well as to decrease likelihood of osteomyelitis development, we will plan for amputation of the toe. All risks, benefits, alternatives were explained to the patient and her nephew, who has healthcare power of attorney.  They both agreed to proceed with surgery.  The patient will follow-up in the office following amputation.  As far as the ulcer on the right lateral ankle aspect of her right lower extremity, this appears to be healing well with granulation tissue.  However, there is a concern that with continued swelling it could impede healing.  Wound dressings can continue to be done with Santyl ointment, however a compression wrap should be placed over the leg to control swelling to decrease the likelihood of wound expansion.  This was communicated to her care facility.  2. Essential hypertension Continue antihypertensive medications as already ordered, these medications have been reviewed and there are no changes at this time.   3. Hyperlipidemia, unspecified hyperlipidemia type Continue statin as ordered and reviewed, no changes at this time   4. Gastroesophageal reflux disease without esophagitis Continue PPI as already ordered, this medication has been reviewed  and there are no changes at this time.  Avoidence of caffeine and alcohol  Moderate elevation of the head of the bed    Current Outpatient Medications on File Prior to Visit  Medication Sig Dispense Refill  . acetaminophen (MAPAP ARTHRITIS PAIN) 650 MG CR tablet Take 650 mg 2 (two) times daily by mouth.     Marland Kitchen acetaminophen (TYLENOL) 325 MG tablet Take 650 mg by mouth every 4 (four) hours as needed. for pain/ increased temp. May be administered orally, per G-tube if needed or rectally if unable to swallow (separate order). Maximum dose for 24 hours is 3,000 mg from all sources of Acetaminophen/ Tylenol    . Amino Acids-Protein Hydrolys (FEEDING SUPPLEMENT, PRO-STAT SUGAR FREE 64,) LIQD Take 30 mLs by mouth daily. To Promote Wound Healing. Wound on right lower leg    . aspirin EC 81 MG tablet Take 1 tablet (81 mg total) by mouth daily. 150 tablet 2  . atorvastatin (LIPITOR) 10 MG tablet Take 1 tablet (10 mg total) by mouth daily. 30 tablet 11  . camphor-menthol (SARNA) lotion Apply liberal amount to leg around the wound/dressing for itching BID and PRN. OK to use other places of itching. DO Not use on the wound. Okay to leave on bedside    . Cholecalciferol (VITAMIN D3) 2000 units capsule Take 2,000 Units by mouth daily.    . clopidogrel (PLAVIX) 75 MG tablet Take 1 tablet (75 mg total) by mouth daily. 30 tablet 11  . collagenase (SANTYL) ointment Apply topically daily. Cleanse right leg wound with ns, skin prep, around wound, apply santyl 1/4 in. thick to wound base, cover with ns moistened gauze, cover with allevyn dressing    . ENSURE (ENSURE) Take 237 mLs by mouth daily.    Marland Kitchen gabapentin (NEURONTIN) 100 MG capsule Take 100 mg by mouth 3 (three) times daily as needed.    Marland Kitchen levothyroxine (SYNTHROID, LEVOTHROID) 88 MCG tablet Take 88 mcg by mouth daily before breakfast.    . magnesium hydroxide (MILK OF MAGNESIA) 400 MG/5ML suspension Take 30 mLs by mouth every  4 (four) hours as needed for mild  constipation. Constipation/ no BM for 2 days    . Magnesium Oxide 250 MG TABS Take 250 mg by mouth daily.    . pantoprazole (PROTONIX) 40 MG tablet Take 1 tablet (40 mg total) by mouth daily. Switch for any other PPI at similar dose and frequency 30 tablet 3  . Probiotic Product (RISA-BID PROBIOTIC) TABS Take 1 tablet by mouth daily.     . traZODone (DESYREL) 50 MG tablet Take 50 mg at bedtime by mouth.    Loura Pardon Salicylate (ASPERCREME) 10 % LOTN Apply thin film topically to areas of pain 3 times a day as needed    . UNABLE TO FIND Diet: Cardiac diet, Low NA    . vitamin B-12 (CYANOCOBALAMIN) 500 MCG tablet Take 500 mcg by mouth daily.     No current facility-administered medications on file prior to visit.     There are no Patient Instructions on file for this visit. No follow-ups on file.   Kris Hartmann, NP

## 2018-01-31 ENCOUNTER — Other Ambulatory Visit (INDEPENDENT_AMBULATORY_CARE_PROVIDER_SITE_OTHER): Payer: Self-pay | Admitting: Nurse Practitioner

## 2018-02-02 NOTE — Progress Notes (Signed)
NEVAEHA, FINERTY ([id number]) Visit Report for 01/30/2018 Abuse/Suicide Risk Screen Details Patient Name: [name], [name] Date of Service: 01/30/2018 1:00 PM Medical Record Number: [id number] Patient Account Number: [id number] Date of Birth/Sex: [birth year] (84 y.o. Female) Treating RN: Montey Hora Primary Care Chelsia Serres: Frazier Richards Other Clinician: Referring Royden Bulman: Frazier Richards Treating Maisley Hainsworth/Extender: Cathie Olden in Treatment: 0 Abuse/Suicide Risk Screen Items Answer ABUSE/SUICIDE RISK SCREEN: Has anyone close to you tried to hurt or harm you recentlyo No Do you feel uncomfortable with anyone in your familyo No Has anyone forced you do things that you didnot want to doo No Do you have any thoughts of harming yourselfo No Patient displays signs or symptoms of abuse and/or neglect. No Electronic Signature(s) Signed: 01/30/2018 5:08:53 PM By: Montey Hora Entered By: Montey Hora on 01/30/2018 13:25:51 [name], [name] ([id number]) -------------------------------------------------------------------------------- Activities of Daily Living Details Patient Name: [name], [name] Date of Service: 01/30/2018 1:00 PM Medical Record Number: [id number] Patient Account Number: [id number] Date of Birth/Sex: 08/03/1918 (84 y.o. Female) Treating RN: Montey Hora Primary Care Vinnie Bobst: Frazier Richards Other Clinician: Referring Seraiah Nowack: Frazier Richards Treating Henny Strauch/Extender: Cathie Olden in Treatment: 0 Activities of Daily Living Items Answer Activities of Daily Living (Please select one for each item) Drive Automobile Not Able Take Medications Need Assistance Use Telephone Need Assistance Care for Appearance Need Assistance Use Toilet Need Assistance Bath / Shower Need Assistance Dress Self Need Assistance Feed Self Need Assistance Walk Need Assistance Get In / Out Bed Need Assistance Housework Need Assistance Prepare Meals Not Able Handle Money  Not Able Shop for Self Not Able Electronic Signature(s) Signed: 01/30/2018 5:08:53 PM By: Montey Hora Entered By: Montey Hora on 01/30/2018 13:26:46 [name], [name] ([id number]) -------------------------------------------------------------------------------- Education Assessment Details Patient Name: [name], [name] Date of Service: 01/30/2018 1:00 PM Medical Record Number: [id number] Patient Account Number: [id number] Date of Birth/Sex: [birth year] (84 y.o. Female) Treating RN: Montey Hora Primary Care Jatinder Mcdonagh: Frazier Richards Other Clinician: Referring Chaitanya Amedee: Frazier Richards Treating Delorean Knutzen/Extender: Cathie Olden in Treatment: 0 Primary Learner Assessed: Caregiver SNF nurses Reason Patient is not Primary Learner: wound location Learning Preferences/Education Level/Primary Language Learning Preference: Printed Material Highest Education Level: College or Above Preferred Language: English Cognitive Barrier Assessment/Beliefs Language Barrier: No Translator Needed: No Memory Deficit: No Emotional Barrier: No Cultural/Religious Beliefs Affecting Medical Care: No Physical Barrier Assessment Impaired Vision: No Impaired Hearing: Yes very HOH Decreased Hand dexterity: No Knowledge/Comprehension Assessment Knowledge Level: Medium Comprehension Level: Medium Ability to understand written Medium instructions: Ability to understand verbal Medium instructions: Motivation Assessment Anxiety Level: Calm Cooperation: Cooperative Education Importance: Acknowledges Need Interest in Health Problems: Asks Questions Perception: Coherent Willingness to Engage in Self- Medium Management Activities: Readiness to Engage in Self- Medium Management Activities: Electronic Signature(s) Signed: 01/30/2018 5:08:53 PM By: Montey Hora Entered By: Montey Hora on 01/30/2018 13:27:26 [name], [name]  ([id number]) -------------------------------------------------------------------------------- Fall Risk Assessment Details Patient Name: [name], [name] Date of Service: 01/30/2018 1:00 PM Medical Record Number: [id number] Patient Account Number: [id number] Date of Birth/Sex: March 09, 1919 (84 y.o. Female) Treating RN: Montey Hora Primary Care Roselia Snipe: Frazier Richards Other Clinician: Referring Isahi Godwin: Frazier Richards Treating Derico Mitton/Extender: Cathie Olden in Treatment: 0 Fall Risk Assessment Items Have you had 2 or more falls in the last 12 monthso 0 No Have you had any fall that resulted in injury in the last 12 monthso 0 No FALL RISK ASSESSMENT: History of falling - immediate or within 3 months 0 No Secondary diagnosis 0 No Ambulatory aid  None/bed rest/wheelchair/nurse 0 No Crutches/cane/walker 15 Yes Furniture 0 No IV Access/Saline Lock 0 No Gait/Training Normal/bed rest/immobile 0 No Weak 10 Yes Impaired 20 Yes Mental Status Oriented to own ability 0 Yes Electronic Signature(s) Signed: 01/30/2018 5:08:53 PM By: Montey Hora Entered By: Montey Hora on 01/30/2018 13:27:41 [name], [name] ([id number]) -------------------------------------------------------------------------------- Foot Assessment Details Patient Name: [name], [name] Date of Service: 01/30/2018 1:00 PM Medical Record Number: [id number] Patient Account Number: [id number] Date of Birth/Sex: 1918/12/10 (84 y.o. Female) Treating RN: Montey Hora Primary Care Bevin Mayall: Frazier Richards Other Clinician: Referring Kayron Hicklin: Frazier Richards Treating Ardith Lewman/Extender: Cathie Olden in Treatment: 0 Foot Assessment Items Site Locations + = Sensation present, - = Sensation absent, C = Callus, U = Ulcer R = Redness, W = Warmth, M = Maceration, PU = Pre-ulcerative lesion F = Fissure, S = Swelling, D = Dryness Assessment Right: Left: Other Deformity: No No Prior Foot Ulcer: No No Prior  Amputation: No No Charcot Joint: No No Ambulatory Status: Ambulatory Without Help Gait: Steady Electronic Signature(s) Signed: 01/30/2018 5:08:53 PM By: Montey Hora Entered By: Montey Hora on 01/30/2018 13:28:58 [name], [name] ([id number]) -------------------------------------------------------------------------------- Nutrition Risk Assessment Details Patient Name: [name], [name] Date of Service: 01/30/2018 1:00 PM Medical Record Number: [id number] Patient Account Number: [id number] Date of Birth/Sex: 14-Jun-1918 (84 y.o. Female) Treating RN: Montey Hora Primary Care Theran Vandergrift: Frazier Richards Other Clinician: Referring Derion Kreiter: Frazier Richards Treating Alvine Mostafa/Extender: Cathie Olden in Treatment: 0 Height (in): Weight (lbs): Body Mass Index (BMI): Nutrition Risk Assessment Items NUTRITION RISK SCREEN: I have an illness or condition that made me change the kind and/or amount of 0 No food I eat I eat fewer than two meals per day 0 No I eat few fruits and vegetables, or milk products 0 No I have three or more drinks of beer, liquor or wine almost every day 0 No I have tooth or mouth problems that make it hard for me to eat 0 No I don't always have enough money to buy the food I need 0 No I eat alone most of the time 0 No I take three or more different prescribed or over-the-counter drugs a day 1 Yes Without wanting to, I have lost or gained 10 pounds in the last six months 0 No I am not always physically able to shop, cook and/or feed myself 0 No Nutrition Protocols Good Risk Protocol 0 No interventions needed Moderate Risk Protocol Electronic Signature(s) Signed: 01/30/2018 5:08:53 PM By: Montey Hora Entered By: Montey Hora on 01/30/2018 13:27:47

## 2018-02-02 NOTE — Progress Notes (Signed)
JENISSE, VULLO ([id number]) Visit Report for 01/30/2018 Allergy List Details Patient Name: Ayon, Tonye P. Date of Service: 01/30/2018 1:00 PM Medical Record Number: [id number] Patient Account Number: [id number] Date of Birth/Sex: 06/30/1918 (84 y.o. Female) Treating RN: Montey Hora Primary Care Charika Mikelson: Frazier Richards Other Clinician: Referring Ramondo Dietze: Frazier Richards Treating Paw Karstens/Extender: Lawanda Cousins Weeks in Treatment: 0 Allergies Active Allergies No Known Allergies Allergy Notes Electronic Signature(s) Signed: 01/30/2018 5:08:53 PM By: Montey Hora Entered By: Montey Hora on 01/30/2018 13:14:27 Fabrizio, Mija P. ([id number]) -------------------------------------------------------------------------------- Arrival Information Details Patient Name: Schmader, Manisha P. Date of Service: 01/30/2018 1:00 PM Medical Record Number: [id number] Patient Account Number: [id number] Date of Birth/Sex: 12-11-1918 (84 y.o. Female) Treating RN: Montey Hora Primary Care Tiki Tucciarone: Frazier Richards Other Clinician: Referring Marleny Faller: Frazier Richards Treating Adreena Willits/Extender: Cathie Olden in Treatment: 0 Visit Information Patient Arrived: Wheel Chair Arrival Time: 13:10 Accompanied By: nephew Transfer Assistance: Manual Patient Identification Verified: Yes Secondary Verification Process Yes Completed: Patient Has Alerts: Yes Patient Alerts: ABI Bray BILATERAL AVVS 01/17/18 TBI L .55 R .25 Electronic Signature(s) Signed: 01/30/2018 5:08:53 PM By: Montey Hora Entered By: Montey Hora on 01/30/2018 13:35:15 Sakuma, Melda P. ([id number]) -------------------------------------------------------------------------------- Clinic Level of Care Assessment Details Patient Name: Pio, Birgitta P. Date of Service: 01/30/2018 1:00 PM Medical Record Number: [id number] Patient Account Number: [id number] Date of Birth/Sex: Jan 24, 1919 (84 y.o. Female) Treating RN: Roger Shelter Primary Care Jahbari Repinski: Frazier Richards Other Clinician: Referring Malahki Gasaway: Frazier Richards Treating Rue Tinnel/Extender: Cathie Olden in Treatment: 0 Clinic Level of Care Assessment Items TOOL 1 Quantity Score X - Use when EandM and Procedure is performed on INITIAL visit 1 0 ASSESSMENTS - Nursing Assessment / Reassessment X - General Physical Exam (combine w/ comprehensive assessment (listed just below) when 1 20 performed on new pt. evals) X- 1 25 Comprehensive Assessment (HX, ROS, Risk Assessments, Wounds Hx, etc.) ASSESSMENTS - Wound and Skin Assessment / Reassessment []  - Dermatologic / Skin Assessment (not related to wound area) 0 ASSESSMENTS - Ostomy and/or Continence Assessment and Care []  - Incontinence Assessment and Management 0 []  - 0 Ostomy Care Assessment and Management (repouching, etc.) PROCESS - Coordination of Care X - Simple Patient / Family Education for ongoing care 1 15 []  - 0 Complex (extensive) Patient / Family Education for ongoing care []  - 0 Staff obtains Programmer, systems, Records, Test Results / Process Orders []  - 0 Staff telephones HHA, Nursing Homes / Clarify orders / etc []  - 0 Routine Transfer to another Facility (non-emergent condition) []  - 0 Routine Hospital Admission (non-emergent condition) []  - 0 New Admissions / Biomedical engineer / Ordering NPWT, Apligraf, etc. []  - 0 Emergency Hospital Admission (emergent condition) PROCESS - Special Needs []  - Pediatric / Minor Patient Management 0 []  - 0 Isolation Patient Management []  - 0 Hearing / Language / Visual special needs []  - 0 Assessment of Community assistance (transportation, D/C planning, etc.) []  - 0 Additional assistance / Altered mentation []  - 0 Support Surface(s) Assessment (bed, cushion, seat, etc.) Marton, Maanasa P. ([id number]) INTERVENTIONS - Miscellaneous []  - External ear exam 0 []  - 0 Patient Transfer (multiple staff / Civil Service fast streamer / Similar  devices) []  - 0 Simple Staple / Suture removal (25 or less) []  - 0 Complex Staple / Suture removal (26 or more) []  - 0 Hypo/Hyperglycemic Management (do not check if billed separately) X- 1 15 Ankle / Brachial Index (ABI) - do not check if billed separately Has the patient been seen at the hospital within the  last three years: Yes Total Score: 75 Level Of Care: New/Established - Level 2 Electronic Signature(s) Signed: 01/30/2018 2:41:32 PM By: Roger Shelter Entered By: Roger Shelter on 01/30/2018 13:58:08 Dilday, Aneyah P. ([id number]) -------------------------------------------------------------------------------- Encounter Discharge Information Details Patient Name: Legler, Zela P. Date of Service: 01/30/2018 1:00 PM Medical Record Number: [id number] Patient Account Number: [id number] Date of Birth/Sex: 1918-09-21 (84 y.o. Female) Treating RN: Roger Shelter Primary Care Leonor Darnell: Frazier Richards Other Clinician: Referring Siboney Requejo: Frazier Richards Treating Aden Youngman/Extender: Cathie Olden in Treatment: 0 Encounter Discharge Information Items Discharge Condition: Stable Ambulatory Status: Wheelchair Discharge Destination: Home Transportation: Private Auto Accompanied By: nephew Schedule Follow-up Appointment: Yes Clinical Summary of Care: Electronic Signature(s) Signed: 01/30/2018 2:41:32 PM By: Roger Shelter Entered By: Roger Shelter on 01/30/2018 14:09:06 Cheatwood, Yasuko P. ([id number]) -------------------------------------------------------------------------------- Lower Extremity Assessment Details Patient Name: Gavidia, Aminata P. Date of Service: 01/30/2018 1:00 PM Medical Record Number: [id number] Patient Account Number: [id number] Date of Birth/Sex: 1919-05-12 (84 y.o. Female) Treating RN: Montey Hora Primary Care Eiley Mcginnity: Frazier Richards Other Clinician: Referring Hadlea Furuya: Frazier Richards Treating Nykayla Marcelli/Extender: Cathie Olden in  Treatment: 0 Vascular Assessment Pulses: Dorsalis Pedis Palpable: [Left:Yes] [Right:No] Doppler Audible: [Left:Yes] [Right:Yes] Posterior Tibial Palpable: [Left:Yes] [Right:No] Doppler Audible: [Left:Yes] [Right:Yes] Extremity colors, hair growth, and conditions: Extremity Color: [Left:Hyperpigmented] [Right:Hyperpigmented] Hair Growth on Extremity: [Left:Yes] [Right:No] Temperature of Extremity: [Left:Cool] [Right:Warm] Capillary Refill: [Left:< 3 seconds] [Right:< 3 seconds] Toe Nail Assessment Left: Right: Thick: Yes Yes Discolored: Yes Yes Deformed: No No Improper Length and Hygiene: No No Electronic Signature(s) Signed: 01/30/2018 5:08:53 PM By: Montey Hora Entered By: Montey Hora on 01/30/2018 13:36:33 Meara, Detria P. ([id number]) -------------------------------------------------------------------------------- Multi Wound Chart Details Patient Name: Gumz, Yalitza P. Date of Service: 01/30/2018 1:00 PM Medical Record Number: [id number] Patient Account Number: [id number] Date of Birth/Sex: Jun 30, 1918 (84 y.o. Female) Treating RN: Roger Shelter Primary Care Aza Dantes: Frazier Richards Other Clinician: Referring Ravi Tuccillo: Frazier Richards Treating Tiberius Loftus/Extender: Cathie Olden in Treatment: 0 Vital Signs Height(in): Pulse(bpm): 76 Weight(lbs): Blood Pressure(mmHg): 186/66 Body Mass Index(BMI): Temperature(F): 98.3 Respiratory Rate 18 (breaths/min): Photos: [1:No Photos] [2:No Photos] [N/A:N/A] Wound Location: [1:Right Toe Third] [2:Left Lower Leg - Posterior] [N/A:N/A] Wounding Event: [1:Not Known] [2:Not Known] [N/A:N/A] Primary Etiology: [1:Diabetic Wound/Ulcer of the Lower Extremity] [2:Diabetic Wound/Ulcer of the Lower Extremity] [N/A:N/A] Secondary Etiology: [1:N/A] [2:Arterial Insufficiency Ulcer] [N/A:N/A] Comorbid History: [1:Hypertension, Peripheral Arterial Disease, Type II Diabetes, End Stage Renal Disease] [2:Hypertension, Peripheral  Arterial Disease, Type II Diabetes, End Stage Renal Disease] [N/A:N/A] Date Acquired: [1:10/28/2017] [2:10/28/2017] [N/A:N/A] Weeks of Treatment: [1:0] [2:0] [N/A:N/A] Wound Status: [1:Open] [2:Open] [N/A:N/A] Pending Amputation on [1:Yes] [2:Yes] [N/A:N/A] Presentation: Measurements L x W x D [1:0.1x0.1x0.1] [2:3x2.8x0.2] [N/A:N/A] (cm) Area (cm) : [1:0.008] [2:6.597] [N/A:N/A] Volume (cm) : [1:0.001] [2:1.319] [N/A:N/A] % Reduction in Area: [1:99.40%] [2:N/A] [N/A:N/A] % Reduction in Volume: [1:99.80%] [2:N/A] [N/A:N/A] Classification: [1:Grade 2] [2:Grade 1] [N/A:N/A] Exudate Amount: [1:Medium] [2:Medium] [N/A:N/A] Exudate Type: [1:Serous] [2:Serous] [N/A:N/A] Exudate Color: [1:amber] [2:amber] [N/A:N/A] Wound Margin: [1:Flat and Intact] [2:Flat and Intact] [N/A:N/A] Granulation Amount: [1:Small (1-33%)] [2:Medium (34-66%)] [N/A:N/A] Granulation Quality: [1:Pink] [2:Red, Hyper-granulation] [N/A:N/A] Necrotic Amount: [1:Large (67-100%)] [2:Medium (34-66%)] [N/A:N/A] Necrotic Tissue: [1:Eschar, Adherent Slough] [2:Adherent Slough] [N/A:N/A] Exposed Structures: [1:Fascia: No Fat Layer (Subcutaneous Tissue) Exposed: No Tendon: No Muscle: No Joint: No Bone: No] [2:Fat Layer (Subcutaneous Tissue) Exposed: Yes Fascia: No Tendon: No Muscle: No Joint: No Bone: No] [N/A:N/A] Epithelialization: None None N/A Periwound Skin Texture: Excoriation: No Excoriation: No N/A Induration: No Induration: No Callus: No Callus: No Crepitus: No Crepitus: No  Rash: No Rash: No Scarring: No Scarring: No Periwound Skin Moisture: Maceration: No Maceration: No N/A Dry/Scaly: No Dry/Scaly: No Periwound Skin Color: Erythema: Yes Atrophie Blanche: No N/A Atrophie Blanche: No Cyanosis: No Cyanosis: No Ecchymosis: No Ecchymosis: No Erythema: No Hemosiderin Staining: No Hemosiderin Staining: No Mottled: No Mottled: No Pallor: No Pallor: No Rubor: No Rubor: No Erythema Location:  Circumferential N/A N/A Temperature: No Abnormality No Abnormality N/A Tenderness on Palpation: Yes Yes N/A Wound Preparation: Ulcer Cleansing: Ulcer Cleansing: N/A Rinsed/Irrigated with Saline Rinsed/Irrigated with Saline Topical Anesthetic Applied: Topical Anesthetic Applied: Other: lidocaine 4% Other: lidocaine 4% Treatment Notes Electronic Signature(s) Signed: 01/30/2018 2:41:32 PM By: Roger Shelter Entered By: Roger Shelter on 01/30/2018 13:49:58 Khiev, Sarie P. ([id number]) -------------------------------------------------------------------------------- Ferdinand Details Patient Name: Cremer, Laylia P. Date of Service: 01/30/2018 1:00 PM Medical Record Number: [id number] Patient Account Number: [id number] Date of Birth/Sex: [birth year] (84 y.o. Female) Treating RN: Roger Shelter Primary Care Emonee Winkowski: Frazier Richards Other Clinician: Referring Jshawn Hurta: Frazier Richards Treating Dyer Klug/Extender: Cathie Olden in Treatment: 0 Active Inactive ` Orientation to the Wound Care Program Nursing Diagnoses: Knowledge deficit related to the wound healing center program Goals: Patient/caregiver will verbalize understanding of the [facility] Program Date Initiated: 01/30/2018 Target Resolution Date: 02/20/2018 Goal Status: Active Interventions: Provide education on orientation to the wound center Notes: ` Wound/Skin Impairment Nursing Diagnoses: Impaired tissue integrity Goals: Patient/caregiver will verbalize understanding of skin care regimen Date Initiated: 01/30/2018 Target Resolution Date: 02/19/2018 Goal Status: Active Ulcer/skin breakdown will have a volume reduction of 30% by week 4 Date Initiated: 01/30/2018 Target Resolution Date: 02/19/2018 Goal Status: Active Interventions: Assess patient/caregiver ability to obtain necessary supplies Assess patient/caregiver ability to perform ulcer/skin care regimen upon admission and as  needed Assess ulceration(s) every visit Treatment Activities: Skin care regimen initiated : 01/30/2018 Notes: Electronic Signature(s) Signed: 01/30/2018 2:41:32 PM By: Chauncey Reading, Tomekia P. ([id number]) Entered By: Roger Shelter on 01/30/2018 13:49:40 Vandervelden, Natashia P. ([id number]) -------------------------------------------------------------------------------- Pain Assessment Details Patient Name: Choi, Katianna P. Date of Service: 01/30/2018 1:00 PM Medical Record Number: [id number] Patient Account Number: [id number] Date of Birth/Sex: 08/26/1918 (84 y.o. Female) Treating RN: Montey Hora Primary Care Kairah Leoni: Frazier Richards Other Clinician: Referring Gerre Ranum: Frazier Richards Treating Madesyn Ast/Extender: Cathie Olden in Treatment: 0 Active Problems Location of Pain Severity and Description of Pain Patient Has Paino No Site Locations Pain Management and Medication Current Pain Management: Electronic Signature(s) Signed: 01/30/2018 5:08:53 PM By: Montey Hora Entered By: Montey Hora on 01/30/2018 13:13:00 Idleman, Corinn P. ([id number]) -------------------------------------------------------------------------------- Patient/Caregiver Education Details Patient Name: Pottinger, Nysha P. Date of Service: 01/30/2018 1:00 PM Medical Record Number: [id number] Patient Account Number: [id number] Date of Birth/Gender: 1919-02-14 (84 y.o. Female) Treating RN: Roger Shelter Primary Care Physician: Frazier Richards Other Clinician: Referring Physician: Frazier Richards Treating Physician/Extender: Cathie Olden in Treatment: 0 Education Assessment Education Provided To: Patient Education Topics Provided Wound/Skin Impairment: Handouts: Caring for Your Ulcer, Other: orders sent with patient Methods: Demonstration, Explain/Verbal Responses: State content correctly Electronic Signature(s) Signed: 01/30/2018 2:41:32 PM By: Roger Shelter Entered By: Roger Shelter on 01/30/2018 14:09:31 Cienfuegos, Denese P. ([id number]) -------------------------------------------------------------------------------- Wound Assessment Details Patient Name: Weikel, Cyan P. Date of Service: 01/30/2018 1:00 PM Medical Record Number: [id number] Patient Account Number: [id number] Date of Birth/Sex: August 04, 1918 (84 y.o. Female) Treating RN: Roger Shelter Primary Care Saige Busby: Frazier Richards Other Clinician: Referring Quinci Gavidia: Frazier Richards Treating Sofija Antwi/Extender: Cathie Olden in Treatment: 0 Wound Status Wound Number: 1 Primary Diabetic Wound/Ulcer of the Lower  Extremity Etiology: Wound Location: Right Toe Third Wound Open Wounding Event: Not Known Status: Date Acquired: 10/28/2017 Comorbid Hypertension, Peripheral Arterial Disease, Weeks Of Treatment: 0 History: Type II Diabetes, End Stage Renal Disease Clustered Wound: No Pending Amputation On Presentation Photos Photo Uploaded By: Montey Hora on 01/30/2018 14:15:00 Wound Measurements Length: (cm) 0.1 Width: (cm) 0.1 Depth: (cm) 0.1 Area: (cm) 0.008 Volume: (cm) 0.001 % Reduction in Area: 99.4% % Reduction in Volume: 99.8% Epithelialization: None Tunneling: No Undermining: No Wound Description Classification: Grade 2 Wound Margin: Flat and Intact Exudate Amount: Medium Exudate Type: Serous Exudate Color: amber Foul Odor After Cleansing: No Slough/Fibrino Yes Wound Bed Granulation Amount: Small (1-33%) Exposed Structure Granulation Quality: Pink Fascia Exposed: No Necrotic Amount: Large (67-100%) Fat Layer (Subcutaneous Tissue) Exposed: No Necrotic Quality: Eschar, Adherent Slough Tendon Exposed: No Muscle Exposed: No Joint Exposed: No Bone Exposed: No Periwound Skin Texture Galant, Juni P. ([id number]) Texture Color No Abnormalities Noted: No No Abnormalities Noted: No Callus: No Atrophie Blanche: No Crepitus: No Cyanosis: No Excoriation: No Ecchymosis:  No Induration: No Erythema: Yes Rash: No Erythema Location: Circumferential Scarring: No Hemosiderin Staining: No Mottled: No Moisture Pallor: No No Abnormalities Noted: No Rubor: No Dry / Scaly: No Maceration: No Temperature / Pain Temperature: No Abnormality Tenderness on Palpation: Yes Wound Preparation Ulcer Cleansing: Rinsed/Irrigated with Saline Topical Anesthetic Applied: Other: lidocaine 4%, Treatment Notes Wound #1 (Right Toe Third) 4. Dressing Applied: Santyl Ointment 5. Secondary Dressing Applied Gauze and Kerlix/Conform Electronic Signature(s) Signed: 01/30/2018 2:41:32 PM By: Roger Shelter Entered By: Roger Shelter on 01/30/2018 13:48:56 Tiller, Lorelei P. ([id number]) -------------------------------------------------------------------------------- Wound Assessment Details Patient Name: Funez, Ethel P. Date of Service: 01/30/2018 1:00 PM Medical Record Number: [id number] Patient Account Number: [id number] Date of Birth/Sex: 1919-01-13 (84 y.o. Female) Treating RN: Montey Hora Primary Care Jakarri Lesko: Frazier Richards Other Clinician: Referring Kanishk Stroebel: Frazier Richards Treating Reinaldo Helt/Extender: Cathie Olden in Treatment: 0 Wound Status Wound Number: 2 Primary Diabetic Wound/Ulcer of the Lower Extremity Etiology: Wound Location: Left Lower Leg - Posterior Secondary Arterial Insufficiency Ulcer Wounding Event: Not Known Etiology: Date Acquired: 10/28/2017 Wound Status: Open Weeks Of Treatment: 0 Comorbid Hypertension, Peripheral Arterial Disease, Clustered Wound: No History: Type II Diabetes, End Stage Renal Disease Pending Amputation On Presentation Photos Photo Uploaded By: Montey Hora on 01/30/2018 14:15:01 Wound Measurements Length: (cm) 3 Width: (cm) 2.8 Depth: (cm) 0.2 Area: (cm) 6.597 Volume: (cm) 1.319 % Reduction in Area: % Reduction in Volume: Epithelialization: None Tunneling: No Undermining: No Wound  Description Classification: Grade 1 Wound Margin: Flat and Intact Exudate Amount: Medium Exudate Type: Serous Exudate Color: amber Foul Odor After Cleansing: No Slough/Fibrino Yes Wound Bed Granulation Amount: Medium (34-66%) Exposed Structure Granulation Quality: Red, Hyper-granulation Fascia Exposed: No Necrotic Amount: Medium (34-66%) Fat Layer (Subcutaneous Tissue) Exposed: Yes Necrotic Quality: Adherent Slough Tendon Exposed: No Muscle Exposed: No Joint Exposed: No Bone Exposed: No Periwound Skin Texture Klapper, Idabelle P. ([id number]) Texture Color No Abnormalities Noted: No No Abnormalities Noted: No Callus: No Atrophie Blanche: No Crepitus: No Cyanosis: No Excoriation: No Ecchymosis: No Induration: No Erythema: No Rash: No Hemosiderin Staining: No Scarring: No Mottled: No Pallor: No Moisture Rubor: No No Abnormalities Noted: No Dry / Scaly: No Temperature / Pain Maceration: No Temperature: No Abnormality Tenderness on Palpation: Yes Wound Preparation Ulcer Cleansing: Rinsed/Irrigated with Saline Topical Anesthetic Applied: Other: lidocaine 4%, Electronic Signature(s) Signed: 01/30/2018 5:08:53 PM By: Montey Hora Entered By: Montey Hora on 01/30/2018 13:34:22 Mikelson, Kashlyn P. ([id number]) -------------------------------------------------------------------------------- Vitals Details Patient Name:  Hislop, Rochella P. Date of Service: 01/30/2018 1:00 PM Medical Record Number: [id number] Patient Account Number: [id number] Date of Birth/Sex: 04-01-1919 (84 y.o. Female) Treating RN: Montey Hora Primary Care Kirstein Baxley: Frazier Richards Other Clinician: Referring Mennie Spiller: Frazier Richards Treating Hermenegildo Clausen/Extender: Cathie Olden in Treatment: 0 Vital Signs Time Taken: 13:10 Temperature (F): 98.3 Pulse (bpm): 73 Respiratory Rate (breaths/min): 18 Blood Pressure (mmHg): 186/66 Reference Range: 80 - 120 mg / dl Electronic Signature(s) Signed:  01/30/2018 5:08:53 PM By: Montey Hora Entered By: Montey Hora on 01/30/2018 13:13:29

## 2018-02-02 NOTE — Progress Notes (Signed)
Ashley [name], Ashley [name] ([id number]) Visit Report for 01/30/2018 Chief Complaint Document Details Patient Name: Ashley [name], [name] Date of Service: 01/30/2018 1:00 PM Medical Record Number: [id number] Patient Account Number: [id number] Date of Birth/Sex: 15-Feb-1919 (84 y.o. Female) Treating RN: Roger Shelter Primary Care Provider: Frazier Richards Other Clinician: Referring Provider: Frazier Richards Treating Provider/Extender: Cathie Olden in Treatment: 0 Information Obtained from: Patient Chief Complaint Right posterior calf Electronic Signature(s) Signed: 01/30/2018 2:35:58 PM By: Lawanda Cousins Entered By: Lawanda Cousins on 01/30/2018 14:35:58 Ashley [name], [name] ([id number]) -------------------------------------------------------------------------------- Debridement Details Patient Name: [name], [name] Date of Service: 01/30/2018 1:00 PM Medical Record Number: [id number] Patient Account Number: [id number] Date of Birth/Sex: [birth year] (84 y.o. Female) Treating RN: Roger Shelter Primary Care Provider: Frazier Richards Other Clinician: Referring Provider: Frazier Richards Treating Provider/Extender: Cathie Olden in Treatment: 0 Debridement Performed for Wound #2 Right,Posterior Lower Leg Assessment: Performed By: Physician Lawanda Cousins, NP Debridement Type: Debridement Severity of Tissue Pre Fat layer exposed Debridement: Pre-procedure Verification/Time Yes - 13:50 Out Taken: Start Time: 13:50 Pain Control: Other : lidocaine 4% Total Area Debrided (L x W): 3 (cm) x 2.8 (cm) = 8.4 (cm) Tissue and other material Viable, Non-Viable, Slough, Subcutaneous, Biofilm, Slough debrided: Level: Skin/Subcutaneous Tissue Debridement Description: Excisional Instrument: Curette Bleeding: Minimum Hemostasis Achieved: Pressure End Time: 13:51 Procedural Pain: 0 Post Procedural Pain: 0 Response to Treatment: Procedure was tolerated well Level of Consciousness: Awake and  Alert Post Debridement Measurements of Total Wound Length: (cm) 3 Width: (cm) 2.8 Depth: (cm) 0.2 Volume: (cm) 1.319 Character of Wound/Ulcer Post Debridement: Stable Severity of Tissue Post Debridement: Fat layer exposed Post Procedure Diagnosis Same as Pre-procedure Electronic Signature(s) Signed: 01/30/2018 2:41:32 PM By: Roger Shelter Signed: 01/30/2018 6:16:13 PM By: Lawanda Cousins Entered By: Roger Shelter on 01/30/2018 13:51:22 Ashley [name], [name] ([id number]) -------------------------------------------------------------------------------- HPI Details Patient Name: [name], [name] Date of Service: 01/30/2018 1:00 PM Medical Record Number: [id number] Patient Account Number: [id number] Date of Birth/Sex: Sep 02, 1918 (84 y.o. Female) Treating RN: Roger Shelter Primary Care Provider: Frazier Richards Other Clinician: Referring Provider: Frazier Richards Treating Provider/Extender: Cathie Olden in Treatment: 0 History of Present Illness HPI Description: 01/30/18-She is seen in initial evaluation for a right plantar calf ulcer. She is being treated for both the calf ulcer and a right second toe ulcer; the right second toe is planned for amputation on 9/19. The facility has been treating both ulcers with santyl. On 8/12 she underwent angiogram of the right lower extremity with angioplasty to the posterior tibial, right mid distal SFA, popliteal artery with stent placement to the right popliteal artery; post-procedure ABI-non-compressible bilaterally, RIGHT TBI 0.25, LEFT TBI 0.55. The right posterior calf ulcer is healthy with minimal amount of non-viable tissue remaining. We will continue with santyl, with expectation to change treatment plan next week. She is a diabetic with last A1c 7.6 (01/2018) Electronic Signature(s) Signed: 01/30/2018 2:31:20 PM By: Lawanda Cousins Previous Signature: 01/30/2018 2:30:56 PM Version By: Lawanda Cousins Previous Signature: 01/30/2018 2:05:22 PM  Version By: Lawanda Cousins Entered By: Lawanda Cousins on 01/30/2018 14:31:20 Ashley [name], [name] ([id number]) -------------------------------------------------------------------------------- Physical Exam Details Patient Name: Ashley [name], [name] Date of Service: 01/30/2018 1:00 PM Medical Record Number: [id number] Patient Account Number: [id number] Date of Birth/Sex: [birth year] (84 y.o. Female) Treating RN: Roger Shelter Primary Care Provider: Frazier Richards Other Clinician: Referring Provider: Frazier Richards Treating Provider/Extender: Cathie Olden in Treatment: 0 Respiratory respirations are even and unlabored. clear throughout. Cardiovascular s1 s2 regular rate and rhythm. RLE- palpable DP,  PT. RLE- trace edema. Musculoskeletal ambulates with walker assistance. Psychiatric cognitive impairment. Electronic Signature(s) Signed: 01/30/2018 2:32:18 PM By: Lawanda Cousins Entered By: Lawanda Cousins on 01/30/2018 14:32:18 Ashley [name], [name] ([id number]) -------------------------------------------------------------------------------- Physician Orders Details Patient Name: Ashley [name], [name] Date of Service: 01/30/2018 1:00 PM Medical Record Number: [id number] Patient Account Number: [id number] Date of Birth/Sex: 1918-07-10 (84 y.o. Female) Treating RN: Roger Shelter Primary Care Provider: Frazier Richards Other Clinician: Referring Provider: Frazier Richards Treating Provider/Extender: Cathie Olden in Treatment: 0 Verbal / Phone Orders: No Diagnosis Coding ICD-10 Coding Code Description E11.622 Type 2 diabetes mellitus with other skin ulcer I70.232 Atherosclerosis of native arteries of right leg with ulceration of calf L97.212 Non-pressure chronic ulcer of right calf with fat layer exposed Wound Cleansing Wound #1 Right Toe Third o Clean wound with Normal Saline. Wound #2 Right,Posterior Lower Leg o Clean wound with Normal Saline. Anesthetic (add to Medication  List) Wound #1 Right Toe Third o Topical Lidocaine 4% cream applied to wound bed prior to debridement (In Clinic Only). Wound #2 Right,Posterior Lower Leg o Topical Lidocaine 4% cream applied to wound bed prior to debridement (In Clinic Only). Primary Wound Dressing Wound #1 Right Toe Third o Santyl Ointment o Other: - saline gauze over santyl Wound #2 Right,Posterior Lower Leg o Santyl Ointment o Other: - saline gauze over santyl Secondary Dressing Wound #1 Right Toe Third o Dry Gauze - over saline gauze o Conform/Kerlix - with ace wrap Wound #2 Right,Posterior Lower Leg o Dry Gauze - over saline gauze o Conform/Kerlix - with ace wrap Dressing Change Frequency Wound #1 Right Toe Third o Change dressing every day. Wound #2 Right,Posterior Lower Leg Ashley [name], [name] ([id number]) o Change dressing every day. Follow-up Appointments Wound #1 Right Toe Third o Return Appointment in 1 week. Wound #2 Right,Posterior Lower Leg o Return Appointment in 1 week. Home Health Wound #1 Right Toe [place] Nurse may visit PRN to address patientos wound care needs. o FACE TO FACE ENCOUNTER: MEDICARE and MEDICAID PATIENTS: I certify that this patient is under my care and that I had a face-to-face encounter that meets the physician face-to-face encounter requirements with this patient on this date. The encounter with the patient was in whole or in part for the following MEDICAL CONDITION: (primary reason for [place]) MEDICAL NECESSITY: I certify, that based on my findings, NURSING services are a medically necessary home health service. HOME BOUND STATUS: I certify that my clinical findings support that this patient is homebound (i.e., Due to illness or injury, pt requires aid of supportive devices such as crutches, cane, wheelchairs, walkers, the use of special transportation or the assistance of another person to  leave their place of residence. There is a normal inability to leave the home and doing so requires considerable and taxing effort. Other absences are for medical reasons / religious services and are infrequent or of short duration when for other reasons). o If current dressing causes regression in wound condition, may D/C ordered dressing product/s and apply Normal Saline Moist Dressing daily until next [place] / Other MD appointment. [place] of regression in wound condition at [phone]. o Please direct any NON-WOUND related issues/requests for orders to patient's Primary Care Physician Wound #2 [place] Nurse may visit PRN to address patientos wound care needs. o FACE TO FACE ENCOUNTER: MEDICARE and MEDICAID PATIENTS: I certify that  this patient is under my care and that I had a face-to-face encounter that meets the physician face-to-face encounter requirements with this patient on this date. The encounter with the patient was in whole or in part for the following MEDICAL CONDITION: (primary reason for [place]) MEDICAL NECESSITY: I certify, that based on my findings, NURSING services are a medically necessary home health service. HOME BOUND STATUS: I certify that my clinical findings support that this patient is homebound (i.e., Due to illness or injury, pt requires aid of supportive devices such as crutches, cane, wheelchairs, walkers, the use of special transportation or the assistance of another person to leave their place of residence. There is a normal inability to leave the home and doing so requires considerable and taxing effort. Other absences are for medical reasons / religious services and are infrequent or of short duration when for other reasons). o If current dressing causes regression in wound condition, may D/C ordered dressing product/s and apply Normal Saline  Moist Dressing daily until next [place] / Other MD appointment. [place] of regression in wound condition at [phone]. o Please direct any NON-WOUND related issues/requests for orders to patient's Primary Care Physician Electronic Signature(s) Signed: 01/30/2018 6:16:13 PM By: Lawanda Cousins Entered By: Lawanda Cousins on 01/30/2018 14:33:50 Steffey, Gerard P. ([id number]) -------------------------------------------------------------------------------- Problem List Details Patient Name: Ashley [name], [name] Date of Service: 01/30/2018 1:00 PM Medical Record Number: [id number] Patient Account Number: [id number] Date of Birth/Sex: 1918/11/05 (84 y.o. Female) Treating RN: Roger Shelter Primary Care Provider: Frazier Richards Other Clinician: Referring Provider: Frazier Richards Treating Provider/Extender: Cathie Olden in Treatment: 0 Active Problems ICD-10 Evaluated Encounter Code Description Active Date Today Diagnosis E11.622 Type 2 diabetes mellitus with other skin ulcer 01/30/2018 No Yes I70.232 Atherosclerosis of native arteries of right leg with ulceration of 01/30/2018 No Yes calf L97.212 Non-pressure chronic ulcer of right calf with fat layer exposed 01/30/2018 No Yes Inactive Problems Resolved Problems Electronic Signature(s) Signed: 01/30/2018 2:33:33 PM By: Lawanda Cousins Previous Signature: 01/30/2018 1:45:56 PM Version By: Lawanda Cousins Entered By: Lawanda Cousins on 01/30/2018 14:33:33 Ashley [name], [name] ([id number]) -------------------------------------------------------------------------------- Progress Note Details Patient Name: Ashley [name], [name] Date of Service: 01/30/2018 1:00 PM Medical Record Number: [id number] Patient Account Number: [id number] Date of Birth/Sex: 07/30/1918 (84 y.o. Female) Treating RN: Roger Shelter Primary Care Provider: Frazier Richards Other Clinician: Referring Provider: Frazier Richards Treating Provider/Extender:  Cathie Olden in Treatment: 0 Subjective Chief Complaint Information obtained from Patient Right posterior calf History of Present Illness (HPI) 01/30/18-She is seen in initial evaluation for a right plantar calf ulcer. She is being treated for both the calf ulcer and a right second toe ulcer; the right second toe is planned for amputation on 9/19. The facility has been treating both ulcers with santyl. On 8/12 she underwent angiogram of the right lower extremity with angioplasty to the posterior tibial, right mid distal SFA, popliteal artery with stent placement to the right popliteal artery; post-procedure ABI-non-compressible bilaterally, RIGHT TBI 0.25, LEFT TBI 0.55. The right posterior calf ulcer is healthy with minimal amount of non-viable tissue remaining. We will continue with santyl, with expectation to change treatment plan next week. She is a diabetic with last A1c 7.6 (01/2018) Wound History Patient presents with 2 open wounds that have been present for approximately 3 months. Patient has been treating wounds in the following manner: santyl. Laboratory tests have not been performed in the last month. Patient reportedly has not tested positive for an  antibiotic resistant organism. Patient reportedly has not tested positive for osteomyelitis. Patient reportedly has had testing performed to evaluate circulation in the legs. Patient History Information obtained from Patient, Caregiver. Allergies No Known Allergies Social History Never smoker, Marital Status - Widowed, Alcohol Use - Never, Drug Use - No History, Caffeine Use - Moderate. Medical History Eyes Denies history of Cataracts, Glaucoma, Optic Neuritis Ear/Nose/Mouth/Throat Denies history of Chronic sinus problems/congestion, Middle ear problems Hematologic/Lymphatic Denies history of Anemia, Hemophilia, Human Immunodeficiency Virus, Lymphedema, Sickle Cell Disease Respiratory Denies history of Aspiration, Asthma,  Chronic Obstructive Pulmonary Disease (COPD), Pneumothorax, Sleep Apnea, Tuberculosis Cardiovascular Patient has history of Hypertension, Peripheral Arterial Disease Denies history of Angina, Arrhythmia, Congestive Heart Failure, Coronary Artery Disease, Deep Vein Thrombosis, Hypotension, Myocardial Infarction, Peripheral Venous Disease, Phlebitis, Vasculitis Gastrointestinal Denies history of Cirrhosis , Colitis, Crohn s, Hepatitis A, Hepatitis B, Hepatitis C Nou, Rosalva P. ([id number]) Endocrine Patient has history of Type II Diabetes Denies history of Type I Diabetes Genitourinary Patient has history of End Stage Renal Disease - CKD stage 3 Immunological Denies history of Lupus Erythematosus, Raynaud s, Scleroderma Integumentary (Skin) Denies history of History of Burn, History of pressure wounds Musculoskeletal Denies history of Gout, Rheumatoid Arthritis, Osteoarthritis, Osteomyelitis Neurologic Denies history of Dementia, Neuropathy, Quadriplegia, Paraplegia, Seizure Disorder Oncologic Denies history of Received Chemotherapy, Received Radiation Psychiatric Denies history of Anorexia/bulimia, Confinement Anxiety Medical And Surgical History Notes Ear/Nose/Mouth/Throat HOH Cardiovascular stent placed in right leg 3 weeks ago Review of Systems (ROS) Constitutional Symptoms (General Health) Denies complaints or symptoms of Fatigue, Fever, Chills, Marked Weight Change. Eyes Complains or has symptoms of Glasses / Contacts - glasses. Denies complaints or symptoms of Dry Eyes, Vision Changes. Ear/Nose/Mouth/Throat Denies complaints or symptoms of Difficult clearing ears, Sinusitis. Hematologic/Lymphatic Denies complaints or symptoms of Bleeding / Clotting Disorders, Human Immunodeficiency Virus. Respiratory Denies complaints or symptoms of Chronic or frequent coughs, Shortness of Breath. Cardiovascular Denies complaints or symptoms of Chest pain, LE  edema. Gastrointestinal Denies complaints or symptoms of Frequent diarrhea, Nausea, Vomiting. Endocrine Denies complaints or symptoms of Hepatitis, Thyroid disease, Polydypsia (Excessive Thirst). Genitourinary Complains or has symptoms of Kidney failure/ Dialysis - CKD stage 3. Immunological Denies complaints or symptoms of Hives, Itching. Integumentary (Skin) Complains or has symptoms of Wounds. Denies complaints or symptoms of Bleeding or bruising tendency, Breakdown, Swelling. Musculoskeletal Denies complaints or symptoms of Muscle Pain, Muscle Weakness. Neurologic Denies complaints or symptoms of Numbness/parasthesias, Focal/Weakness. Psychiatric Denies complaints or symptoms of Anxiety, Claustrophobia. General Notes: patient and POA are unaware of family medical history Ashley [name], [name] ([id number]) Objective Constitutional Vitals Time Taken: 1:10 PM, Temperature: 98.3 F, Pulse: 73 bpm, Respiratory Rate: 18 breaths/min, Blood Pressure: 186/66 mmHg. Respiratory respirations are even and unlabored. clear throughout. Cardiovascular s1 s2 regular rate and rhythm. RLE- palpable DP, PT. RLE- trace edema. Musculoskeletal ambulates with walker assistance. Psychiatric cognitive impairment. Integumentary (Hair, Skin) Wound #1 status is Open. Original cause of wound was Not Known. The wound is located on the Right Toe Third. The wound measures 0.1cm length x 0.1cm width x 0.1cm depth; 0.008cm^2 area and 0.001cm^3 volume. There is no tunneling or undermining noted. There is a medium amount of serous drainage noted. The wound margin is flat and intact. There is small (1-33%) pink granulation within the wound bed. There is a large (67-100%) amount of necrotic tissue within the wound bed including Eschar and Adherent Slough. The periwound skin appearance exhibited: Erythema. The periwound skin appearance did not exhibit: Callus, Crepitus, Excoriation, Induration,  Rash, Scarring, Dry/Scaly,  Maceration, Atrophie Blanche, Cyanosis, Ecchymosis, Hemosiderin Staining, Mottled, Pallor, Rubor. The surrounding wound skin color is noted with erythema which is circumferential. Periwound temperature was noted as No Abnormality. The periwound has tenderness on palpation. Wound #2 status is Open. Original cause of wound was Not Known. The wound is located on the Right,Posterior Lower Leg. The wound measures 3cm length x 2.8cm width x 0.2cm depth; 6.597cm^2 area and 1.319cm^3 volume. There is Fat Layer (Subcutaneous Tissue) Exposed exposed. There is no tunneling or undermining noted. There is a medium amount of serous drainage noted. The wound margin is flat and intact. There is medium (34-66%) red, hyper - granulation within the wound bed. There is a medium (34-66%) amount of necrotic tissue within the wound bed including Adherent Slough. The periwound skin appearance did not exhibit: Callus, Crepitus, Excoriation, Induration, Rash, Scarring, Dry/Scaly, Maceration, Atrophie Blanche, Cyanosis, Ecchymosis, Hemosiderin Staining, Mottled, Pallor, Rubor, Erythema. Periwound temperature was noted as No Abnormality. The periwound has tenderness on palpation. Assessment Active Problems ICD-10 Type 2 diabetes mellitus with other skin ulcer Atherosclerosis of native arteries of right leg with ulceration of calf Non-pressure chronic ulcer of right calf with fat layer exposed [place], Ashley [name] P. ([id number]) Procedures Wound #2 Pre-procedure diagnosis of Wound #2 is a Diabetic Wound/Ulcer of the Lower Extremity located on the Right,Posterior Lower Leg .Severity of Tissue Pre Debridement is: Fat layer exposed. There was a Excisional Skin/Subcutaneous Tissue Debridement with a total area of 8.4 sq cm performed by Lawanda Cousins, NP. With the following instrument(s): Curette to remove Viable and Non-Viable tissue/material. Material removed includes Subcutaneous Tissue, Slough, and Biofilm after achieving pain  control using Other (lidocaine 4%). No specimens were taken. A time out was conducted at 13:50, prior to the start of the procedure. A Minimum amount of bleeding was controlled with Pressure. The procedure was tolerated well with a pain level of 0 throughout and a pain level of 0 following the procedure. Patient s Level of Consciousness post procedure was recorded as Awake and Alert. Post Debridement Measurements: 3cm length x 2.8cm width x 0.2cm depth; 1.319cm^3 volume. Character of Wound/Ulcer Post Debridement is stable. Severity of Tissue Post Debridement is: Fat layer exposed. Post procedure Diagnosis Wound #2: Same as Pre-Procedure Plan Wound Cleansing: Wound #1 Right Toe Third: Clean wound with Normal Saline. Wound #2 Right,Posterior Lower Leg: Clean wound with Normal Saline. Anesthetic (add to Medication List): Wound #1 Right Toe Third: Topical Lidocaine 4% cream applied to wound bed prior to debridement (In Clinic Only). Wound #2 Right,Posterior Lower Leg: Topical Lidocaine 4% cream applied to wound bed prior to debridement (In Clinic Only). Primary Wound Dressing: Wound #1 Right Toe Third: Santyl Ointment Other: - saline gauze over santyl Wound #2 Right,Posterior Lower Leg: Santyl Ointment Other: - saline gauze over santyl Secondary Dressing: Wound #1 Right Toe Third: Dry Gauze - over saline gauze Conform/Kerlix - with ace wrap Wound #2 Right,Posterior Lower Leg: Dry Gauze - over saline gauze Conform/Kerlix - with ace wrap Dressing Change Frequency: Wound #1 Right Toe Third: Change dressing every day. Wound #2 Right,Posterior Lower Leg: Change dressing every day. Follow-up Appointments: AIJALON, DEMURO. ([id number]) Wound #1 Right Toe Third: Return Appointment in 1 week. Wound #2 Right,Posterior Lower Leg: Return Appointment in 1 week. Home Health: Wound #1 Right Toe Third: [place] Nurse may visit PRN to address patient s wound care  needs. FACE TO FACE ENCOUNTER: MEDICARE and MEDICAID PATIENTS: I certify that this  patient is under my care and that I had a face-to-face encounter that meets the physician face-to-face encounter requirements with this patient on this date. The encounter with the patient was in whole or in part for the following MEDICAL CONDITION: (primary reason for [place]) MEDICAL NECESSITY: I certify, that based on my findings, NURSING services are a medically necessary home health service. HOME BOUND STATUS: I certify that my clinical findings support that this patient is homebound (i.e., Due to illness or injury, pt requires aid of supportive devices such as crutches, cane, wheelchairs, walkers, the use of special transportation or the assistance of another person to leave their place of residence. There is a normal inability to leave the home and doing so requires considerable and taxing effort. Other absences are for medical reasons / religious services and are infrequent or of short duration when for other reasons). If current dressing causes regression in wound condition, may D/C ordered dressing product/s and apply Normal Saline Moist Dressing daily until next [place] / Other MD appointment. [place] of regression in wound condition at [phone]. Please direct any NON-WOUND related issues/requests for orders to patient's Primary Care Physician Wound #2 Right,Posterior Lower Leg: [place] Nurse may visit PRN to address patient s wound care needs. FACE TO FACE ENCOUNTER: MEDICARE and MEDICAID PATIENTS: I certify that this patient is under my care and that I had a face-to-face encounter that meets the physician face-to-face encounter requirements with this patient on this date. The encounter with the patient was in whole or in part for the following MEDICAL CONDITION: (primary reason for [place]) MEDICAL NECESSITY: I  certify, that based on my findings, NURSING services are a medically necessary home health service. HOME BOUND STATUS: I certify that my clinical findings support that this patient is homebound (i.e., Due to illness or injury, pt requires aid of supportive devices such as crutches, cane, wheelchairs, walkers, the use of special transportation or the assistance of another person to leave their place of residence. There is a normal inability to leave the home and doing so requires considerable and taxing effort. Other absences are for medical reasons / religious services and are infrequent or of short duration when for other reasons). If current dressing causes regression in wound condition, may D/C ordered dressing product/s and apply Normal Saline Moist Dressing daily until next [place] / Other MD appointment. [place] of regression in wound condition at [phone]. Please direct any NON-WOUND related issues/requests for orders to patient's Primary Care Physician Electronic Signature(s) Signed: 01/30/2018 2:36:13 PM By: Lawanda Cousins Previous Signature: 01/30/2018 2:34:03 PM Version By: Lawanda Cousins Previous Signature: 01/30/2018 2:32:29 PM Version By: Lawanda Cousins Entered By: Lawanda Cousins on 01/30/2018 14:36:13 Fraleigh, Kyanna P. ([id number]) -------------------------------------------------------------------------------- ROS/PFSH Details Patient Name: Gipe, Zuzanna P. Date of Service: 01/30/2018 1:00 PM Medical Record Number: [id number] Patient Account Number: [id number] Date of Birth/Sex: [birth year] (84 y.o. Female) Treating RN: Montey Hora Primary Care Provider: Frazier Richards Other Clinician: Referring Provider: Frazier Richards Treating Provider/Extender: Cathie Olden in Treatment: 0 Information Obtained From Patient Caregiver Wound History Do you currently have one or more open woundso Yes How many open wounds do you currently haveo  2 Approximately how long have you had your woundso 3 months How have you been treating your wound(s) until nowo santyl Has your wound(s) ever healed and then re-openedo No Have you had any lab work done in the past [employer]  No Have you tested positive for an antibiotic resistant organism (MRSA, VRE)o No Have you tested positive for osteomyelitis (bone infection)o No Have you had any tests for circulation on your legso Yes Where was the test doneo [facility] Constitutional Symptoms (General Health) Complaints and Symptoms: Negative for: Fatigue; Fever; Chills; Marked Weight Change Eyes Complaints and Symptoms: Positive for: Glasses / Contacts - glasses Negative for: Dry Eyes; Vision Changes Medical History: Negative for: Cataracts; Glaucoma; Optic Neuritis Ear/Nose/Mouth/Throat Complaints and Symptoms: Negative for: Difficult clearing ears; Sinusitis Medical History: Negative for: Chronic sinus problems/congestion; Middle ear problems Past Medical History Notes: HOH Hematologic/Lymphatic Complaints and Symptoms: Negative for: Bleeding / Clotting Disorders; Human Immunodeficiency Virus Medical History: Negative for: Anemia; Hemophilia; Human Immunodeficiency Virus; Lymphedema; Sickle Cell Disease Respiratory Complaints and Symptoms: Negative for: Chronic or frequent coughs; Shortness of Breath Villagomez, Christyne P. ([id number]) Medical History: Negative for: Aspiration; Asthma; Chronic Obstructive Pulmonary Disease (COPD); Pneumothorax; Sleep Apnea; Tuberculosis Cardiovascular Complaints and Symptoms: Negative for: Chest pain; LE edema Medical History: Positive for: Hypertension; Peripheral Arterial Disease Negative for: Angina; Arrhythmia; Congestive Heart Failure; Coronary Artery Disease; Deep Vein Thrombosis; Hypotension; Myocardial Infarction; Peripheral Venous Disease; Phlebitis; Vasculitis Past Medical History Notes: stent placed in right leg 3 weeks ago Gastrointestinal Complaints  and Symptoms: Negative for: Frequent diarrhea; Nausea; Vomiting Medical History: Negative for: Cirrhosis ; Colitis; Crohnos; Hepatitis A; Hepatitis B; Hepatitis C Endocrine Complaints and Symptoms: Negative for: Hepatitis; Thyroid disease; Polydypsia (Excessive Thirst) Medical History: Positive for: Type II Diabetes Negative for: Type I Diabetes Genitourinary Complaints and Symptoms: Positive for: Kidney failure/ Dialysis - CKD stage 3 Medical History: Positive for: End Stage Renal Disease - CKD stage 3 Immunological Complaints and Symptoms: Negative for: Hives; Itching Medical History: Negative for: Lupus Erythematosus; Raynaudos; Scleroderma Integumentary (Skin) Complaints and Symptoms: Positive for: Wounds Negative for: Bleeding or bruising tendency; Breakdown; Swelling Medical History: Negative for: History of Burn; History of pressure wounds Stanwood, Michaela P. ([id number]) Musculoskeletal Complaints and Symptoms: Negative for: Muscle Pain; Muscle Weakness Medical History: Negative for: Gout; Rheumatoid Arthritis; Osteoarthritis; Osteomyelitis Neurologic Complaints and Symptoms: Negative for: Numbness/parasthesias; Focal/Weakness Medical History: Negative for: Dementia; Neuropathy; Quadriplegia; Paraplegia; Seizure Disorder Psychiatric Complaints and Symptoms: Negative for: Anxiety; Claustrophobia Medical History: Negative for: Anorexia/bulimia; Confinement Anxiety Oncologic Medical History: Negative for: Received Chemotherapy; Received Radiation Immunizations Pneumococcal Vaccine: Received Pneumococcal Vaccination: Yes Implantable Devices Family and Social History Never smoker; Marital Status - Widowed; Alcohol Use: Never; Drug Use: No History; Caffeine Use: Moderate; Financial Concerns: No; Food, Clothing or Shelter Needs: No; Support System Lacking: No; Transportation Concerns: No; Medical Power of Attorney: Yes - Angus Seller - nephew Notes patient and POA are  unaware of family medical history Electronic Signature(s) Signed: 01/30/2018 5:08:53 PM By: Montey Hora Signed: 01/30/2018 6:16:13 PM By: Lawanda Cousins Entered By: Montey Hora on 01/30/2018 13:25:37 Kallstrom, Jamice P. ([id number]) -------------------------------------------------------------------------------- SuperBill Details Patient Name: Sells, Naryah P. Date of Service: 01/30/2018 Medical Record Number: [id number] Patient Account Number: [id number] Date of Birth/Sex: Aug 21, 1918 (84 y.o. Female) Treating RN: Roger Shelter Primary Care Provider: Frazier Richards Other Clinician: Referring Provider: Frazier Richards Treating Provider/Extender: Cathie Olden in Treatment: 0 Diagnosis Coding ICD-10 Codes Code Description E11.622 Type 2 diabetes mellitus with other skin ulcer I70.232 Atherosclerosis of native arteries of right leg with ulceration of calf L97.212 Non-pressure chronic ulcer of right calf with fat layer exposed Facility Procedures CPT4 Code: 62952841 Description: 32440 - WOUND CARE VISIT-LEV 2 EST PT Modifier: Quantity: 1 CPT4 Code: 10272536 Description: 64403 - DEB  SUBQ TISSUE 20 SQ CM/< ICD-10 Diagnosis Description E11.622 Type 2 diabetes mellitus with other skin ulcer L97.212 Non-pressure chronic ulcer of right calf with fat layer exposed Modifier: Quantity: 1 Physician Procedures CPT4 Code: 6659935 Description: WC PHYS LEVEL 3 o NEW PT ICD-10 Diagnosis Description E11.622 Type 2 diabetes mellitus with other skin ulcer I70.232 Atherosclerosis of native arteries of right leg with ulceration L97.212 Non-pressure chronic ulcer of right calf with fat  layer exposed Modifier: of calf Quantity: 1 CPT4 Code: 7017793 Description: 90300 - WC PHYS SUBQ TISS 20 SQ CM ICD-10 Diagnosis Description E11.622 Type 2 diabetes mellitus with other skin ulcer L97.212 Non-pressure chronic ulcer of right calf with fat layer exposed Modifier: Quantity: 1 Electronic  Signature(s) Signed: 01/30/2018 2:33:14 PM By: Lawanda Cousins Entered By: Lawanda Cousins on 01/30/2018 14:33:14

## 2018-02-06 ENCOUNTER — Encounter
Admission: RE | Admit: 2018-02-06 | Discharge: 2018-02-06 | Disposition: A | Discharge status: Home / Self Care | Payer: Medicare Other | Source: Ambulatory Visit | Attending: Vascular Surgery | Admitting: Vascular Surgery

## 2018-02-06 ENCOUNTER — Other Ambulatory Visit: Payer: Self-pay

## 2018-02-06 DIAGNOSIS — Z8673 Personal history of transient ischemic attack (TIA), and cerebral infarction without residual deficits: Secondary | ICD-10-CM | POA: Insufficient documentation

## 2018-02-06 DIAGNOSIS — Z01810 Encounter for preprocedural cardiovascular examination: Secondary | ICD-10-CM | POA: Diagnosis not present

## 2018-02-06 DIAGNOSIS — Z01818 Encounter for other preprocedural examination: Secondary | ICD-10-CM | POA: Diagnosis not present

## 2018-02-06 LAB — CBC WITH DIFFERENTIAL/PLATELET
BASOS ABS: 0.1 10*3/uL (ref 0–0.1)
BASOS PCT: 1 %
Eosinophils Absolute: 0.3 10*3/uL (ref 0–0.7)
Eosinophils Relative: 4 %
HEMATOCRIT: 30.8 % — AB (ref 35.0–47.0)
HEMOGLOBIN: 10.6 g/dL — AB (ref 12.0–16.0)
Lymphocytes Relative: 19 %
Lymphs Abs: 1.2 10*3/uL (ref 1.0–3.6)
MCH: 32.7 pg (ref 26.0–34.0)
MCHC: 34.3 g/dL (ref 32.0–36.0)
MCV: 95.1 fL (ref 80.0–100.0)
Monocytes Absolute: 1 10*3/uL — ABNORMAL HIGH (ref 0.2–0.9)
Monocytes Relative: 16 %
NEUTROS ABS: 3.8 10*3/uL (ref 1.4–6.5)
NEUTROS PCT: 60 %
Platelets: 229 10*3/uL (ref 150–440)
RBC: 3.24 MIL/uL — ABNORMAL LOW (ref 3.80–5.20)
RDW: 15.1 % — ABNORMAL HIGH (ref 11.5–14.5)
WBC: 6.3 10*3/uL (ref 3.6–11.0)

## 2018-02-06 LAB — BASIC METABOLIC PANEL
ANION GAP: 9 (ref 5–15)
BUN: 33 mg/dL — ABNORMAL HIGH (ref 8–23)
CALCIUM: 9.3 mg/dL (ref 8.9–10.3)
CO2: 24 mmol/L (ref 22–32)
Chloride: 101 mmol/L (ref 98–111)
Creatinine, Ser: 1.25 mg/dL — ABNORMAL HIGH (ref 0.44–1.00)
GFR, EST AFRICAN AMERICAN: 41 mL/min — AB (ref >60)
GFR, EST NON AFRICAN AMERICAN: 35 mL/min — AB (ref >60)
Glucose, Bld: 169 mg/dL — ABNORMAL HIGH (ref 70–99)
Potassium: 5.1 mmol/L (ref 3.5–5.1)
Sodium: 134 mmol/L — ABNORMAL LOW (ref 135–145)

## 2018-02-06 LAB — PROTIME-INR
INR: 1.14
Prothrombin Time: 14.5 seconds (ref 11.4–15.2)

## 2018-02-06 LAB — APTT: APTT: 27 s (ref 24–36)

## 2018-02-06 NOTE — Patient Instructions (Addendum)
  Your procedure is scheduled on: Thursday February 13, 2018 Report to Same Day Surgery 2nd floor medical mall ([facility]-take elevator on left to 2nd floor.  Check in with surgery information desk.) To find out your arrival time please call [phone] between 1PM - 3PM on Wednesday February 12, 2018  Remember: Instructions that are not followed completely may result in serious medical risk, up to and including death, or upon the discretion of your surgeon and anesthesiologist your surgery may need to be rescheduled.    __x__ 1. Do not eat food (including mints, candies, chewing gum) after midnight the night before your procedure. You may drink water up to 2 hours before you are scheduled to arrive at the hospital for your procedure.  Do not drink anything within 2 hours of your scheduled arrival to the hospital.    __x__ 2. No Alcohol or tobacco use for 24 hours before or after surgery.   __x__ 3. Notify your doctor if there is any change in your medical condition (cold, fever, infections).   __x__ 4. On the morning of surgery brush your teeth with toothpaste and water.  You may rinse your mouth with mouth wash if you wish.  Do not swallow any toothpaste or mouthwash.  Please read over the following fact sheets that you were given:   [facility] Preparing for Surgery and or MRSA Information    __x__ Use CHG Soap or sage wipes as directed on instruction sheet    Do not wear jewelry, make-up, hairpins, clips or nail polish.  Do not wear lotions, powders, deodorant, or perfumes.   Do not shave below the face/neck 48 hours prior to surgery.   Do not bring valuables to the hospital.    [facility] is not responsible for any belongings or valuables.               Contacts, dentures or bridgework may not be worn into surgery.  For patients admitted to the hospital, discharge time is determined by your treatment team.  For patients discharged on the day of surgery, you will NOT  be permitted to drive yourself home.     _x___ Take anti-hypertensive listed below, cardiac, seizure, asthma, anti-reflux and psychiatric medicines. These include:  1. Pantoprazole/Protonix  2. Levothyroxine/Synthroid  3. Gabapentin/Neurontin if needed  4. Acetaminophen/Tylenol if needed  5. Other daily medicines can be administered after surgery

## 2018-02-06 NOTE — Pre-Procedure Instructions (Signed)
EKG COMPARED WITH OLD 

## 2018-02-07 ENCOUNTER — Encounter: Payer: Medicare Other | Admitting: Physician Assistant

## 2018-02-07 DIAGNOSIS — L97212 Non-pressure chronic ulcer of right calf with fat layer exposed: Secondary | ICD-10-CM | POA: Diagnosis not present

## 2018-02-07 DIAGNOSIS — L97519 Non-pressure chronic ulcer of other part of right foot with unspecified severity: Secondary | ICD-10-CM | POA: Diagnosis not present

## 2018-02-07 DIAGNOSIS — E1122 Type 2 diabetes mellitus with diabetic chronic kidney disease: Secondary | ICD-10-CM | POA: Diagnosis not present

## 2018-02-07 DIAGNOSIS — I70232 Atherosclerosis of native arteries of right leg with ulceration of calf: Secondary | ICD-10-CM | POA: Diagnosis not present

## 2018-02-07 DIAGNOSIS — E11622 Type 2 diabetes mellitus with other skin ulcer: Secondary | ICD-10-CM | POA: Diagnosis not present

## 2018-02-07 DIAGNOSIS — I129 Hypertensive chronic kidney disease with stage 1 through stage 4 chronic kidney disease, or unspecified chronic kidney disease: Secondary | ICD-10-CM | POA: Diagnosis not present

## 2018-02-12 MED ORDER — CEFAZOLIN SODIUM-DEXTROSE 2-4 GM/100ML-% IV SOLN
2.0000 g | INTRAVENOUS | Status: AC
  Administered 2018-02-13: 2 g via INTRAVENOUS

## 2018-02-12 NOTE — Progress Notes (Signed)
ILISA, HAYWORTH ([id number]) Visit Report for 02/07/2018 Arrival Information Details Patient Name: Ashley [name], [name] Date of Service: 02/07/2018 1:45 PM Medical Record Number: [id number] Patient Account Number: [id number] Date of Birth/Sex: September 07, 1918 (84 y.o. F) Treating RN: Secundino Ginger Primary Care Essam Lowdermilk: Frazier Richards Other Clinician: Referring Zarayah Lanting: Frazier Richards Treating Verbena Boeding/Extender: Melburn Hake, HOYT Weeks in Treatment: 1 Visit Information History Since Last Visit Added or deleted any medications: No Patient Arrived: Wheel Chair Any new allergies or adverse reactions: No Arrival Time: 13:51 Had a fall or experienced change in No Accompanied By: nephew activities of daily living that may affect Transfer Assistance: EasyPivot Patient risk of falls: Lift Signs or symptoms of abuse/neglect since last visito No Patient Identification Verified: Yes Hospitalized since last visit: No Secondary Verification Process Yes Implantable device outside of the clinic excluding No Completed: cellular tissue based products placed in the center Patient Has Alerts: Yes since last visit: Patient Alerts: ABI Chunky BILATERAL Has Dressing in Place as Prescribed: Yes AVVS 01/17/18 Pain Present Now: No TBI L .55 R .25 Electronic Signature(s) Signed: 02/07/2018 3:36:43 PM By: Secundino Ginger Entered By: Secundino Ginger on 02/07/2018 13:53:45 Mowrer, Satomi P. ([id number]) -------------------------------------------------------------------------------- Encounter Discharge Information Details Patient Name: Ashley [name], [name] Date of Service: 02/07/2018 1:45 PM Medical Record Number: [id number] Patient Account Number: [id number] Date of Birth/Sex: 05/21/1919 (84 y.o. F) Treating RN: Montey Hora Primary Care Alma Mohiuddin: Frazier Richards Other Clinician: Referring Chanelle Hodsdon: Frazier Richards Treating Denzil Bristol/Extender: Melburn Hake, HOYT Weeks in Treatment: 1 Encounter Discharge Information  Items Discharge Condition: Stable Ambulatory Status: Wheelchair Discharge Destination: Skilled Nursing Facility Telephoned: No Orders Sent: Yes Transportation: Private Auto Accompanied By: nephew Schedule Follow-up Appointment: Yes Clinical Summary of Care: Electronic Signature(s) Signed: 02/07/2018 2:55:15 PM By: Montey Hora Entered By: Montey Hora on 02/07/2018 14:55:15 Erby, Ersa P. ([id number]) -------------------------------------------------------------------------------- Lower Extremity Assessment Details Patient Name: Ashley [name], [name] Date of Service: 02/07/2018 1:45 PM Medical Record Number: [id number] Patient Account Number: [id number] Date of Birth/Sex: 1919/04/13 (84 y.o. F) Treating RN: Secundino Ginger Primary Care Jessia Kief: Frazier Richards Other Clinician: Referring Donnella Morford: Frazier Richards Treating Tabatha Razzano/Extender: Melburn Hake, HOYT Weeks in Treatment: 1 Electronic Signature(s) Signed: 02/07/2018 3:36:43 PM By: Secundino Ginger Entered By: Secundino Ginger on 02/07/2018 13:55:35 Kithcart, Kameah P. ([id number]) -------------------------------------------------------------------------------- Multi Wound Chart Details Patient Name: Kines, Malissia P. Date of Service: 02/07/2018 1:45 PM Medical Record Number: [id number] Patient Account Number: [id number] Date of Birth/Sex: April 13, 1919 (84 y.o. F) Treating RN: Montey Hora Primary Care Shaquia Berkley: Frazier Richards Other Clinician: Referring Neeta Storey: Frazier Richards Treating Ishia Tenorio/Extender: Melburn Hake, HOYT Weeks in Treatment: 1 Vital Signs Height(in): Pulse(bpm): 25 Weight(lbs): Blood Pressure(mmHg): 158/62 Body Mass Index(BMI): Temperature(F): 97.8 Respiratory Rate 18 (breaths/min): Photos: [N/A:N/A] Wound Location: Right Toe Third Right Lower Leg - Posterior N/A Wounding Event: Not Known Not Known N/A Primary Etiology: Diabetic Wound/Ulcer of the Diabetic Wound/Ulcer of the N/A Lower Extremity Lower  Extremity Secondary Etiology: N/A Arterial Insufficiency Ulcer N/A Comorbid History: Hypertension, Peripheral Hypertension, Peripheral N/A Arterial Disease, Type II Arterial Disease, Type II Diabetes, End Stage Renal Diabetes, End Stage Renal Disease Disease Date Acquired: 10/28/2017 10/28/2017 N/A Weeks of Treatment: 1 1 N/A Wound Status: Open Open N/A Pending Amputation on Yes Yes N/A Presentation: Measurements L x W x D 0.1x0.1x0.1 2.1x2.5x0.1 N/A (cm) Area (cm) : 0.008 4.123 N/A Volume (cm) : 0.001 0.412 N/A % Reduction in Area: 0.00% 37.50% N/A % Reduction in Volume: 0.00% 68.80% N/A Classification: Grade 2 Grade 1 N/A Exudate Amount: Medium Medium N/A Exudate Type:  Purulent Serosanguineous N/A Exudate Color: yellow, brown, green red, brown N/A Wound Margin: Flat and Intact Flat and Intact N/A Granulation Amount: None Present (0%) Medium (34-66%) N/A Granulation Quality: N/A Red, Hyper-granulation N/A Necrotic Amount: Large (67-100%) Medium (34-66%) N/A Necrotic Tissue: Eschar, Adherent [address] N/A Bulman, Dejana P. ([id number]) Exposed Structures: Fascia: No Fat Layer (Subcutaneous N/A Fat Layer (Subcutaneous Tissue) Exposed: Yes Tissue) Exposed: No Fascia: No Tendon: No Tendon: No Muscle: No Muscle: No Joint: No Joint: No Bone: No Bone: No Epithelialization: None None N/A Periwound Skin Texture: Excoriation: No Excoriation: No N/A Induration: No Induration: No Callus: No Callus: No Crepitus: No Crepitus: No Rash: No Rash: No Scarring: No Scarring: No Periwound Skin Moisture: Maceration: No Maceration: No N/A Dry/Scaly: No Dry/Scaly: No Periwound Skin Color: Erythema: Yes Atrophie Blanche: No N/A Atrophie Blanche: No Cyanosis: No Cyanosis: No Ecchymosis: No Ecchymosis: No Erythema: No Hemosiderin Staining: No Hemosiderin Staining: No Mottled: No Mottled: No Pallor: No Pallor: No Rubor: No Rubor: No Erythema Location:  Circumferential N/A N/A Temperature: No Abnormality No Abnormality N/A Tenderness on Palpation: Yes Yes N/A Wound Preparation: Ulcer Cleansing: Ulcer Cleansing: N/A Rinsed/Irrigated with Saline Rinsed/Irrigated with Saline Topical Anesthetic Applied: Topical Anesthetic Applied: Other: lidocaine 4% Other: lidocaine 4% Treatment Notes Electronic Signature(s) Signed: 02/10/2018 4:11:20 PM By: Montey Hora Entered By: Montey Hora on 02/07/2018 14:34:10 Vanputten, Odalys P. ([id number]) -------------------------------------------------------------------------------- Wallace Details Patient Name: Touchette, Irma P. Date of Service: 02/07/2018 1:45 PM Medical Record Number: [id number] Patient Account Number: [id number] Date of Birth/Sex: 07-06-1918 (84 y.o. F) Treating RN: Montey Hora Primary Care Hazelynn Mckenny: Frazier Richards Other Clinician: Referring Arnie Clingenpeel: Frazier Richards Treating Rodrigues Urbanek/Extender: Melburn Hake, HOYT Weeks in Treatment: 1 Active Inactive ` Orientation to the Wound Care Program Nursing Diagnoses: Knowledge deficit related to the wound healing center program Goals: Patient/caregiver will verbalize understanding of the [place] Program Date Initiated: 01/30/2018 Target Resolution Date: 02/20/2018 Goal Status: Active Interventions: Provide education on orientation to the wound center Notes: ` Wound/Skin Impairment Nursing Diagnoses: Impaired tissue integrity Goals: Patient/caregiver will verbalize understanding of skin care regimen Date Initiated: 01/30/2018 Target Resolution Date: 02/19/2018 Goal Status: Active Ulcer/skin breakdown will have a volume reduction of 30% by week 4 Date Initiated: 01/30/2018 Target Resolution Date: 02/19/2018 Goal Status: Active Interventions: Assess patient/caregiver ability to obtain necessary supplies Assess patient/caregiver ability to perform ulcer/skin care regimen upon admission and as  needed Assess ulceration(s) every visit Treatment Activities: Skin care regimen initiated : 01/30/2018 Notes: Electronic Signature(s) Signed: 02/10/2018 4:11:20 PM By: Loretha Brasil, Jahnae Mamie Nick ([id number]) Entered By: Montey Hora on 02/07/2018 14:33:58 Kabat, Daylene P. ([id number]) -------------------------------------------------------------------------------- Pain Assessment Details Patient Name: Ashley [name], [name] Date of Service: 02/07/2018 1:45 PM Medical Record Number: [id number] Patient Account Number: [id number] Date of Birth/Sex: 1919-01-10 (84 y.o. F) Treating RN: Secundino Ginger Primary Care Alexa Golebiewski: Frazier Richards Other Clinician: Referring Myrth Dahan: Frazier Richards Treating Cung Masterson/Extender: Melburn Hake, HOYT Weeks in Treatment: 1 Active Problems Location of Pain Severity and Description of Pain Patient Has Paino No Site Locations Pain Management and Medication Current Pain Management: Goals for Pain Management pt denies any pain at this time. Electronic Signature(s) Signed: 02/07/2018 3:36:43 PM By: Secundino Ginger Entered By: Secundino Ginger on 02/07/2018 13:54:36 Morais, Jermesha P. ([id number]) -------------------------------------------------------------------------------- Patient/Caregiver Education Details Patient Name: Ashley [name], [name] Date of Service: 02/07/2018 1:45 PM Medical Record Number: [id number] Patient Account Number: [id number] Date of Birth/Gender: [birth year] (84 y.o. F) Treating RN: Montey Hora Primary Care Physician: Frazier Richards Other Clinician:  Referring Physician: Frazier Richards Treating Physician/Extender: Melburn Hake, HOYT Weeks in Treatment: 1 Education Assessment Education Provided To: Caregiver SNF nurses via written orders Education Topics Provided Wound/Skin Impairment: Handouts: Other: wound care orders Methods: Pharmacist, hospital) Signed: 02/10/2018 4:11:20 PM By: Montey Hora Entered By: Montey Hora on 02/07/2018  14:55:52 Will, Deina P. ([id number]) -------------------------------------------------------------------------------- Wound Assessment Details Patient Name: Ashley [name], [name] Date of Service: 02/07/2018 1:45 PM Medical Record Number: [id number] Patient Account Number: [id number] Date of Birth/Sex: 16-Mar-1919 (84 y.o. F) Treating RN: Secundino Ginger Primary Care Tryphena Perkovich: Frazier Richards Other Clinician: Referring Yuvraj Pfeifer: Frazier Richards Treating Jeffie Widdowson/Extender: Melburn Hake, HOYT Weeks in Treatment: 1 Wound Status Wound Number: 1 Primary Diabetic Wound/Ulcer of the Lower Extremity Etiology: Wound Location: Right Toe Third Wound Open Wounding Event: Not Known Status: Date Acquired: 10/28/2017 Comorbid Hypertension, Peripheral Arterial Disease, Weeks Of Treatment: 1 History: Type II Diabetes, End Stage Renal Disease Clustered Wound: No Pending Amputation On Presentation Photos Photo Uploaded By: Secundino Ginger on 02/07/2018 14:09:08 Wound Measurements Length: (cm) 0.1 % Reduction Width: (cm) 0.1 % Reduction Depth: (cm) 0.1 Epitheliali Area: (cm) 0.008 Tunneling: Volume: (cm) 0.001 Underminin in Area: 0% in Volume: 0% zation: None No g: No Wound Description Classification: Grade 2 Foul Odor A Wound Margin: Flat and Intact Slough/Fibr Exudate Amount: Medium Exudate Type: Purulent Exudate Color: yellow, brown, green fter Cleansing: No ino Yes Wound Bed Granulation Amount: None Present (0%) Exposed Structure Necrotic Amount: Large (67-100%) Fascia Exposed: No Necrotic Quality: Eschar, Adherent Slough Fat Layer (Subcutaneous Tissue) Exposed: No Tendon Exposed: No Muscle Exposed: No Joint Exposed: No Bone Exposed: No Periwound Skin Texture Guillotte, Mykell P. ([id number]) Texture Color No Abnormalities Noted: No No Abnormalities Noted: No Callus: No Atrophie Blanche: No Crepitus: No Cyanosis: No Excoriation: No Ecchymosis: No Induration: No Erythema: Yes Rash:  No Erythema Location: Circumferential Scarring: No Hemosiderin Staining: No Mottled: No Moisture Pallor: No No Abnormalities Noted: No Rubor: No Dry / Scaly: No Maceration: No Temperature / Pain Temperature: No Abnormality Tenderness on Palpation: Yes Wound Preparation Ulcer Cleansing: Rinsed/Irrigated with Saline Topical Anesthetic Applied: Other: lidocaine 4%, Treatment Notes Wound #1 (Right Toe Third) 1. Cleansed with: Clean wound with Normal Saline 2. Anesthetic Topical Lidocaine 4% cream to wound bed prior to debridement 4. Dressing Applied: Santyl Ointment 5. Secondary Dressing Applied ABD Pad Saline moistened guaze Gauze and Kerlix/Conform 7. Secured with Recruitment consultant) Signed: 02/07/2018 3:36:43 PM By: Secundino Ginger Entered By: Secundino Ginger on 02/07/2018 14:04:09 Thal, Shyna P. ([id number]) -------------------------------------------------------------------------------- Wound Assessment Details Patient Name: Ashley [name], [name] Date of Service: 02/07/2018 1:45 PM Medical Record Number: [id number] Patient Account Number: [id number] Date of Birth/Sex: June 18, 1918 (84 y.o. F) Treating RN: Secundino Ginger Primary Care Garek Schuneman: Frazier Richards Other Clinician: Referring Zerline Melchior: Frazier Richards Treating Margaurite Salido/Extender: Melburn Hake, HOYT Weeks in Treatment: 1 Wound Status Wound Number: 2 Primary Diabetic Wound/Ulcer of the Lower Extremity Etiology: Wound Location: Right Lower Leg - Posterior Secondary Arterial Insufficiency Ulcer Wounding Event: Not Known Etiology: Date Acquired: 10/28/2017 Wound Status: Open Weeks Of Treatment: 1 Comorbid Hypertension, Peripheral Arterial Disease, Clustered Wound: No History: Type II Diabetes, End Stage Renal Disease Pending Amputation On Presentation Photos Photo Uploaded By: Secundino Ginger on 02/07/2018 14:09:09 Wound Measurements Length: (cm) 2.1 % Reduction Width: (cm) 2.5 % Reduction Depth: (cm) 0.1 Epitheliali Area:  (cm) 4.123 Tunneling: Volume: (cm) 0.412 Underminin in Area: 37.5% in Volume: 68.8% zation: None No g: No Wound Description Classification: Grade 1 Foul Odor A Wound Margin: Flat and Intact  Slough/Fibr Exudate Amount: Medium Exudate Type: Serosanguineous Exudate Color: red, brown fter Cleansing: No ino Yes Wound Bed Granulation Amount: Medium (34-66%) Exposed Structure Granulation Quality: Red, Hyper-granulation Fascia Exposed: No Necrotic Amount: Medium (34-66%) Fat Layer (Subcutaneous Tissue) Exposed: Yes Necrotic Quality: Adherent Slough Tendon Exposed: No Muscle Exposed: No Joint Exposed: No Bone Exposed: No Periwound Skin Texture Ashley [name], [name] ([id number]) Texture Color No Abnormalities Noted: No No Abnormalities Noted: No Callus: No Atrophie Blanche: No Crepitus: No Cyanosis: No Excoriation: No Ecchymosis: No Induration: No Erythema: No Rash: No Hemosiderin Staining: No Scarring: No Mottled: No Pallor: No Moisture Rubor: No No Abnormalities Noted: No Dry / Scaly: No Temperature / Pain Maceration: No Temperature: No Abnormality Tenderness on Palpation: Yes Wound Preparation Ulcer Cleansing: Rinsed/Irrigated with Saline Topical Anesthetic Applied: Other: lidocaine 4%, Treatment Notes Wound #2 (Right, Posterior Lower Leg) 1. Cleansed with: Clean wound with Normal Saline 2. Anesthetic Topical Lidocaine 4% cream to wound bed prior to debridement 4. Dressing Applied: Santyl Ointment 5. Secondary Dressing Applied ABD Pad Saline moistened guaze Gauze and Kerlix/Conform 7. Secured with Recruitment consultant) Signed: 02/07/2018 3:36:43 PM By: Secundino Ginger Entered By: Secundino Ginger on 02/07/2018 14:02:34 Ashley [name], [name] ([id number]) -------------------------------------------------------------------------------- Vitals Details Patient Name: Ashley [name], [name] Date of Service: 02/07/2018 1:45 PM Medical Record Number: [id number] Patient Account  Number: [id number] Date of Birth/Sex: 08-03-1918 (84 y.o. F) Treating RN: Secundino Ginger Primary Care Ruth Kovich: Frazier Richards Other Clinician: Referring Conchetta Lamia: Frazier Richards Treating Tahsin Benyo/Extender: Melburn Hake, HOYT Weeks in Treatment: 1 Vital Signs Time Taken: 13:54 Temperature (F): 97.8 Pulse (bpm): 79 Respiratory Rate (breaths/min): 18 Blood Pressure (mmHg): 158/62 Reference Range: 80 - 120 mg / dl Electronic Signature(s) Signed: 02/07/2018 3:36:43 PM By: Secundino Ginger Entered By: Secundino Ginger on 02/07/2018 13:56:21

## 2018-02-12 NOTE — Progress Notes (Signed)
FREDERICA, CHRESTMAN ([id number]) Visit Report for 02/07/2018 Chief Complaint Document Details Patient Name: Ashley [name], [name] Date of Service: 02/07/2018 1:45 PM Medical Record Number: [id number] Patient Account Number: [id number] Date of Birth/Sex: 03-18-1919 (84 y.o. F) Treating RN: Montey Hora Primary Care Provider: Frazier Richards Other Clinician: Referring Provider: Frazier Richards Treating Provider/Extender: Melburn Hake, HOYT Weeks in Treatment: 1 Information Obtained from: Patient Chief Complaint Right posterior calf Electronic Signature(s) Signed: 02/07/2018 10:43:54 PM By: Worthy Keeler PA-C Entered By: Worthy Keeler on 02/07/2018 14:13:44 Hensarling, Jeannine P. ([id number]) -------------------------------------------------------------------------------- Debridement Details Patient Name: Ashley [name], [name] Date of Service: 02/07/2018 1:45 PM Medical Record Number: [id number] Patient Account Number: [id number] Date of Birth/Sex: 09/08/1918 (84 y.o. F) Treating RN: Montey Hora Primary Care Provider: Frazier Richards Other Clinician: Referring Provider: Frazier Richards Treating Provider/Extender: Melburn Hake, HOYT Weeks in Treatment: 1 Debridement Performed for Wound #2 Right,Posterior Lower Leg Assessment: Performed By: Physician STONE III, HOYT E., PA-C Debridement Type: Debridement Severity of Tissue Pre Fat layer exposed Debridement: Pre-procedure Verification/Time Yes - 14:37 Out Taken: Start Time: 14:37 Pain Control: Lidocaine 4% Topical Solution Total Area Debrided (L x W): 2.1 (cm) x 2.5 (cm) = 5.25 (cm) Tissue and other material Viable, Non-Viable, Slough, Subcutaneous, Biofilm, Slough, Hyper-granulation debrided: Level: Skin/Subcutaneous Tissue Debridement Description: Excisional Instrument: Curette Bleeding: Minimum Hemostasis Achieved: Pressure End Time: 14:40 Procedural Pain: 0 Post Procedural Pain: 0 Response to Treatment: Procedure was tolerated  well Level of Consciousness: Awake and Alert Post Debridement Measurements of Total Wound Length: (cm) 2.1 Width: (cm) 2.5 Depth: (cm) 0.2 Volume: (cm) 0.825 Character of Wound/Ulcer Post Debridement: Improved Severity of Tissue Post Debridement: Fat layer exposed Post Procedure Diagnosis Same as Pre-procedure Electronic Signature(s) Signed: 02/07/2018 10:43:54 PM By: Worthy Keeler PA-C Signed: 02/10/2018 4:11:20 PM By: Montey Hora Entered By: Montey Hora on 02/07/2018 14:40:26 Mitchelle, Maylin P. ([id number]) -------------------------------------------------------------------------------- HPI Details Patient Name: Ashley [name], [name] Date of Service: 02/07/2018 1:45 PM Medical Record Number: [id number] Patient Account Number: [id number] Date of Birth/Sex: 1918-12-14 (84 y.o. F) Treating RN: Montey Hora Primary Care Provider: Frazier Richards Other Clinician: Referring Provider: Frazier Richards Treating Provider/Extender: Melburn Hake, HOYT Weeks in Treatment: 1 History of Present Illness HPI Description: 01/30/18-She is seen in initial evaluation for a right plantar calf ulcer. She is being treated for both the calf ulcer and a right second toe ulcer; the right second toe is planned for amputation on 9/19. The facility has been treating both ulcers with santyl. On 8/12 she underwent angiogram of the right lower extremity with angioplasty to the posterior tibial, right mid distal SFA, popliteal artery with stent placement to the right popliteal artery; post-procedure ABI-non-compressible bilaterally, RIGHT TBI 0.25, LEFT TBI 0.55. The right posterior calf ulcer is healthy with minimal amount of non-viable tissue remaining. We will continue with santyl, with expectation to change treatment plan next week. She is a diabetic with last A1c 7.6 (01/2018) 02/07/18 on evaluation today patient actually appears to be showing signs of good improvement in regard to the right posterior lower  extremity. With that being said the right second toe ulcer is actually plan for amputation at this point secondary to infection and osteomyelitis. This is actually scheduled for next Thursday. With that being said her wound seems to be healing quite nicely in regard to the posterior ulcer at this time she does have some Slough noted. That's in regard to lower extremity. Electronic Signature(s) Signed: 02/07/2018 10:43:54 PM By: Worthy Keeler PA-C Entered By: Joaquim Lai  III, Hoyt on 02/07/2018 15:28:01 Ashley [name], [name] ([id number]) -------------------------------------------------------------------------------- Physical Exam Details Patient Name: Ashley [name], [name] Date of Service: 02/07/2018 1:45 PM Medical Record Number: [id number] Patient Account Number: [id number] Date of Birth/Sex: Aug 05, 1918 (84 y.o. F) Treating RN: Montey Hora Primary Care Provider: Frazier Richards Other Clinician: Referring Provider: Frazier Richards Treating Provider/Extender: STONE III, HOYT Weeks in Treatment: 1 Constitutional Well-nourished and well-hydrated in no acute distress. Respiratory normal breathing without difficulty. Psychiatric this patient is able to make decisions and demonstrates good insight into disease process. Alert and Oriented x 3. pleasant and cooperative. Notes At this point I am very happy with how things have progress currently. The patient is tolerating the dressing changes without complication as far as the posterior lower extremity ulcer is concerned. This did require sharp debridement today which was performed without complication post debridement the wound bed appears to be significantly improved. In regard to the right second toe ulcer again this is plan for amputation. Electronic Signature(s) Signed: 02/07/2018 10:43:54 PM By: Worthy Keeler PA-C Entered By: Worthy Keeler on 02/07/2018 15:28:38 Ashley [name], [name]  ([id number]) -------------------------------------------------------------------------------- Physician Orders Details Patient Name: Ashley [name], [name] Date of Service: 02/07/2018 1:45 PM Medical Record Number: [id number] Patient Account Number: [id number] Date of Birth/Sex: 1919-02-16 (84 y.o. F) Treating RN: Montey Hora Primary Care Provider: Frazier Richards Other Clinician: Referring Provider: Frazier Richards Treating Provider/Extender: Melburn Hake, HOYT Weeks in Treatment: 1 Verbal / Phone Orders: No Diagnosis Coding ICD-10 Coding Code Description E11.622 Type 2 diabetes mellitus with other skin ulcer I70.232 Atherosclerosis of native arteries of right leg with ulceration of calf L97.212 Non-pressure chronic ulcer of right calf with fat layer exposed Wound Cleansing Wound #1 Right Toe Third o Clean wound with Normal Saline. Wound #2 Right,Posterior Lower Leg o Clean wound with Normal Saline. Anesthetic (add to Medication List) Wound #1 Right Toe Third o Topical Lidocaine 4% cream applied to wound bed prior to debridement (In Clinic Only). Wound #2 Right,Posterior Lower Leg o Topical Lidocaine 4% cream applied to wound bed prior to debridement (In Clinic Only). Primary Wound Dressing Wound #1 Right Toe Third o Santyl Ointment o Other: - saline gauze over santyl Wound #2 Right,Posterior Lower Leg o Santyl Ointment o Other: - saline gauze over santyl Secondary Dressing Wound #1 Right Toe Third o Dry Gauze - over saline gauze o Conform/Kerlix - with ace wrap Wound #2 Right,Posterior Lower Leg o Dry Gauze - over saline gauze o Conform/Kerlix - with ace wrap Dressing Change Frequency Wound #1 Right Toe Third o Change dressing every day. Wound #2 Right,Posterior Lower Leg Ashley [name], [name] ([id number]) o Change dressing every day. Follow-up Appointments Wound #1 Right Toe Third o Return Appointment in 1 week. Wound #2 Right,Posterior Lower  Leg o Return Appointment in 1 week. Electronic Signature(s) Signed: 02/07/2018 10:43:54 PM By: Worthy Keeler PA-C Signed: 02/10/2018 4:11:20 PM By: Montey Hora Entered By: Montey Hora on 02/07/2018 14:41:55 Burford, Sairah P. ([id number]) -------------------------------------------------------------------------------- Problem List Details Patient Name: Ashley [name], [name] Date of Service: 02/07/2018 1:45 PM Medical Record Number: [id number] Patient Account Number: [id number] Date of Birth/Sex: 08-Oct-1918 (84 y.o. F) Treating RN: Montey Hora Primary Care Provider: Frazier Richards Other Clinician: Referring Provider: Frazier Richards Treating Provider/Extender: Melburn Hake, HOYT Weeks in Treatment: 1 Active Problems ICD-10 Evaluated Encounter Code Description Active Date Today Diagnosis E11.622 Type 2 diabetes mellitus with other skin ulcer 01/30/2018 No Yes I70.232 Atherosclerosis of native arteries of right leg with ulceration of 01/30/2018 No Yes  calf L97.212 Non-pressure chronic ulcer of right calf with fat layer exposed 01/30/2018 No Yes Inactive Problems Resolved Problems Electronic Signature(s) Signed: 02/07/2018 10:43:54 PM By: Worthy Keeler PA-C Entered By: Worthy Keeler on 02/07/2018 14:13:38 President, Zitlali P. ([id number]) -------------------------------------------------------------------------------- Progress Note Details Patient Name: Ashley [name], [name] Date of Service: 02/07/2018 1:45 PM Medical Record Number: [id number] Patient Account Number: [id number] Date of Birth/Sex: 05/06/1919 (84 y.o. F) Treating RN: Montey Hora Primary Care Provider: Frazier Richards Other Clinician: Referring Provider: Frazier Richards Treating Provider/Extender: Melburn Hake, HOYT Weeks in Treatment: 1 Subjective Chief Complaint Information obtained from Patient Right posterior calf History of Present Illness (HPI) 01/30/18-She is seen in initial evaluation for a right plantar calf  ulcer. She is being treated for both the calf ulcer and a right second toe ulcer; the right second toe is planned for amputation on 9/19. The facility has been treating both ulcers with santyl. On 8/12 she underwent angiogram of the right lower extremity with angioplasty to the posterior tibial, right mid distal SFA, popliteal artery with stent placement to the right popliteal artery; post-procedure ABI-non-compressible bilaterally, RIGHT TBI 0.25, LEFT TBI 0.55. The right posterior calf ulcer is healthy with minimal amount of non-viable tissue remaining. We will continue with santyl, with expectation to change treatment plan next week. She is a diabetic with last A1c 7.6 (01/2018) 02/07/18 on evaluation today patient actually appears to be showing signs of good improvement in regard to the right posterior lower extremity. With that being said the right second toe ulcer is actually plan for amputation at this point secondary to infection and osteomyelitis. This is actually scheduled for next Thursday. With that being said her wound seems to be healing quite nicely in regard to the posterior ulcer at this time she does have some Slough noted. That's in regard to lower extremity. Patient History Information obtained from Patient. Social History Never smoker, Marital Status - Widowed, Alcohol Use - Never, Drug Use - No History, Caffeine Use - Moderate. Medical And Surgical History Notes Ear/Nose/Mouth/Throat HOH Cardiovascular stent placed in right leg 3 weeks ago Review of Systems (ROS) Constitutional Symptoms (General Health) Denies complaints or symptoms of Fever, Chills. Respiratory The patient has no complaints or symptoms. Cardiovascular The patient has no complaints or symptoms. Psychiatric The patient has no complaints or symptoms. Ashley [name], [name] ([id number]) Objective Constitutional Well-nourished and well-hydrated in no acute distress. Vitals Time Taken: 1:54 PM, Temperature:  97.8 F, Pulse: 79 bpm, Respiratory Rate: 18 breaths/min, Blood Pressure: 158/62 mmHg. Respiratory normal breathing without difficulty. Psychiatric this patient is able to make decisions and demonstrates good insight into disease process. Alert and Oriented x 3. pleasant and cooperative. General Notes: At this point I am very happy with how things have progress currently. The patient is tolerating the dressing changes without complication as far as the posterior lower extremity ulcer is concerned. This did require sharp debridement today which was performed without complication post debridement the wound bed appears to be significantly improved. In regard to the right second toe ulcer again this is plan for amputation. Integumentary (Hair, Skin) Wound #1 status is Open. Original cause of wound was Not Known. The wound is located on the Right Toe Third. The wound measures 0.1cm length x 0.1cm width x 0.1cm depth; 0.008cm^2 area and 0.001cm^3 volume. There is no tunneling or undermining noted. There is a medium amount of purulent drainage noted. The wound margin is flat and intact. There is no granulation within the wound bed.  There is a large (67-100%) amount of necrotic tissue within the wound bed including Eschar and Adherent Slough. The periwound skin appearance exhibited: Erythema. The periwound skin appearance did not exhibit: Callus, Crepitus, Excoriation, Induration, Rash, Scarring, Dry/Scaly, Maceration, Atrophie Blanche, Cyanosis, Ecchymosis, Hemosiderin Staining, Mottled, Pallor, Rubor. The surrounding wound skin color is noted with erythema which is circumferential. Periwound temperature was noted as No Abnormality. The periwound has tenderness on palpation. Wound #2 status is Open. Original cause of wound was Not Known. The wound is located on the Right,Posterior Lower Leg. The wound measures 2.1cm length x 2.5cm width x 0.1cm depth; 4.123cm^2 area and 0.412cm^3 volume. There is Fat  Layer (Subcutaneous Tissue) Exposed exposed. There is no tunneling or undermining noted. There is a medium amount of serosanguineous drainage noted. The wound margin is flat and intact. There is medium (34-66%) red, hyper - granulation within the wound bed. There is a medium (34-66%) amount of necrotic tissue within the wound bed including Adherent Slough. The periwound skin appearance did not exhibit: Callus, Crepitus, Excoriation, Induration, Rash, Scarring, Dry/Scaly, Maceration, Atrophie Blanche, Cyanosis, Ecchymosis, Hemosiderin Staining, Mottled, Pallor, Rubor, Erythema. Periwound temperature was noted as No Abnormality. The periwound has tenderness on palpation. Assessment Active Problems ICD-10 Type 2 diabetes mellitus with other skin ulcer Atherosclerosis of native arteries of right leg with ulceration of calf Non-pressure chronic ulcer of right calf with fat layer exposed Jean, Marquasia P. ([id number]) Procedures Wound #2 Pre-procedure diagnosis of Wound #2 is a Diabetic Wound/Ulcer of the Lower Extremity located on the Right,Posterior Lower Leg .Severity of Tissue Pre Debridement is: Fat layer exposed. There was a Excisional Skin/Subcutaneous Tissue Debridement with a total area of 5.25 sq cm performed by STONE III, HOYT E., PA-C. With the following instrument(s): Curette to remove Viable and Non-Viable tissue/material. Material removed includes Subcutaneous Tissue, Slough, Biofilm, and Hyper-granulation after achieving pain control using Lidocaine 4% Topical Solution. No specimens were taken. A time out was conducted at 14:37, prior to the start of the procedure. A Minimum amount of bleeding was controlled with Pressure. The procedure was tolerated well with a pain level of 0 throughout and a pain level of 0 following the procedure. Patient s Level of Consciousness post procedure was recorded as Awake and Alert. Post Debridement Measurements: 2.1cm length x 2.5cm width x 0.2cm depth;  0.825cm^3 volume. Character of Wound/Ulcer Post Debridement is improved. Severity of Tissue Post Debridement is: Fat layer exposed. Post procedure Diagnosis Wound #2: Same as Pre-Procedure Plan Wound Cleansing: Wound #1 Right Toe Third: Clean wound with Normal Saline. Wound #2 Right,Posterior Lower Leg: Clean wound with Normal Saline. Anesthetic (add to Medication List): Wound #1 Right Toe Third: Topical Lidocaine 4% cream applied to wound bed prior to debridement (In Clinic Only). Wound #2 Right,Posterior Lower Leg: Topical Lidocaine 4% cream applied to wound bed prior to debridement (In Clinic Only). Primary Wound Dressing: Wound #1 Right Toe Third: Santyl Ointment Other: - saline gauze over santyl Wound #2 Right,Posterior Lower Leg: Santyl Ointment Other: - saline gauze over santyl Secondary Dressing: Wound #1 Right Toe Third: Dry Gauze - over saline gauze Conform/Kerlix - with ace wrap Wound #2 Right,Posterior Lower Leg: Dry Gauze - over saline gauze Conform/Kerlix - with ace wrap Dressing Change Frequency: Wound #1 Right Toe Third: Change dressing every day. Wound #2 Right,Posterior Lower Leg: Change dressing every day. Follow-up Appointments: Wound #1 Right Toe Third: Return Appointment in 1 week. Ashley [name], [name] ([id number]) Wound #2 Right,Posterior Lower Leg: Return Appointment in  1 week. I am going to suggest currently that we continue with the above wound care measures for the next week. Patients in agreement the plan. We watched to see her back for reevaluation in two weeks time due to the fact that she is having a patient surgery next week we will see her back following. She's in agreement with the plan. Patient's nephew was in the room with her today during the entire evaluation. Follow clinic Please see above for specific wound care orders. We will see patient for re-evaluation in 2 week(s) here in the clinic. If anything worsens or changes patient will contact  our office for additional recommendations. Electronic Signature(s) Signed: 02/07/2018 10:43:54 PM By: Worthy Keeler PA-C Entered By: Worthy Keeler on 02/07/2018 15:29:29 Ashley [name], [name] ([id number]) -------------------------------------------------------------------------------- ROS/PFSH Details Patient Name: Ashley [name], Maggie P. Date of Service: 02/07/2018 1:45 PM Medical Record Number: [id number] Patient Account Number: [id number] Date of Birth/Sex: 06/14/1918 (84 y.o. F) Treating RN: Montey Hora Primary Care Provider: Frazier Richards Other Clinician: Referring Provider: Frazier Richards Treating Provider/Extender: Melburn Hake, HOYT Weeks in Treatment: 1 Information Obtained From Patient Wound History Do you currently have one or more open woundso Yes How many open wounds do you currently haveo 2 Approximately how long have you had your woundso 3 months How have you been treating your wound(s) until nowo santyl Has your wound(s) ever healed and then re-openedo No Have you had any lab work done in the past montho No Have you tested positive for an antibiotic resistant organism (MRSA, VRE)o No Have you tested positive for osteomyelitis (bone infection)o No Have you had any tests for circulation on your legso Yes Where was the test doneo [facility] Constitutional Symptoms (General Health) Complaints and Symptoms: Negative for: Fever; Chills Eyes Medical History: Negative for: Cataracts; Glaucoma; Optic Neuritis Ear/Nose/Mouth/Throat Medical History: Negative for: Chronic sinus problems/congestion; Middle ear problems Past Medical History Notes: HOH Hematologic/Lymphatic Medical History: Negative for: Anemia; Hemophilia; Human Immunodeficiency Virus; Lymphedema; Sickle Cell Disease Respiratory Complaints and Symptoms: No Complaints or Symptoms Medical History: Negative for: Aspiration; Asthma; Chronic Obstructive Pulmonary Disease (COPD); Pneumothorax; Sleep  Apnea; Tuberculosis Cardiovascular Complaints and Symptoms: No Complaints or Symptoms Balcerzak, Kemaria P. ([id number]) Medical History: Positive for: Hypertension; Peripheral Arterial Disease Negative for: Angina; Arrhythmia; Congestive Heart Failure; Coronary Artery Disease; Deep Vein Thrombosis; Hypotension; Myocardial Infarction; Peripheral Venous Disease; Phlebitis; Vasculitis Past Medical History Notes: stent placed in right leg 3 weeks ago Gastrointestinal Medical History: Negative for: Cirrhosis ; Colitis; Crohnos; Hepatitis A; Hepatitis B; Hepatitis C Endocrine Medical History: Positive for: Type II Diabetes Negative for: Type I Diabetes Genitourinary Medical History: Positive for: End Stage Renal Disease - CKD stage 3 Immunological Medical History: Negative for: Lupus Erythematosus; Raynaudos; Scleroderma Integumentary (Skin) Medical History: Negative for: History of Burn; History of pressure wounds Musculoskeletal Medical History: Negative for: Gout; Rheumatoid Arthritis; Osteoarthritis; Osteomyelitis Neurologic Medical History: Negative for: Dementia; Neuropathy; Quadriplegia; Paraplegia; Seizure Disorder Oncologic Medical History: Negative for: Received Chemotherapy; Received Radiation Psychiatric Complaints and Symptoms: No Complaints or Symptoms Medical History: Negative for: Anorexia/bulimia; Confinement Anxiety Immunizations Pneumococcal Vaccine: Cotugno, Shaughnessy P. ([id number]) Received Pneumococcal Vaccination: Yes Implantable Devices Family and Social History Never smoker; Marital Status - Widowed; Alcohol Use: Never; Drug Use: No History; Caffeine Use: Moderate; Financial Concerns: No; Food, Clothing or Shelter Needs: No; Support System Lacking: No; Transportation Concerns: No; Medical Power of Attorney: Yes - Angus Seller - nephew (Not Provided) Physician Affirmation I have reviewed and agree with the above information.  Electronic Signature(s) Signed:  02/07/2018 10:43:54 PM By: Worthy Keeler PA-C Signed: 02/10/2018 4:11:20 PM By: Montey Hora Entered By: Worthy Keeler on 02/07/2018 15:28:18 Gillian, Kathaleen P. ([id number]) -------------------------------------------------------------------------------- SuperBill Details Patient Name: Vereen, Shanta P. Date of Service: 02/07/2018 Medical Record Number: [id number] Patient Account Number: [id number] Date of Birth/Sex: 01-19-1919 (84 y.o. F) Treating RN: Montey Hora Primary Care Provider: Frazier Richards Other Clinician: Referring Provider: Frazier Richards Treating Provider/Extender: Melburn Hake, HOYT Weeks in Treatment: 1 Diagnosis Coding ICD-10 Codes Code Description E11.622 Type 2 diabetes mellitus with other skin ulcer I70.232 Atherosclerosis of native arteries of right leg with ulceration of calf L97.212 Non-pressure chronic ulcer of right calf with fat layer exposed Facility Procedures CPT4 Code: 91225834 Description: 62194 - DEB SUBQ TISSUE 20 SQ CM/< ICD-10 Diagnosis Description L97.212 Non-pressure chronic ulcer of right calf with fat layer exp Modifier: osed Quantity: 1 Physician Procedures CPT4 Code: 7125271 Description: 29290 - WC PHYS SUBQ TISS 20 SQ CM ICD-10 Diagnosis Description L97.212 Non-pressure chronic ulcer of right calf with fat layer exp Modifier: osed Quantity: 1 Electronic Signature(s) Signed: 02/07/2018 10:43:54 PM By: Worthy Keeler PA-C Entered By: Worthy Keeler on 02/07/2018 15:29:44

## 2018-02-13 ENCOUNTER — Encounter: Payer: Self-pay | Admitting: *Deleted

## 2018-02-13 ENCOUNTER — Other Ambulatory Visit: Payer: Self-pay

## 2018-02-13 ENCOUNTER — Ambulatory Visit: Payer: Medicare Other | Admitting: Anesthesiology

## 2018-02-13 ENCOUNTER — Encounter
Admission: RE | Disposition: A | Discharge status: Home / Self Care | Payer: Self-pay | Source: Ambulatory Visit | Attending: Vascular Surgery

## 2018-02-13 ENCOUNTER — Ambulatory Visit
Admission: RE | Admit: 2018-02-13 | Discharge: 2018-02-13 | Disposition: A | Discharge status: Home / Self Care | Payer: Medicare Other | Source: Ambulatory Visit | Attending: Vascular Surgery | Admitting: Vascular Surgery

## 2018-02-13 DIAGNOSIS — L97516 Non-pressure chronic ulcer of other part of right foot with bone involvement without evidence of necrosis: Secondary | ICD-10-CM | POA: Insufficient documentation

## 2018-02-13 DIAGNOSIS — K219 Gastro-esophageal reflux disease without esophagitis: Secondary | ICD-10-CM | POA: Insufficient documentation

## 2018-02-13 DIAGNOSIS — Z79890 Hormone replacement therapy (postmenopausal): Secondary | ICD-10-CM | POA: Insufficient documentation

## 2018-02-13 DIAGNOSIS — Z7902 Long term (current) use of antithrombotics/antiplatelets: Secondary | ICD-10-CM | POA: Insufficient documentation

## 2018-02-13 DIAGNOSIS — L97909 Non-pressure chronic ulcer of unspecified part of unspecified lower leg with unspecified severity: Secondary | ICD-10-CM

## 2018-02-13 DIAGNOSIS — E11621 Type 2 diabetes mellitus with foot ulcer: Secondary | ICD-10-CM | POA: Diagnosis not present

## 2018-02-13 DIAGNOSIS — E1169 Type 2 diabetes mellitus with other specified complication: Secondary | ICD-10-CM | POA: Insufficient documentation

## 2018-02-13 DIAGNOSIS — E785 Hyperlipidemia, unspecified: Secondary | ICD-10-CM | POA: Diagnosis not present

## 2018-02-13 DIAGNOSIS — E039 Hypothyroidism, unspecified: Secondary | ICD-10-CM | POA: Diagnosis not present

## 2018-02-13 DIAGNOSIS — Z8673 Personal history of transient ischemic attack (TIA), and cerebral infarction without residual deficits: Secondary | ICD-10-CM | POA: Insufficient documentation

## 2018-02-13 DIAGNOSIS — M869 Osteomyelitis, unspecified: Secondary | ICD-10-CM | POA: Diagnosis not present

## 2018-02-13 DIAGNOSIS — E1122 Type 2 diabetes mellitus with diabetic chronic kidney disease: Secondary | ICD-10-CM | POA: Diagnosis not present

## 2018-02-13 DIAGNOSIS — I129 Hypertensive chronic kidney disease with stage 1 through stage 4 chronic kidney disease, or unspecified chronic kidney disease: Secondary | ICD-10-CM | POA: Diagnosis not present

## 2018-02-13 DIAGNOSIS — E1151 Type 2 diabetes mellitus with diabetic peripheral angiopathy without gangrene: Secondary | ICD-10-CM | POA: Insufficient documentation

## 2018-02-13 DIAGNOSIS — N189 Chronic kidney disease, unspecified: Secondary | ICD-10-CM | POA: Insufficient documentation

## 2018-02-13 DIAGNOSIS — N183 Chronic kidney disease, stage 3 (moderate): Secondary | ICD-10-CM | POA: Diagnosis not present

## 2018-02-13 DIAGNOSIS — I739 Peripheral vascular disease, unspecified: Secondary | ICD-10-CM

## 2018-02-13 DIAGNOSIS — Z79899 Other long term (current) drug therapy: Secondary | ICD-10-CM | POA: Insufficient documentation

## 2018-02-13 DIAGNOSIS — I70238 Atherosclerosis of native arteries of right leg with ulceration of other part of lower right leg: Secondary | ICD-10-CM | POA: Diagnosis not present

## 2018-02-13 DIAGNOSIS — L97519 Non-pressure chronic ulcer of other part of right foot with unspecified severity: Secondary | ICD-10-CM | POA: Diagnosis not present

## 2018-02-13 DIAGNOSIS — I96 Gangrene, not elsewhere classified: Secondary | ICD-10-CM | POA: Diagnosis not present

## 2018-02-13 DIAGNOSIS — I2510 Atherosclerotic heart disease of native coronary artery without angina pectoris: Secondary | ICD-10-CM | POA: Insufficient documentation

## 2018-02-13 DIAGNOSIS — Z7982 Long term (current) use of aspirin: Secondary | ICD-10-CM | POA: Insufficient documentation

## 2018-02-13 HISTORY — PX: AMPUTATION: SHX166

## 2018-02-13 LAB — GLUCOSE, CAPILLARY
GLUCOSE-CAPILLARY: 111 mg/dL — AB (ref 70–99)
Glucose-Capillary: 116 mg/dL — ABNORMAL HIGH (ref 70–99)

## 2018-02-13 SURGERY — AMPUTATION, FOOT, RAY
Anesthesia: Monitor Anesthesia Care | Laterality: Right

## 2018-02-13 MED ORDER — EPHEDRINE SULFATE 50 MG/ML IJ SOLN
INTRAMUSCULAR | Status: DC | PRN
  Administered 2018-02-13: 10 mg via INTRAVENOUS

## 2018-02-13 MED ORDER — BUPIVACAINE HCL (PF) 0.5 % IJ SOLN
INTRAMUSCULAR | Status: AC
  Filled 2018-02-13: qty 30

## 2018-02-13 MED ORDER — DEXMEDETOMIDINE HCL 200 MCG/2ML IV SOLN
INTRAVENOUS | Status: DC | PRN
  Administered 2018-02-13: 8 ug via INTRAVENOUS
  Administered 2018-02-13: 4 ug via INTRAVENOUS

## 2018-02-13 MED ORDER — CEFAZOLIN SODIUM-DEXTROSE 2-4 GM/100ML-% IV SOLN
INTRAVENOUS | Status: AC
  Filled 2018-02-13: qty 100

## 2018-02-13 MED ORDER — PROPOFOL 10 MG/ML IV BOLUS
INTRAVENOUS | Status: DC | PRN
  Administered 2018-02-13: 20 mg via INTRAVENOUS
  Administered 2018-02-13: 40 mg via INTRAVENOUS
  Administered 2018-02-13: 20 mg via INTRAVENOUS

## 2018-02-13 MED ORDER — LIDOCAINE HCL 1 % IJ SOLN
INTRAMUSCULAR | Status: DC | PRN
  Administered 2018-02-13: 8 mL

## 2018-02-13 MED ORDER — ONDANSETRON HCL 4 MG/2ML IJ SOLN
INTRAMUSCULAR | Status: DC | PRN
  Administered 2018-02-13: 4 mg via INTRAVENOUS

## 2018-02-13 MED ORDER — SODIUM CHLORIDE 0.9 % IV SOLN
INTRAVENOUS | Status: DC
  Administered 2018-02-13: 14:00:00 via INTRAVENOUS

## 2018-02-13 MED ORDER — TRAMADOL HCL 50 MG PO TABS: 50.0000 mg | ORAL_TABLET | Freq: Four times a day (QID) | ORAL | 0 refills | Status: DC | PRN

## 2018-02-13 MED ORDER — BACITRACIN ZINC 500 UNIT/GM EX OINT
TOPICAL_OINTMENT | CUTANEOUS | Status: AC
  Filled 2018-02-13: qty 28.35

## 2018-02-13 MED ORDER — BUPIVACAINE HCL (PF) 0.5 % IJ SOLN
INTRAMUSCULAR | Status: DC | PRN
  Administered 2018-02-13: 8 mL

## 2018-02-13 MED ORDER — LIDOCAINE HCL (PF) 1 % IJ SOLN
INTRAMUSCULAR | Status: AC
  Filled 2018-02-13: qty 30

## 2018-02-13 SURGICAL SUPPLY — 27 items
BANDAGE ELASTIC 6 LF NS (GAUZE/BANDAGES/DRESSINGS) ×1 IMPLANT
BNDG CMPR MED 5X6 ELC HKLP NS (GAUZE/BANDAGES/DRESSINGS)
BNDG GAUZE 4.5X4.1 6PLY STRL (MISCELLANEOUS) ×6 IMPLANT
BRUSH SCRUB EZ  4% CHG (MISCELLANEOUS) ×2
BRUSH SCRUB EZ 4% CHG (MISCELLANEOUS) ×1 IMPLANT
CANISTER SUCT 1200ML W/VALVE (MISCELLANEOUS) ×3 IMPLANT
CHLORAPREP W/TINT 26ML (MISCELLANEOUS) ×3 IMPLANT
ELECT CAUTERY BLADE 6.4 (BLADE) ×1 IMPLANT
ELECT REM PT RETURN 9FT ADLT (ELECTROSURGICAL) ×3
ELECTRODE REM PT RTRN 9FT ADLT (ELECTROSURGICAL) ×1 IMPLANT
GAUZE PETRO XEROFOAM 1X8 (MISCELLANEOUS) ×3 IMPLANT
GAUZE SPONGE 4X4 12PLY STRL (GAUZE/BANDAGES/DRESSINGS) ×3 IMPLANT
GLOVE BIO SURGEON STRL SZ7 (GLOVE) ×3 IMPLANT
GOWN STRL REUS W/ TWL LRG LVL3 (GOWN DISPOSABLE) ×1 IMPLANT
GOWN STRL REUS W/ TWL XL LVL3 (GOWN DISPOSABLE) ×1 IMPLANT
GOWN STRL REUS W/TWL LRG LVL3 (GOWN DISPOSABLE) ×3
GOWN STRL REUS W/TWL XL LVL3 (GOWN DISPOSABLE) ×3
HANDLE YANKAUER SUCT BULB TIP (MISCELLANEOUS) ×1 IMPLANT
KIT TURNOVER KIT A (KITS) ×1 IMPLANT
LABEL OR SOLS (LABEL) ×3 IMPLANT
NS IRRIG 1000ML POUR BTL (IV SOLUTION) ×3 IMPLANT
PACK EXTREMITY ARMC (MISCELLANEOUS) ×3 IMPLANT
PAD PREP 24X41 OB/GYN DISP (PERSONAL CARE ITEMS) ×3 IMPLANT
SOL PREP PVP 2OZ (MISCELLANEOUS) ×3
SOLUTION PREP PVP 2OZ (MISCELLANEOUS) ×1 IMPLANT
SPONGE LAP 18X18 RF (DISPOSABLE) ×1 IMPLANT
STOCKINETTE BIAS CUT 6 980064 (GAUZE/BANDAGES/DRESSINGS) ×1 IMPLANT

## 2018-02-13 NOTE — H&P (Signed)
[facility] History & Physical Update  The patient was interviewed and re-examined.  The patient's previous History and Physical has been reviewed and is unchanged.  There is no change in the plan of care. We plan to proceed with the scheduled procedure.  Leotis Pain, MD  02/13/2018, 3:24 PM

## 2018-02-13 NOTE — Discharge Instructions (Signed)

## 2018-02-13 NOTE — Op Note (Signed)
  OPERATIVE NOTE   PROCEDURE: Right second toe Ray amputation  PRE-OPERATIVE DIAGNOSIS: Right toe ulceration with exposed bone  POST-OPERATIVE DIAGNOSIS: same as above  SURGEON: Leotis Pain, MD  ASSISTANT(S): Hezzie Bump, PA-C  ANESTHESIA: general  ESTIMATED BLOOD LOSS: 5 cc  FINDING(S): none  SPECIMEN(S): right second toe and metatarsal head  INDICATIONS:  Patient is a 84 y.o.female who presents with right second toe ulceration with significant exposed bone. The patient is scheduled for digital amputation. I discussed in depth with the patient the risks, benefits, and alternatives to this procedure. The patient is aware that the risk of this operation included but are not limited to: bleeding, infection, myocardial infarction, stroke, death, failure to heal amputation wound, and possible need for more proximal amputation. The patient is aware of the risks and agrees proceed forward with the procedure. An assistant was present during the procedure to help facilitate the exposure and expedite the procedure.   DESCRIPTION: After full informed written consent was obtained from the patient, the patient was brought back to the operating room, and placed supine upon the operating table. Prior to induction, the patient received IV antibiotics. The patient was then prepped and draped in the standard fashion. Curvilinear incision was made at the base of the right second toe. We dissected down to the bone with electrocautery and sharp dissection. The digital vessels were controlled with electrocautery. The right second toe was removed its entirety with dissection with electrocautery and blunt dissection. The metatarsal head was taken back with small bone cutter and Ronjours to allow tension-free closure. The wound was then closed with figure-of-eight 2-0 Vicryl and several 3-0 nylons. Sterile dressing was placed. The assistant provided retraction and mobilization to help facilitate  exposure and expedite the procedure throughout the entire procedure.  This included following suture, using retractors, and optimizing lighting.  COMPLICATIONS: none  CONDITION: stable   Leotis Pain, MD 02/13/2018 5:07 PM

## 2018-02-13 NOTE — Transfer of Care (Signed)
Immediate Anesthesia Transfer of Care Note  Patient: Ashley [name]  Procedure(s) Performed: AMPUTATION RAY ( 2ND TOE ) (Right )  Patient Location: PACU  Anesthesia Type:MAC  Level of Consciousness: awake, alert  and oriented  Airway & Oxygen Therapy: Patient connected to nasal cannula oxygen  Post-op Assessment: Post -op Vital signs reviewed and stable  Post vital signs: stable  Last Vitals:  Vitals Value Taken Time  BP 146/67 02/13/2018  5:26 PM  Temp    Pulse 85 02/13/2018  5:26 PM  Resp    SpO2 98 % 02/13/2018  5:26 PM    Last Pain:  Vitals:   02/13/18 1341  TempSrc: Tympanic  PainSc: 0-No pain      Patients Stated Pain Goal: 0 (44/81/85 6314)  Complications: No apparent anesthesia complications

## 2018-02-13 NOTE — Anesthesia Post-op Follow-up Note (Signed)
Anesthesia QCDR form completed.        

## 2018-02-13 NOTE — Anesthesia Preprocedure Evaluation (Signed)
Anesthesia Evaluation  Patient identified by MRN, date of birth, ID band Patient awake    Reviewed: Allergy & Precautions, H&P , NPO status , reviewed documented beta blocker date and time   Airway Mallampati: II  TM Distance: >3 FB Neck ROM: limited    Dental  (+) Upper Dentures, Lower Dentures   Pulmonary    Pulmonary exam normal        Cardiovascular hypertension, + CAD and + Peripheral Vascular Disease  Normal cardiovascular exam  09/2016 ECHO Study Conclusions  - Left ventricle: The cavity size was mildly dilated. Wall   thickness was increased in a pattern of moderate LVH. There was   moderate concentric hypertrophy. Systolic function was moderately   reduced. The estimated ejection fraction was in the range of 35%   to 40%. Wall motion was normal; there were no regional wall   motion abnormalities. Doppler parameters are consistent with   abnormal left ventricular relaxation (grade 1 diastolic   dysfunction). - Aortic valve: Valve area (Vmax): 1.14 cm^2. - Mitral valve: There was moderate regurgitation. Valve area by   pressure half-time: 1.62 cm^2. - Right ventricle: The cavity size was mildly dilated. Wall   thickness was normal.  Impressions:  - Mild to moderately reduced overall left ventricular function   Moderate concentric LVH   Lateral inferior hypokinesis   Mild diastolic dysfunction   Mild left ventricular enlargement   Ejection fraction between 40-45%   moderate mitral regurgitation   mildly reduced right ventricular function. There was no evidence   of a vegetation.   Neuro/Psych CVA    GI/Hepatic GERD  Controlled,  Endo/Other  diabetesHypothyroidism   Renal/GU Renal disease     Musculoskeletal  (+) Arthritis ,   Abdominal   Peds  Hematology   Anesthesia Other Findings Past Medical History: 11/21/2016: Acute gouty arthritis 09/04/2016: Aortic atherosclerosis ([place]) 11/21/2016:  Carotid artery disease ([place]) 12/03/2015: Carotid stenosis     Comment:  Advanced heavily calcified atherosclerotic disease at               both carotid bifurcations. Stenoses measured at 70%               affecting the right proximal ICA and 80% affecting the               left proximal ICA. No date: Chronic kidney disease 09/04/2016: Coronary atherosclerosis No date: Diverticulitis 09/04/2016: DM (diabetes mellitus), type 2 ([place]) No date: GERD (gastroesophageal reflux disease) No date: Gout 11/21/2016: HTN, goal below 140/90 06/20/2016: Hyperlipidemia 11/21/2016: Hypothyroidism, unspecified 11/21/2016: Primary osteoarthritis     Comment:  involving multiple joints No date: Stroke [facility])  Past Surgical History: No date: CATARACT EXTRACTION; Bilateral No date: COLONOSCOPY No date: Finger crystal removal     Comment:  right 3rd 01/06/2018: LOWER EXTREMITY ANGIOGRAPHY; Right     Comment:  Procedure: LOWER EXTREMITY ANGIOGRAPHY;  Surgeon: [name], MD;  Location: [facility];  Service:               Cardiovascular;  Laterality: Right; No date: toe crystal removal     Comment:  right 1st toe     Reproductive/Obstetrics                             Anesthesia Physical Anesthesia Plan  ASA: IV  Anesthesia Plan:  MAC   Post-op Pain Management:    Induction: Intravenous  PONV Risk Score and Plan: Treatment may vary due to age or medical condition, TIVA and Ondansetron  Airway Management Planned: Nasal Cannula and Natural Airway  Additional Equipment:   Intra-op Plan:   Post-operative Plan:   Informed Consent: I have reviewed the patients History and Physical, chart, labs and discussed the procedure including the risks, benefits and alternatives for the proposed anesthesia with the patient or authorized representative who has indicated his/her understanding and acceptance.   Dental Advisory Given  Plan Discussed with:  CRNA  Anesthesia Plan Comments:         Anesthesia Quick Evaluation

## 2018-02-14 ENCOUNTER — Encounter: Payer: Self-pay | Admitting: Vascular Surgery

## 2018-02-14 LAB — TYPE AND SCREEN
ABO/RH(D): B POS
Antibody Screen: NEGATIVE
UNIT DIVISION: 0
Unit division: 0

## 2018-02-14 LAB — BPAM RBC
BLOOD PRODUCT EXPIRATION DATE: 201910162359
BLOOD PRODUCT EXPIRATION DATE: 201910182359
UNIT TYPE AND RH: 5100
Unit Type and Rh: 5100

## 2018-02-14 NOTE — Anesthesia Postprocedure Evaluation (Signed)
Anesthesia Post Note  Patient: Ashley [name]  Procedure(s) Performed: AMPUTATION RAY ( 2ND TOE ) (Right )  Patient location during evaluation: PACU Anesthesia Type: MAC Level of consciousness: awake and alert and oriented Pain management: pain level controlled Vital Signs Assessment: post-procedure vital signs reviewed and stable Respiratory status: spontaneous breathing Cardiovascular status: blood pressure returned to baseline Anesthetic complications: no     Last Vitals:  Vitals:   02/13/18 1751 02/13/18 1800  BP: (!) 179/70 (!) 160/59  Pulse: 76 72  Resp: 20 18  Temp: 36.4 C (!) 35.9 C  SpO2: 99% 100%    Last Pain:  Vitals:   02/13/18 1800  TempSrc: Temporal  PainSc: 0-No pain                 Floria Brandau

## 2018-02-17 DIAGNOSIS — L97311 Non-pressure chronic ulcer of right ankle limited to breakdown of skin: Secondary | ICD-10-CM | POA: Diagnosis not present

## 2018-02-17 DIAGNOSIS — I739 Peripheral vascular disease, unspecified: Secondary | ICD-10-CM | POA: Diagnosis not present

## 2018-02-17 LAB — SURGICAL PATHOLOGY

## 2018-02-19 ENCOUNTER — Encounter

## 2018-02-19 ENCOUNTER — Encounter (INDEPENDENT_AMBULATORY_CARE_PROVIDER_SITE_OTHER): Payer: Medicare Other

## 2018-02-20 ENCOUNTER — Encounter: Payer: Self-pay | Admitting: Adult Health

## 2018-02-20 ENCOUNTER — Encounter: Payer: Medicare Other | Admitting: Physician Assistant

## 2018-02-20 ENCOUNTER — Non-Acute Institutional Stay (SKILLED_NURSING_FACILITY): Payer: Medicare Other | Admitting: Adult Health

## 2018-02-20 DIAGNOSIS — E559 Vitamin D deficiency, unspecified: Secondary | ICD-10-CM

## 2018-02-20 DIAGNOSIS — I129 Hypertensive chronic kidney disease with stage 1 through stage 4 chronic kidney disease, or unspecified chronic kidney disease: Secondary | ICD-10-CM | POA: Diagnosis not present

## 2018-02-20 DIAGNOSIS — E1122 Type 2 diabetes mellitus with diabetic chronic kidney disease: Secondary | ICD-10-CM | POA: Diagnosis not present

## 2018-02-20 DIAGNOSIS — E785 Hyperlipidemia, unspecified: Secondary | ICD-10-CM | POA: Diagnosis not present

## 2018-02-20 DIAGNOSIS — E034 Atrophy of thyroid (acquired): Secondary | ICD-10-CM

## 2018-02-20 DIAGNOSIS — L97212 Non-pressure chronic ulcer of right calf with fat layer exposed: Secondary | ICD-10-CM | POA: Diagnosis not present

## 2018-02-20 DIAGNOSIS — L97909 Non-pressure chronic ulcer of unspecified part of unspecified lower leg with unspecified severity: Secondary | ICD-10-CM

## 2018-02-20 DIAGNOSIS — I739 Peripheral vascular disease, unspecified: Secondary | ICD-10-CM

## 2018-02-20 DIAGNOSIS — G4700 Insomnia, unspecified: Secondary | ICD-10-CM

## 2018-02-20 DIAGNOSIS — E11622 Type 2 diabetes mellitus with other skin ulcer: Secondary | ICD-10-CM | POA: Diagnosis not present

## 2018-02-20 DIAGNOSIS — L97519 Non-pressure chronic ulcer of other part of right foot with unspecified severity: Secondary | ICD-10-CM | POA: Diagnosis not present

## 2018-02-20 DIAGNOSIS — I70232 Atherosclerosis of native arteries of right leg with ulceration of calf: Secondary | ICD-10-CM | POA: Diagnosis not present

## 2018-02-20 NOTE — Progress Notes (Signed)
Location:  [facility] Room Number: 876O Place of Service:  SNF [phone]) Provider:  Durenda Age, NP  Patient Care Team: Kirk Ruths, MD as PCP - General (Internal Medicine)  Extended Emergency Contact Information Primary Emergency Contact: [place] Phone: [phone] Relation: Niece Secondary Emergency Contact: Charolotte Eke [place] Phone: [phone] Relation: Nephew  Code Status:  DNR  Goals of care: Advanced Directive information Advanced Directives 02/13/2018  Does Patient Have a Medical Advance Directive? Yes  Type of Advance Directive [place]  Does patient want to make changes to medical advance directive? No - Patient declined  Copy of [place] in Chart? Yes  Pre-existing out of facility DNR order (yellow form or pink MOST form) -     Chief Complaint  Patient presents with  . Medical Management of Chronic Issues    Routine Visit    HPI:  Ashley [name] is a 84 y.o. female seen today for medical management of chronic diseases. She has PMH of stroke, carotid stenosis, DM, and diverticulitis. She was seen in the room today. She has PVD of lower extremity with ulceration. Revascularization done on 01/06/18. Her right 2nd toe did not improve and became dark. She had a recent amputation of right 2nd toe (02/13/18) and continues to have chronic right ankle wound. She follows up with wound care center.    Past Medical History:  Diagnosis Date  . Acute gouty arthritis 11/21/2016  . Aortic atherosclerosis ([place]) 09/04/2016  . Carotid artery disease ([place]) 11/21/2016  . Carotid stenosis 12/03/2015   Advanced heavily calcified atherosclerotic disease at both carotid bifurcations. Stenoses measured at 70% affecting the right proximal ICA and 80% affecting the left proximal ICA.  Marland Kitchen Chronic kidney disease   . Coronary atherosclerosis 09/04/2016  .  Diverticulitis   . DM (diabetes mellitus), type 2 ([place]) 09/04/2016  . GERD (gastroesophageal reflux disease)   . Gout   . HTN, goal below 140/90 11/21/2016  . Hyperlipidemia 06/20/2016  . Hypothyroidism, unspecified 11/21/2016  . Primary osteoarthritis 11/21/2016   involving multiple joints  . Stroke [facility])    Past Surgical History:  Procedure Laterality Date  . AMPUTATION Right 02/13/2018   Procedure: AMPUTATION RAY ( 2ND TOE );  Surgeon: [name], MD;  Location: [facility];  Service: Vascular;  Laterality: Right;  . CATARACT EXTRACTION Bilateral   . COLONOSCOPY    . Finger crystal removal     right 3rd  . LOWER EXTREMITY ANGIOGRAPHY Right 01/06/2018   Procedure: LOWER EXTREMITY ANGIOGRAPHY;  Surgeon: [name], MD;  Location: [facility] [place];  Service: Cardiovascular;  Laterality: Right;  . toe crystal removal     right 1st toe    No Known Allergies    Review of Systems  GENERAL: No change in appetite, no fatigue, no weight changes, no fever, chills or weakness                              MOUTH and THROAT: Denies oral discomfort, gingival pain or bleeding RESPIRATORY: no cough, SOB, DOE, wheezing, hemoptysis CARDIAC: No chest pain, edema or palpitations GI: No abdominal pain, diarrhea, constipation, heart burn, nausea or vomiting GU: Denies dysuria, frequency, hematuria, incontinence, or discharge PSYCHIATRIC: Denies feelings of depression or anxiety. No report of hallucinations, insomnia, paranoia, or agitation    Immunization History  Administered Date(s)  Administered  . Influenza,inj,Quad PF,6+ Mos 01/28/2015  . Influenza-Unspecified 02/14/2016  . PPD Test 12/28/2016  . Pneumococcal Polysaccharide-23 01/21/2018  . Td 07/08/2017   Pertinent  Health Maintenance Due  Topic Date Due  . FOOT EXAM  03/27/1929  . OPHTHALMOLOGY EXAM  03/27/1929  . INFLUENZA VACCINE  12/26/2017  . DEXA SCAN  05/29/2023 (Originally 03/26/1986)  . HEMOGLOBIN A1C  07/23/2018  .  URINE MICROALBUMIN  01/22/2019  . PNA vac Low Risk Adult (2 of 2 - PCV13) 01/22/2019   Fall Risk  09/04/2016  Falls in the past year? No      Vitals:   02/20/18 1259  BP: (!) 146/77  Pulse: 75  Resp: 18  Temp: 98 F (36.7 C)  TempSrc: Oral  SpO2: 99%  Weight: 137 lb 11.2 oz (62.5 kg)  Height: 5\' 2"  (1.575 m)   Body mass index is 25.19 kg/m.  Physical Exam  GENERAL APPEARANCE: Well nourished. In no acute distress. Normal body habitus SKIN:  Right foot covered with kerlix dressing and ACE wrap MOUTH and THROAT: Lips are without lesions. Oral mucosa is moist and without lesions. Tongue is normal in shape, size, and color and without lesions RESPIRATORY: Breathing is even & unlabored, BS CTAB CARDIAC: RRR, no murmur,no extra heart sounds, no edema GI: Abdomen soft, normal BS, no masses, no tenderness EXTREMITIES:  Able to move X 4 extremities NEUROLOGICAL: There is no tremor. Speech is clear PSYCHIATRIC: Alert to self, disoriented to time and place. Affect and behavior are appropriate   Labs reviewed: Recent Labs    08/13/17 0700 08/14/17 0400 01/03/18 1225 02/06/18 1120  NA 139 140  --  134*  K 5.6* 5.3*  --  5.1  CL 110 109  --  101  CO2 22 22  --  24  GLUCOSE 110* 105*  --  169*  BUN 29* 32* 43* 33*  CREATININE 1.53* 1.51* 1.19* 1.25*  CALCIUM 8.9 8.9  --  9.3  MG 1.3*  --   --   --    Recent Labs    08/13/17 0700  AST 32  ALT 22  ALKPHOS 116  BILITOT 0.8  PROT 6.1*  ALBUMIN 3.2*   Recent Labs    08/13/17 0700 01/01/18 0610 02/06/18 1120  WBC 3.8 7.4 6.3  NEUTROABS 2.0 5.2 3.8  HGB 10.4* 10.8* 10.6*  HCT 31.1* 32.2* 30.8*  MCV 95.9 94.8 95.1  PLT 157 203 229   Lab Results  Component Value Date   TSH 2.753 01/01/2018   Lab Results  Component Value Date   HGBA1C 7.6 (H) 01/20/2018   Lab Results  Component Value Date   CHOL 112 08/13/2017   HDL 45 08/13/2017   LDLCALC 39 08/13/2017   LDLDIRECT 58.0 09/04/2016   TRIG 142 08/13/2017     CHOLHDL 2.5 08/13/2017      Assessment/Plan  1. Peripheral vascular disease of lower extremity with ulceration (HCC) - S/P revascularization on 8/12, S/P amputation of right 2nd toe, continue treatment to right ankle ulcer, follows up at wound clinic, continue tramadol 50 mg 1 tab every 6 hours as needed   2. Hypothyroidism due to acquired atrophy of thyroid -continue levothyroxine 88 mcg 1 tab daily Lab Results  Component Value Date   TSH 2.753 01/01/2018    3. Hyperlipidemia, unspecified hyperlipidemia type -continue Lipitor 10 mg 1 tab daily Lab Results  Component Value Date   CHOL 112 08/13/2017   HDL 45 08/13/2017   [place]  39 08/13/2017   LDLDIRECT 58.0 09/04/2016   TRIG 142 08/13/2017   CHOLHDL 2.5 08/13/2017    4. Insomnia, unspecified type - continue trazodone 50 mg 1 tab nightly  5. Vitamin D deficiency -continue vitamin D3 2000 units 1 tab daily     Family/ staff Communication:  Discussed plan of care with resident.  Labs/tests ordered:  None  Goals of care:   Long term / [place], NP [facility] [phone] (Monday-Friday 8:00 a.m. - 5:00 p.m.) [phone] (after hours)

## 2018-02-21 ENCOUNTER — Ambulatory Visit (INDEPENDENT_AMBULATORY_CARE_PROVIDER_SITE_OTHER): Payer: Medicare Other | Admitting: Nurse Practitioner

## 2018-02-21 ENCOUNTER — Encounter (INDEPENDENT_AMBULATORY_CARE_PROVIDER_SITE_OTHER): Payer: Self-pay | Admitting: Nurse Practitioner

## 2018-02-21 VITALS — BP 171/76 | HR 78 | Resp 16 | Ht 62.0 in | Wt 137.7 lb

## 2018-02-21 DIAGNOSIS — N183 Chronic kidney disease, stage 3 unspecified: Secondary | ICD-10-CM

## 2018-02-21 DIAGNOSIS — I6523 Occlusion and stenosis of bilateral carotid arteries: Secondary | ICD-10-CM

## 2018-02-21 DIAGNOSIS — E1122 Type 2 diabetes mellitus with diabetic chronic kidney disease: Secondary | ICD-10-CM

## 2018-02-21 DIAGNOSIS — K219 Gastro-esophageal reflux disease without esophagitis: Secondary | ICD-10-CM

## 2018-02-21 DIAGNOSIS — L97909 Non-pressure chronic ulcer of unspecified part of unspecified lower leg with unspecified severity: Secondary | ICD-10-CM

## 2018-02-21 DIAGNOSIS — I739 Peripheral vascular disease, unspecified: Secondary | ICD-10-CM

## 2018-02-23 NOTE — Progress Notes (Signed)
FLORRIE, RAMIRES ([id number]) Visit Report for 02/20/2018 Arrival Information Details Patient Name: Kalafut, Ashby P. Date of Service: 02/20/2018 1:30 PM Medical Record Number: [id number] Patient Account Number: [id number] Date of Birth/Sex: 12/11/1918 (84 y.o. F) Treating RN: Secundino Ginger Primary Care Andrell Tallman: Frazier Richards Other Clinician: Referring Levis Nazir: Frazier Richards Treating Brieanne Mignone/Extender: Melburn Hake, HOYT Weeks in Treatment: 3 Visit Information History Since Last Visit Added or deleted any medications: No Patient Arrived: Wheel Chair Any new allergies or adverse reactions: No Arrival Time: 13:33 Had a fall or experienced change in No Accompanied By: nephew activities of daily living that may affect Transfer Assistance: EasyPivot Patient risk of falls: Lift Signs or symptoms of abuse/neglect since last visito No Patient Identification Verified: Yes Hospitalized since last visit: No Secondary Verification Process Yes Implantable device outside of the clinic excluding No Completed: cellular tissue based products placed in the center Patient Has Alerts: Yes since last visit: Patient Alerts: ABI [place] BILATERAL Has Dressing in Place as Prescribed: Yes AVVS 01/17/18 Pain Present Now: No TBI L .55 R .25 Electronic Signature(s) Signed: 02/20/2018 2:29:34 PM By: Secundino Ginger Entered By: Secundino Ginger on 02/20/2018 13:33:55 Trew, Nioma P. ([id number]) -------------------------------------------------------------------------------- Clinic Level of Care Assessment Details Patient Name: Salone, Jadene P. Date of Service: 02/20/2018 1:30 PM Medical Record Number: [id number] Patient Account Number: [id number] Date of Birth/Sex: 05/25/1919 (84 y.o. F) Treating RN: Roger Shelter Primary Care Lalla Laham: Frazier Richards Other Clinician: Referring Senaida Chilcote: Frazier Richards Treating Bascom Biel/Extender: Melburn Hake, HOYT Weeks in Treatment: 3 Clinic Level of Care Assessment Items TOOL 4  Quantity Score X - Use when only an EandM is performed on FOLLOW-UP visit 1 0 ASSESSMENTS - Nursing Assessment / Reassessment X - Reassessment of Co-morbidities (includes updates in patient status) 1 10 X- 1 5 Reassessment of Adherence to Treatment Plan ASSESSMENTS - Wound and Skin Assessment / Reassessment X - Simple Wound Assessment / Reassessment - one wound 1 5 []  - 0 Complex Wound Assessment / Reassessment - multiple wounds []  - 0 Dermatologic / Skin Assessment (not related to wound area) ASSESSMENTS - Focused Assessment []  - Circumferential Edema Measurements - multi extremities 0 []  - 0 Nutritional Assessment / Counseling / Intervention []  - 0 Lower Extremity Assessment (monofilament, tuning fork, pulses) []  - 0 Peripheral Arterial Disease Assessment (using hand held doppler) ASSESSMENTS - Ostomy and/or Continence Assessment and Care []  - Incontinence Assessment and Management 0 []  - 0 Ostomy Care Assessment and Management (repouching, etc.) PROCESS - Coordination of Care X - Simple Patient / Family Education for ongoing care 1 15 []  - 0 Complex (extensive) Patient / Family Education for ongoing care []  - 0 Staff obtains Programmer, systems, Records, Test Results / Process Orders []  - 0 Staff telephones HHA, Nursing Homes / Clarify orders / etc []  - 0 Routine Transfer to another Facility (non-emergent condition) []  - 0 Routine Hospital Admission (non-emergent condition) []  - 0 New Admissions / Biomedical engineer / Ordering NPWT, Apligraf, etc. []  - 0 Emergency Hospital Admission (emergent condition) X- 1 10 Simple Discharge Coordination Corso, Tala P. ([id number]) []  - 0 Complex (extensive) Discharge Coordination PROCESS - Special Needs []  - Pediatric / Minor Patient Management 0 []  - 0 Isolation Patient Management []  - 0 Hearing / Language / Visual special needs []  - 0 Assessment of Community assistance (transportation, D/C planning, etc.) []  - 0 Additional  assistance / Altered mentation []  - 0 Support Surface(s) Assessment (bed, cushion, seat, etc.) INTERVENTIONS - Wound Cleansing / Measurement X - Simple Wound  Cleansing - one wound 1 5 []  - 0 Complex Wound Cleansing - multiple wounds X- 1 5 Wound Imaging (photographs - any number of wounds) []  - 0 Wound Tracing (instead of photographs) X- 1 5 Simple Wound Measurement - one wound []  - 0 Complex Wound Measurement - multiple wounds INTERVENTIONS - Wound Dressings X - Small Wound Dressing one or multiple wounds 1 10 []  - 0 Medium Wound Dressing one or multiple wounds []  - 0 Large Wound Dressing one or multiple wounds []  - 0 Application of Medications - topical []  - 0 Application of Medications - injection INTERVENTIONS - Miscellaneous []  - External ear exam 0 []  - 0 Specimen Collection (cultures, biopsies, blood, body fluids, etc.) []  - 0 Specimen(s) / Culture(s) sent or taken to Lab for analysis []  - 0 Patient Transfer (multiple staff / Civil Service fast streamer / Similar devices) []  - 0 Simple Staple / Suture removal (25 or less) []  - 0 Complex Staple / Suture removal (26 or more) []  - 0 Hypo / Hyperglycemic Management (close monitor of Blood Glucose) []  - 0 Ankle / Brachial Index (ABI) - do not check if billed separately X- 1 5 Vital Signs Manansala, Lee-Anne P. ([id number]) Has the patient been seen at the hospital within the last three years: Yes Total Score: 75 Level Of Care: New/Established - Level 2 Electronic Signature(s) Signed: 02/21/2018 4:22:48 PM By: Roger Shelter Entered By: Roger Shelter on 02/20/2018 14:05:26 Uriostegui, Lucrecia P. ([id number]) -------------------------------------------------------------------------------- Encounter Discharge Information Details Patient Name: Hoelzer, Lovie P. Date of Service: 02/20/2018 1:30 PM Medical Record Number: [id number] Patient Account Number: [id number] Date of Birth/Sex: [birth year] (84 y.o. F) Treating RN: Roger Shelter Primary  Care Zaccai [name]: Frazier Richards Other Clinician: Referring Cheyenne Bordeaux: Frazier Richards Treating Vianny Schraeder/Extender: Melburn Hake, HOYT Weeks in Treatment: 3 Encounter Discharge Information Items Discharge Condition: Stable Ambulatory Status: Wheelchair Discharge Destination: Home Transportation: Private Auto Schedule Follow-up Appointment: Yes Clinical Summary of Care: Electronic Signature(s) Signed: 02/21/2018 4:22:48 PM By: Roger Shelter Entered By: Roger Shelter on 02/20/2018 14:06:56 Lehnert, Aden P. ([id number]) -------------------------------------------------------------------------------- Lower Extremity Assessment Details Patient Name: Feely, Jermani P. Date of Service: 02/20/2018 1:30 PM Medical Record Number: [id number] Patient Account Number: [id number] Date of Birth/Sex: [birth year] (84 y.o. F) Treating RN: Secundino Ginger Primary Care Antoneo Ghrist: Frazier Richards Other Clinician: Referring Rachard Isidro: Frazier Richards Treating Geno Sydnor/Extender: Melburn Hake, HOYT Weeks in Treatment: 3 Electronic Signature(s) Signed: 02/20/2018 2:29:34 PM By: Secundino Ginger Entered By: Secundino Ginger on 02/20/2018 13:43:52 Hornik, Vivien P. ([id number]) -------------------------------------------------------------------------------- Multi Wound Chart Details Patient Name: Mink, Jeimy P. Date of Service: 02/20/2018 1:30 PM Medical Record Number: [id number] Patient Account Number: [id number] Date of Birth/Sex: 05-09-1919 (84 y.o. F) Treating RN: Roger Shelter Primary Care Delayza Lungren: Frazier Richards Other Clinician: Referring Arjen Deringer: Frazier Richards Treating Adanya Sosinski/Extender: Melburn Hake, HOYT Weeks in Treatment: 3 Vital Signs Height(in): Pulse(bpm): 26 Weight(lbs): Blood Pressure(mmHg): 136/56 Body Mass Index(BMI): Temperature(F): 97.7 Respiratory Rate 18 (breaths/min): Photos: [1:No Photos] [N/A:N/A] Wound Location: Right Toe Second Right Lower Leg - Posterior N/A Wounding Event: Not  Known Not Known N/A Primary Etiology: Diabetic Wound/Ulcer of the Diabetic Wound/Ulcer of the N/A Lower Extremity Lower Extremity Secondary Etiology: N/A Arterial Insufficiency Ulcer N/A Comorbid History: Hypertension, Peripheral Hypertension, Peripheral N/A Arterial Disease, Type II Arterial Disease, Type II Diabetes, End Stage Renal Diabetes, End Stage Renal Disease Disease Date Acquired: 10/28/2017 10/28/2017 N/A Weeks of Treatment: 3 3 N/A Wound Status: Amputation Open N/A Pending Amputation on Yes Yes N/A Presentation: Measurements L x W x D 0x0x0 1.8x0.6x0.1  N/A (cm) Area (cm) : 0 0.848 N/A Volume (cm) : 0 0.085 N/A % Reduction in Area: 100.00% 87.10% N/A % Reduction in Volume: 100.00% 93.60% N/A Classification: Grade 2 Grade 1 N/A Exudate Amount: Small Small N/A Exudate Type: Serous Sanguinous N/A Exudate Color: amber red N/A Wound Margin: Flat and Intact Flat and Intact N/A Granulation Amount: None Present (0%) Large (67-100%) N/A Granulation Quality: N/A Red, Hyper-granulation N/A Necrotic Amount: None Present (0%) None Present (0%) N/A Exposed Structures: N/A Grosshans, Remmie P. ([id number]) Fascia: No Fat Layer (Subcutaneous Fat Layer (Subcutaneous Tissue) Exposed: Yes Tissue) Exposed: No Fascia: No Tendon: No Tendon: No Muscle: No Muscle: No Joint: No Joint: No Bone: No Bone: No Epithelialization: None None N/A Periwound Skin Texture: Excoriation: No Excoriation: No N/A Induration: No Induration: No Callus: No Callus: No Crepitus: No Crepitus: No Rash: No Rash: No Scarring: No Scarring: No Periwound Skin Moisture: Maceration: No Maceration: No N/A Dry/Scaly: No Dry/Scaly: No Periwound Skin Color: Erythema: Yes Atrophie Blanche: No N/A Atrophie Blanche: No Cyanosis: No Cyanosis: No Ecchymosis: No Ecchymosis: No Erythema: No Hemosiderin Staining: No Hemosiderin Staining: No Mottled: No Mottled: No Pallor: No Pallor: No Rubor: No Rubor:  No Erythema Location: Circumferential N/A N/A Temperature: No Abnormality No Abnormality N/A Tenderness on Palpation: Yes No N/A Wound Preparation: Ulcer Cleansing: Ulcer Cleansing: N/A Rinsed/Irrigated with Saline Rinsed/Irrigated with Saline Topical Anesthetic Applied: Topical Anesthetic Applied: Other: lidocaine 4% Other: lidocaine 4% Treatment Notes Electronic Signature(s) Signed: 02/21/2018 4:22:48 PM By: Roger Shelter Entered By: Roger Shelter on 02/20/2018 13:55:57 Baynes, Ahava P. ([id number]) -------------------------------------------------------------------------------- [place] Details Patient Name: Hillier, Norris P. Date of Service: 02/20/2018 1:30 PM Medical Record Number: [id number] Patient Account Number: [id number] Date of Birth/Sex: [birth year] (84 y.o. F) Treating RN: Roger Shelter Primary Care Aysiah Jurado: Frazier Richards Other Clinician: Referring Rivers Gassmann: Frazier Richards Treating Korrina Zern/Extender: Melburn Hake, HOYT Weeks in Treatment: 3 Active Inactive ` Orientation to the Wound Care Program Nursing Diagnoses: Knowledge deficit related to the wound healing center program Goals: Patient/caregiver will verbalize understanding of the [facility] Program Date Initiated: 01/30/2018 Target Resolution Date: 02/20/2018 Goal Status: Active Interventions: Provide education on orientation to the wound center Notes: ` Wound/Skin Impairment Nursing Diagnoses: Impaired tissue integrity Goals: Patient/caregiver will verbalize understanding of skin care regimen Date Initiated: 01/30/2018 Target Resolution Date: 02/19/2018 Goal Status: Active Ulcer/skin breakdown will have a volume reduction of 30% by week 4 Date Initiated: 01/30/2018 Target Resolution Date: 02/19/2018 Goal Status: Active Interventions: Assess patient/caregiver ability to obtain necessary supplies Assess patient/caregiver ability to perform ulcer/skin care regimen upon  admission and as needed Assess ulceration(s) every visit Treatment Activities: Skin care regimen initiated : 01/30/2018 Notes: Electronic Signature(s) Signed: 02/21/2018 4:22:48 PM By: Chauncey Reading, Tanea P. ([id number]) Entered By: Roger Shelter on 02/20/2018 13:55:49 Paye, Deandria P. ([id number]) -------------------------------------------------------------------------------- Pain Assessment Details Patient Name: Laning, Analisse P. Date of Service: 02/20/2018 1:30 PM Medical Record Number: [id number] Patient Account Number: [id number] Date of Birth/Sex: [birth year] (84 y.o. F) Treating RN: Secundino Ginger Primary Care Donnel Venuto: Frazier Richards Other Clinician: Referring Brieann Osinski: Frazier Richards Treating Nataleah Scioneaux/Extender: Melburn Hake, HOYT Weeks in Treatment: 3 Active Problems Location of Pain Severity and Description of Pain Patient Has Paino No Site Locations Pain Management and Medication Current Pain Management: Goals for Pain Management pt denies any pain at this time. Electronic Signature(s) Signed: 02/20/2018 2:29:34 PM By: Secundino Ginger Entered By: Secundino Ginger on 02/20/2018 13:34:15 Mena, Rhylan P. ([id number]) -------------------------------------------------------------------------------- Patient/Caregiver Education Details Patient Name: Villeda, Citlalli P.  Date of Service: 02/20/2018 1:30 PM Medical Record Number: [id number] Patient Account Number: [id number] Date of Birth/Gender: 03-Apr-1919 (84 y.o. F) Treating RN: Roger Shelter Primary Care Physician: Frazier Richards Other Clinician: Referring Physician: Frazier Richards Treating Physician/Extender: Melburn Hake, HOYT Weeks in Treatment: 3 Education Assessment Education Provided To: Patient Education Topics Provided Wound/Skin Impairment: Handouts: Caring for Your Ulcer Methods: Explain/Verbal Responses: State content correctly Electronic Signature(s) Signed: 02/21/2018 4:22:48 PM By: Roger Shelter Entered By:  Roger Shelter on 02/20/2018 14:07:05 Dazey, Samarrah P. ([id number]) -------------------------------------------------------------------------------- Wound Assessment Details Patient Name: Banke, Arlet P. Date of Service: 02/20/2018 1:30 PM Medical Record Number: [id number] Patient Account Number: [id number] Date of Birth/Sex: [birth year] (84 y.o. F) Treating RN: Roger Shelter Primary Care Anabeth Chilcott: Frazier Richards Other Clinician: Referring Taira Knabe: Frazier Richards Treating Asencion Loveday/Extender: Melburn Hake, HOYT Weeks in Treatment: 3 Wound Status Wound Number: 1 Primary Diabetic Wound/Ulcer of the Lower Extremity Etiology: Wound Location: Right Toe Second Wound Amputation Wounding Event: Not Known Status: Date Acquired: 10/28/2017 Outcome Toe/Ray Weeks Of Treatment: 3 Level: Clustered Wound: No Comorbid Hypertension, Peripheral Arterial Disease, Pending Amputation On Presentation History: Type II Diabetes, End Stage Renal Disease Wound Measurements Length: (cm) 0 % Reduct Width: (cm) 0 % Reduct Depth: (cm) 0 Epitheli Area: (cm) 0 Tunneli Volume: (cm) 0 Undermi ion in Area: 100% ion in Volume: 100% alization: None ng: No ning: No Wound Description Classification: Grade 2 Foul Odo Wound Margin: Flat and Intact Slough/F Exudate Amount: Small Exudate Type: Serous Exudate Color: amber r After Cleansing: No ibrino No Wound Bed Granulation Amount: None Present (0%) Exposed Structure Necrotic Amount: None Present (0%) Fascia Exposed: No Fat Layer (Subcutaneous Tissue) Exposed: No Tendon Exposed: No Muscle Exposed: No Joint Exposed: No Bone Exposed: No Periwound Skin Texture Texture Color No Abnormalities Noted: No No Abnormalities Noted: No Callus: No Atrophie Blanche: No Crepitus: No Cyanosis: No Excoriation: No Ecchymosis: No Induration: No Erythema: Yes Rash: No Erythema Location: Circumferential Scarring: No Hemosiderin Staining: No Mottled:  No Moisture Pallor: No No Abnormalities Noted: No Rubor: No Dry / Scaly: No Winebarger, Kimble P. ([id number]) Maceration: No Temperature / Pain Temperature: No Abnormality Tenderness on Palpation: Yes Wound Preparation Ulcer Cleansing: Rinsed/Irrigated with Saline Topical Anesthetic Applied: Other: lidocaine 4%, Electronic Signature(s) Signed: 02/21/2018 4:22:48 PM By: Roger Shelter Entered By: Roger Shelter on 02/20/2018 13:55:10 Meder, Edwina P. ([id number]) -------------------------------------------------------------------------------- Wound Assessment Details Patient Name: Thelen, Samya P. Date of Service: 02/20/2018 1:30 PM Medical Record Number: [id number] Patient Account Number: [id number] Date of Birth/Sex: 25-Feb-1919 (84 y.o. F) Treating RN: Secundino Ginger Primary Care Boden Stucky: Frazier Richards Other Clinician: Referring Dionel Archey: Frazier Richards Treating Corneshia Hines/Extender: Melburn Hake, HOYT Weeks in Treatment: 3 Wound Status Wound Number: 2 Primary Diabetic Wound/Ulcer of the Lower Extremity Etiology: Wound Location: Right Lower Leg - Posterior Secondary Arterial Insufficiency Ulcer Wounding Event: Not Known Etiology: Date Acquired: 10/28/2017 Wound Status: Open Weeks Of Treatment: 3 Comorbid Hypertension, Peripheral Arterial Disease, Clustered Wound: No History: Type II Diabetes, End Stage Renal Disease Pending Amputation On Presentation Photos Photo Uploaded By: Secundino Ginger on 02/20/2018 13:53:49 Wound Measurements Length: (cm) 1.8 % Reduction Width: (cm) 0.6 % Reduction Depth: (cm) 0.1 Epitheliali Area: (cm) 0.848 Tunneling: Volume: (cm) 0.085 Underminin in Area: 87.1% in Volume: 93.6% zation: None No g: No Wound Description Classification: Grade 1 Foul Odor A Wound Margin: Flat and Intact Slough/Fibr Exudate Amount: Small Exudate Type: Sanguinous Exudate Color: red fter Cleansing: No ino No Wound Bed Granulation Amount: Large (67-100%) Exposed  Structure Granulation Quality: Red,  Hyper-granulation Fascia Exposed: No Necrotic Amount: None Present (0%) Fat Layer (Subcutaneous Tissue) Exposed: Yes Tendon Exposed: No Muscle Exposed: No Joint Exposed: No Bone Exposed: No Periwound Skin Texture Brosseau, Rhetta P. ([id number]) Texture Color No Abnormalities Noted: No No Abnormalities Noted: No Callus: No Atrophie Blanche: No Crepitus: No Cyanosis: No Excoriation: No Ecchymosis: No Induration: No Erythema: No Rash: No Hemosiderin Staining: No Scarring: No Mottled: No Pallor: No Moisture Rubor: No No Abnormalities Noted: No Dry / Scaly: No Temperature / Pain Maceration: No Temperature: No Abnormality Wound Preparation Ulcer Cleansing: Rinsed/Irrigated with Saline Topical Anesthetic Applied: Other: lidocaine 4%, Treatment Notes Wound #2 (Right, Posterior Lower Leg) 1. Cleansed with: Clean wound with Normal Saline 2. Anesthetic Topical Lidocaine 4% cream to wound bed prior to debridement 4. Dressing Applied: Prisma Ag 5. Secondary Dressing Applied Bordered Foam Dressing Electronic Signature(s) Signed: 02/20/2018 2:29:34 PM By: Secundino Ginger Entered By: Secundino Ginger on 02/20/2018 13:42:47 Chimento, Shane P. ([id number]) -------------------------------------------------------------------------------- Vitals Details Patient Name: Biskup, Schuyler P. Date of Service: 02/20/2018 1:30 PM Medical Record Number: [id number] Patient Account Number: [id number] Date of Birth/Sex: February 08, 1919 (84 y.o. F) Treating RN: Secundino Ginger Primary Care Bernhardt Riemenschneider: Frazier Richards Other Clinician: Referring Liese Dizdarevic: Frazier Richards Treating Nathan Stallworth/Extender: Melburn Hake, HOYT Weeks in Treatment: 3 Vital Signs Time Taken: 13:34 Temperature (F): 97.7 Pulse (bpm): 71 Respiratory Rate (breaths/min): 18 Blood Pressure (mmHg): 136/56 Reference Range: 80 - 120 mg / dl Electronic Signature(s) Signed: 02/20/2018 2:29:34 PM By: Secundino Ginger Entered By: Secundino Ginger on 02/20/2018 13:35:32

## 2018-02-23 NOTE — Progress Notes (Signed)
CLARISA, DANSER ([id number]) Visit Report for 02/20/2018 Chief Complaint Document Details Patient Name: Sedano, Mahitha P. Date of Service: 02/20/2018 1:30 PM Medical Record Number: [id number] Patient Account Number: [id number] Date of Birth/Sex: [birth year] (84 y.o. F) Treating RN: Roger Shelter Primary Care Provider: Frazier Richards Other Clinician: Referring Provider: Frazier Richards Treating Provider/Extender: Melburn Hake, Zuha Dejonge Weeks in Treatment: 3 Information Obtained from: Patient Chief Complaint Right posterior calf Electronic Signature(s) Signed: 02/20/2018 3:07:42 PM By: Worthy Keeler PA-C Entered By: Worthy Keeler on 02/20/2018 13:28:57 Ghazarian, Amelia P. ([id number]) -------------------------------------------------------------------------------- HPI Details Patient Name: Egle, Patrisha P. Date of Service: 02/20/2018 1:30 PM Medical Record Number: [id number] Patient Account Number: [id number] Date of Birth/Sex: 02/27/1919 (84 y.o. F) Treating RN: Roger Shelter Primary Care Provider: Frazier Richards Other Clinician: Referring Provider: Frazier Richards Treating Provider/Extender: Melburn Hake, Rasheem Figiel Weeks in Treatment: 3 History of Present Illness HPI Description: 01/30/18-She is seen in initial evaluation for a right plantar calf ulcer. She is being treated for both the calf ulcer and a right second toe ulcer; the right second toe is planned for amputation on 9/19. The facility has been treating both ulcers with santyl. On 8/12 she underwent angiogram of the right lower extremity with angioplasty to the posterior tibial, right mid distal SFA, popliteal artery with stent placement to the right popliteal artery; post-procedure ABI-non-compressible bilaterally, RIGHT TBI 0.25, LEFT TBI 0.55. The right posterior calf ulcer is healthy with minimal amount of non-viable tissue remaining. We will continue with santyl, with expectation to change treatment plan next week. She is a  diabetic with last A1c 7.6 (01/2018) 02/07/18 on evaluation today patient actually appears to be showing signs of good improvement in regard to the right posterior lower extremity. With that being said the right second toe ulcer is actually plan for amputation at this point secondary to infection and osteomyelitis. This is actually scheduled for next Thursday. With that being said her wound seems to be healing quite nicely in regard to the posterior ulcer at this time she does have some Slough noted. That's in regard to lower extremity. 02/20/18 on evaluation today patient presents after having had invitation of her right second toe which was performed this past Thursday, when we could go by Dr. dew. Fortunately that seems to be doing fairly well although he's managing that we're not really do anything in that regard at this time. In regard to her posterior ulcer she actually appears to be doing very well. This infection is an excellent granulation bed there's really no slough noted on the surface of the wound at this point I do believe the Santyl has done its job currently. I think were ready to switch to something different. Electronic Signature(s) Signed: 02/20/2018 3:07:42 PM By: Worthy Keeler PA-C Entered By: Worthy Keeler on 02/20/2018 14:24:29 Nodine, Tannia P. ([id number]) -------------------------------------------------------------------------------- Physical Exam Details Patient Name: Karp, Bryant P. Date of Service: 02/20/2018 1:30 PM Medical Record Number: [id number] Patient Account Number: [id number] Date of Birth/Sex: [birth year] (84 y.o. F) Treating RN: Roger Shelter Primary Care Provider: Frazier Richards Other Clinician: Referring Provider: Frazier Richards Treating Provider/Extender: STONE III, Syrina Wake Weeks in Treatment: 3 Constitutional Well-nourished and well-hydrated in no acute distress. Respiratory normal breathing without difficulty. clear to auscultation  bilaterally. Psychiatric this patient is able to make decisions and demonstrates good insight into disease process. Alert and Oriented x 3. pleasant and cooperative. Notes Patient's wound bed at this point actually shows good granulation in regard to the right posterior lower  extremity. She's been tolerating the dressing changes without complication. There does not appear to be any evidence of infection. Electronic Signature(s) Signed: 02/20/2018 3:07:42 PM By: Worthy Keeler PA-C Entered By: Worthy Keeler on 02/20/2018 14:25:18 Flagg, Lajuanna P. ([id number]) -------------------------------------------------------------------------------- Physician Orders Details Patient Name: Gamboa, Tevis P. Date of Service: 02/20/2018 1:30 PM Medical Record Number: [id number] Patient Account Number: [id number] Date of Birth/Sex: [birth year] (84 y.o. F) Treating RN: Roger Shelter Primary Care Provider: Frazier Richards Other Clinician: Referring Provider: Frazier Richards Treating Provider/Extender: Melburn Hake, Jerimyah Vandunk Weeks in Treatment: 3 Verbal / Phone Orders: No Diagnosis Coding ICD-10 Coding Code Description E11.622 Type 2 diabetes mellitus with other skin ulcer I70.232 Atherosclerosis of native arteries of right leg with ulceration of calf L97.212 Non-pressure chronic ulcer of right calf with fat layer exposed Wound Cleansing Wound #2 Right,Posterior Lower Leg o Clean wound with Normal Saline. Anesthetic (add to Medication List) Wound #2 Right,Posterior Lower Leg o Topical Lidocaine 4% cream applied to wound bed prior to debridement (In Clinic Only). Primary Wound Dressing Wound #2 Right,Posterior Lower Leg o Silver Collagen Secondary Dressing Wound #2 Right,Posterior Lower Leg o Boardered Foam Dressing Dressing Change Frequency Wound #2 Right,Posterior Lower Leg o Change dressing every other day. Follow-up Appointments Wound #2 Right,Posterior Lower Leg o Return  Appointment in 1 week. Electronic Signature(s) Signed: 02/20/2018 3:07:42 PM By: Worthy Keeler PA-C Signed: 02/21/2018 4:22:48 PM By: Roger Shelter Entered By: Roger Shelter on 02/20/2018 14:04:38 Ullery, Thelia P. ([id number]) -------------------------------------------------------------------------------- Problem List Details Patient Name: Claycomb, Anjannette P. Date of Service: 02/20/2018 1:30 PM Medical Record Number: [id number] Patient Account Number: [id number] Date of Birth/Sex: 1918/10/09 (84 y.o. F) Treating RN: Roger Shelter Primary Care Provider: Frazier Richards Other Clinician: Referring Provider: Frazier Richards Treating Provider/Extender: Melburn Hake, Lollie Gunner Weeks in Treatment: 3 Active Problems ICD-10 Evaluated Encounter Code Description Active Date Today Diagnosis E11.622 Type 2 diabetes mellitus with other skin ulcer 01/30/2018 No Yes I70.232 Atherosclerosis of native arteries of right leg with ulceration of 01/30/2018 No Yes calf L97.212 Non-pressure chronic ulcer of right calf with fat layer exposed 01/30/2018 No Yes Inactive Problems Resolved Problems Electronic Signature(s) Signed: 02/20/2018 3:07:42 PM By: Worthy Keeler PA-C Entered By: Worthy Keeler on 02/20/2018 13:28:51 Wadleigh, Jendayi P. ([id number]) -------------------------------------------------------------------------------- Progress Note Details Patient Name: Dutton, Dinia P. Date of Service: 02/20/2018 1:30 PM Medical Record Number: [id number] Patient Account Number: [id number] Date of Birth/Sex: May 18, 1919 (84 y.o. F) Treating RN: Roger Shelter Primary Care Provider: Frazier Richards Other Clinician: Referring Provider: Frazier Richards Treating Provider/Extender: Melburn Hake, Kylina Vultaggio Weeks in Treatment: 3 Subjective Chief Complaint Information obtained from Patient Right posterior calf History of Present Illness (HPI) 01/30/18-She is seen in initial evaluation for a right plantar calf ulcer. She is  being treated for both the calf ulcer and a right second toe ulcer; the right second toe is planned for amputation on 9/19. The facility has been treating both ulcers with santyl. On 8/12 she underwent angiogram of the right lower extremity with angioplasty to the posterior tibial, right mid distal SFA, popliteal artery with stent placement to the right popliteal artery; post-procedure ABI-non-compressible bilaterally, RIGHT TBI 0.25, LEFT TBI 0.55. The right posterior calf ulcer is healthy with minimal amount of non-viable tissue remaining. We will continue with santyl, with expectation to change treatment plan next week. She is a diabetic with last A1c 7.6 (01/2018) 02/07/18 on evaluation today patient actually appears to be showing signs of good improvement in regard to the right posterior  lower extremity. With that being said the right second toe ulcer is actually plan for amputation at this point secondary to infection and osteomyelitis. This is actually scheduled for next Thursday. With that being said her wound seems to be healing quite nicely in regard to the posterior ulcer at this time she does have some Slough noted. That's in regard to lower extremity. 02/20/18 on evaluation today patient presents after having had invitation of her right second toe which was performed this past Thursday, when we could go by Dr. dew. Fortunately that seems to be doing fairly well although he's managing that we're not really do anything in that regard at this time. In regard to her posterior ulcer she actually appears to be doing very well. This infection is an excellent granulation bed there's really no slough noted on the surface of the wound at this point I do believe the Santyl has done its job currently. I think were ready to switch to something different. Patient History Information obtained from Patient. Social History Never smoker, Marital Status - Widowed, Alcohol Use - Never, Drug Use - No  History, Caffeine Use - Moderate. Medical And Surgical History Notes Ear/Nose/Mouth/Throat HOH Cardiovascular stent placed in right leg 3 weeks ago Review of Systems (ROS) Constitutional Symptoms (General Health) Denies complaints or symptoms of Fever, Chills. Respiratory The patient has no complaints or symptoms. Cardiovascular The patient has no complaints or symptoms. Psychiatric The patient has no complaints or symptoms. Delgrande, Ariely P. ([id number]) Objective Constitutional Well-nourished and well-hydrated in no acute distress. Vitals Time Taken: 1:34 PM, Temperature: 97.7 F, Pulse: 71 bpm, Respiratory Rate: 18 breaths/min, Blood Pressure: 136/56 mmHg. Respiratory normal breathing without difficulty. clear to auscultation bilaterally. Psychiatric this patient is able to make decisions and demonstrates good insight into disease process. Alert and Oriented x 3. pleasant and cooperative. General Notes: Patient's wound bed at this point actually shows good granulation in regard to the right posterior lower extremity. She's been tolerating the dressing changes without complication. There does not appear to be any evidence of infection. Integumentary (Hair, Skin) Wound #1 status is Amputation. Original cause of wound was Not Known. The wound is located on the Right Toe Second. The wound measures 0cm length x 0cm width x 0cm depth; 0cm^2 area and 0cm^3 volume. There is no tunneling or undermining noted. There is a small amount of serous drainage noted. The wound margin is flat and intact. There is no granulation within the wound bed. There is no necrotic tissue within the wound bed. The periwound skin appearance exhibited: Erythema. The periwound skin appearance did not exhibit: Callus, Crepitus, Excoriation, Induration, Rash, Scarring, Dry/Scaly, Maceration, Atrophie Blanche, Cyanosis, Ecchymosis, Hemosiderin Staining, Mottled, Pallor, Rubor. The surrounding wound skin color is  noted with erythema which is circumferential. Periwound temperature was noted as No Abnormality. The periwound has tenderness on palpation. Wound #2 status is Open. Original cause of wound was Not Known. The wound is located on the Right,Posterior Lower Leg. The wound measures 1.8cm length x 0.6cm width x 0.1cm depth; 0.848cm^2 area and 0.085cm^3 volume. There is Fat Layer (Subcutaneous Tissue) Exposed exposed. There is no tunneling or undermining noted. There is a small amount of sanguinous drainage noted. The wound margin is flat and intact. There is large (67-100%) red, hyper - granulation within the wound bed. There is no necrotic tissue within the wound bed. The periwound skin appearance did not exhibit: Callus, Crepitus, Excoriation, Induration, Rash, Scarring, Dry/Scaly, Maceration, Atrophie Blanche, Cyanosis, Ecchymosis, Hemosiderin Staining,  Mottled, Pallor, Rubor, Erythema. Periwound temperature was noted as No Abnormality. Assessment Active Problems ICD-10 Type 2 diabetes mellitus with other skin ulcer Atherosclerosis of native arteries of right leg with ulceration of calf Gundrum, Veva P. ([id number]) Non-pressure chronic ulcer of right calf with fat layer exposed Plan Wound Cleansing: Wound #2 Right,Posterior Lower Leg: Clean wound with Normal Saline. Anesthetic (add to Medication List): Wound #2 Right,Posterior Lower Leg: Topical Lidocaine 4% cream applied to wound bed prior to debridement (In Clinic Only). Primary Wound Dressing: Wound #2 Right,Posterior Lower Leg: Silver Collagen Secondary Dressing: Wound #2 Right,Posterior Lower Leg: Boardered Foam Dressing Dressing Change Frequency: Wound #2 Right,Posterior Lower Leg: Change dressing every other day. Follow-up Appointments: Wound #2 Right,Posterior Lower Leg: Return Appointment in 1 week. I'm going to suggest currently that we continue with the above wound care measures. Patient and her nephew are in agreement with  the plan. We're gonna subsequently see were things stand at follow-up in one weeks time. She's doing well that time that we will switch to a two week follow-up schedule. Please see above for specific wound care orders. We will see patient for re-evaluation in 1 week(s) here in the clinic. If anything worsens or changes patient will contact our office for additional recommendations. Electronic Signature(s) Signed: 02/20/2018 3:07:42 PM By: Worthy Keeler PA-C Entered By: Worthy Keeler on 02/20/2018 14:25:35 Melberg, Charis P. ([id number]) -------------------------------------------------------------------------------- ROS/PFSH Details Patient Name: Canizales, Melisssa P. Date of Service: 02/20/2018 1:30 PM Medical Record Number: [id number] Patient Account Number: [id number] Date of Birth/Sex: 04/14/1919 (84 y.o. F) Treating RN: Roger Shelter Primary Care Provider: Frazier Richards Other Clinician: Referring Provider: Frazier Richards Treating Provider/Extender: Melburn Hake, Natash Berman Weeks in Treatment: 3 Information Obtained From Patient Wound History Do you currently have one or more open woundso Yes How many open wounds do you currently haveo 2 Approximately how long have you had your woundso 3 months How have you been treating your wound(s) until nowo santyl Has your wound(s) ever healed and then re-openedo No Have you had any lab work done in the past montho No Have you tested positive for an antibiotic resistant organism (MRSA, VRE)o No Have you tested positive for osteomyelitis (bone infection)o No Have you had any tests for circulation on your legso Yes Where was the test doneo [facility] Constitutional Symptoms (General Health) Complaints and Symptoms: Negative for: Fever; Chills Eyes Medical History: Negative for: Cataracts; Glaucoma; Optic Neuritis Ear/Nose/Mouth/Throat Medical History: Negative for: Chronic sinus problems/congestion; Middle ear problems Past Medical History  Notes: HOH Hematologic/Lymphatic Medical History: Negative for: Anemia; Hemophilia; Human Immunodeficiency Virus; Lymphedema; Sickle Cell Disease Respiratory Complaints and Symptoms: No Complaints or Symptoms Medical History: Negative for: Aspiration; Asthma; Chronic Obstructive Pulmonary Disease (COPD); Pneumothorax; Sleep Apnea; Tuberculosis Cardiovascular Complaints and Symptoms: No Complaints or Symptoms Mabile, Wren P. ([id number]) Medical History: Positive for: Hypertension; Peripheral Arterial Disease Negative for: Angina; Arrhythmia; Congestive Heart Failure; Coronary Artery Disease; Deep Vein Thrombosis; Hypotension; Myocardial Infarction; Peripheral Venous Disease; Phlebitis; Vasculitis Past Medical History Notes: stent placed in right leg 3 weeks ago Gastrointestinal Medical History: Negative for: Cirrhosis ; Colitis; Crohnos; Hepatitis A; Hepatitis B; Hepatitis C Endocrine Medical History: Positive for: Type II Diabetes Negative for: Type I Diabetes Genitourinary Medical History: Positive for: End Stage Renal Disease - CKD stage 3 Immunological Medical History: Negative for: Lupus Erythematosus; Raynaudos; Scleroderma Integumentary (Skin) Medical History: Negative for: History of Burn; History of pressure wounds Musculoskeletal Medical History: Negative for: Gout; Rheumatoid Arthritis; Osteoarthritis; Osteomyelitis Neurologic Medical History:  Negative for: Dementia; Neuropathy; Quadriplegia; Paraplegia; Seizure Disorder Oncologic Medical History: Negative for: Received Chemotherapy; Received Radiation Psychiatric Complaints and Symptoms: No Complaints or Symptoms Medical History: Negative for: Anorexia/bulimia; Confinement Anxiety Immunizations Pneumococcal Vaccine: Rusnak, Tenya P. ([id number]) Received Pneumococcal Vaccination: Yes Implantable Devices Family and Social History Never smoker; Marital Status - Widowed; Alcohol Use: Never; Drug Use: No  History; Caffeine Use: Moderate; Financial Concerns: No; Food, Clothing or Shelter Needs: No; Support System Lacking: No; Transportation Concerns: No; Medical Power of Attorney: Yes - Angus Seller - nephew (Not Provided) Physician Affirmation I have reviewed and agree with the above information. Electronic Signature(s) Signed: 02/20/2018 3:07:42 PM By: Worthy Keeler PA-C Signed: 02/21/2018 4:22:48 PM By: Roger Shelter Entered By: Worthy Keeler on 02/20/2018 14:24:46 Helmuth, Kynzley P. ([id number]) -------------------------------------------------------------------------------- SuperBill Details Patient Name: Takaki, Letricia P. Date of Service: 02/20/2018 Medical Record Number: [id number] Patient Account Number: [id number] Date of Birth/Sex: July 28, 1918 (84 y.o. F) Treating RN: Roger Shelter Primary Care Provider: Frazier Richards Other Clinician: Referring Provider: Frazier Richards Treating Provider/Extender: Melburn Hake, Estrella Alcaraz Weeks in Treatment: 3 Diagnosis Coding ICD-10 Codes Code Description E11.622 Type 2 diabetes mellitus with other skin ulcer I70.232 Atherosclerosis of native arteries of right leg with ulceration of calf L97.212 Non-pressure chronic ulcer of right calf with fat layer exposed Facility Procedures CPT4 Code: [id number] Description: [phone] - WOUND CARE VISIT-LEV 2 EST PT Modifier: Quantity: 1 Physician Procedures CPT4 Code: 3267124 Description: 58099 - WC PHYS LEVEL 3 - EST PT ICD-10 Diagnosis Description E11.622 Type 2 diabetes mellitus with other skin ulcer I70.232 Atherosclerosis of native arteries of right leg with ulceratio L97.212 Non-pressure chronic ulcer of right calf with  fat layer expose Modifier: n of calf d Quantity: 1 Electronic Signature(s) Signed: 02/20/2018 3:07:42 PM By: Worthy Keeler PA-C Entered By: Worthy Keeler on 02/20/2018 14:25:46

## 2018-02-24 ENCOUNTER — Encounter (INDEPENDENT_AMBULATORY_CARE_PROVIDER_SITE_OTHER): Payer: Self-pay | Admitting: Nurse Practitioner

## 2018-02-24 NOTE — Progress Notes (Signed)
Subjective:    Patient ID: Ashley [name], female    DOB: 08-18-1918, 84 y.o.   MRN: [id number] Chief Complaint  Patient presents with  . Follow-up    HPI  Ashley [name] is a 84 y.o. female presents today following amputation of her third digit on her right lower extremity.  The patient does not have any complaints of pain today.  The patient's caregiver, her nephew, reports that her wound has been healing well per the wound clinic.  This wound as well as the wound on her lateral right ankle is healing well following angioplasty.  On last visit the right lower extremity was swollen and weeping, he is not today.  There is a small bit of blood on the bandage where her amputation was done.  Patient denies any rest pain or claudication.  The patient and family member denying any wound formation.  She denies any fever, chills, nausea, vomiting, diarrhea.  She denies any chest pain or shortness of breath.  She any amaurosis fugax or TIA-like symptoms.  Review of Systems   Review of Systems: Negative Unless Checked Constitutional: [] Weight loss  [] Fever  [] Chills Cardiac: [] Chest pain   ? Atrial Fibrillation  [] Palpitations   [] Shortness of breath when laying flat   [] Shortness of breath with exertion. Vascular:  [] Pain in legs with walking   [] Pain in legs with standing  [] History of DVT   [] Phlebitis   [] Swelling in legs   [] Varicose veins   [] Non-healing ulcers Pulmonary:   [] Uses home oxygen   [] Productive cough   [] Hemoptysis   [] Wheeze  [] COPD   [] Asthma Neurologic:  [] Dizziness   [] Seizures   [] History of stroke   [] History of TIA  [] Aphasia   [] Vissual changes   [] Weakness or numbness in arm   [] Weakness or numbness in leg Musculoskeletal:   [] Joint swelling   [] Joint pain   [] Low back pain  ? History of Knee Replacement Hematologic:  [] Easy bruising  [] Easy bleeding   [] Hypercoagulable state   [] Anemic Gastrointestinal:  [] Diarrhea   [] Vomiting  [] Gastroesophageal reflux/heartburn    [] Difficulty swallowing. Genitourinary:  [] Chronic kidney disease   [] Difficult urination  [] Anuric   [] Blood in urine Skin:  [] Rashes   [] Ulcers  Psychological:  [] History of anxiety   []  History of major depression  ? Memory Difficulties     Objective:   Physical Exam  BP (!) 171/76 (BP Location: Right Arm)   Pulse 78   Resp 16   Ht 5\' 2"  (1.575 m)   Wt 137 lb 11.2 oz (62.5 kg)   BMI 25.19 kg/m   Past Medical History:  Diagnosis Date  . Acute gouty arthritis 11/21/2016  . Aortic atherosclerosis ([place]) 09/04/2016  . Carotid artery disease ([place]) 11/21/2016  . Carotid stenosis 12/03/2015   Advanced heavily calcified atherosclerotic disease at both carotid bifurcations. Stenoses measured at 70% affecting the right proximal ICA and 80% affecting the left proximal ICA.  Marland Kitchen Chronic kidney disease   . Coronary atherosclerosis 09/04/2016  . Diverticulitis   . DM (diabetes mellitus), type 2 ([place]) 09/04/2016  . GERD (gastroesophageal reflux disease)   . Gout   . HTN, goal below 140/90 11/21/2016  . Hyperlipidemia 06/20/2016  . Hypothyroidism, unspecified 11/21/2016  . Primary osteoarthritis 11/21/2016   involving multiple joints  . Stroke [facility])      Gen: WD/WN, NAD Head: [place]/AT, No temporalis wasting, HOH Ear/Nose/Throat: Hearing grossly intact, nares w/o erythema or drainage Eyes: PER, EOMI, sclera  nonicteric.  Neck: Supple, no masses.  No JVD.  Pulmonary:  Good air movement, no use of accessory muscles.  Cardiac: RRR Vascular:  No edema present bilaterally.  Both lower extremities warm.  Amputation of right third digit.  Ulcer on the right lateral aspect of the ankle. Vessel Right Left  Dorsalis Pedis Palpable Palpable  Posterior Tibial Palpable Palpable   Gastrointestinal: soft, non-distended. No guarding/no peritoneal signs.  Musculoskeletal: Wheelchair bound. No deformity or atrophy.  Neurologic: Pain and light touch intact in extremities.  Symmetrical.  Speech is fluent.  Motor exam as listed above. Psychiatric: Judgment intact, Mood & affect appropriate for pt's clinical situation. Dermatologic: No Venous rashes. Ulcers Noted.  No changes consistent with cellulitis. Lymph : No Cervical lymphadenopathy, no lichenification or skin changes of chronic lymphedema.   Social History   Socioeconomic History  . Marital status: Widowed    Spouse name: Not on file  . Number of children: 0  . Years of education: 10  . Highest education level: 10th grade  Occupational History  . Not on file  Social Needs  . Financial resource strain: Not on file  . Food insecurity:    Worry: Not on file    Inability: Not on file  . Transportation needs:    Medical: Not on file    Non-medical: Not on file  Tobacco Use  . Smoking status: Never Smoker  . Smokeless tobacco: Never Used  Substance and Sexual Activity  . Alcohol use: No  . Drug use: No  . Sexual activity: Not Currently  Lifestyle  . Physical activity:    Days per week: Not on file    Minutes per session: Not on file  . Stress: Not on file  Relationships  . Social connections:    Talks on phone: Not on file    Gets together: Not on file    Attends religious service: Not on file    Active member of club or organization: Not on file    Attends meetings of clubs or organizations: Not on file    Relationship status: Not on file  . Intimate partner violence:    Fear of current or ex partner: Not on file    Emotionally abused: Not on file    Physically abused: Not on file    Forced sexual activity: Not on file  Other Topics Concern  . Not on file  Social History Narrative   Admitted to [facility] 11/16/2016   Widowed   No children   Does not drink alcohol   Never smoker   DNR    Past Surgical History:  Procedure Laterality Date  . AMPUTATION Right 02/13/2018   Procedure: AMPUTATION RAY ( 2ND TOE );  Surgeon: [name], MD;  Location: [facility] ORS;  Service: Vascular;  Laterality: Right;  .  CATARACT EXTRACTION Bilateral   . COLONOSCOPY    . Finger crystal removal     right 3rd  . LOWER EXTREMITY ANGIOGRAPHY Right 01/06/2018   Procedure: LOWER EXTREMITY ANGIOGRAPHY;  Surgeon: [name], MD;  Location: [facility];  Service: Cardiovascular;  Laterality: Right;  . toe crystal removal     right 1st toe    Family History  Problem Relation Age of Onset  . Hypertension Sister   . Arthritis Sister   . Heart disease Sister   . Alcohol abuse Brother   . Stroke Brother   . Hypertension Brother   . Alcohol abuse Brother   .  Stroke Brother   . Hypertension Brother   . Hypertension Brother   . Hypertension Sister     No Known Allergies     Assessment & Plan:   1. Peripheral vascular disease of lower extremity with ulceration (HCC) Recommend:  The patient is status post successful amputation. The patient's post surgical wounds and ulcerations are healing well.    No further invasive studies, angiography or surgery at this time The patient should continue walking and begin a more formal exercise program.  The patient should continue antiplatelet therapy and aggressive treatment of the lipid abnormalities   Patient should undergo noninvasive studies as ordered. The patient will follow up with me after the studies.    2. Gastroesophageal reflux disease without esophagitis Continue PPI as already ordered, this medication has been reviewed and there are no changes at this time.  Avoidence of caffeine and alcohol  Moderate elevation of the head of the bed   3. Type 2 diabetes mellitus with stage 3 chronic kidney disease, without long-term current use of insulin (HCC)  Continue hypoglycemic medications as already ordered, these medications have been reviewed and there are no changes at this time.  Hgb A1C to be monitored as already arranged by primary service   Current Outpatient Medications on File Prior to Visit  Medication Sig Dispense Refill  .  acetaminophen (MAPAP ARTHRITIS PAIN) 650 MG CR tablet Take 650 mg 2 (two) times daily by mouth.     Marland Kitchen acetaminophen (TYLENOL) 325 MG tablet Take 650 mg by mouth every 4 (four) hours as needed. for pain/ increased temp. May be administered orally, per G-tube if needed or rectally if unable to swallow (separate order). Maximum dose for 24 hours is 3,000 mg from all sources of Acetaminophen/ Tylenol    . Amino Acids-Protein Hydrolys (FEEDING SUPPLEMENT, PRO-STAT SUGAR FREE 64,) LIQD Take 30 mLs by mouth daily. To Promote Wound Healing. Wound on right lower leg    . aspirin EC 81 MG tablet Take 1 tablet (81 mg total) by mouth daily. 150 tablet 2  . atorvastatin (LIPITOR) 10 MG tablet Take 1 tablet (10 mg total) by mouth daily. 30 tablet 11  . camphor-menthol (SARNA) lotion Apply liberal amount to leg around the wound/dressing for itching BID and PRN. OK to use other places of itching. DO Not use on the wound. Okay to leave on bedside    . Cholecalciferol (VITAMIN D3) 2000 units capsule Take 2,000 Units by mouth daily.    . clopidogrel (PLAVIX) 75 MG tablet Take 1 tablet (75 mg total) by mouth daily. 30 tablet 11  . collagenase (SANTYL) ointment Apply topically daily. Cleanse right leg wound with ns, skin prep, around wound, apply santyl 1/4 in. thick to wound base, cover with ns moistened gauze, cover with allevyn dressing    . ENSURE (ENSURE) Take 237 mLs by mouth daily.    Marland Kitchen gabapentin (NEURONTIN) 100 MG capsule Take 100 mg by mouth 3 (three) times daily as needed.    Marland Kitchen levothyroxine (SYNTHROID, LEVOTHROID) 88 MCG tablet Take 88 mcg by mouth daily before breakfast.    . magnesium hydroxide (MILK OF MAGNESIA) 400 MG/5ML suspension Take 30 mLs by mouth every 4 (four) hours as needed for mild constipation. Constipation/ no BM for 2 days    . Magnesium Oxide 250 MG TABS Take 250 mg by mouth daily.    . pantoprazole (PROTONIX) 40 MG tablet Take 1 tablet (40 mg total) by mouth daily. Switch for any other  PPI  at similar dose and frequency 30 tablet 3  . Probiotic Product (RISA-BID PROBIOTIC) TABS Take 1 tablet by mouth daily.     . traMADol (ULTRAM) 50 MG tablet Take 1 tablet (50 mg total) by mouth every 6 (six) hours as needed. 20 tablet 0  . traZODone (DESYREL) 50 MG tablet Take 50 mg at bedtime by mouth.    Loura Pardon Salicylate (ASPERCREME) 10 % LOTN Apply thin film topically to areas of pain 3 times a day as needed    . UNABLE TO FIND Diet: Cardiac diet, No Concentrated Sweet    . vitamin B-12 (CYANOCOBALAMIN) 500 MCG tablet Take 500 mcg by mouth daily.     No current facility-administered medications on file prior to visit.     There are no Patient Instructions on file for this visit. No follow-ups on file.   Kris Hartmann, NP

## 2018-02-25 ENCOUNTER — Encounter
Admission: RE | Admit: 2018-02-25 | Discharge: 2018-02-25 | Disposition: A | Discharge status: Home / Self Care | Payer: Medicare Other | Source: Ambulatory Visit | Attending: Internal Medicine | Admitting: Internal Medicine

## 2018-02-27 ENCOUNTER — Encounter: Payer: Medicare Other | Attending: Physician Assistant | Admitting: Physician Assistant

## 2018-02-27 DIAGNOSIS — N186 End stage renal disease: Secondary | ICD-10-CM | POA: Diagnosis not present

## 2018-02-27 DIAGNOSIS — E11622 Type 2 diabetes mellitus with other skin ulcer: Secondary | ICD-10-CM | POA: Diagnosis not present

## 2018-02-27 DIAGNOSIS — I120 Hypertensive chronic kidney disease with stage 5 chronic kidney disease or end stage renal disease: Secondary | ICD-10-CM | POA: Insufficient documentation

## 2018-02-27 DIAGNOSIS — L97212 Non-pressure chronic ulcer of right calf with fat layer exposed: Secondary | ICD-10-CM | POA: Diagnosis not present

## 2018-02-27 DIAGNOSIS — E1122 Type 2 diabetes mellitus with diabetic chronic kidney disease: Secondary | ICD-10-CM | POA: Insufficient documentation

## 2018-02-27 DIAGNOSIS — I70232 Atherosclerosis of native arteries of right leg with ulceration of calf: Secondary | ICD-10-CM | POA: Insufficient documentation

## 2018-02-27 DIAGNOSIS — E11621 Type 2 diabetes mellitus with foot ulcer: Secondary | ICD-10-CM | POA: Diagnosis present

## 2018-03-01 NOTE — Progress Notes (Signed)
JASMON, MATTICE ([id number]) Visit Report for 02/27/2018 Chief Complaint Document Details Patient Name: Ashley [name], [name] Date of Service: 02/27/2018 12:45 PM Medical Record Number: [id number] Patient Account Number: [id number] Date of Birth/Sex: 08-11-1918 (84 y.o. F) Treating RN: Roger Shelter Primary Care Provider: Frazier Richards Other Clinician: Referring Provider: Frazier Richards Treating Provider/Extender: Melburn Hake, Lanaiya Lantry Weeks in Treatment: 4 Information Obtained from: Patient Chief Complaint Right posterior calf Electronic Signature(s) Signed: 02/27/2018 4:36:35 PM By: Worthy Keeler PA-C Entered By: Worthy Keeler on 02/27/2018 12:46:04 Ashley [name], [name] ([id number]) -------------------------------------------------------------------------------- HPI Details Patient Name: [name], [name] Date of Service: 02/27/2018 12:45 PM Medical Record Number: [id number] Patient Account Number: [id number] Date of Birth/Sex: [birth year] (84 y.o. F) Treating RN: Roger Shelter Primary Care Provider: Frazier Richards Other Clinician: Referring Provider: Frazier Richards Treating Provider/Extender: Melburn Hake, Jerret Mcbane Weeks in Treatment: 4 History of Present Illness HPI Description: 01/30/18-She is seen in initial evaluation for a right plantar calf ulcer. She is being treated for both the calf ulcer and a right second toe ulcer; the right second toe is planned for amputation on 9/19. The facility has been treating both ulcers with santyl. On 8/12 she underwent angiogram of the right lower extremity with angioplasty to the posterior tibial, right mid distal SFA, popliteal artery with stent placement to the right popliteal artery; post-procedure ABI-non-compressible bilaterally, RIGHT TBI 0.25, LEFT TBI 0.55. The right posterior calf ulcer is healthy with minimal amount of non-viable tissue remaining. We will continue with santyl, with expectation to change treatment plan next week. She is a  diabetic with last A1c 7.6 (01/2018) 02/07/18 on evaluation today patient actually appears to be showing signs of good improvement in regard to the right posterior lower extremity. With that being said the right second toe ulcer is actually plan for amputation at this point secondary to infection and osteomyelitis. This is actually scheduled for next Thursday. With that being said her wound seems to be healing quite nicely in regard to the posterior ulcer at this time she does have some Slough noted. That's in regard to lower extremity. 02/20/18 on evaluation today patient presents after having had invitation of her right second toe which was performed this past Thursday, when we could go by Dr. dew. Fortunately that seems to be doing fairly well although he's managing that we're not really do anything in that regard at this time. In regard to her posterior ulcer she actually appears to be doing very well. This infection is an excellent granulation bed there's really no slough noted on the surface of the wound at this point I do believe the Santyl has done its job currently. I think were ready to switch to something different. 02/27/18 on evaluation today patient actually appears to be showing signs of great improvement in regard to her lower extremity ulcer. In fact this is almost completely healed. I'm very pleased with how things seem to be progressing. Electronic Signature(s) Signed: 02/27/2018 4:36:35 PM By: Worthy Keeler PA-C Entered By: Worthy Keeler on 02/27/2018 15:48:30 Ashley [name], [name] ([id number]) -------------------------------------------------------------------------------- Physical Exam Details Patient Name: [name], [name] Date of Service: 02/27/2018 12:45 PM Medical Record Number: [id number] Patient Account Number: [id number] Date of Birth/Sex: 04/06/1919 (84 y.o. F) Treating RN: Roger Shelter Primary Care Provider: Frazier Richards Other Clinician: Referring Provider:  Frazier Richards Treating Provider/Extender: STONE III, Renessa Wellnitz Weeks in Treatment: 4 Constitutional Well-nourished and well-hydrated in no acute distress. Respiratory normal breathing without difficulty. clear to auscultation bilaterally. Cardiovascular regular rate  and rhythm with normal S1, S2. Psychiatric this patient is able to make decisions and demonstrates good insight into disease process. Alert and Oriented x 3. pleasant and cooperative. Notes Patient's wound shows great epithelialization at this point. There does not appear to be any evidence of infection which is great news. Overall I'm extremely happy with the progress of to this point. I believe she will likely be healed in the next 1-2 weeks Electronic Signature(s) Signed: 02/27/2018 4:36:35 PM By: Worthy Keeler PA-C Entered By: Worthy Keeler on 02/27/2018 15:49:02 [name], [name] ([id number]) -------------------------------------------------------------------------------- Physician Orders Details Patient Name: [name], [name] Date of Service: 02/27/2018 12:45 PM Medical Record Number: [id number] Patient Account Number: [id number] Date of Birth/Sex: 1919/04/19 (84 y.o. F) Treating RN: Roger Shelter Primary Care Provider: Frazier Richards Other Clinician: Referring Provider: Frazier Richards Treating Provider/Extender: Melburn Hake, Arieonna Medine Weeks in Treatment: 4 Verbal / Phone Orders: No Diagnosis Coding ICD-10 Coding Code Description E11.622 Type 2 diabetes mellitus with other skin ulcer I70.232 Atherosclerosis of native arteries of right leg with ulceration of calf L97.212 Non-pressure chronic ulcer of right calf with fat layer exposed Wound Cleansing Wound #2 Right,Posterior Lower Leg o Clean wound with Normal Saline. Anesthetic (add to Medication List) Wound #2 Right,Posterior Lower Leg o Topical Lidocaine 4% cream applied to wound bed prior to debridement (In Clinic Only). Primary Wound Dressing Wound  #2 Right,Posterior Lower Leg o Silver Collagen Secondary Dressing Wound #2 Right,Posterior Lower Leg o Boardered Foam Dressing Dressing Change Frequency Wound #2 Right,Posterior Lower Leg o Change dressing every other day. Follow-up Appointments Wound #2 Right,Posterior Lower Leg o Return Appointment in 2 weeks. Notes IF wound is healed before next clinic visit patient may cancel appointment. Electronic Signature(s) Signed: 02/27/2018 4:36:35 PM By: Worthy Keeler PA-C Signed: 02/27/2018 4:46:50 PM By: Roger Shelter Entered By: Roger Shelter on 02/27/2018 13:28:10 Ashley [name], [name] ([id number]) -------------------------------------------------------------------------------- Problem List Details Patient Name: Delamater, Bryley P. Date of Service: 02/27/2018 12:45 PM Medical Record Number: [id number] Patient Account Number: [id number] Date of Birth/Sex: 09-21-1918 (84 y.o. F) Treating RN: Roger Shelter Primary Care Provider: Frazier Richards Other Clinician: Referring Provider: Frazier Richards Treating Provider/Extender: Melburn Hake, Mohanad Carsten Weeks in Treatment: 4 Active Problems ICD-10 Evaluated Encounter Code Description Active Date Today Diagnosis E11.622 Type 2 diabetes mellitus with other skin ulcer 01/30/2018 No Yes I70.232 Atherosclerosis of native arteries of right leg with ulceration of 01/30/2018 No Yes calf L97.212 Non-pressure chronic ulcer of right calf with fat layer exposed 01/30/2018 No Yes Inactive Problems Resolved Problems Electronic Signature(s) Signed: 02/27/2018 4:36:35 PM By: Worthy Keeler PA-C Entered By: Worthy Keeler on 02/27/2018 12:45:58 Ashley [name], [name] ([id number]) -------------------------------------------------------------------------------- Progress Note Details Patient Name: Vento, Julita P. Date of Service: 02/27/2018 12:45 PM Medical Record Number: [id number] Patient Account Number: [id number] Date of Birth/Sex: 04/13/1919 (84 y.o.  F) Treating RN: Roger Shelter Primary Care Provider: Frazier Richards Other Clinician: Referring Provider: Frazier Richards Treating Provider/Extender: Melburn Hake, Eward Rutigliano Weeks in Treatment: 4 Subjective Chief Complaint Information obtained from Patient Right posterior calf History of Present Illness (HPI) 01/30/18-She is seen in initial evaluation for a right plantar calf ulcer. She is being treated for both the calf ulcer and a right second toe ulcer; the right second toe is planned for amputation on 9/19. The facility has been treating both ulcers with santyl. On 8/12 she underwent angiogram of the right lower extremity with angioplasty to the posterior tibial, right mid distal SFA, popliteal artery with stent placement to  the right popliteal artery; post-procedure ABI-non-compressible bilaterally, RIGHT TBI 0.25, LEFT TBI 0.55. The right posterior calf ulcer is healthy with minimal amount of non-viable tissue remaining. We will continue with santyl, with expectation to change treatment plan next week. She is a diabetic with last A1c 7.6 (01/2018) 02/07/18 on evaluation today patient actually appears to be showing signs of good improvement in regard to the right posterior lower extremity. With that being said the right second toe ulcer is actually plan for amputation at this point secondary to infection and osteomyelitis. This is actually scheduled for next Thursday. With that being said her wound seems to be healing quite nicely in regard to the posterior ulcer at this time she does have some Slough noted. That's in regard to lower extremity. 02/20/18 on evaluation today patient presents after having had invitation of her right second toe which was performed this past Thursday, when we could go by Dr. dew. Fortunately that seems to be doing fairly well although he's managing that we're not really do anything in that regard at this time. In regard to her posterior ulcer she actually appears  to be doing very well. This infection is an excellent granulation bed there's really no slough noted on the surface of the wound at this point I do believe the Santyl has done its job currently. I think were ready to switch to something different. 02/27/18 on evaluation today patient actually appears to be showing signs of great improvement in regard to her lower extremity ulcer. In fact this is almost completely healed. I'm very pleased with how things seem to be progressing. Patient History Information obtained from Patient. Social History Never smoker, Marital Status - Widowed, Alcohol Use - Never, Drug Use - No History, Caffeine Use - Moderate. Medical And Surgical History Notes Ear/Nose/Mouth/Throat HOH Cardiovascular stent placed in right leg 3 weeks ago Review of Systems (ROS) Constitutional Symptoms (General Health) Denies complaints or symptoms of Fever, Chills. Respiratory The patient has no complaints or symptoms. Cardiovascular Aull, Daziyah P. ([id number]) The patient has no complaints or symptoms. Psychiatric The patient has no complaints or symptoms. Objective Constitutional Well-nourished and well-hydrated in no acute distress. Vitals Time Taken: 12:45 PM, Temperature: 97.7 F, Pulse: 75 bpm, Respiratory Rate: 18 breaths/min, Blood Pressure: 163/74 mmHg. Respiratory normal breathing without difficulty. clear to auscultation bilaterally. Cardiovascular regular rate and rhythm with normal S1, S2. Psychiatric this patient is able to make decisions and demonstrates good insight into disease process. Alert and Oriented x 3. pleasant and cooperative. General Notes: Patient's wound shows great epithelialization at this point. There does not appear to be any evidence of infection which is great news. Overall I'm extremely happy with the progress of to this point. I believe she will likely be healed in the next 1-2 weeks Integumentary (Hair, Skin) Wound #2 status is Open.  Original cause of wound was Not Known. The wound is located on the Right,Posterior Lower Leg. The wound measures 1.4cm length x 0.2cm width x 0.1cm depth; 0.22cm^2 area and 0.022cm^3 volume. There is Fat Layer (Subcutaneous Tissue) Exposed exposed. There is no tunneling or undermining noted. There is a small amount of sanguinous drainage noted. The wound margin is flat and intact. There is large (67-100%) red, hyper - granulation within the wound bed. There is no necrotic tissue within the wound bed. The periwound skin appearance did not exhibit: Callus, Crepitus, Excoriation, Induration, Rash, Scarring, Dry/Scaly, Maceration, Atrophie Blanche, Cyanosis, Ecchymosis, Hemosiderin Staining, Mottled, Pallor, Rubor, Erythema. Periwound temperature was  noted as No Abnormality. Assessment Active Problems ICD-10 Type 2 diabetes mellitus with other skin ulcer Atherosclerosis of native arteries of right leg with ulceration of calf Non-pressure chronic ulcer of right calf with fat layer exposed Ashley [name], [name] ([id number]) Plan Wound Cleansing: Wound #2 Right,Posterior Lower Leg: Clean wound with Normal Saline. Anesthetic (add to Medication List): Wound #2 Right,Posterior Lower Leg: Topical Lidocaine 4% cream applied to wound bed prior to debridement (In Clinic Only). Primary Wound Dressing: Wound #2 Right,Posterior Lower Leg: Silver Collagen Secondary Dressing: Wound #2 Right,Posterior Lower Leg: Boardered Foam Dressing Dressing Change Frequency: Wound #2 Right,Posterior Lower Leg: Change dressing every other day. Follow-up Appointments: Wound #2 Right,Posterior Lower Leg: Return Appointment in 2 weeks. General Notes: IF wound is healed before next clinic visit patient may cancel appointment. At this time my suggestion is going to be that we continue with the Current wound care measures. I will put in for a two week follow-up visit. However if she's doing better prior to that point we can  always cancel that appointment and we will see her back just as needed following. The patient and her nephew are in agreement with plan. Will subsequently see were things stand. Electronic Signature(s) Signed: 02/27/2018 4:36:35 PM By: Worthy Keeler PA-C Entered By: Worthy Keeler on 02/27/2018 15:50:05 Ashley [name], [name] ([id number]) -------------------------------------------------------------------------------- ROS/PFSH Details Patient Name: Ashley [name], [name] Date of Service: 02/27/2018 12:45 PM Medical Record Number: [id number] Patient Account Number: [id number] Date of Birth/Sex: 03-Jan-1919 (84 y.o. F) Treating RN: Roger Shelter Primary Care Provider: Frazier Richards Other Clinician: Referring Provider: Frazier Richards Treating Provider/Extender: Melburn Hake, Frank Pilger Weeks in Treatment: 4 Information Obtained From Patient Wound History Do you currently have one or more open woundso Yes How many open wounds do you currently haveo 2 Approximately how long have you had your woundso 3 months How have you been treating your wound(s) until nowo santyl Has your wound(s) ever healed and then re-openedo No Have you had any lab work done in the past montho No Have you tested positive for an antibiotic resistant organism (MRSA, VRE)o No Have you tested positive for osteomyelitis (bone infection)o No Have you had any tests for circulation on your legso Yes Where was the test doneo [facility] Constitutional Symptoms (General Health) Complaints and Symptoms: Negative for: Fever; Chills Eyes Medical History: Negative for: Cataracts; Glaucoma; Optic Neuritis Ear/Nose/Mouth/Throat Medical History: Negative for: Chronic sinus problems/congestion; Middle ear problems Past Medical History Notes: HOH Hematologic/Lymphatic Medical History: Negative for: Anemia; Hemophilia; Human Immunodeficiency Virus; Lymphedema; Sickle Cell Disease Respiratory Complaints and Symptoms: No Complaints or  Symptoms Medical History: Negative for: Aspiration; Asthma; Chronic Obstructive Pulmonary Disease (COPD); Pneumothorax; Sleep Apnea; Tuberculosis Cardiovascular Complaints and Symptoms: No Complaints or Symptoms Ashley [name], [name] ([id number]) Medical History: Positive for: Hypertension; Peripheral Arterial Disease Negative for: Angina; Arrhythmia; Congestive Heart Failure; Coronary Artery Disease; Deep Vein Thrombosis; Hypotension; Myocardial Infarction; Peripheral Venous Disease; Phlebitis; Vasculitis Past Medical History Notes: stent placed in right leg 3 weeks ago Gastrointestinal Medical History: Negative for: Cirrhosis ; Colitis; Crohnos; Hepatitis A; Hepatitis B; Hepatitis C Endocrine Medical History: Positive for: Type II Diabetes Negative for: Type I Diabetes Genitourinary Medical History: Positive for: End Stage Renal Disease - CKD stage 3 Immunological Medical History: Negative for: Lupus Erythematosus; Raynaudos; Scleroderma Integumentary (Skin) Medical History: Negative for: History of Burn; History of pressure wounds Musculoskeletal Medical History: Negative for: Gout; Rheumatoid Arthritis; Osteoarthritis; Osteomyelitis Neurologic Medical History: Negative for: Dementia; Neuropathy; Quadriplegia; Paraplegia; Seizure Disorder Oncologic Medical  History: Negative for: Received Chemotherapy; Received Radiation Psychiatric Complaints and Symptoms: No Complaints or Symptoms Medical History: Negative for: Anorexia/bulimia; Confinement Anxiety Immunizations Pneumococcal Vaccine: Ashley [name], [name] ([id number]) Received Pneumococcal Vaccination: Yes Implantable Devices Family and Social History Never smoker; Marital Status - Widowed; Alcohol Use: Never; Drug Use: No History; Caffeine Use: Moderate; Financial Concerns: No; Food, Clothing or Shelter Needs: No; Support System Lacking: No; Transportation Concerns: No; Medical Power of Attorney: Yes - Angus Seller - nephew (Not  Provided) Physician Affirmation I have reviewed and agree with the above information. Electronic Signature(s) Signed: 02/27/2018 4:36:35 PM By: Worthy Keeler PA-C Signed: 02/27/2018 4:46:50 PM By: Roger Shelter Entered By: Worthy Keeler on 02/27/2018 15:48:47 Ashley [name], [name] ([id number]) -------------------------------------------------------------------------------- SuperBill Details Patient Name: Ashley [name], [name] Date of Service: 02/27/2018 Medical Record Number: [id number] Patient Account Number: [id number] Date of Birth/Sex: 10-Aug-1918 (84 y.o. F) Treating RN: Roger Shelter Primary Care Provider: Frazier Richards Other Clinician: Referring Provider: Frazier Richards Treating Provider/Extender: Melburn Hake, Sharanda Shinault Weeks in Treatment: 4 Diagnosis Coding ICD-10 Codes Code Description E11.622 Type 2 diabetes mellitus with other skin ulcer I70.232 Atherosclerosis of native arteries of right leg with ulceration of calf L97.212 Non-pressure chronic ulcer of right calf with fat layer exposed Facility Procedures CPT4 Code: [id number] Description: [phone] - WOUND CARE VISIT-LEV 2 EST PT Modifier: Quantity: 1 Physician Procedures CPT4 Code: [id number] Description: [id number] - WC PHYS LEVEL 3 - EST PT ICD-10 Diagnosis Description E11.622 Type 2 diabetes mellitus with other skin ulcer I70.232 Atherosclerosis of native arteries of right leg with ulceratio L97.212 Non-pressure chronic ulcer of right calf with  fat layer expose Modifier: n of calf d Quantity: 1 Electronic Signature(s) Signed: 02/27/2018 4:36:35 PM By: Worthy Keeler PA-C Entered By: Worthy Keeler on 02/27/2018 15:50:19

## 2018-03-01 NOTE — Progress Notes (Signed)
VERINA, GALENO ([id number]) Visit Report for 02/27/2018 Arrival Information Details Patient Name: Ashley [name], [name] Date of Service: 02/27/2018 12:45 PM Medical Record Number: [id number] Patient Account Number: [id number] Date of Birth/Sex: Mar 02, 1919 (84 y.o. F) Treating RN: Roger Shelter Primary Care Jaken Fregia: Frazier Richards Other Clinician: Referring Kayonna Lawniczak: Frazier Richards Treating Nikos Anglemyer/Extender: Melburn Hake, HOYT Weeks in Treatment: 4 Visit Information History Since Last Visit Added or deleted any medications: No Patient Arrived: Ambulatory Any new allergies or adverse reactions: No Arrival Time: 12:40 Had a fall or experienced change in No Accompanied By: [name], nephew activities of daily living that may affect Transfer Assistance: Manual risk of falls: Patient Identification Verified: Yes Signs or symptoms of abuse/neglect since last visito No Secondary Verification Process Yes Hospitalized since last visit: No Completed: Implantable device outside of the clinic excluding No Patient Has Alerts: Yes cellular tissue based products placed in the center Patient Alerts: ABI [place] since last visit: BILATERAL Has Dressing in Place as Prescribed: Yes AVVS 01/17/18 Pain Present Now: No TBI L .55 R .25 Electronic Signature(s) Signed: 02/27/2018 4:01:29 PM By: Lorine Bears RCP, RRT, CHT Entered By: Ashley Sax, Amado Nash on 02/27/2018 12:44:24 Grove, Caedence P. ([id number]) -------------------------------------------------------------------------------- Clinic Level of Care Assessment Details Patient Name: Ashley [name], [name] Date of Service: 02/27/2018 12:45 PM Medical Record Number: [id number] Patient Account Number: [id number] Date of Birth/Sex: [birth year] (84 y.o. F) Treating RN: Roger Shelter Primary Care Destynee Stringfellow: Frazier Richards Other Clinician: Referring Miette Molenda: Frazier Richards Treating Rafferty Postlewait/Extender: Melburn Hake, HOYT Weeks in  Treatment: 4 Clinic Level of Care Assessment Items TOOL 4 Quantity Score []  - Use when only an EandM is performed on FOLLOW-UP visit 0 ASSESSMENTS - Nursing Assessment / Reassessment X - Reassessment of Co-morbidities (includes updates in patient status) 1 10 X- 1 5 Reassessment of Adherence to Treatment Plan ASSESSMENTS - Wound and Skin Assessment / Reassessment X - Simple Wound Assessment / Reassessment - one wound 1 5 []  - 0 Complex Wound Assessment / Reassessment - multiple wounds []  - 0 Dermatologic / Skin Assessment (not related to wound area) ASSESSMENTS - Focused Assessment []  - Circumferential Edema Measurements - multi extremities 0 []  - 0 Nutritional Assessment / Counseling / Intervention []  - 0 Lower Extremity Assessment (monofilament, tuning fork, pulses) []  - 0 Peripheral Arterial Disease Assessment (using hand held doppler) ASSESSMENTS - Ostomy and/or Continence Assessment and Care []  - Incontinence Assessment and Management 0 []  - 0 Ostomy Care Assessment and Management (repouching, etc.) PROCESS - Coordination of Care X - Simple Patient / Family Education for ongoing care 1 15 []  - 0 Complex (extensive) Patient / Family Education for ongoing care []  - 0 Staff obtains Programmer, systems, Records, Test Results / Process Orders []  - 0 Staff telephones HHA, Nursing Homes / Clarify orders / etc []  - 0 Routine Transfer to another Facility (non-emergent condition) []  - 0 Routine Hospital Admission (non-emergent condition) []  - 0 New Admissions / Biomedical engineer / Ordering NPWT, Apligraf, etc. []  - 0 Emergency Hospital Admission (emergent condition) X- 1 10 Simple Discharge Coordination Burkemper, Aerica P. ([id number]) []  - 0 Complex (extensive) Discharge Coordination PROCESS - Special Needs []  - Pediatric / Minor Patient Management 0 []  - 0 Isolation Patient Management []  - 0 Hearing / Language / Visual special needs []  - 0 Assessment of Community assistance  (transportation, D/C planning, etc.) []  - 0 Additional assistance / Altered mentation []  - 0 Support Surface(s) Assessment (bed, cushion, seat, etc.) INTERVENTIONS - Wound Cleansing / Measurement X -  Simple Wound Cleansing - one wound 1 5 []  - 0 Complex Wound Cleansing - multiple wounds X- 1 5 Wound Imaging (photographs - any number of wounds) []  - 0 Wound Tracing (instead of photographs) X- 1 5 Simple Wound Measurement - one wound []  - 0 Complex Wound Measurement - multiple wounds INTERVENTIONS - Wound Dressings X - Small Wound Dressing one or multiple wounds 1 10 []  - 0 Medium Wound Dressing one or multiple wounds []  - 0 Large Wound Dressing one or multiple wounds []  - 0 Application of Medications - topical []  - 0 Application of Medications - injection INTERVENTIONS - Miscellaneous []  - External ear exam 0 []  - 0 Specimen Collection (cultures, biopsies, blood, body fluids, etc.) []  - 0 Specimen(s) / Culture(s) sent or taken to Lab for analysis []  - 0 Patient Transfer (multiple staff / Civil Service fast streamer / Similar devices) []  - 0 Simple Staple / Suture removal (25 or less) []  - 0 Complex Staple / Suture removal (26 or more) []  - 0 Hypo / Hyperglycemic Management (close monitor of Blood Glucose) []  - 0 Ankle / Brachial Index (ABI) - do not check if billed separately X- 1 5 Vital Signs Galen, Rashika P. ([id number]) Has the patient been seen at the hospital within the last three years: Yes Total Score: 75 Level Of Care: New/Established - Level 2 Electronic Signature(s) Signed: 02/27/2018 4:46:50 PM By: Roger Shelter Entered By: Roger Shelter on 02/27/2018 13:28:51 Holtrop, Aisha P. ([id number]) -------------------------------------------------------------------------------- Lower Extremity Assessment Details Patient Name: Ashley [name], [name] Date of Service: 02/27/2018 12:45 PM Medical Record Number: [id number] Patient Account Number: [id number] Date of Birth/Sex: 05-13-1919  (84 y.o. F) Treating RN: Montey Hora Primary Care Patrina Andreas: Frazier Richards Other Clinician: Referring Ashwika Freels: Frazier Richards Treating Zaion Hreha/Extender: Melburn Hake, HOYT Weeks in Treatment: 4 Vascular Assessment Pulses: Dorsalis Pedis Palpable: [Right:Yes] Posterior Tibial Extremity colors, hair growth, and conditions: Extremity Color: [Right:Hyperpigmented] Hair Growth on Extremity: [Right:Yes] Temperature of Extremity: [Right:Warm] Capillary Refill: [Right:< 3 seconds] Toe Nail Assessment Left: Right: Thick: Yes Discolored: Yes Deformed: No Improper Length and Hygiene: No Electronic Signature(s) Signed: 02/27/2018 5:10:46 PM By: Montey Hora Entered By: Montey Hora on 02/27/2018 12:57:03 Double, Matsuko P. ([id number]) -------------------------------------------------------------------------------- Multi Wound Chart Details Patient Name: Ashley [name], [name] Date of Service: 02/27/2018 12:45 PM Medical Record Number: [id number] Patient Account Number: [id number] Date of Birth/Sex: 10/29/1918 (84 y.o. 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] [N/A:N/A] Comorbid History: [2:Hypertension, Peripheral Arterial Disease, Type II Diabetes, End Stage Renal Disease] [N/A:N/A] Date Acquired: [2:10/28/2017] [N/A:N/A] Weeks of Treatment: [2:4] [N/A:N/A] Wound Status: [2:Open] [N/A:N/A] Pending Amputation on  [2:Yes] [N/A:N/A] Presentation: Measurements L x W x D [2:1.4x0.2x0.1] [N/A:N/A] (cm) Area (cm) : [2:0.22] [N/A:N/A] Volume (cm) : [2:0.022] [N/A:N/A] % Reduction in Area: [2:96.70%] [N/A:N/A] % Reduction in Volume: [2:98.30%] [N/A:N/A] Classification: [2:Grade 1] [N/A:N/A] Exudate Amount: [2:Small] [N/A:N/A] Exudate Type: [2:Sanguinous] [N/A:N/A] Exudate Color: [2:red] [N/A:N/A] Wound Margin: [2:Flat and Intact] [N/A:N/A] Granulation Amount: [2:Large (67-100%)] [N/A:N/A] Granulation Quality: [2:Red, Hyper-granulation] [N/A:N/A] Necrotic Amount: [2:None Present (0%)] [N/A:N/A] Exposed Structures: [2:Fat Layer (Subcutaneous Tissue) Exposed: Yes Fascia: No Tendon: No Muscle: No Joint:  No Bone: No] [N/A:N/A] Epithelialization: [2:None] [N/A:N/A] Periwound Skin Texture: Excoriation: No N/A N/A Induration: No Callus: No Crepitus: No Rash: No Scarring: No Periwound Skin Moisture: Maceration: No N/A N/A Dry/Scaly: No Periwound Skin Color: Atrophie Blanche: No N/A N/A Cyanosis: No Ecchymosis: No Erythema: No Hemosiderin Staining: No Mottled: No Pallor: No Rubor: No Temperature: No Abnormality N/A N/A Tenderness on Palpation: No N/A N/A Wound Preparation: Ulcer Cleansing: N/A N/A Rinsed/Irrigated with Saline Topical Anesthetic Applied: Other: lidocaine 4% Treatment Notes Electronic Signature(s) Signed: 02/27/2018 4:46:50 PM By: Roger Shelter Entered By: Roger Shelter on 02/27/2018 13:24:42 Ashley [name], [name] ([id number]) -------------------------------------------------------------------------------- [place] Details Patient Name: Mertens, Chizuko P. Date of Service: 02/27/2018 12:45 PM Medical Record Number: [id number] Patient Account Number: [id number] Date of Birth/Sex: 12-01-1918 (84 y.o. F) Treating RN: Roger Shelter Primary Care Abdalla Naramore: Frazier Richards Other Clinician: Referring Jobeth Pangilinan: Frazier Richards Treating Josanne Boerema/Extender:  Melburn Hake, HOYT Weeks in Treatment: 4 Active Inactive ` Orientation to the Wound Care Program Nursing Diagnoses: Knowledge deficit related to the wound healing center program Goals: Patient/caregiver will verbalize understanding of the [place] Program Date Initiated: 01/30/2018 Target Resolution Date: 02/20/2018 Goal Status: Active Interventions: Provide education on orientation to the wound center Notes: ` Wound/Skin Impairment Nursing Diagnoses: Impaired tissue integrity Goals: Patient/caregiver will verbalize understanding of skin care regimen Date Initiated: 01/30/2018 Target Resolution Date: 02/19/2018 Goal Status: Active Ulcer/skin breakdown will have a volume reduction of 30% by week 4 Date Initiated: 01/30/2018 Target Resolution Date: 02/19/2018 Goal Status: Active Interventions: Assess patient/caregiver ability to obtain necessary supplies Assess patient/caregiver ability to perform ulcer/skin care regimen upon admission and as needed Assess ulceration(s) every visit Treatment Activities: Skin care regimen initiated : 01/30/2018 Notes: Electronic Signature(s) Signed: 02/27/2018 4:46:50 PM By: Chauncey Reading, Deserae P. ([id number]) Entered By: Roger Shelter on 02/27/2018 13:24:31 Ashley [name], [name] ([id number]) -------------------------------------------------------------------------------- Pain Assessment Details Patient Name: Ashley [name], [name] Date of Service: 02/27/2018 12:45 PM Medical Record Number: [id number] Patient Account Number: [id number] Date of Birth/Sex: [birth year] (84 y.o. F) Treating RN: Roger Shelter Primary Care Olayinka Gathers: Frazier Richards Other Clinician: Referring Layden Caterino: Frazier Richards Treating Jejuan Scala/Extender: Melburn Hake, HOYT Weeks in Treatment: 4 Active Problems Location of Pain Severity and Description of Pain Patient Has Paino No Site Locations Pain Management and Medication Current Pain Management: Electronic  Signature(s) Signed: 02/27/2018 4:01:29 PM By: Lorine Bears RCP, RRT, CHT Signed: 02/27/2018 4:46:50 PM By: Roger Shelter Entered By: Lorine Bears on 02/27/2018 12:44:45 Ashley [name], [name] ([id number]) -------------------------------------------------------------------------------- Patient/Caregiver Education Details Patient Name: Ashley [name], [name] Date of Service: 02/27/2018 12:45 PM Medical Record Number: [id number] Patient Account Number: [id number] Date of Birth/Gender: [birth year] (84 y.o. F) Treating RN: Roger Shelter Primary Care Physician: Frazier Richards Other Clinician: Referring Physician: Frazier Richards Treating Physician/Extender: Melburn Hake, HOYT Weeks in Treatment: 4 Education Assessment Education Provided To: Patient Education Topics Provided Wound/Skin Impairment: Handouts: Caring for Your Ulcer Methods: Explain/Verbal Responses: State content correctly Electronic Signature(s) Signed: 02/27/2018 4:46:50 PM By: Roger Shelter Entered By: Roger Shelter on 02/27/2018 13:29:26 Ashley [name], [name] ([id number]) -------------------------------------------------------------------------------- Wound Assessment Details Patient Name: Ashley [name], [name] Date of Service: 02/27/2018 12:45 PM Medical Record Number: [id number] Patient Account Number: [id number] Date of Birth/Sex: 1919-02-06 (84 y.o. F) Treating RN: Montey Hora Primary Care Kobi Aller: Frazier Richards Other Clinician: Referring Carole Doner: Frazier Richards Treating Delia Sitar/Extender: Melburn Hake, HOYT Weeks in Treatment: 4 Wound Status Wound Number: 2 Primary Diabetic Wound/Ulcer of the Lower Extremity Etiology: Wound Location: Right Lower Leg - Posterior Secondary Arterial  Insufficiency Ulcer Wounding Event: Not Known Etiology: Date Acquired: 10/28/2017 Wound Status: Open Weeks Of Treatment: 4 Comorbid Hypertension, Peripheral Arterial Disease, Clustered Wound: No History:  Type II Diabetes, End Stage Renal Disease Pending Amputation On Presentation Photos Photo Uploaded By: Montey Hora on 02/27/2018 14:11:33 Wound Measurements Length: (cm) 1.4 Width: (cm) 0.2 Depth: (cm) 0.1 Area: (cm) 0.22 Volume: (cm) 0.022 % Reduction in Area: 96.7% % Reduction in Volume: 98.3% Epithelialization: None Tunneling: No Undermining: No Wound Description Classification: Grade 1 Wound Margin: Flat and Intact Exudate Amount: Small Exudate Type: Sanguinous Exudate Color: red Foul Odor After Cleansing: No Slough/Fibrino No Wound Bed Granulation Amount: Large (67-100%) Exposed Structure Granulation Quality: Red, Hyper-granulation Fascia Exposed: No Necrotic Amount: None Present (0%) Fat Layer (Subcutaneous Tissue) Exposed: Yes Tendon Exposed: No Muscle Exposed: No Joint Exposed: No Bone Exposed: No Periwound Skin Texture Ashley [name], [name] ([id number]) Texture Color No Abnormalities Noted: No No Abnormalities Noted: No Callus: No Atrophie Blanche: No Crepitus: No Cyanosis: No Excoriation: No Ecchymosis: No Induration: No Erythema: No Rash: No Hemosiderin Staining: No Scarring: No Mottled: No Pallor: No Moisture Rubor: No No Abnormalities Noted: No Dry / Scaly: No Temperature / Pain Maceration: No Temperature: No Abnormality Wound Preparation Ulcer Cleansing: Rinsed/Irrigated with Saline Topical Anesthetic Applied: Other: lidocaine 4%, Electronic Signature(s) Signed: 02/27/2018 5:10:46 PM By: Montey Hora Entered By: Montey Hora on 02/27/2018 12:56:41 Ashley [name], [name] ([id number]) -------------------------------------------------------------------------------- Vitals Details Patient Name: Steinhaus, Virgene P. Date of Service: 02/27/2018 12:45 PM Medical Record Number: [id number] Patient Account Number: [id number] Date of Birth/Sex: [birth year] (84 y.o. F) Treating RN: Roger Shelter Primary Care Taino Maertens: Frazier Richards Other  Clinician: Referring Garritt Molyneux: Frazier Richards Treating Anavey Coombes/Extender: Melburn Hake, HOYT Weeks in Treatment: 4 Vital Signs Time Taken: 12:45 Temperature (F): 97.7 Pulse (bpm): 75 Respiratory Rate (breaths/min): 18 Blood Pressure (mmHg): 163/74 Reference Range: 80 - 120 mg / dl Electronic Signature(s) Signed: 02/27/2018 4:01:29 PM By: Lorine Bears RCP, RRT, CHT Entered By: Lorine Bears on 02/27/2018 12:47:11

## 2018-03-11 ENCOUNTER — Telehealth (INDEPENDENT_AMBULATORY_CARE_PROVIDER_SITE_OTHER): Payer: Self-pay

## 2018-03-11 NOTE — Telephone Encounter (Signed)
Nurse called and wanted to know if the patient should be scheduled to come in to have her sutures removed, or if you would like for them to remove them and when should this be done?

## 2018-03-13 ENCOUNTER — Encounter: Payer: Medicare Other | Admitting: Physician Assistant

## 2018-03-13 ENCOUNTER — Encounter (INDEPENDENT_AMBULATORY_CARE_PROVIDER_SITE_OTHER): Payer: Self-pay | Admitting: Nurse Practitioner

## 2018-03-13 ENCOUNTER — Ambulatory Visit (INDEPENDENT_AMBULATORY_CARE_PROVIDER_SITE_OTHER): Payer: Medicare Other | Admitting: Nurse Practitioner

## 2018-03-13 VITALS — BP 102/78 | HR 88 | Resp 19 | Ht 62.0 in | Wt 137.0 lb

## 2018-03-13 DIAGNOSIS — N186 End stage renal disease: Secondary | ICD-10-CM | POA: Diagnosis not present

## 2018-03-13 DIAGNOSIS — I739 Peripheral vascular disease, unspecified: Secondary | ICD-10-CM

## 2018-03-13 DIAGNOSIS — K219 Gastro-esophageal reflux disease without esophagitis: Secondary | ICD-10-CM

## 2018-03-13 DIAGNOSIS — I70232 Atherosclerosis of native arteries of right leg with ulceration of calf: Secondary | ICD-10-CM | POA: Diagnosis not present

## 2018-03-13 DIAGNOSIS — L97909 Non-pressure chronic ulcer of unspecified part of unspecified lower leg with unspecified severity: Secondary | ICD-10-CM

## 2018-03-13 DIAGNOSIS — I10 Essential (primary) hypertension: Secondary | ICD-10-CM | POA: Diagnosis not present

## 2018-03-13 DIAGNOSIS — I120 Hypertensive chronic kidney disease with stage 5 chronic kidney disease or end stage renal disease: Secondary | ICD-10-CM | POA: Diagnosis not present

## 2018-03-13 DIAGNOSIS — Z89421 Acquired absence of other right toe(s): Secondary | ICD-10-CM | POA: Diagnosis not present

## 2018-03-13 DIAGNOSIS — E1122 Type 2 diabetes mellitus with diabetic chronic kidney disease: Secondary | ICD-10-CM | POA: Diagnosis not present

## 2018-03-13 DIAGNOSIS — E11622 Type 2 diabetes mellitus with other skin ulcer: Secondary | ICD-10-CM | POA: Diagnosis not present

## 2018-03-13 DIAGNOSIS — L97212 Non-pressure chronic ulcer of right calf with fat layer exposed: Secondary | ICD-10-CM | POA: Diagnosis not present

## 2018-03-15 NOTE — Progress Notes (Signed)
Ashley [name] ([id number]) Visit Report for 03/13/2018 Chief Complaint Document Details Patient Name: Ashley [name], [name] Date of Service: 03/13/2018 2:15 PM Medical Record Number: [id number] Patient Account Number: [id number] Date of Birth/Sex: [birth year] (84 y.o. F) Treating RN: Cornell Barman Primary Care Provider: Frazier Richards Other Clinician: Referring Provider: Frazier Richards Treating Provider/Extender: Melburn Hake, HOYT Weeks in Treatment: 6 Information Obtained from: Patient Chief Complaint Right posterior calf Electronic Signature(s) Signed: 03/13/2018 5:08:41 PM By: Worthy Keeler PA-C Entered By: Worthy Keeler on 03/13/2018 14:51:21 Ashley [name], [name] ([id number]) -------------------------------------------------------------------------------- HPI Details Patient Name: [name], [name] Date of Service: 03/13/2018 2:15 PM Medical Record Number: [id number] Patient Account Number: [id number] Date of Birth/Sex: 04/30/1919 (84 y.o. F) Treating RN: Cornell Barman Primary Care Provider: Frazier Richards Other Clinician: Referring Provider: Frazier Richards Treating Provider/Extender: Melburn Hake, HOYT Weeks in Treatment: 6 History of Present Illness HPI Description: 01/30/18-She is seen in initial evaluation for a right plantar calf ulcer. She is being treated for both the calf ulcer and a right second toe ulcer; the right second toe is planned for amputation on 9/19. The facility has been treating both ulcers with santyl. On 8/12 she underwent angiogram of the right lower extremity with angioplasty to the posterior tibial, right mid distal SFA, popliteal artery with stent placement to the right popliteal artery; post-procedure ABI-non-compressible bilaterally, RIGHT TBI 0.25, LEFT TBI 0.55. The right posterior calf ulcer is healthy with minimal amount of non-viable tissue remaining. We will continue with santyl, with expectation to change treatment plan next week. She is a diabetic  with last A1c 7.6 (01/2018) 02/07/18 on evaluation today patient actually appears to be showing signs of good improvement in regard to the right posterior lower extremity. With that being said the right second toe ulcer is actually plan for amputation at this point secondary to infection and osteomyelitis. This is actually scheduled for next Thursday. With that being said her wound seems to be healing quite nicely in regard to the posterior ulcer at this time she does have some Slough noted. That's in regard to lower extremity. 02/20/18 on evaluation today patient presents after having had invitation of her right second toe which was performed this past Thursday, when we could go by Dr. dew. Fortunately that seems to be doing fairly well although he's managing that we're not really do anything in that regard at this time. In regard to her posterior ulcer she actually appears to be doing very well. This infection is an excellent granulation bed there's really no slough noted on the surface of the wound at this point I do believe the Santyl has done its job currently. I think were ready to switch to something different. 02/27/18 on evaluation today patient actually appears to be showing signs of great improvement in regard to her lower extremity ulcer. In fact this is almost completely healed. I'm very pleased with how things seem to be progressing. 03/13/18 on evaluation today patient actually appears to be doing very well in regard to her wound in fact this appears to be completely healed. With that being said she does have a rash around the area where the wound was I believe this is likely due to the bandaging and there is really no open wound just some irritation in this area. Overall she is doing very well. She did see Dr. dew today in regard to her toe amputation site that appears to be doing well the sutures were taken out at this point. Electronic Signature(s)  Signed: 03/13/2018 5:08:41 PM By:  Worthy Keeler PA-C Entered By: Worthy Keeler on 03/13/2018 14:55:22 Ashley [name], [name] ([id number]) -------------------------------------------------------------------------------- Physical Exam Details Patient Name: [name], [name] Date of Service: 03/13/2018 2:15 PM Medical Record Number: [id number] Patient Account Number: [id number] Date of Birth/Sex: [birth year] (84 y.o. F) Treating RN: Cornell Barman Primary Care Provider: Frazier Richards Other Clinician: Referring Provider: Frazier Richards Treating Provider/Extender: Melburn Hake, HOYT Weeks in Treatment: 6 Constitutional Well-nourished and well-hydrated in no acute distress. Respiratory normal breathing without difficulty. clear to auscultation bilaterally. Cardiovascular regular rate and rhythm with normal S1, S2. Psychiatric this patient is able to make decisions and demonstrates good insight into disease process. Alert and Oriented x 3. pleasant and cooperative. Notes Patient's wound bed currently shows complete epithelialization. There does not appear to be any evidence of infection at this time. With that being said with a rash noted around her ankle I do believe that she would respond well to treatment with a steroid cream to help relieve this irritation. My suggestion would be this to be mixed one to one with a moisturizing lotion such as Eucerin be applied to the area. Electronic Signature(s) Signed: 03/13/2018 5:08:41 PM By: Worthy Keeler PA-C Entered By: Worthy Keeler on 03/13/2018 14:56:55 Ashley [name], [name] ([id number]) -------------------------------------------------------------------------------- Physician Orders Details Patient Name: [name], [name] Date of Service: 03/13/2018 2:15 PM Medical Record Number: [id number] Patient Account Number: [id number] Date of Birth/Sex: 28-Mar-1919 (84 y.o. F) Treating RN: Cornell Barman Primary Care Provider: Frazier Richards Other Clinician: Referring Provider: Frazier Richards Treating Provider/Extender: Melburn Hake, HOYT Weeks in Treatment: 6 Verbal / Phone Orders: No Diagnosis Coding Patient Medications Allergies: No Known Allergies Notifications Medication Indication Start End triamcinolone acetonide 03/11/2018 DOSE topical 0.1 % cream - cream topical mixed 1:1 with lotion and then applied to the rash on the right ankle daily for 2 weeks then D/C Electronic Signature(s) Signed: 03/13/2018 5:08:41 PM By: Worthy Keeler PA-C Entered By: Worthy Keeler on 03/13/2018 14:52:52 Ashley [name], [name] ([id number]) -------------------------------------------------------------------------------- Prescription 03/13/2018 Patient Name: Oquinn, Levora P. Provider: Worthy Keeler PA-C Date of Birth: 19-Sep-1918 NPI#: [id number] Sex: F DEA#: [id number] Phone #: [phone] License #: Patient Address: [facility] [place], [place] [address] [address] [place], [place] [address] [phone] Allergies No Known Allergies Medication Medication: Route: Strength: Form: triamcinolone acetonide topical 0.1 % cream Class: TOPICAL ANTI-INFLAMMATORY STEROIDAL Dose: Frequency / Time: Indication: cream topical mixed 1:1 with lotion and then applied to the rash on the right ankle daily for 2 weeks then D/C Number of Refills: Number of Units: 0 Sixty (60) Gram(s) Generic Substitution: Start Date: End Date: Administered at Substitution Permitted 02/63/7858 Facility: No Note to Pharmacy: Signature(s): Date(s): Electronic Signature(s) Signed: 03/13/2018 5:08:41 PM By: Worthy Keeler PA-C Entered By: Worthy Keeler on 03/13/2018 14:52:52 Ashley [name], [name] ([id number]) --------------------------------------------------------------------------------  Problem List Details Patient Name: Ashley [name], [name] Date of Service: 03/13/2018 2:15 PM Medical Record  Number: [id number] Patient Account Number: [id number] Date of Birth/Sex: Sep 25, 1918 (84 y.o. F) Treating RN: Cornell Barman Primary Care Provider: Frazier Richards Other Clinician: Referring Provider: Frazier Richards Treating Provider/Extender: Melburn Hake, HOYT Weeks in Treatment: 6 Active Problems ICD-10 Evaluated Encounter Code Description Active Date Today Diagnosis E11.622 Type 2 diabetes mellitus with other skin ulcer 01/30/2018 No Yes I70.232 Atherosclerosis of native arteries of right leg with ulceration of 01/30/2018 No Yes calf L97.212 Non-pressure  chronic ulcer of right calf with fat layer exposed 01/30/2018 No Yes Inactive Problems Resolved Problems Electronic Signature(s) Signed: 03/13/2018 5:08:41 PM By: Worthy Keeler PA-C Entered By: Worthy Keeler on 03/13/2018 14:51:16 Bessire, Karyssa P. ([id number]) -------------------------------------------------------------------------------- Progress Note Details Patient Name: Ashley [name], [name] Date of Service: 03/13/2018 2:15 PM Medical Record Number: [id number] Patient Account Number: [id number] Date of Birth/Sex: 1918-11-15 (84 y.o. F) Treating RN: Cornell Barman Primary Care Provider: Frazier Richards Other Clinician: Referring Provider: Frazier Richards Treating Provider/Extender: Melburn Hake, HOYT Weeks in Treatment: 6 Subjective Chief Complaint Information obtained from Patient Right posterior calf History of Present Illness (HPI) 01/30/18-She is seen in initial evaluation for a right plantar calf ulcer. She is being treated for both the calf ulcer and a right second toe ulcer; the right second toe is planned for amputation on 9/19. The facility has been treating both ulcers with santyl. On 8/12 she underwent angiogram of the right lower extremity with angioplasty to the posterior tibial, right mid distal SFA, popliteal artery with stent placement to the right popliteal artery; post-procedure ABI-non-compressible bilaterally, RIGHT  TBI 0.25, LEFT TBI 0.55. The right posterior calf ulcer is healthy with minimal amount of non-viable tissue remaining. We will continue with santyl, with expectation to change treatment plan next week. She is a diabetic with last A1c 7.6 (01/2018) 02/07/18 on evaluation today patient actually appears to be showing signs of good improvement in regard to the right posterior lower extremity. With that being said the right second toe ulcer is actually plan for amputation at this point secondary to infection and osteomyelitis. This is actually scheduled for next Thursday. With that being said her wound seems to be healing quite nicely in regard to the posterior ulcer at this time she does have some Slough noted. That's in regard to lower extremity. 02/20/18 on evaluation today patient presents after having had invitation of her right second toe which was performed this past Thursday, when we could go by Dr. dew. Fortunately that seems to be doing fairly well although he's managing that we're not really do anything in that regard at this time. In regard to her posterior ulcer she actually appears to be doing very well. This infection is an excellent granulation bed there's really no slough noted on the surface of the wound at this point I do believe the Santyl has done its job currently. I think were ready to switch to something different. 02/27/18 on evaluation today patient actually appears to be showing signs of great improvement in regard to her lower extremity ulcer. In fact this is almost completely healed. I'm very pleased with how things seem to be progressing. 03/13/18 on evaluation today patient actually appears to be doing very well in regard to her wound in fact this appears to be completely healed. With that being said she does have a rash around the area where the wound was I believe this is likely due to the bandaging and there is really no open wound just some irritation in this area. Overall  she is doing very well. She did see Dr. dew today in regard to her toe amputation site that appears to be doing well the sutures were taken out at this point. Patient History Information obtained from Patient. Social History Never smoker, Marital Status - Widowed, Alcohol Use - Never, Drug Use - No History, Caffeine Use - Moderate. Medical And Surgical History Notes Ear/Nose/Mouth/Throat HOH Cardiovascular stent placed in right leg 3 weeks ago Hartline, Delories P. (  [id number]) Review of Systems (ROS) Constitutional Symptoms (General Health) Denies complaints or symptoms of Fever, Chills. Respiratory The patient has no complaints or symptoms. Cardiovascular The patient has no complaints or symptoms. Psychiatric The patient has no complaints or symptoms. Objective Constitutional Well-nourished and well-hydrated in no acute distress. Vitals Time Taken: 2:30 PM, Temperature: 97.6 F, Pulse: 83 bpm, Respiratory Rate: 18 breaths/min, Blood Pressure: 155/78 mmHg. Respiratory normal breathing without difficulty. clear to auscultation bilaterally. Cardiovascular regular rate and rhythm with normal S1, S2. Psychiatric this patient is able to make decisions and demonstrates good insight into disease process. Alert and Oriented x 3. pleasant and cooperative. General Notes: Patient's wound bed currently shows complete epithelialization. There does not appear to be any evidence of infection at this time. With that being said with a rash noted around her ankle I do believe that she would respond well to treatment with a steroid cream to help relieve this irritation. My suggestion would be this to be mixed one to one with a moisturizing lotion such as Eucerin be applied to the area. Integumentary (Hair, Skin) Wound #2 status is Healed - Epithelialized. Original cause of wound was Not Known. The wound is located on the Right,Posterior Lower Leg. The wound measures 0cm length x 0cm width x 0cm depth;  0cm^2 area and 0cm^3 volume. There is Fat Layer (Subcutaneous Tissue) Exposed exposed. There is no tunneling or undermining noted. There is a none present amount of drainage noted. The wound margin is flat and intact. There is no granulation within the wound bed. There is no necrotic tissue within the wound bed. The periwound skin appearance did not exhibit: Callus, Crepitus, Excoriation, Induration, Rash, Scarring, Dry/Scaly, Maceration, Atrophie Blanche, Cyanosis, Ecchymosis, Hemosiderin Staining, Mottled, Pallor, Rubor, Erythema. Periwound temperature was noted as No Abnormality. Assessment Ashley [name], [name] ([id number]) Active Problems ICD-10 Type 2 diabetes mellitus with other skin ulcer Atherosclerosis of native arteries of right leg with ulceration of calf Non-pressure chronic ulcer of right calf with fat layer exposed Plan The following medication(s) was prescribed: triamcinolone acetonide topical 0.1 % cream cream topical mixed 1:1 with lotion and then applied to the rash on the right ankle daily for 2 weeks then D/C starting 03/11/2018 I'm gonna recommend that we discontinue wound his services at this point. With that being said I do believe that she is doing extremely well in regard to the overall status of her wound and hopefully the toe is doing well as well. We will see her back in the future as needed if anything changes or worsens I'm gonna discontinue wound care services that I to get for prescription for the triamcinolone patient today to be initiated at the facility for the rational her right ankle. Electronic Signature(s) Signed: 03/13/2018 5:08:41 PM By: Worthy Keeler PA-C Entered By: Worthy Keeler on 03/13/2018 14:57:42 Ashley [name], Ashley [name] P. ([id number]) -------------------------------------------------------------------------------- ROS/PFSH Details Patient Name: Ashley [name], [name] Date of Service: 03/13/2018 2:15 PM Medical Record Number: [id number] Patient Account Number:  [id number] Date of Birth/Sex: Jan 05, 1919 (84 y.o. F) Treating RN: Cornell Barman Primary Care Provider: Frazier Richards Other Clinician: Referring Provider: Frazier Richards Treating Provider/Extender: Melburn Hake, HOYT Weeks in Treatment: 6 Information Obtained From Patient Wound History Do you currently have one or more open woundso Yes How many open wounds do you currently haveo 2 Approximately how long have you had your woundso 3 months How have you been treating your wound(s) until nowo santyl Has your wound(s) ever healed and then re-openedo No Have you  had any lab work done in the past montho No Have you tested positive for an antibiotic resistant organism (MRSA, VRE)o No Have you tested positive for osteomyelitis (bone infection)o No Have you had any tests for circulation on your legso Yes Where was the test doneo [facility] Constitutional Symptoms (General Health) Complaints and Symptoms: Negative for: Fever; Chills Eyes Medical History: Negative for: Cataracts; Glaucoma; Optic Neuritis Ear/Nose/Mouth/Throat Medical History: Negative for: Chronic sinus problems/congestion; Middle ear problems Past Medical History Notes: HOH Hematologic/Lymphatic Medical History: Negative for: Anemia; Hemophilia; Human Immunodeficiency Virus; Lymphedema; Sickle Cell Disease Respiratory Complaints and Symptoms: No Complaints or Symptoms Medical History: Negative for: Aspiration; Asthma; Chronic Obstructive Pulmonary Disease (COPD); Pneumothorax; Sleep Apnea; Tuberculosis Cardiovascular Complaints and Symptoms: No Complaints or Symptoms Ashley [name], [name] ([id number]) Medical History: Positive for: Hypertension; Peripheral Arterial Disease Negative for: Angina; Arrhythmia; Congestive Heart Failure; Coronary Artery Disease; Deep Vein Thrombosis; Hypotension; Myocardial Infarction; Peripheral Venous Disease; Phlebitis; Vasculitis Past Medical History Notes: stent placed in right leg 3 weeks  ago Gastrointestinal Medical History: Negative for: Cirrhosis ; Colitis; Crohnos; Hepatitis A; Hepatitis B; Hepatitis C Endocrine Medical History: Positive for: Type II Diabetes Negative for: Type I Diabetes Genitourinary Medical History: Positive for: End Stage Renal Disease - CKD stage 3 Immunological Medical History: Negative for: Lupus Erythematosus; Raynaudos; Scleroderma Integumentary (Skin) Medical History: Negative for: History of Burn; History of pressure wounds Musculoskeletal Medical History: Negative for: Gout; Rheumatoid Arthritis; Osteoarthritis; Osteomyelitis Neurologic Medical History: Negative for: Dementia; Neuropathy; Quadriplegia; Paraplegia; Seizure Disorder Oncologic Medical History: Negative for: Received Chemotherapy; Received Radiation Psychiatric Complaints and Symptoms: No Complaints or Symptoms Medical History: Negative for: Anorexia/bulimia; Confinement Anxiety Immunizations Pneumococcal Vaccine: Ashley [name], [name] ([id number]) Received Pneumococcal Vaccination: Yes Implantable Devices Family and Social History Never smoker; Marital Status - Widowed; Alcohol Use: Never; Drug Use: No History; Caffeine Use: Moderate; Financial Concerns: No; Food, Clothing or Shelter Needs: No; Support System Lacking: No; Transportation Concerns: No; Medical Power of Attorney: Yes - Angus Seller - nephew (Not Provided) Physician Affirmation I have reviewed and agree with the above information. Electronic Signature(s) Signed: 03/13/2018 5:03:58 PM By: Gretta Cool, BSN, RN, CWS, Kim RN, BSN Signed: 03/13/2018 5:08:41 PM By: Worthy Keeler PA-C Entered By: Worthy Keeler on 03/13/2018 14:56:32 Gunnerson, Shalla P. ([id number]) -------------------------------------------------------------------------------- SuperBill Details Patient Name: Vivero, Xandra P. Date of Service: 03/13/2018 Medical Record Number: [id number] Patient Account Number: [id number] Date of Birth/Sex: 19-Apr-1919  (84 y.o. F) Treating RN: Cornell Barman Primary Care Provider: Frazier Richards Other Clinician: Referring Provider: Frazier Richards Treating Provider/Extender: Melburn Hake, HOYT Weeks in Treatment: 6 Diagnosis Coding ICD-10 Codes Code Description E11.622 Type 2 diabetes mellitus with other skin ulcer I70.232 Atherosclerosis of native arteries of right leg with ulceration of calf L97.212 Non-pressure chronic ulcer of right calf with fat layer exposed Facility Procedures CPT4 Code: [id number] Description: [phone] - WOUND CARE VISIT-LEV 2 EST PT Modifier: Quantity: 1 Physician Procedures CPT4 Code: [id number] Description: 09470 - WC PHYS LEVEL 3 - EST PT ICD-10 Diagnosis Description E11.622 Type 2 diabetes mellitus with other skin ulcer I70.232 Atherosclerosis of native arteries of right leg with ulceratio L97.212 Non-pressure chronic ulcer of right calf with  fat layer expose Modifier: n of calf d Quantity: 1 Electronic Signature(s) Signed: 03/13/2018 5:08:41 PM By: Worthy Keeler PA-C Entered By: Worthy Keeler on 03/13/2018 14:57:52

## 2018-03-15 NOTE — Progress Notes (Addendum)
BENTLEY, HARALSON ([id number]) Visit Report for 03/13/2018 Arrival Information Details Patient Name: [name], [name] Date of Service: 03/13/2018 2:15 PM Medical Record Number: [id number] Patient Account Number: [id number] Date of Birth/Sex: 09-22-1918 (84 y.o. F) Treating RN: Secundino Ginger Primary Care Jamile Sivils: Frazier Richards Other Clinician: Referring Tedra Coppernoll: Frazier Richards Treating Grisela Mesch/Extender: Melburn Hake, HOYT Weeks in Treatment: 6 Visit Information History Since Last Visit Added or deleted any medications: No Patient Arrived: Wheel Chair Any new allergies or adverse reactions: No Arrival Time: 14:23 Had a fall or experienced change in No Accompanied By: nephew [name] activities of daily living that may affect Transfer Assistance: EasyPivot Patient risk of falls: Lift Signs or symptoms of abuse/neglect since last visito No Patient Identification Verified: Yes Hospitalized since last visit: No Secondary Verification Process Yes Implantable device outside of the clinic excluding No Completed: cellular tissue based products placed in the center Patient Has Alerts: Yes since last visit: Patient Alerts: ABI [place] BILATERAL Has Dressing in Place as Prescribed: Yes AVVS 01/17/18 Pain Present Now: No TBI L .55 R .25 Electronic Signature(s) Signed: 03/13/2018 4:02:04 PM By: Secundino Ginger Entered By: Secundino Ginger on 03/13/2018 14:30:26 [name], [name] ([id number]) -------------------------------------------------------------------------------- Clinic Level of Care Assessment Details Patient Name: [name], [name] Date of Service: 03/13/2018 2:15 PM Medical Record Number: [id number] Patient Account Number: [id number] Date of Birth/Sex: 1918-08-06 (84 y.o. F) Treating RN: Cornell Barman Primary Care Disney Ruggiero: Frazier Richards Other Clinician: Referring Narda Fundora: Frazier Richards Treating Lexus Shampine/Extender: Melburn Hake, HOYT Weeks in Treatment: 6 Clinic Level of Care Assessment  Items TOOL 4 Quantity Score []  - Use when only an EandM is performed on FOLLOW-UP visit 0 ASSESSMENTS - Nursing Assessment / Reassessment []  - Reassessment of Co-morbidities (includes updates in patient status) 0 X- 1 5 Reassessment of Adherence to Treatment Plan ASSESSMENTS - Wound and Skin Assessment / Reassessment X - Simple Wound Assessment / Reassessment - one wound 1 5 []  - 0 Complex Wound Assessment / Reassessment - multiple wounds X- 1 10 Dermatologic / Skin Assessment (not related to wound area) ASSESSMENTS - Focused Assessment []  - Circumferential Edema Measurements - multi extremities 0 []  - 0 Nutritional Assessment / Counseling / Intervention []  - 0 Lower Extremity Assessment (monofilament, tuning fork, pulses) []  - 0 Peripheral Arterial Disease Assessment (using hand held doppler) ASSESSMENTS - Ostomy and/or Continence Assessment and Care []  - Incontinence Assessment and Management 0 []  - 0 Ostomy Care Assessment and Management (repouching, etc.) PROCESS - Coordination of Care X - Simple Patient / Family Education for ongoing care 1 15 []  - 0 Complex (extensive) Patient / Family Education for ongoing care []  - 0 Staff obtains Programmer, systems, Records, Test Results / Process Orders []  - 0 Staff telephones HHA, Nursing Homes / Clarify orders / etc []  - 0 Routine Transfer to another Facility (non-emergent condition) []  - 0 Routine Hospital Admission (non-emergent condition) []  - 0 New Admissions / Biomedical engineer / Ordering NPWT, Apligraf, etc. []  - 0 Emergency Hospital Admission (emergent condition) X- 1 10 Simple Discharge Coordination [name], [name] ([id number]) []  - 0 Complex (extensive) Discharge Coordination PROCESS - Special Needs []  - Pediatric / Minor Patient Management 0 []  - 0 Isolation Patient Management []  - 0 Hearing / Language / Visual special needs []  - 0 Assessment of Community assistance (transportation, D/C planning, etc.) []  -  0 Additional assistance / Altered mentation []  - 0 Support Surface(s) Assessment (bed, cushion, seat, etc.) INTERVENTIONS - Wound Cleansing / Measurement []  - Simple Wound Cleansing -  one wound 0 []  - 0 Complex Wound Cleansing - multiple wounds X- 1 5 Wound Imaging (photographs - any number of wounds) []  - 0 Wound Tracing (instead of photographs) []  - 0 Simple Wound Measurement - one wound []  - 0 Complex Wound Measurement - multiple wounds INTERVENTIONS - Wound Dressings []  - Small Wound Dressing one or multiple wounds 0 []  - 0 Medium Wound Dressing one or multiple wounds []  - 0 Large Wound Dressing one or multiple wounds []  - 0 Application of Medications - topical []  - 0 Application of Medications - injection INTERVENTIONS - Miscellaneous []  - External ear exam 0 []  - 0 Specimen Collection (cultures, biopsies, blood, body fluids, etc.) []  - 0 Specimen(s) / Culture(s) sent or taken to Lab for analysis []  - 0 Patient Transfer (multiple staff / Civil Service fast streamer / Similar devices) []  - 0 Simple Staple / Suture removal (25 or less) []  - 0 Complex Staple / Suture removal (26 or more) []  - 0 Hypo / Hyperglycemic Management (close monitor of Blood Glucose) []  - 0 Ankle / Brachial Index (ABI) - do not check if billed separately X- 1 5 Vital Signs [name], [name] ([id number]) Has the patient been seen at the hospital within the last three years: Yes Total Score: 55 Level Of Care: New/Established - Level 2 Electronic Signature(s) Signed: 03/13/2018 5:03:58 PM By: Gretta Cool, BSN, RN, CWS, Kim RN, BSN Entered By: Gretta Cool, BSN, RN, CWS, Kim on 03/13/2018 14:49:34 [name], [name] ([id number]) -------------------------------------------------------------------------------- Encounter Discharge Information Details Patient Name: [name], [name] Date of Service: 03/13/2018 2:15 PM Medical Record Number: [id number] Patient Account Number: [id number] Date of Birth/Sex: 1919-01-13 (84 y.o.  F) Treating RN: Cornell Barman Primary Care Hurbert Duran: Frazier Richards Other Clinician: Referring Nika Yazzie: Frazier Richards Treating Kelon Easom/Extender: Melburn Hake, HOYT Weeks in Treatment: 6 Encounter Discharge Information Items Discharge Condition: Stable Ambulatory Status: Wheelchair Discharge Destination: Home Transportation: Private Auto Accompanied By: caregiver Schedule Follow-up Appointment: Yes Clinical Summary of Care: Electronic Signature(s) Signed: 03/13/2018 5:03:58 PM By: Gretta Cool, BSN, RN, CWS, Kim RN, BSN Entered By: Gretta Cool, BSN, RN, CWS, Kim on 03/13/2018 14:50:59 Nesler, Dorien P. ([id number]) -------------------------------------------------------------------------------- Lower Extremity Assessment Details Patient Name: [name], [name] Date of Service: 03/13/2018 2:15 PM Medical Record Number: [id number] Patient Account Number: [id number] Date of Birth/Sex: [birth year] (84 y.o. F) Treating RN: Secundino Ginger Primary Care Alyiah Ulloa: Frazier Richards Other Clinician: Referring Massimo Hartland: Frazier Richards Treating Sherlynn Tourville/Extender: Melburn Hake, HOYT Weeks in Treatment: 6 Electronic Signature(s) Signed: 03/13/2018 4:02:04 PM By: Secundino Ginger Entered By: Secundino Ginger on 03/13/2018 14:31:19 Shiffer, Saga P. ([id number]) -------------------------------------------------------------------------------- Multi Wound Chart Details Patient Name: [name], [name] Date of Service: 03/13/2018 2:15 PM Medical Record Number: [id number] Patient Account Number: [id number] Date of Birth/Sex: 1918-07-22 (84 y.o. 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] [N/A:N/A] Comorbid History: [2:Hypertension, Peripheral Arterial Disease, Type II Diabetes, End Stage Renal Disease] [N/A:N/A] Date Acquired: [2:10/28/2017] [N/A:N/A] Weeks of Treatment: [2:6] [N/A:N/A] Wound Status: [2:Open] [N/A:N/A] Pending Amputation on [2:Yes] [N/A:N/A] Presentation: Measurements L x W x D [2:0.1x0.1x0.1] [N/A:N/A] (cm) Area (cm) : [2:0.008] [N/A:N/A] Volume (cm) : [2:0.001] [N/A:N/A] %  Reduction in Area: [2:99.90%] [N/A:N/A] % Reduction in Volume: [2:99.90%] [N/A:N/A] Classification: [2:Grade 1] [N/A:N/A] Exudate Amount: [2:None Present] [N/A:N/A] Wound Margin: [2:Flat and Intact] [N/A:N/A] Granulation Amount: [2:None Present (0%)] [N/A:N/A] Necrotic Amount: [2:None Present (0%)] [N/A:N/A] Exposed Structures: [2:Fat Layer (Subcutaneous Tissue) Exposed: Yes Fascia: No Tendon: No Muscle: No Joint: No Bone: No] [N/A:N/A] Epithelialization: [2:None] [N/A:N/A] Periwound Skin Texture: [2:Excoriation: No Induration: No Callus: No] [N/A:N/A] Crepitus: No Rash: No Scarring: No Periwound Skin Moisture: Maceration: No N/A N/A Dry/Scaly: No Periwound Skin Color: Atrophie Blanche: No N/A N/A Cyanosis: No Ecchymosis: No Erythema: No Hemosiderin Staining: No Mottled: No Pallor: No Rubor: No Temperature: No Abnormality N/A N/A Tenderness on Palpation: No N/A N/A Wound Preparation: Ulcer Cleansing: N/A N/A Rinsed/Irrigated with Saline Treatment Notes Electronic Signature(s) Signed: 03/13/2018 5:03:58 PM By: Gretta Cool, BSN, RN, CWS, Kim RN, BSN Entered By: Gretta Cool, BSN, RN, CWS, Kim on 03/13/2018 14:46:03 Kruse, Sirinity P. ([id number]) -------------------------------------------------------------------------------- [place] Details Patient Name: [name], [name] Date of Service: 03/13/2018 2:15 PM Medical Record Number: [id number] Patient Account Number: [id number] Date of Birth/Sex: [birth year] (84 y.o. F) Treating RN: Cornell Barman Primary Care Sheyna Pettibone: Frazier Richards Other Clinician: Referring Raimundo Corbit: Frazier Richards Treating Britanni Yarde/Extender: Melburn Hake, HOYT Weeks in Treatment: 6 Active Inactive Electronic Signature(s) Signed: 04/03/2018 5:32:21 PM By: Gretta Cool, BSN, RN, CWS, Kim RN, BSN Previous Signature: 03/13/2018 5:03:58 PM Version By: Gretta Cool, BSN, RN, CWS, Kim RN, BSN Entered By: Gretta Cool, BSN, RN, CWS, Kim on 04/03/2018 17:32:21 [name], [name] ([id number]) -------------------------------------------------------------------------------- Pain Assessment Details Patient Name: [name], [name] Date of Service: 03/13/2018 2:15 PM Medical Record Number: [id number] Patient Account Number: [id number] Date of Birth/Sex: December 02, 1918 (84 y.o. F) Treating RN: Secundino Ginger Primary Care Pailynn Vahey: Frazier Richards Other Clinician: Referring Ziva Nunziata: Frazier Richards Treating Kennis Buell/Extender: Melburn Hake, HOYT Weeks in Treatment: 6 Active Problems Location of Pain Severity and Description of Pain Patient Has Paino No Site Locations Pain Management and Medication Current Pain Management: Goals for Pain Management pt denies any pain at this time. Electronic Signature(s) Signed: 03/13/2018 4:02:04 PM By: Secundino Ginger Entered By: Secundino Ginger on 03/13/2018 14:30:45 [name], [name] ([id number]) -------------------------------------------------------------------------------- Patient/Caregiver Education Details Patient Name: [name], [name] Date of Service: 03/13/2018 2:15 PM Medical Record Number: [id number] Patient Account Number: [id number] Date of Birth/Gender: April 01, 1919 (84 y.o. F) Treating RN: Cornell Barman Primary Care Physician: Frazier Richards Other Clinician: Referring Physician: Frazier Richards Treating Physician/Extender: Melburn Hake, HOYT Weeks in Treatment: 6 Education Assessment Education Provided To: Caregiver Education Topics Provided Basic Hygiene: Handouts: Other: apply cream to rash once daily Methods: Demonstration, Explain/Verbal Responses: State content correctly Electronic Signature(s) Signed: 03/13/2018 5:03:58 PM By: Gretta Cool, BSN, RN, CWS, Kim RN, BSN Entered By: Gretta Cool, BSN, RN, CWS, Kim on 03/13/2018 14:50:05 [name], [name] ([id number]) -------------------------------------------------------------------------------- Wound Assessment Details Patient Name: Spath, Denesha P. Date of Service: 03/13/2018 2:15 PM Medical Record Number: [id number] Patient Account Number: [id number] Date of Birth/Sex: 04/08/1919 (84 y.o. F) Treating RN: Cornell Barman Primary Care Daveigh Batty: Frazier Richards Other Clinician: Referring Perel Hauschild: Frazier Richards Treating Othell Diluzio/Extender: Melburn Hake, HOYT Weeks in Treatment: 6 Wound Status Wound Number: 2 Primary Diabetic Wound/Ulcer of the Lower Extremity Etiology: Wound Location: Right, Posterior Lower Leg Secondary Arterial Insufficiency Ulcer Wounding Event: Not Known Etiology: Date Acquired: 10/28/2017 Wound Status: Healed - Epithelialized Weeks Of Treatment: 6 Comorbid Hypertension, Peripheral Arterial Disease, Clustered Wound: No History: Type II Diabetes, End Stage Renal Disease Pending Amputation On Presentation Photos Photo Uploaded By: Secundino Ginger on 03/13/2018 15:07:43 Wound Measurements  Length: (cm) 0 % Reduc Width: (cm) 0 % Reduc Depth: (cm) 0 Epithel Area: (cm) 0 Tunnel Volume: (cm) 0 Underm tion in Area: 100% tion in Volume: 100% ialization: None ing: No ining: No Wound Description Classification: Grade 1 Foul O Wound Margin: Flat and Intact Slough Exudate Amount: None Present dor After Cleansing: No /Fibrino No Wound Bed Granulation Amount: None Present (0%) Exposed Structure Necrotic  Amount: None Present (0%) Fascia Exposed: No Fat Layer (Subcutaneous Tissue) Exposed: Yes Tendon Exposed: No Muscle Exposed: No Joint Exposed: No Bone Exposed: No Periwound Skin Texture Texture Color Pavlik, Fatema P. ([id number]) No Abnormalities Noted: No No Abnormalities Noted: No Callus: No Atrophie Blanche: No Crepitus: No Cyanosis: No Excoriation: No Ecchymosis: No Induration: No Erythema: No Rash: No Hemosiderin Staining: No Scarring: No Mottled: No Pallor: No Moisture Rubor: No No Abnormalities Noted: No Dry / Scaly: No Temperature / Pain Maceration: No Temperature: No Abnormality Wound Preparation Ulcer Cleansing: Rinsed/Irrigated with Saline Electronic Signature(s) Signed: 03/13/2018 5:03:58 PM By: Gretta Cool, BSN, RN, CWS, Kim RN, BSN Entered By: Gretta Cool, BSN, RN, CWS, Kim on 03/13/2018 14:50:35 Abt, Starlette P. ([id number]) -------------------------------------------------------------------------------- Vitals Details Patient Name: Mindel, Rhesa P. Date of Service: 03/13/2018 2:15 PM Medical Record Number: [id number] Patient Account Number: [id number] Date of Birth/Sex: 1918/12/02 (84 y.o. F) Treating RN: Secundino Ginger Primary Care Ameliarose Shark: Frazier Richards Other Clinician: Referring Jahvier Aldea: Frazier Richards Treating Mata Rowen/Extender: Melburn Hake, HOYT Weeks in Treatment: 6 Vital Signs Time Taken: 14:30 Temperature (F): 97.6 Pulse (bpm): 83 Respiratory Rate (breaths/min): 18 Blood Pressure (mmHg): 155/78 Reference Range: 80 - 120 mg / dl Electronic Signature(s) Signed: 03/13/2018 4:02:04 PM By: Secundino Ginger Entered By: Secundino Ginger on 03/13/2018 14:31:11

## 2018-03-16 ENCOUNTER — Encounter (INDEPENDENT_AMBULATORY_CARE_PROVIDER_SITE_OTHER): Payer: Self-pay | Admitting: Nurse Practitioner

## 2018-03-16 NOTE — Progress Notes (Signed)
Subjective:    Patient ID: Ashley [name], female    DOB: 01-Aug-1918, 84 y.o.   MRN: [id number] Chief Complaint  Patient presents with  . Follow-up    Suture removal    HPI  Ashley [name] is a 84 y.o. female presents today for suture removal following right third toe amputation.  The area is well approximated.  There is no redness or drainage.  The wound on her right lateral ankle is also healing well per her nephew [name].  She denies any fever, chills, nausea, vomiting, diarrhea.  She denies any chest pain or shortness of breath.  Past Medical History:  Diagnosis Date  . Acute gouty arthritis 11/21/2016  . Aortic atherosclerosis ([place]) 09/04/2016  . Carotid artery disease ([place]) 11/21/2016  . Carotid stenosis 12/03/2015   Advanced heavily calcified atherosclerotic disease at both carotid bifurcations. Stenoses measured at 70% affecting the right proximal ICA and 80% affecting the left proximal ICA.  Marland Kitchen Chronic kidney disease   . Coronary atherosclerosis 09/04/2016  . Diverticulitis   . DM (diabetes mellitus), type 2 ([place]) 09/04/2016  . GERD (gastroesophageal reflux disease)   . Gout   . HTN, goal below 140/90 11/21/2016  . Hyperlipidemia 06/20/2016  . Hypothyroidism, unspecified 11/21/2016  . Primary osteoarthritis 11/21/2016   involving multiple joints  . Stroke [facility])     Social History   Socioeconomic History  . Marital status: Widowed    Spouse name: Not on file  . Number of children: 0  . Years of education: 10  . Highest education level: 10th grade  Occupational History  . Not on file  Social Needs  . Financial resource strain: Not on file  . Food insecurity:    Worry: Not on file    Inability: Not on file  . Transportation needs:    Medical: Not on file    Non-medical: Not on file  Tobacco Use  . Smoking status: Never Smoker  . Smokeless tobacco: Never Used  Substance and Sexual Activity  . Alcohol use: No  . Drug use: No  . Sexual activity: Not Currently    Lifestyle  . Physical activity:    Days per week: Not on file    Minutes per session: Not on file  . Stress: Not on file  Relationships  . Social connections:    Talks on phone: Not on file    Gets together: Not on file    Attends religious service: Not on file    Active member of club or organization: Not on file    Attends meetings of clubs or organizations: Not on file    Relationship status: Not on file  . Intimate partner violence:    Fear of current or ex partner: Not on file    Emotionally abused: Not on file    Physically abused: Not on file    Forced sexual activity: Not on file  Other Topics Concern  . Not on file  Social History Narrative   Admitted to [facility] 11/16/2016   Widowed   No children   Does not drink alcohol   Never smoker   DNR    Past Surgical History:  Procedure Laterality Date  . AMPUTATION Right 02/13/2018   Procedure: AMPUTATION RAY ( 2ND TOE );  Surgeon: [name], MD;  Location: [facility] ORS;  Service: Vascular;  Laterality: Right;  . CATARACT EXTRACTION Bilateral   . COLONOSCOPY    . Finger crystal removal  right 3rd  . LOWER EXTREMITY ANGIOGRAPHY Right 01/06/2018   Procedure: LOWER EXTREMITY ANGIOGRAPHY;  Surgeon: [name], MD;  Location: [place] [facility];  Service: Cardiovascular;  Laterality: Right;  . toe crystal removal     right 1st toe    Family History  Problem Relation Age of Onset  . Hypertension Sister   . Arthritis Sister   . Heart disease Sister   . Alcohol abuse Brother   . Stroke Brother   . Hypertension Brother   . Alcohol abuse Brother   . Stroke Brother   . Hypertension Brother   . Hypertension Brother   . Hypertension Sister     No Known Allergies   Review of Systems   Review of Systems: Negative Unless Checked Constitutional: [] Weight loss  [] Fever  [] Chills Cardiac: [] Chest pain   []  Atrial Fibrillation  [] Palpitations   [] Shortness of breath when laying flat   [] Shortness of  breath with exertion. Vascular:  [] Pain in legs with walking   [] Pain in legs with standing  [] History of DVT   [] Phlebitis   [] Swelling in legs   [] Varicose veins   [x] Non-healing ulcers Pulmonary:   [] Uses home oxygen   [] Productive cough   [] Hemoptysis   [] Wheeze  [] COPD   [] Asthma Neurologic:  [] Dizziness   [] Seizures   [] History of stroke   [] History of TIA  [] Aphasia   [] Vissual changes   [] Weakness or numbness in arm   [x] Weakness or numbness in leg Musculoskeletal:   [] Joint swelling   [] Joint pain   [] Low back pain  []  History of Knee Replacement Hematologic:  [] Easy bruising  [] Easy bleeding   [] Hypercoagulable state   [] Anemic Gastrointestinal:  [] Diarrhea   [] Vomiting  [x] Gastroesophageal reflux/heartburn   [] Difficulty swallowing. Genitourinary:  [] Chronic kidney disease   [] Difficult urination  [] Anuric   [] Blood in urine Skin:  [] Rashes   [x] Ulcers  Psychological:  [] History of anxiety   []  History of major depression  []  Memory Difficulties     Objective:   Physical Exam  BP 102/78 (BP Location: Left Arm, Patient Position: Sitting)   Pulse 88   Resp 19   Ht 5\' 2"  (1.575 m)   Wt 137 lb (62.1 kg)   BMI 25.06 kg/m   Gen: WD/WN, NAD Head: [place]/AT, No temporalis wasting.  Ear/Nose/Throat: Hearing grossly intact, nares w/o erythema or drainage Eyes: PER, EOMI, sclera nonicteric.  Neck: Supple, no masses.  No JVD.  Pulmonary:  Good air movement, no use of accessory muscles.  Cardiac: RRR Vascular:  Vessel Right Left  Radial Palpable Palpable  Dorsalis Pedis Palpable Palpable  Posterior Tibial Palpable Palpable   Gastrointestinal: soft, non-distended. No guarding/no peritoneal signs.  Musculoskeletal: Weakness bilateral lower extremities, right third toe amputation  Neurologic: Pain and light touch intact in extremities.  Symmetrical.  Speech is fluent. Motor exam as listed above. Psychiatric: Judgment intact, Mood & affect appropriate for pt's clinical  situation. Dermatologic: No Venous rashes.Ulcer on right lower extremity.  No changes consistent with cellulitis. Lymph : No Cervical lymphadenopathy, no lichenification or skin changes of chronic lymphedema.      Assessment & Plan:   1. Peripheral vascular disease of lower extremity with ulceration (HCC) Third toe amputation appears to be healing well.  Will follow up with abi's in 2 months.    2. Gastroesophageal reflux disease without esophagitis Continue PPI as already ordered, this medication has been reviewed and there are no changes at this time.  Avoidence of  caffeine and alcohol  Moderate elevation of the head of the bed   3. Essential hypertension Continue antihypertensive medications as already ordered, these medications have been reviewed and there are no changes at this time.    Current Outpatient Medications on File Prior to Visit  Medication Sig Dispense Refill  . Amino Acids-Protein Hydrolys (FEEDING SUPPLEMENT, PRO-STAT SUGAR FREE 64,) LIQD Take 30 mLs by mouth daily. To Promote Wound Healing. Wound on right lower leg    . aspirin EC 81 MG tablet Take 1 tablet (81 mg total) by mouth daily. 150 tablet 2  . atorvastatin (LIPITOR) 10 MG tablet Take 1 tablet (10 mg total) by mouth daily. 30 tablet 11  . camphor-menthol (SARNA) lotion Apply liberal amount to leg around the wound/dressing for itching BID and PRN. OK to use other places of itching. DO Not use on the wound. Okay to leave on bedside    . Cholecalciferol (VITAMIN D3) 2000 units capsule Take 2,000 Units by mouth daily.    . clopidogrel (PLAVIX) 75 MG tablet Take 1 tablet (75 mg total) by mouth daily. 30 tablet 11  . collagenase (SANTYL) ointment Apply topically daily. Cleanse right leg wound with ns, skin prep, around wound, apply santyl 1/4 in. thick to wound base, cover with ns moistened gauze, cover with allevyn dressing    . ENSURE (ENSURE) Take 237 mLs by mouth daily.    Marland Kitchen gabapentin (NEURONTIN) 100 MG  capsule Take 100 mg by mouth 3 (three) times daily as needed.    Marland Kitchen levothyroxine (SYNTHROID, LEVOTHROID) 88 MCG tablet Take 88 mcg by mouth daily before breakfast.    . magnesium hydroxide (MILK OF MAGNESIA) 400 MG/5ML suspension Take 30 mLs by mouth every 4 (four) hours as needed for mild constipation. Constipation/ no BM for 2 days    . Magnesium Oxide 250 MG TABS Take 250 mg by mouth daily.    . pantoprazole (PROTONIX) 40 MG tablet Take 1 tablet (40 mg total) by mouth daily. Switch for any other PPI at similar dose and frequency 30 tablet 3  . Probiotic Product (RISA-BID PROBIOTIC) TABS Take 1 tablet by mouth daily.     . traMADol (ULTRAM) 50 MG tablet Take 1 tablet (50 mg total) by mouth every 6 (six) hours as needed. 20 tablet 0  . traZODone (DESYREL) 50 MG tablet Take 50 mg at bedtime by mouth.    Loura Pardon Salicylate (ASPERCREME) 10 % LOTN Apply thin film topically to areas of pain 3 times a day as needed    . UNABLE TO FIND Diet: Cardiac diet, No Concentrated Sweet    . vitamin B-12 (CYANOCOBALAMIN) 500 MCG tablet Take 500 mcg by mouth daily.    Marland Kitchen acetaminophen (MAPAP ARTHRITIS PAIN) 650 MG CR tablet Take 650 mg 2 (two) times daily by mouth.     Marland Kitchen acetaminophen (TYLENOL) 325 MG tablet Take 650 mg by mouth every 4 (four) hours as needed. for pain/ increased temp. May be administered orally, per G-tube if needed or rectally if unable to swallow (separate order). Maximum dose for 24 hours is 3,000 mg from all sources of Acetaminophen/ Tylenol     No current facility-administered medications on file prior to visit.     There are no Patient Instructions on file for this visit. No follow-ups on file.   Kris Hartmann, NP

## 2018-03-26 ENCOUNTER — Non-Acute Institutional Stay (SKILLED_NURSING_FACILITY): Payer: Medicare Other | Admitting: Adult Health

## 2018-03-26 ENCOUNTER — Encounter: Payer: Self-pay | Admitting: Adult Health

## 2018-03-26 DIAGNOSIS — E538 Deficiency of other specified B group vitamins: Secondary | ICD-10-CM

## 2018-03-26 DIAGNOSIS — G4700 Insomnia, unspecified: Secondary | ICD-10-CM | POA: Diagnosis not present

## 2018-03-26 DIAGNOSIS — I2510 Atherosclerotic heart disease of native coronary artery without angina pectoris: Secondary | ICD-10-CM

## 2018-03-26 DIAGNOSIS — K219 Gastro-esophageal reflux disease without esophagitis: Secondary | ICD-10-CM | POA: Diagnosis not present

## 2018-03-26 DIAGNOSIS — E785 Hyperlipidemia, unspecified: Secondary | ICD-10-CM | POA: Diagnosis not present

## 2018-03-26 DIAGNOSIS — E034 Atrophy of thyroid (acquired): Secondary | ICD-10-CM | POA: Diagnosis not present

## 2018-03-26 NOTE — Progress Notes (Signed)
Location:  [facility] Room Number: 345-P Place of Service:  SNF (31) Provider:  Durenda Age, NP  Patient Care Team: Kirk Ruths, MD as PCP - General (Internal Medicine)  Extended Emergency Contact Information Primary Emergency Contact: [place] Phone: [phone] Relation: Niece Secondary Emergency Contact: Charolotte Eke [place] Phone: [phone] Relation: Nephew  Code Status:  DNR  Goals of care: Advanced Directive information Advanced Directives 02/20/2018  Does Patient Have a Medical Advance Directive? Yes  Type of Paramedic of [place];Living will;Out of facility DNR (pink MOST or yellow form)  Does patient want to make changes to medical advance directive? No - Patient declined  Copy of [place] in Chart? Yes  Pre-existing out of facility DNR order (yellow form or pink MOST form) Yellow form placed in chart (order not valid for inpatient use)     Chief Complaint  Patient presents with  . Medical Management of Chronic Issues    Routine [facility] visit    HPI:  Ashley [name] is a 84 y.o. female seen today for medical management of chronic diseases.  She is a long-term care resident of [facility]  She has a PMH of stroke, carotid stenosis, DM, and diverticulitis. She was seen in her room today. She said that it [birth year] today. She is so happy and thinks that her nephew is planning a birthday party for her. She had not complained of chest pains.   Past Medical History:  Diagnosis Date  . Acute gouty arthritis 11/21/2016  . Aortic atherosclerosis ([place]) 09/04/2016  . Carotid artery disease ([place]) 11/21/2016  . Carotid stenosis 12/03/2015   Advanced heavily calcified atherosclerotic disease at both carotid bifurcations. Stenoses measured at 70% affecting the right proximal ICA and 80% affecting the left proximal ICA.    Marland Kitchen Chronic kidney disease   . Coronary atherosclerosis 09/04/2016  . Diverticulitis   . DM (diabetes mellitus), type 2 ([place]) 09/04/2016  . GERD (gastroesophageal reflux disease)   . Gout   . HTN, goal below 140/90 11/21/2016  . Hyperlipidemia 06/20/2016  . Hypothyroidism, unspecified 11/21/2016  . Primary osteoarthritis 11/21/2016   involving multiple joints  . Stroke [facility])    Past Surgical History:  Procedure Laterality Date  . AMPUTATION Right 02/13/2018   Procedure: AMPUTATION RAY ( 2ND TOE );  Surgeon: [name], MD;  Location: [facility] ORS;  Service: Vascular;  Laterality: Right;  . CATARACT EXTRACTION Bilateral   . COLONOSCOPY    . Finger crystal removal     right 3rd  . LOWER EXTREMITY ANGIOGRAPHY Right 01/06/2018   Procedure: LOWER EXTREMITY ANGIOGRAPHY;  Surgeon: [name], MD;  Location: [place] [facility];  Service: Cardiovascular;  Laterality: Right;  . toe crystal removal     right 1st toe    No Known Allergies  Outpatient Encounter Medications as of 03/26/2018  Medication Sig  . acetaminophen (MAPAP ARTHRITIS PAIN) 650 MG CR tablet Take 650 mg 2 (two) times daily by mouth.   Marland Kitchen acetaminophen (TYLENOL) 325 MG tablet Take 650 mg by mouth every 4 (four) hours as needed. for pain/ increased temp. May be administered orally, per G-tube if needed or rectally if unable to swallow (separate order). Maximum dose for 24 hours is 3,000 mg from all sources of Acetaminophen/ Tylenol  . Amino Acids-Protein Hydrolys (FEEDING SUPPLEMENT, PRO-STAT SUGAR FREE 64,) LIQD Take 30 mLs by mouth  daily. To Promote Wound Healing. Wound on right lower leg  . aspirin EC 81 MG tablet Take 1 tablet (81 mg total) by mouth daily.  Marland Kitchen atorvastatin (LIPITOR) 10 MG tablet Take 1 tablet (10 mg total) by mouth daily.  . camphor-menthol (SARNA) lotion Apply liberal amount to leg around the wound/dressing for itching BID and PRN. OK to use other places of itching. DO Not use on the wound. Okay to leave on  bedside  . Cholecalciferol (VITAMIN D3) 2000 units capsule Take 2,000 Units by mouth daily.  . clopidogrel (PLAVIX) 75 MG tablet Take 1 tablet (75 mg total) by mouth daily.  . collagenase (SANTYL) ointment Apply topically daily. Cleanse right leg wound with ns, skin prep, around wound, apply santyl 1/4 in. thick to wound base, cover with ns moistened gauze, cover with allevyn dressing  . ENSURE (ENSURE) Take 237 mLs by mouth daily.  Marland Kitchen gabapentin (NEURONTIN) 100 MG capsule Take 100 mg by mouth 3 (three) times daily as needed.  Marland Kitchen levothyroxine (SYNTHROID, LEVOTHROID) 88 MCG tablet Take 88 mcg by mouth daily before breakfast.  . magnesium hydroxide (MILK OF MAGNESIA) 400 MG/5ML suspension Take 30 mLs by mouth every 4 (four) hours as needed for mild constipation. Constipation/ no BM for 2 days  . Magnesium Oxide 250 MG TABS Take 250 mg by mouth daily.  . pantoprazole (PROTONIX) 40 MG tablet Take 1 tablet (40 mg total) by mouth daily. Switch for any other PPI at similar dose and frequency  . Probiotic Product (RISA-BID PROBIOTIC) TABS Take 1 tablet by mouth daily.   . traMADol (ULTRAM) 50 MG tablet Take 1 tablet (50 mg total) by mouth every 6 (six) hours as needed.  . traZODone (DESYREL) 50 MG tablet Take 50 mg at bedtime by mouth.  . triamcinolone cream (KENALOG) 0.1 % Apply 1 application topically daily.  Loura Pardon Salicylate (ASPERCREME) 10 % LOTN Apply thin film topically to areas of pain 3 times a day as needed  . UNABLE TO FIND Diet: Cardiac diet, No Concentrated Sweet  . vitamin B-12 (CYANOCOBALAMIN) 500 MCG tablet Take 500 mcg by mouth daily.   No facility-administered encounter medications on file as of 03/26/2018.     Review of Systems  GENERAL: No change in appetite, no fatigue, no weight changes, no fever, chills or weakness MOUTH and THROAT: Denies oral discomfort, gingival pain or bleeding RESPIRATORY: no cough, SOB, DOE, wheezing, hemoptysis CARDIAC: No chest pain, edema or  palpitations GI: No abdominal pain, diarrhea, constipation, heart burn, nausea or vomiting GU: Denies dysuria, frequency, hematuria, or discharge PSYCHIATRIC: Denies feelings of depression or anxiety. No report of hallucinations, insomnia, paranoia, or agitation   Immunization History  Administered Date(s) Administered  . Influenza,inj,Quad PF,6+ Mos 01/28/2015  . Influenza-Unspecified 02/14/2016  . PPD Test 12/28/2016  . Pneumococcal Polysaccharide-23 01/21/2018  . Td 07/08/2017   Pertinent  Health Maintenance Due  Topic Date Due  . FOOT EXAM  03/27/1929  . OPHTHALMOLOGY EXAM  03/27/1929  . INFLUENZA VACCINE  12/26/2017  . DEXA SCAN  05/29/2023 (Originally 03/26/1986)  . HEMOGLOBIN A1C  07/23/2018  . URINE MICROALBUMIN  01/22/2019  . PNA vac Low Risk Adult (2 of 2 - PCV13) 01/22/2019   Fall Risk  09/04/2016  Falls in the past year? No    Vitals:   03/26/18 0833  BP: (!) 150/66  Pulse: 76  Resp: (!) 24  Temp: 98.1 F (36.7 C)  TempSrc: Oral  SpO2: 99%  Weight: 137 lb 4.8  oz (62.3 kg)  Height: 5\' 2"  (1.575 m)   Body mass index is 25.11 kg/m.  Physical Exam  GENERAL APPEARANCE: Well nourished. In no acute distress. Normal body habitus SKIN:  Skin is warm and dry. MOUTH and THROAT: Lips are without lesions. Oral mucosa is moist and without lesions. Tongue is normal in shape, size, and color and without lesions RESPIRATORY: Breathing is even & unlabored, BS CTAB CARDIAC: RRR, no murmur,no extra heart sounds, no edema GI: Abdomen soft, normal BS, no masses, no tenderness EXTREMITIES: Able to move X 4 extremities NEUROLOGICAL: There is no tremor. Speech is clear PSYCHIATRIC: Alert to self, disoriented to time and place. Affect and behavior are appropriate  Labs reviewed: Recent Labs    08/13/17 0700 08/14/17 0400 01/03/18 1225 02/06/18 1120  NA 139 140  --  134*  K 5.6* 5.3*  --  5.1  CL 110 109  --  101  CO2 22 22  --  24  GLUCOSE 110* 105*  --  169*  BUN  29* 32* 43* 33*  CREATININE 1.53* 1.51* 1.19* 1.25*  CALCIUM 8.9 8.9  --  9.3  MG 1.3*  --   --   --    Recent Labs    08/13/17 0700  AST 32  ALT 22  ALKPHOS 116  BILITOT 0.8  PROT 6.1*  ALBUMIN 3.2*   Recent Labs    08/13/17 0700 01/01/18 0610 02/06/18 1120  WBC 3.8 7.4 6.3  NEUTROABS 2.0 5.2 3.8  HGB 10.4* 10.8* 10.6*  HCT 31.1* 32.2* 30.8*  MCV 95.9 94.8 95.1  PLT 157 203 229   Lab Results  Component Value Date   TSH 2.753 01/01/2018   Lab Results  Component Value Date   HGBA1C 7.6 (H) 01/20/2018   Lab Results  Component Value Date   CHOL 112 08/13/2017   HDL 45 08/13/2017   LDLCALC 39 08/13/2017   LDLDIRECT 58.0 09/04/2016   TRIG 142 08/13/2017   CHOLHDL 2.5 08/13/2017    Assessment/Plan  1. Atherosclerosis of native coronary artery of native heart without angina pectoris - no complaints of chest pains, continue Lipitor 10 mg 1 tab daily, aspirin EC 81 mg 1 tab daily and Plavix 75 mg 1 tab daily   2. Hyperlipidemia, unspecified hyperlipidemia type -continue Lipitor 10 mg 1 tab daily Lab Results  Component Value Date   CHOL 112 08/13/2017   HDL 45 08/13/2017   LDLCALC 39 08/13/2017   LDLDIRECT 58.0 09/04/2016   TRIG 142 08/13/2017   CHOLHDL 2.5 08/13/2017     3. Gastroesophageal reflux disease without esophagitis -stable, continue pantoprazole 40 mg 1 tab daily   4. Hypothyroidism due to acquired atrophy of thyroid -continue levothyroxine 88 mcg 1 tab daily Lab Results  Component Value Date   TSH 2.753 01/01/2018    5. Vitamin B12 deficiency -continue vitamin B12 500 mcg 1 tab daily   6. Insomnia, unspecified type - verbalized getting adequate sleep at night, continue trazodone 50 mg 1 tab nightly    Family/ staff Communication: Discussed plan of care with the resident.  Labs/tests ordered:  None  Goals of care:   Long-term care.   Durenda Age, NP [facility] [phone] (Monday-Friday 8:00  a.m. - 5:00 p.m.) [phone] (after hours)

## 2018-03-28 ENCOUNTER — Encounter
Admission: RE | Admit: 2018-03-28 | Discharge: 2018-03-28 | Disposition: A | Discharge status: Home / Self Care | Payer: Medicare Other | Source: Ambulatory Visit | Attending: Internal Medicine | Admitting: Internal Medicine

## 2018-04-15 DIAGNOSIS — H401132 Primary open-angle glaucoma, bilateral, moderate stage: Secondary | ICD-10-CM | POA: Diagnosis not present

## 2018-04-22 ENCOUNTER — Non-Acute Institutional Stay (SKILLED_NURSING_FACILITY): Payer: Medicare Other | Admitting: Adult Health

## 2018-04-22 ENCOUNTER — Encounter: Payer: Self-pay | Admitting: Adult Health

## 2018-04-22 DIAGNOSIS — K219 Gastro-esophageal reflux disease without esophagitis: Secondary | ICD-10-CM | POA: Diagnosis not present

## 2018-04-22 DIAGNOSIS — I2510 Atherosclerotic heart disease of native coronary artery without angina pectoris: Secondary | ICD-10-CM

## 2018-04-22 DIAGNOSIS — E785 Hyperlipidemia, unspecified: Secondary | ICD-10-CM

## 2018-04-22 NOTE — Progress Notes (Signed)
Location:   [facility] Room Number: [place] of Service:  SNF (31)   CODE STATUS: DNR  No Known Allergies  Chief Complaint  Patient presents with  . Acute Visit    Care Plan Meeting    HPI:  We have come together for her routine care plan meeting. Her right second toe amputation site is scabbed over. We have discussed her medications her lipitor is more than likely not providing her any benefit. Her family reports that she has never complained of heart burn; will stop her protonix and will begin pepcid. She is due to go to the dermatologist for the moles on her face.    Past Medical History:  Diagnosis Date  . Acute gouty arthritis 11/21/2016  . Aortic atherosclerosis ([place]) 09/04/2016  . Carotid artery disease ([place]) 11/21/2016  . Carotid stenosis 12/03/2015   Advanced heavily calcified atherosclerotic disease at both carotid bifurcations. Stenoses measured at 70% affecting the right proximal ICA and 80% affecting the left proximal ICA.  Marland Kitchen Chronic kidney disease   . Coronary atherosclerosis 09/04/2016  . Diverticulitis   . DM (diabetes mellitus), type 2 ([place]) 09/04/2016  . GERD (gastroesophageal reflux disease)   . Gout   . HTN, goal below 140/90 11/21/2016  . Hyperlipidemia 06/20/2016  . Hypothyroidism, unspecified 11/21/2016  . Primary osteoarthritis 11/21/2016   involving multiple joints  . Stroke [facility])     Past Surgical History:  Procedure Laterality Date  . AMPUTATION Right 02/13/2018   Procedure: AMPUTATION RAY ( 2ND TOE );  Surgeon: [name], MD;  Location: [facility] ORS;  Service: Vascular;  Laterality: Right;  . CATARACT EXTRACTION Bilateral   . COLONOSCOPY    . Finger crystal removal     right 3rd  . LOWER EXTREMITY ANGIOGRAPHY Right 01/06/2018   Procedure: LOWER EXTREMITY ANGIOGRAPHY;  Surgeon: [name], MD;  Location: [place] [facility];  Service: Cardiovascular;  Laterality: Right;  . toe crystal removal     right 1st toe     Social History   Socioeconomic History  . Marital status: Widowed    Spouse name: Not on file  . Number of children: 0  . Years of education: 10  . Highest education level: 10th grade  Occupational History  . Not on file  Social Needs  . Financial resource strain: Not on file  . Food insecurity:    Worry: Not on file    Inability: Not on file  . Transportation needs:    Medical: Not on file    Non-medical: Not on file  Tobacco Use  . Smoking status: Never Smoker  . Smokeless tobacco: Never Used  Substance and Sexual Activity  . Alcohol use: No  . Drug use: No  . Sexual activity: Not Currently  Lifestyle  . Physical activity:    Days per week: Not on file    Minutes per session: Not on file  . Stress: Not on file  Relationships  . Social connections:    Talks on phone: Not on file    Gets together: Not on file    Attends religious service: Not on file    Active member of club or organization: Not on file    Attends meetings of clubs or organizations: Not on file    Relationship status: Not on file  . Intimate partner violence:    Fear of current or ex partner: Not on file    Emotionally abused: Not on  file    Physically abused: Not on file    Forced sexual activity: Not on file  Other Topics Concern  . Not on file  Social History Narrative   Admitted to [place] 11/16/2016   Widowed   No children   Does not drink alcohol   Never smoker   DNR   Family History  Problem Relation Age of Onset  . Hypertension Sister   . Arthritis Sister   . Heart disease Sister   . Alcohol abuse Brother   . Stroke Brother   . Hypertension Brother   . Alcohol abuse Brother   . Stroke Brother   . Hypertension Brother   . Hypertension Brother   . Hypertension Sister       VITAL SIGNS BP (!) 141/59   Pulse 81   Temp 97.9 F (36.6 C)   Resp 20   Ht 5\' 2"  (1.575 m)   Wt 140 lb 11.2 oz (63.8 kg)   SpO2 97%   BMI 25.73 kg/m   Outpatient Encounter  Medications as of 04/22/2018  Medication Sig  . acetaminophen (MAPAP ARTHRITIS PAIN) 650 MG CR tablet Take 650 mg 2 (two) times daily by mouth.   Marland Kitchen acetaminophen (TYLENOL) 325 MG tablet Take 650 mg by mouth every 4 (four) hours as needed. for pain/ increased temp. May be administered orally, per G-tube if needed or rectally if unable to swallow (separate order). Maximum dose for 24 hours is 3,000 mg from all sources of Acetaminophen/ Tylenol  . Amino Acids-Protein Hydrolys (FEEDING SUPPLEMENT, PRO-STAT SUGAR FREE 64,) LIQD Take 30 mLs by mouth daily. To Promote Wound Healing. Wound on right lower leg  . aspirin EC 81 MG tablet Take 1 tablet (81 mg total) by mouth daily.  Marland Kitchen atorvastatin (LIPITOR) 10 MG tablet Take 1 tablet (10 mg total) by mouth daily.  . Cholecalciferol (VITAMIN D3) 2000 units capsule Take 2,000 Units by mouth daily.  . clopidogrel (PLAVIX) 75 MG tablet Take 1 tablet (75 mg total) by mouth daily.  Marland Kitchen ENSURE (ENSURE) Take 237 mLs by mouth daily.  Marland Kitchen gabapentin (NEURONTIN) 100 MG capsule Take 100 mg by mouth 3 (three) times daily as needed.  . latanoprost (XALATAN) 0.005 % ophthalmic solution Place 1 drop into both eyes at bedtime.  Marland Kitchen levothyroxine (SYNTHROID, LEVOTHROID) 88 MCG tablet Take 88 mcg by mouth daily before breakfast.  . magnesium hydroxide (MILK OF MAGNESIA) 400 MG/5ML suspension Take 30 mLs by mouth every 4 (four) hours as needed for mild constipation. Constipation/ no BM for 2 days  . Magnesium Oxide 250 MG TABS Take 250 mg by mouth daily.  . pantoprazole (PROTONIX) 40 MG tablet Take 1 tablet (40 mg total) by mouth daily. Switch for any other PPI at similar dose and frequency  . Probiotic Product (RISA-BID PROBIOTIC) TABS Take 1 tablet by mouth daily.   . traMADol (ULTRAM) 50 MG tablet Take 1 tablet (50 mg total) by mouth every 6 (six) hours as needed.  . traZODone (DESYREL) 50 MG tablet Take 50 mg at bedtime by mouth.  Loura Pardon Salicylate (ASPERCREME) 10 % LOTN  Apply thin film topically to areas of pain 3 times a day as needed  . UNABLE TO FIND Diet: Cardiac diet, No Concentrated Sweet  . vitamin B-12 (CYANOCOBALAMIN) 500 MCG tablet Take 500 mcg by mouth daily.  . camphor-menthol (SARNA) lotion Apply liberal amount to leg around the wound/dressing for itching BID and PRN. OK to use other places of  itching. DO Not use on the wound. Okay to leave on bedside  . [DISCONTINUED] collagenase (SANTYL) ointment Apply topically daily. Cleanse right leg wound with ns, skin prep, around wound, apply santyl 1/4 in. thick to wound base, cover with ns moistened gauze, cover with allevyn dressing   No facility-administered encounter medications on file as of 04/22/2018.      SIGNIFICANT DIAGNOSTIC EXAMS  LABS REVIEWED: PREVIOUS   08-13-17: wbc 3.8; hgb 10.4; hgb 31.1; mcv 95.9; plt 157; glucose 110 bun 29; creat 1.53; k+ 5.6; na++ 139; ca 8.9; liver normal albumin 3.2 chol 112; ldl 39; trig 142; hdl 45 mag 1.3; tsh 6.302; uric acid: 9.0; vit D 37.3; vit B 12: 1919 08-14-17: glucose 105; bun 32; creat 1.51; k+ 5.3; na ++ 140; ca 8.9;  09-24-17 tsh 0.747  01-01-18: wbc 7.4; hgb 10.8; hct 32.2; mcv 94.8; plt 203 tsh 2.753  TODAY:   02-06-18: wbc 6.3; hgb 10.6; hct 30.8; mcv 95.1; plt 229; glucose 169; bun 33; creat 1.25; k+ 5.1; na++ 134; ca 9.3     Review of Systems  Constitutional: Negative for malaise/fatigue.  Respiratory: Negative for cough and shortness of breath.   Cardiovascular: Negative for chest pain, palpitations and leg swelling.  Gastrointestinal: Negative for abdominal pain, constipation and heartburn.  Musculoskeletal: Negative for back pain, joint pain and myalgias.  Skin: Negative.   Neurological: Negative for dizziness.  Psychiatric/Behavioral: The patient is not nervous/anxious.      Physical Exam  Constitutional: She appears well-developed and well-nourished. No distress.  Neck: No thyromegaly present.  Cardiovascular: Normal rate,  regular rhythm and normal heart sounds.  Left pedal pulse: questionable presence Right pedal pulse very faint.    Pulmonary/Chest: Effort normal and breath sounds normal. No respiratory distress.  Abdominal: Soft. Bowel sounds are normal. She exhibits no distension. There is no tenderness.  Musculoskeletal: She exhibits no edema.  Is able to move all extremities Is status post right 2nd ray amputation   Lymphadenopathy:    She has no cervical adenopathy.  Neurological: She is alert.  Skin: Skin is warm and dry. She is not diaphoretic.  Psychiatric: She has a normal mood and affect.     ASSESSMENT/ PLAN:  TODAY:   1. Atherosclerotic heart disease of native coronary artery without angina pectoris 2. Gastroesophageal reflux disease without esophagitis 3. Hyperlipidemia  Will stop her lipitor; probiotic and protonix Will begin her pepcid 20 mg daily  Will continue her current plan of care.     MD is aware of resident's narcotic use and is in agreement with current plan of care. We will attempt to wean resident as apropriate   Ok Edwards NP [facility]  Contact [phone] Monday through Friday 8am- 5pm  After hours call [phone]

## 2018-04-23 DIAGNOSIS — L821 Other seborrheic keratosis: Secondary | ICD-10-CM | POA: Diagnosis not present

## 2018-04-25 ENCOUNTER — Encounter: Payer: Self-pay | Admitting: Adult Health

## 2018-04-25 ENCOUNTER — Non-Acute Institutional Stay (SKILLED_NURSING_FACILITY): Payer: Medicare Other | Admitting: Adult Health

## 2018-04-25 DIAGNOSIS — N183 Chronic kidney disease, stage 3 unspecified: Secondary | ICD-10-CM

## 2018-04-25 DIAGNOSIS — M150 Primary generalized (osteo)arthritis: Secondary | ICD-10-CM | POA: Diagnosis not present

## 2018-04-25 DIAGNOSIS — M159 Polyosteoarthritis, unspecified: Secondary | ICD-10-CM

## 2018-04-25 DIAGNOSIS — E1122 Type 2 diabetes mellitus with diabetic chronic kidney disease: Secondary | ICD-10-CM | POA: Diagnosis not present

## 2018-04-25 DIAGNOSIS — M1A39X0 Chronic gout due to renal impairment, multiple sites, without tophus (tophi): Secondary | ICD-10-CM

## 2018-04-25 NOTE — Progress Notes (Signed)
Location:   [place] Room Number: 161 [place] of Service:  SNF (31)   CODE STATUS: dnr  No Known Allergies  Chief Complaint  Patient presents with  . Medical Management of Chronic Issues    Type 2 diabetes mellitus with stage 3 chronic kidney disease without long term current use of insulin; CKD stage 3 due to type diabetes mellitus; primary osteoarthritis involving multiple joints; chronic gout due to renal impairment of multiple sites without tophus     HPI:  She is a 84 year old long term resident of this facility being seen for the management of her chronic illnesses: diabetes; ckd stage 3; osteoarthritis; gout. She denies any uncontrolled joint pain; no changes in appetite; no anxiety or insomnia.    Past Medical History:  Diagnosis Date  . Acute gouty arthritis 11/21/2016  . Aortic atherosclerosis ([place]) 09/04/2016  . Carotid artery disease ([place]) 11/21/2016  . Carotid stenosis 12/03/2015   Advanced heavily calcified atherosclerotic disease at both carotid bifurcations. Stenoses measured at 70% affecting the right proximal ICA and 80% affecting the left proximal ICA.  Marland Kitchen Chronic kidney disease   . Coronary atherosclerosis 09/04/2016  . Diverticulitis   . DM (diabetes mellitus), type 2 ([place]) 09/04/2016  . GERD (gastroesophageal reflux disease)   . Gout   . HTN, goal below 140/90 11/21/2016  . Hyperlipidemia 06/20/2016  . Hypothyroidism, unspecified 11/21/2016  . Primary osteoarthritis 11/21/2016   involving multiple joints  . Stroke [facility])     Past Surgical History:  Procedure Laterality Date  . AMPUTATION Right 02/13/2018   Procedure: AMPUTATION RAY ( 2ND TOE );  Surgeon: [name], MD;  Location: [facility] ORS;  Service: Vascular;  Laterality: Right;  . CATARACT EXTRACTION Bilateral   . COLONOSCOPY    . Finger crystal removal     right 3rd  . LOWER EXTREMITY ANGIOGRAPHY Right 01/06/2018   Procedure: LOWER EXTREMITY ANGIOGRAPHY;  Surgeon: [name], MD;   Location: [place] [facility];  Service: Cardiovascular;  Laterality: Right;  . toe crystal removal     right 1st toe    Social History   Socioeconomic History  . Marital status: Widowed    Spouse name: Not on file  . Number of children: 0  . Years of education: 10  . Highest education level: 10th grade  Occupational History  . Not on file  Social Needs  . Financial resource strain: Not on file  . Food insecurity:    Worry: Not on file    Inability: Not on file  . Transportation needs:    Medical: Not on file    Non-medical: Not on file  Tobacco Use  . Smoking status: Never Smoker  . Smokeless tobacco: Never Used  Substance and Sexual Activity  . Alcohol use: No  . Drug use: No  . Sexual activity: Not Currently  Lifestyle  . Physical activity:    Days per week: Not on file    Minutes per session: Not on file  . Stress: Not on file  Relationships  . Social connections:    Talks on phone: Not on file    Gets together: Not on file    Attends religious service: Not on file    Active member of club or organization: Not on file    Attends meetings of clubs or organizations: Not on file    Relationship status: Not on file  . Intimate partner violence:    Fear of current  or ex partner: Not on file    Emotionally abused: Not on file    Physically abused: Not on file    Forced sexual activity: Not on file  Other Topics Concern  . Not on file  Social History Narrative   Admitted to [place] 11/16/2016   Widowed   No children   Does not drink alcohol   Never smoker   DNR   Family History  Problem Relation Age of Onset  . Hypertension Sister   . Arthritis Sister   . Heart disease Sister   . Alcohol abuse Brother   . Stroke Brother   . Hypertension Brother   . Alcohol abuse Brother   . Stroke Brother   . Hypertension Brother   . Hypertension Brother   . Hypertension Sister       VITAL SIGNS BP (!) 141/59   Pulse 81   Temp 97.9 F (36.6 C)    Resp 18   Ht 5\' 2"  (1.575 m)   Wt 140 lb (63.5 kg)   SpO2 97%   BMI 25.61 kg/m   Outpatient Encounter Medications as of 04/25/2018  Medication Sig  . acetaminophen (MAPAP ARTHRITIS PAIN) 650 MG CR tablet Take 650 mg 2 (two) times daily by mouth.   Marland Kitchen acetaminophen (TYLENOL) 325 MG tablet Take 650 mg by mouth every 4 (four) hours as needed. for pain/ increased temp. May be administered orally, per G-tube if needed or rectally if unable to swallow (separate order). Maximum dose for 24 hours is 3,000 mg from all sources of Acetaminophen/ Tylenol  . Amino Acids-Protein Hydrolys (FEEDING SUPPLEMENT, PRO-STAT SUGAR FREE 64,) LIQD Take 30 mLs by mouth daily. To Promote Wound Healing. Wound on right lower leg  . aspirin EC 81 MG tablet Take 1 tablet (81 mg total) by mouth daily.  . camphor-menthol (SARNA) lotion Apply liberal amount to leg around the wound/dressing for itching BID and PRN. OK to use other places of itching. DO Not use on the wound. Okay to leave on bedside  . Cholecalciferol (VITAMIN D3) 2000 units capsule Take 2,000 Units by mouth daily.  . clopidogrel (PLAVIX) 75 MG tablet Take 1 tablet (75 mg total) by mouth daily.  Marland Kitchen ENSURE (ENSURE) Take 237 mLs by mouth daily.  . famotidine (PEPCID) 20 MG tablet Take 20 mg by mouth at bedtime.  . gabapentin (NEURONTIN) 100 MG capsule Take 100 mg by mouth 3 (three) times daily as needed.  . latanoprost (XALATAN) 0.005 % ophthalmic solution Place 1 drop into both eyes at bedtime.  Marland Kitchen levothyroxine (SYNTHROID, LEVOTHROID) 88 MCG tablet Take 88 mcg by mouth daily before breakfast.  . magnesium hydroxide (MILK OF MAGNESIA) 400 MG/5ML suspension Take 30 mLs by mouth every 4 (four) hours as needed for mild constipation. Constipation/ no BM for 2 days  . Magnesium Oxide 250 MG TABS Take 250 mg by mouth daily.  . traMADol (ULTRAM) 50 MG tablet Take 1 tablet (50 mg total) by mouth every 6 (six) hours as needed.  . traZODone (DESYREL) 50 MG tablet Take 50  mg at bedtime by mouth.  Loura Pardon Salicylate (ASPERCREME) 10 % LOTN Apply thin film topically to areas of pain 3 times a day as needed  . UNABLE TO FIND Diet: regular  . vitamin B-12 (CYANOCOBALAMIN) 500 MCG tablet Take 500 mcg by mouth daily.  . [DISCONTINUED] pantoprazole (PROTONIX) 40 MG tablet Take 1 tablet (40 mg total) by mouth daily. Switch for any other PPI at  similar dose and frequency  . [DISCONTINUED] atorvastatin (LIPITOR) 10 MG tablet Take 1 tablet (10 mg total) by mouth daily.  . [DISCONTINUED] Probiotic Product (RISA-BID PROBIOTIC) TABS Take 1 tablet by mouth daily.    No facility-administered encounter medications on file as of 04/25/2018.      SIGNIFICANT DIAGNOSTIC EXAMS   LABS REVIEWED: PREVIOUS   08-13-17: wbc 3.8; hgb 10.4; hgb 31.1; mcv 95.9; plt 157; glucose 110 bun 29; creat 1.53; k+ 5.6; na++ 139; ca 8.9; liver normal albumin 3.2 chol 112; ldl 39; trig 142; hdl 45 mag 1.3; tsh 6.302; uric acid: 9.0; vit D 37.3; vit B 12: 1919 08-14-17: glucose 105; bun 32; creat 1.51; k+ 5.3; na ++ 140; ca 8.9;  09-24-17 tsh 0.747  01-01-18: wbc 7.4; hgb 10.8; hct 32.2; mcv 94.8; plt 203 tsh 2.753  TODAY:   01-20-18: hgb a1c 7.6 urine microalbumin 46.6  02-06-18: wbc 6.3; hgb 10.6; hct 30.8; mcv 95.1; plt 229; glucose 169; bun 33; creat 1.25; k+ 5.1; na++ 134; ca 9.3    Review of Systems  Constitutional: Negative for malaise/fatigue.  Respiratory: Negative for cough and shortness of breath.   Cardiovascular: Negative for chest pain, palpitations and leg swelling.  Gastrointestinal: Negative for abdominal pain, constipation and heartburn.  Musculoskeletal: Negative for back pain, joint pain and myalgias.  Skin: Negative.   Neurological: Negative for dizziness.  Psychiatric/Behavioral: The patient is not nervous/anxious.     Physical Exam  Constitutional: She appears well-developed and well-nourished. No distress.  Neck: No thyromegaly present.  Cardiovascular: Normal  rate, regular rhythm and normal heart sounds.  Left pedal pulse: questionable presence Right pedal pulse very faint.     Pulmonary/Chest: Effort normal and breath sounds normal. No respiratory distress.  Abdominal: Soft. Bowel sounds are normal. She exhibits no distension. There is no tenderness.  Musculoskeletal: She exhibits no edema.  Is able to move all extremities Is status post right 2nd ray amputation   Lymphadenopathy:    She has no cervical adenopathy.  Neurological: She is alert.  Skin: Skin is warm and dry. She is not diaphoretic.  Right 2nd toe amputation is scabbed Left posterior knee 1.5 x 1.5 cm   Psychiatric: She has a normal mood and affect.      ASSESSMENT/ PLAN:  TODAY:   1. Type 2 diabetes mellitus with stage 3 chronic kidney disease without long term use of insulin: is stable hgb a1c 7.6  will not make changes will monitor  2. CKD stage 3 due to type 2 diabetes mellitus: stable bun 33 creat 1.25  3. Primary osteo arthritis of multiple sites: is stable will continue tylenol 650 mg twice daily   4. Chronic gout due to renal impairment unspecified site without tophus: is stable will monitor   PREVIOUS   5. Insomnia is stable will continue trazodone 50 mg nightly   6. Dyslipidemia associated with type 2 diabetes mellitus: is stable LDL 39 her lipitor has been stopped due to her advanced age and LDL Of 39.   7. Peripheral vascular disease of lower extremity with ulceration: is status post right 2nd toe ray amputation is stable; will continue asa 81 mg daily plavix 75 mg daily is taking tylenol 650 mg twice daily will stop gabapentin due to non use   8. Atherosclerotic heart disease of native coronary artery without angina pectoris: is stable will continue asa 81 mg daily plavix 75 mg daily  will monitor   9. Gastroesophageal reflux disease without esophagitis:  is stable will continue pepcid 20 mg daily protonix has been stopped   10. Hypothyroidism due to  acquired atrophy of thyroid: is stable tsh 2.753: will continue synthroid 88 mcg daily     MD is aware of resident's narcotic use and is in agreement with current plan of care. We will attempt to wean resident as apropriate   Ok Edwards NP [facility]  Contact [phone] Monday through Friday 8am- 5pm  After hours call [phone]

## 2018-04-29 ENCOUNTER — Ambulatory Visit (INDEPENDENT_AMBULATORY_CARE_PROVIDER_SITE_OTHER): Payer: Medicare Other | Admitting: Nurse Practitioner

## 2018-04-29 ENCOUNTER — Encounter (INDEPENDENT_AMBULATORY_CARE_PROVIDER_SITE_OTHER): Payer: Self-pay | Admitting: Nurse Practitioner

## 2018-04-29 ENCOUNTER — Ambulatory Visit (INDEPENDENT_AMBULATORY_CARE_PROVIDER_SITE_OTHER): Payer: Medicare Other

## 2018-04-29 VITALS — BP 189/76 | HR 84 | Resp 19 | Ht 62.0 in | Wt 140.0 lb

## 2018-04-29 DIAGNOSIS — N183 Chronic kidney disease, stage 3 (moderate): Secondary | ICD-10-CM

## 2018-04-29 DIAGNOSIS — L97909 Non-pressure chronic ulcer of unspecified part of unspecified lower leg with unspecified severity: Secondary | ICD-10-CM | POA: Diagnosis not present

## 2018-04-29 DIAGNOSIS — I739 Peripheral vascular disease, unspecified: Secondary | ICD-10-CM

## 2018-04-29 DIAGNOSIS — E1122 Type 2 diabetes mellitus with diabetic chronic kidney disease: Secondary | ICD-10-CM

## 2018-04-29 DIAGNOSIS — E785 Hyperlipidemia, unspecified: Secondary | ICD-10-CM

## 2018-04-29 DIAGNOSIS — I10 Essential (primary) hypertension: Secondary | ICD-10-CM

## 2018-04-29 NOTE — Progress Notes (Signed)
Subjective:    Patient ID: Ashley [name], female    DOB: December 26, 1918, 84 y.o.   MRN: [id number] Chief Complaint  Patient presents with  . Follow-up    2-3 month ABI follow up    HPI  Ashley [name] is a 84 y.o. female that is following up today for follow-up after an angioplasty done on 01/06/2018, with subsequent toe amputation on 02/13/2018.  The patient had a wound to her right lateral ankle area and it is completely healed.  Today she has no evidence of wounds on her right lower extremity.  No evidence of wounds on her left lower extremity.  The patient denies any claudication or rest pain like symptoms.  The patient is present today with her nephew, who also echoes that she has not had any complaints of pain or issues with lower extremity wounds since her last visit.  Patient denies any recent fever, chills, nausea, vomiting or diarrhea.  The patient underwent bilateral ABIs today which revealed noncompressible ABIs of her left lower extremity, which was consistent with her previous study done on 01/17/2018.  Her TBI today is 0.53 on her left lower extremity, whereas it was previous 0.55.  Her ABI on her right is 1.24, previously was noncompressible.  Her TBI is 0.34 whereas previously it was 0.25.  The bilateral tibial waveforms were monophasic, however she has strong left toe waveforms with dampened right digit waveforms.  However waveforms on her right digit were flat prior to intervention.  Past Medical History:  Diagnosis Date  . Acute gouty arthritis 11/21/2016  . Aortic atherosclerosis ([place]) 09/04/2016  . Carotid artery disease ([place]) 11/21/2016  . Carotid stenosis 12/03/2015   Advanced heavily calcified atherosclerotic disease at both carotid bifurcations. Stenoses measured at 70% affecting the right proximal ICA and 80% affecting the left proximal ICA.  Marland Kitchen Chronic kidney disease   . Coronary atherosclerosis 09/04/2016  . Diverticulitis   . DM (diabetes mellitus), type 2 ([place]) 09/04/2016   . GERD (gastroesophageal reflux disease)   . Gout   . HTN, goal below 140/90 11/21/2016  . Hyperlipidemia 06/20/2016  . Hypothyroidism, unspecified 11/21/2016  . Primary osteoarthritis 11/21/2016   involving multiple joints  . Stroke [facility])     Past Surgical History:  Procedure Laterality Date  . AMPUTATION Right 02/13/2018   Procedure: AMPUTATION RAY ( 2ND TOE );  Surgeon: [name], MD;  Location: [facility] ORS;  Service: Vascular;  Laterality: Right;  . CATARACT EXTRACTION Bilateral   . COLONOSCOPY    . Finger crystal removal     right 3rd  . LOWER EXTREMITY ANGIOGRAPHY Right 01/06/2018   Procedure: LOWER EXTREMITY ANGIOGRAPHY;  Surgeon: [name], MD;  Location: [place] [facility];  Service: Cardiovascular;  Laterality: Right;  . toe crystal removal     right 1st toe    Social History   Socioeconomic History  . Marital status: Widowed    Spouse name: Not on file  . Number of children: 0  . Years of education: 10  . Highest education level: 10th grade  Occupational History  . Not on file  Social Needs  . Financial resource strain: Not on file  . Food insecurity:    Worry: Not on file    Inability: Not on file  . Transportation needs:    Medical: Not on file    Non-medical: Not on file  Tobacco Use  . Smoking status: Never Smoker  . Smokeless tobacco: Never Used  Substance and Sexual Activity  . Alcohol use: No  . Drug use: No  . Sexual activity: Not Currently  Lifestyle  . Physical activity:    Days per week: Not on file    Minutes per session: Not on file  . Stress: Not on file  Relationships  . Social connections:    Talks on phone: Not on file    Gets together: Not on file    Attends religious service: Not on file    Active member of club or organization: Not on file    Attends meetings of clubs or organizations: Not on file    Relationship status: Not on file  . Intimate partner violence:    Fear of current or ex partner: Not on file     Emotionally abused: Not on file    Physically abused: Not on file    Forced sexual activity: Not on file  Other Topics Concern  . Not on file  Social History Narrative   Admitted to [facility] 11/16/2016   Widowed   No children   Does not drink alcohol   Never smoker   DNR    Family History  Problem Relation Age of Onset  . Hypertension Sister   . Arthritis Sister   . Heart disease Sister   . Alcohol abuse Brother   . Stroke Brother   . Hypertension Brother   . Alcohol abuse Brother   . Stroke Brother   . Hypertension Brother   . Hypertension Brother   . Hypertension Sister     No Known Allergies   Review of Systems   Review of Systems: Negative Unless Checked Constitutional: [] Weight loss  [] Fever  [] Chills Cardiac: [] Chest pain   []  Atrial Fibrillation  [] Palpitations   [] Shortness of breath when laying flat   [] Shortness of breath with exertion. Vascular:  [] Pain in legs with walking   [] Pain in legs with standing  [] History of DVT   [] Phlebitis   [] Swelling in legs   [] Varicose veins   [] Non-healing ulcers Pulmonary:   [] Uses home oxygen   [] Productive cough   [] Hemoptysis   [] Wheeze  [] COPD   [] Asthma Neurologic:  [] Dizziness   [] Seizures   [x] History of stroke   [] History of TIA  [] Aphasia   [] Vissual changes   [] Weakness or numbness in arm   [x] Weakness or numbness in leg Musculoskeletal:   [x] Joint swelling   [x] Joint pain   [] Low back pain  []  History of Knee Replacement Hematologic:  [] Easy bruising  [] Easy bleeding   [] Hypercoagulable state   [] Anemic Gastrointestinal:  [] Diarrhea   [] Vomiting  [x] Gastroesophageal reflux/heartburn   [] Difficulty swallowing. Genitourinary:  [x] Chronic kidney disease   [] Difficult urination  [] Anuric   [] Blood in urine Skin:  [] Rashes   [] Ulcers  Psychological:  [] History of anxiety   []  History of major depression  []  Memory Difficulties     Objective:   Physical Exam  BP (!) 189/76 (BP Location: Right Arm,  Patient Position: Sitting)   Pulse 84   Resp 19   Ht 5\' 2"  (1.575 m)   Wt 140 lb (63.5 kg)   BMI 25.61 kg/m   Gen: WD/WN, NAD Head: [place]/AT, No temporalis wasting.   Ear/Nose/Throat: Hearing grossly intact, nares w/o erythema or drainageExtremely hard of hearing Eyes: PER, EOMI, sclera nonicteric.  Neck: Supple, no masses.  No JVD.  Pulmonary:  Good air movement, no use of accessory muscles.  Cardiac: RRR Vascular:  Vessel Right Left  Radial Palpable Palpable  Dorsalis Pedis  not palpable Palpable  Posterior Tibial  not palpable Palpable   Gastrointestinal: soft, non-distended. No guarding/no peritoneal signs.  Musculoskeletal: Uses wheelchair for transportation Neurologic: Pain and light touch intact in extremities.  Symmetrical.  Speech is fluent. Motor exam as listed above. Psychiatric: Judgment intact, Mood & affect appropriate for pt's clinical situation. Dermatologic: No Venous rashes. No Ulcers Noted.  No changes consistent with cellulitis. Lymph : No Cervical lymphadenopathy, no lichenification or skin changes of chronic lymphedema.      Assessment & Plan:   1. Peripheral vascular disease of lower extremity with ulceration (HCC) The patient underwent bilateral ABIs today which revealed noncompressible ABIs of her left lower extremity, which was consistent with her previous study done on 01/17/2018.  Her TBI today is 0.53 on her left lower extremity, whereas it was previous 0.55.  Her ABI on her right is 1.24, previously was noncompressible.  Her TBI is 0.34 whereas previously it was 0.25.  The bilateral tibial waveforms were monophasic, however she has strong left toe waveforms with dampened right digit waveforms.  However waveforms on her right digit were flat prior to intervention.    Ashley [name] has some evidence of peripheral vascular disease worsening, however her wounds are completely healed.  Her amputation site has healed nicely.  Her ABIs on her right lower extremity  are reduced as well as with monophasic waveforms.  However her toe waveforms are much stronger than prior to her intervention.  Given her advanced age we will continue to monitor with as conservative approach as possible.  We will have the patient follow-up in 6 months with bilateral ABIs as well as a right lower extremity duplex.  - VAS [place] ABI WITH/WO TBI; Future - VAS [place] LOWER EXTREMITY ARTERIAL DUPLEX; Future  2. Essential hypertension Continue antihypertensive medications as already ordered, these medications have been reviewed and there are no changes at this time.   3. Type 2 diabetes mellitus with stage 3 chronic kidney disease, without long-term current use of insulin (HCC) Continue hypoglycemic medications as already ordered, these medications have been reviewed and there are no changes at this time.  Hgb A1C to be monitored as already arranged by primary service   4. Hyperlipidemia, unspecified hyperlipidemia type Continue statin as ordered and reviewed, no changes at this time     Current Outpatient Medications on File Prior to Visit  Medication Sig Dispense Refill  . acetaminophen (MAPAP ARTHRITIS PAIN) 650 MG CR tablet Take 650 mg 2 (two) times daily by mouth.     Marland Kitchen acetaminophen (TYLENOL) 325 MG tablet Take 650 mg by mouth every 4 (four) hours as needed. for pain/ increased temp. May be administered orally, per G-tube if needed or rectally if unable to swallow (separate order). Maximum dose for 24 hours is 3,000 mg from all sources of Acetaminophen/ Tylenol    . Amino Acids-Protein Hydrolys (FEEDING SUPPLEMENT, PRO-STAT SUGAR FREE 64,) LIQD Take 30 mLs by mouth daily. To Promote Wound Healing. Wound on right lower leg    . aspirin EC 81 MG tablet Take 1 tablet (81 mg total) by mouth daily. 150 tablet 2  . camphor-menthol (SARNA) lotion Apply liberal amount to leg around the wound/dressing for itching BID and PRN. OK to use other places of itching. DO Not use on the wound.  Okay to leave on bedside    . Cholecalciferol (VITAMIN D3) 2000 units capsule Take 2,000 Units by mouth daily.    Marland Kitchen  clopidogrel (PLAVIX) 75 MG tablet Take 1 tablet (75 mg total) by mouth daily. 30 tablet 11  . ENSURE (ENSURE) Take 237 mLs by mouth daily.    . famotidine (PEPCID) 20 MG tablet Take 20 mg by mouth at bedtime.    . gabapentin (NEURONTIN) 100 MG capsule Take 100 mg by mouth 3 (three) times daily as needed.    . latanoprost (XALATAN) 0.005 % ophthalmic solution Place 1 drop into both eyes at bedtime.    Marland Kitchen levothyroxine (SYNTHROID, LEVOTHROID) 88 MCG tablet Take 88 mcg by mouth daily before breakfast.    . magnesium hydroxide (MILK OF MAGNESIA) 400 MG/5ML suspension Take 30 mLs by mouth every 4 (four) hours as needed for mild constipation. Constipation/ no BM for 2 days    . Magnesium Oxide 250 MG TABS Take 250 mg by mouth daily.    . traMADol (ULTRAM) 50 MG tablet Take 1 tablet (50 mg total) by mouth every 6 (six) hours as needed. 20 tablet 0  . traZODone (DESYREL) 50 MG tablet Take 50 mg at bedtime by mouth.    Loura Pardon Salicylate (ASPERCREME) 10 % LOTN Apply thin film topically to areas of pain 3 times a day as needed    . UNABLE TO FIND Diet: regular    . vitamin B-12 (CYANOCOBALAMIN) 500 MCG tablet Take 500 mcg by mouth daily.     No current facility-administered medications on file prior to visit.     There are no Patient Instructions on file for this visit. No follow-ups on file.   Kris Hartmann, NP  This note was completed with Sales executive.  Any errors are purely unintentional.

## 2018-05-27 ENCOUNTER — Non-Acute Institutional Stay (SKILLED_NURSING_FACILITY): Payer: Medicare Other | Admitting: Adult Health

## 2018-05-27 ENCOUNTER — Encounter: Payer: Self-pay | Admitting: Adult Health

## 2018-05-27 DIAGNOSIS — E785 Hyperlipidemia, unspecified: Secondary | ICD-10-CM | POA: Diagnosis not present

## 2018-05-27 DIAGNOSIS — I739 Peripheral vascular disease, unspecified: Secondary | ICD-10-CM | POA: Diagnosis not present

## 2018-05-27 DIAGNOSIS — E1169 Type 2 diabetes mellitus with other specified complication: Secondary | ICD-10-CM | POA: Diagnosis not present

## 2018-05-27 DIAGNOSIS — L97909 Non-pressure chronic ulcer of unspecified part of unspecified lower leg with unspecified severity: Secondary | ICD-10-CM

## 2018-05-27 DIAGNOSIS — G4700 Insomnia, unspecified: Secondary | ICD-10-CM | POA: Diagnosis not present

## 2018-05-27 DIAGNOSIS — I2510 Atherosclerotic heart disease of native coronary artery without angina pectoris: Secondary | ICD-10-CM

## 2018-05-27 NOTE — Progress Notes (Signed)
Location:   [facility] Room Number: [place] of Service:  SNF (31)   CODE STATUS: DNR  No Known Allergies  Chief Complaint  Patient presents with  . Medical Management of Chronic Issues    atherosclerosis of native coronary artery of native heart without angina pectoris; peripheral vascular disease of lower extremity with ulceration; insomnia unspecified type; dyslipidemia associated with type 2 diabetes.     HPI:  She is a 84 year old long term resident of this facility being seen for the management of her chronic illnesses: cad; pvd; insomnia; dyslipidemia. She denies any uncontrolled pain; no changes in appetite; no anxiety or depressive thoughts.   Past Medical History:  Diagnosis Date  . Acute gouty arthritis 11/21/2016  . Aortic atherosclerosis ([place]) 09/04/2016  . Carotid artery disease ([place]) 11/21/2016  . Carotid stenosis 12/03/2015   Advanced heavily calcified atherosclerotic disease at both carotid bifurcations. Stenoses measured at 70% affecting the right proximal ICA and 80% affecting the left proximal ICA.  Marland Kitchen Chronic kidney disease   . Coronary atherosclerosis 09/04/2016  . Diverticulitis   . DM (diabetes mellitus), type 2 ([place]) 09/04/2016  . GERD (gastroesophageal reflux disease)   . Gout   . HTN, goal below 140/90 11/21/2016  . Hyperlipidemia 06/20/2016  . Hypothyroidism, unspecified 11/21/2016  . Primary osteoarthritis 11/21/2016   involving multiple joints  . Stroke [facility])     Past Surgical History:  Procedure Laterality Date  . AMPUTATION Right 02/13/2018   Procedure: AMPUTATION RAY ( 2ND TOE );  Surgeon: [name], MD;  Location: [facility] ORS;  Service: Vascular;  Laterality: Right;  . CATARACT EXTRACTION Bilateral   . COLONOSCOPY    . Finger crystal removal     right 3rd  . LOWER EXTREMITY ANGIOGRAPHY Right 01/06/2018   Procedure: LOWER EXTREMITY ANGIOGRAPHY;  Surgeon: [name], MD;  Location: [place] [facility];   Service: Cardiovascular;  Laterality: Right;  . toe crystal removal     right 1st toe    Social History   Socioeconomic History  . Marital status: Widowed    Spouse name: Not on file  . Number of children: 0  . Years of education: 10  . Highest education level: 10th grade  Occupational History  . Not on file  Social Needs  . Financial resource strain: Not on file  . Food insecurity:    Worry: Not on file    Inability: Not on file  . Transportation needs:    Medical: Not on file    Non-medical: Not on file  Tobacco Use  . Smoking status: Never Smoker  . Smokeless tobacco: Never Used  Substance and Sexual Activity  . Alcohol use: No  . Drug use: No  . Sexual activity: Not Currently  Lifestyle  . Physical activity:    Days per week: Not on file    Minutes per session: Not on file  . Stress: Not on file  Relationships  . Social connections:    Talks on phone: Not on file    Gets together: Not on file    Attends religious service: Not on file    Active member of club or organization: Not on file    Attends meetings of clubs or organizations: Not on file    Relationship status: Not on file  . Intimate partner violence:    Fear of current or ex partner: Not on file    Emotionally abused: Not on file  Physically abused: Not on file    Forced sexual activity: Not on file  Other Topics Concern  . Not on file  Social History Narrative   Admitted to [facility] 11/16/2016   Widowed   No children   Does not drink alcohol   Never smoker   DNR   Family History  Problem Relation Age of Onset  . Hypertension Sister   . Arthritis Sister   . Heart disease Sister   . Alcohol abuse Brother   . Stroke Brother   . Hypertension Brother   . Alcohol abuse Brother   . Stroke Brother   . Hypertension Brother   . Hypertension Brother   . Hypertension Sister       VITAL SIGNS BP 114/71   Pulse 90   Temp 99 F (37.2 C)   Resp 18   Ht 5\' 2"  (1.575 m)   Wt  141 lb 9.6 oz (64.2 kg)   SpO2 96%   BMI 25.90 kg/m   Outpatient Encounter Medications as of 05/27/2018  Medication Sig  . acetaminophen (MAPAP ARTHRITIS PAIN) 650 MG CR tablet Take 650 mg 2 (two) times daily by mouth.   Marland Kitchen acetaminophen (TYLENOL) 325 MG tablet Take 650 mg by mouth every 4 (four) hours as needed. for pain/ increased temp. May be administered orally, per G-tube if needed or rectally if unable to swallow (separate order). Maximum dose for 24 hours is 3,000 mg from all sources of Acetaminophen/ Tylenol  . Amino Acids-Protein Hydrolys (FEEDING SUPPLEMENT, PRO-STAT SUGAR FREE 64,) LIQD Take 30 mLs by mouth daily. To Promote Wound Healing. Wound on right lower leg  . aspirin EC 81 MG tablet Take 1 tablet (81 mg total) by mouth daily.  . Cholecalciferol (VITAMIN D3) 2000 units capsule Take 2,000 Units by mouth daily.  . clopidogrel (PLAVIX) 75 MG tablet Take 1 tablet (75 mg total) by mouth daily.  Marland Kitchen ENSURE (ENSURE) Take 237 mLs by mouth daily.  . famotidine (PEPCID) 20 MG tablet Take 20 mg by mouth at bedtime.  Marland Kitchen latanoprost (XALATAN) 0.005 % ophthalmic solution Place 1 drop into both eyes at bedtime.  Marland Kitchen levothyroxine (SYNTHROID, LEVOTHROID) 88 MCG tablet Take 88 mcg by mouth daily before breakfast.  . magnesium hydroxide (MILK OF MAGNESIA) 400 MG/5ML suspension Take 30 mLs by mouth every 4 (four) hours as needed for mild constipation. Constipation/ no BM for 2 days  . Magnesium Oxide 250 MG TABS Take 250 mg by mouth daily.  . traMADol (ULTRAM) 50 MG tablet Take 1 tablet (50 mg total) by mouth every 6 (six) hours as needed.  . traZODone (DESYREL) 50 MG tablet Take 50 mg at bedtime by mouth.  Loura Pardon Salicylate (ASPERCREME) 10 % LOTN Apply thin film topically to areas of pain 3 times a day as needed  . UNABLE TO FIND Diet: regular  . vitamin B-12 (CYANOCOBALAMIN) 500 MCG tablet Take 500 mcg by mouth daily.  . [DISCONTINUED] camphor-menthol (SARNA) lotion Apply liberal amount to  leg around the wound/dressing for itching BID and PRN. OK to use other places of itching. DO Not use on the wound. Okay to leave on bedside  . [DISCONTINUED] gabapentin (NEURONTIN) 100 MG capsule Take 100 mg by mouth 3 (three) times daily as needed.   No facility-administered encounter medications on file as of 05/27/2018.      SIGNIFICANT DIAGNOSTIC EXAMS   TODAY:   04-29-18: ABI:  Right: Resting right ankle-brachial index is within normal range. No  evidence of significant right lower extremity arterial disease. The right toe-brachial index is abnormal. Although ankle brachial indices are within normal limits (0.95-1.29), arterial Doppler waveforms at the ankle suggest some component of arterial occlusive disease. Left: Resting left ankle-brachial index indicates noncompressible left lower extremity arteries.The left toe-brachial index is abnormal. ABIs are unreliable.  LABS REVIEWED: PREVIOUS   08-13-17: wbc 3.8; hgb 10.4; hgb 31.1; mcv 95.9; plt 157; glucose 110 bun 29; creat 1.53; k+ 5.6; na++ 139; ca 8.9; liver normal albumin 3.2 chol 112; ldl 39; trig 142; hdl 45 mag 1.3; tsh 6.302; uric acid: 9.0; vit D 37.3; vit B 12: 1919 08-14-17: glucose 105; bun 32; creat 1.51; k+ 5.3; na ++ 140; ca 8.9;  09-24-17 tsh 0.747  01-01-18: wbc 7.4; hgb 10.8; hct 32.2; mcv 94.8; plt 203 tsh 2.753 01-20-18: hgb a1c 7.6 urine microalbumin 46.6  02-06-18: wbc 6.3; hgb 10.6; hct 30.8; mcv 95.1; plt 229; glucose 169; bun 33; creat 1.25; k+ 5.1; na++ 134; ca 9.3   NO NEW LABS.      Review of Systems  Constitutional: Negative for malaise/fatigue.  Respiratory: Negative for cough and shortness of breath.   Cardiovascular: Negative for chest pain, palpitations and leg swelling.  Gastrointestinal: Negative for abdominal pain, constipation and heartburn.  Musculoskeletal: Negative for back pain, joint pain and myalgias.  Skin: Negative.   Neurological: Negative for dizziness.  Psychiatric/Behavioral: The  patient is not nervous/anxious.      Physical Exam Constitutional:      General: She is not in acute distress.    Appearance: She is well-developed. She is not diaphoretic.  Neck:     Musculoskeletal: Neck supple.     Thyroid: No thyromegaly.  Cardiovascular:     Rate and Rhythm: Normal rate and regular rhythm.     Heart sounds: Normal heart sounds.     Comments: Left pedal pulse: questionable presence Right pedal pulse very faint.     Pulmonary:     Effort: Pulmonary effort is normal. No respiratory distress.     Breath sounds: Normal breath sounds.  Abdominal:     General: Bowel sounds are normal. There is no distension.     Palpations: Abdomen is soft.     Tenderness: There is no abdominal tenderness.  Musculoskeletal:     Right lower leg: No edema.     Left lower leg: No edema.     Comments: Is able to move all extremities Is status post right 2nd ray amputation    Lymphadenopathy:     Cervical: No cervical adenopathy.  Skin:    General: Skin is warm and dry.  Neurological:     Mental Status: She is alert. Mental status is at baseline.  Psychiatric:        Mood and Affect: Mood normal.      ASSESSMENT/ PLAN:  TODAY:   1. Insomnia is stable will continue trazodone 50 mg nightly   2. Dyslipidemia associated with type 2 diabetes mellitus: is stable LDL 39 her lipitor has been stopped due to her advanced age and LDL Of 39.   3. Peripheral vascular disease of lower extremity with ulceration: is status post right 2nd toe ray amputation is stable; will continue asa 81 mg daily plavix 75 mg daily is taking tylenol 650 mg twice daily will stop gabapentin due to non use   4. Atherosclerotic heart disease of native coronary artery without angina pectoris: is stable will continue asa 81 mg daily plavix 75 mg  daily  will monitor   PREVIOUS   5. Gastroesophageal reflux disease without esophagitis: is stable will continue pepcid 20 mg daily protonix has been stopped   6.  Hypothyroidism due to acquired atrophy of thyroid: is stable tsh 2.753: will continue synthroid 88 mcg daily   7. Type 2 diabetes mellitus with stage 3 chronic kidney disease without long term use of insulin: is stable hgb a1c 7.6  will not make changes will monitor  8. CKD stage 3 due to type 2 diabetes mellitus: stable bun 33 creat 1.25  9. Primary osteo arthritis of multiple sites: is stable will continue tylenol 650 mg twice daily   10. Chronic gout due to renal impairment unspecified site without tophus: is stable will monitor     MD is aware of resident's narcotic use and is in agreement with current plan of care. We will attempt to wean resident as apropriate   Ok Edwards NP [facility]  Contact [phone] Monday through Friday 8am- 5pm  After hours call [phone]

## 2018-05-28 DIAGNOSIS — E785 Hyperlipidemia, unspecified: Secondary | ICD-10-CM

## 2018-05-28 DIAGNOSIS — E1169 Type 2 diabetes mellitus with other specified complication: Secondary | ICD-10-CM | POA: Insufficient documentation

## 2018-06-04 DIAGNOSIS — I739 Peripheral vascular disease, unspecified: Secondary | ICD-10-CM | POA: Diagnosis not present

## 2018-06-04 DIAGNOSIS — I10 Essential (primary) hypertension: Secondary | ICD-10-CM | POA: Diagnosis not present

## 2018-06-04 DIAGNOSIS — E034 Atrophy of thyroid (acquired): Secondary | ICD-10-CM | POA: Diagnosis not present

## 2018-06-04 DIAGNOSIS — I70235 Atherosclerosis of native arteries of right leg with ulceration of other part of foot: Secondary | ICD-10-CM | POA: Diagnosis not present

## 2018-06-25 DIAGNOSIS — E1151 Type 2 diabetes mellitus with diabetic peripheral angiopathy without gangrene: Secondary | ICD-10-CM | POA: Diagnosis not present

## 2018-06-25 DIAGNOSIS — Z89421 Acquired absence of other right toe(s): Secondary | ICD-10-CM | POA: Diagnosis not present

## 2018-06-25 DIAGNOSIS — R279 Unspecified lack of coordination: Secondary | ICD-10-CM | POA: Diagnosis not present

## 2018-06-25 DIAGNOSIS — I779 Disorder of arteries and arterioles, unspecified: Secondary | ICD-10-CM | POA: Diagnosis not present

## 2018-06-25 DIAGNOSIS — H409 Unspecified glaucoma: Secondary | ICD-10-CM | POA: Diagnosis not present

## 2018-06-25 DIAGNOSIS — R41841 Cognitive communication deficit: Secondary | ICD-10-CM | POA: Diagnosis not present

## 2018-06-27 ENCOUNTER — Non-Acute Institutional Stay (SKILLED_NURSING_FACILITY): Payer: Medicare Other | Admitting: Adult Health

## 2018-06-27 ENCOUNTER — Encounter: Payer: Self-pay | Admitting: Adult Health

## 2018-06-27 DIAGNOSIS — E034 Atrophy of thyroid (acquired): Secondary | ICD-10-CM

## 2018-06-27 DIAGNOSIS — Z89421 Acquired absence of other right toe(s): Secondary | ICD-10-CM

## 2018-06-27 DIAGNOSIS — N183 Chronic kidney disease, stage 3 unspecified: Secondary | ICD-10-CM

## 2018-06-27 DIAGNOSIS — E1122 Type 2 diabetes mellitus with diabetic chronic kidney disease: Secondary | ICD-10-CM

## 2018-06-27 DIAGNOSIS — K219 Gastro-esophageal reflux disease without esophagitis: Secondary | ICD-10-CM

## 2018-06-27 NOTE — Progress Notes (Signed)
[facility]   [facility] Room Number: [place] of Service:  SNF (31)   CODE STATUS: DNR  No Known Allergies  Chief Complaint  Patient presents with  . Medical Management of Chronic Issues    Type 2 diabetes mellitus with stage 3 chronic kidney disease without long term current use of insulin; hypothyroidism due to acquired atrophy of thyroid; gastroesophageal reflux disease without esophagitis; CKD stage 3 due to type 2 diabetes mellitus;     HPI:  She is a 85 year old long term resident of this facility being seen for the management of her chronic illnesses: diabetes; hypothyroidism; gerd; ckd. She denies any uncontrolled pain; no changes in appetite; no insomnia or anxiety.   Past Medical History:  Diagnosis Date  . Acute gouty arthritis 11/21/2016  . Aortic atherosclerosis ([place]) 09/04/2016  . Carotid artery disease ([place]) 11/21/2016  . Carotid stenosis 12/03/2015   Advanced heavily calcified atherosclerotic disease at both carotid bifurcations. Stenoses measured at 70% affecting the right proximal ICA and 80% affecting the left proximal ICA.  Marland Kitchen Chronic kidney disease   . Coronary atherosclerosis 09/04/2016  . Diverticulitis   . DM (diabetes mellitus), type 2 ([place]) 09/04/2016  . GERD (gastroesophageal reflux disease)   . Gout   . HTN, goal below 140/90 11/21/2016  . Hyperlipidemia 06/20/2016  . Hypothyroidism, unspecified 11/21/2016  . Primary osteoarthritis 11/21/2016   involving multiple joints  . Stroke [facility])     Past Surgical History:  Procedure Laterality Date  . AMPUTATION Right 02/13/2018   Procedure: AMPUTATION RAY ( 2ND TOE );  Surgeon: [name], MD;  Location: [facility] ORS;  Service: Vascular;  Laterality: Right;  . CATARACT EXTRACTION Bilateral   . COLONOSCOPY    . Finger crystal removal     right 3rd  . LOWER EXTREMITY ANGIOGRAPHY Right 01/06/2018   Procedure: LOWER EXTREMITY ANGIOGRAPHY;  Surgeon: [name], MD;  Location: [place] CV LAB;  Service: Cardiovascular;  Laterality: Right;  . toe crystal removal     right 1st toe    Social History   Socioeconomic History  . Marital status: Widowed    Spouse name: Not on file  . Number of children: 0  . Years of education: 10  . Highest education level: 10th grade  Occupational History  . Not on file  Social Needs  . Financial resource strain: Not on file  . Food insecurity:    Worry: Not on file    Inability: Not on file  . Transportation needs:    Medical: Not on file    Non-medical: Not on file  Tobacco Use  . Smoking status: Never Smoker  . Smokeless tobacco: Never Used  Substance and Sexual Activity  . Alcohol use: No  . Drug use: No  . Sexual activity: Not Currently  Lifestyle  . Physical activity:    Days per week: Not on file    Minutes per session: Not on file  . Stress: Not on file  Relationships  . Social connections:    Talks on phone: Not on file    Gets together: Not on file    Attends religious service: Not on file    Active member of club or organization: Not on file    Attends meetings of clubs or organizations: Not on file    Relationship status: Not on file  . Intimate partner violence:    Fear of current or ex partner: Not on  file    Emotionally abused: Not on file    Physically abused: Not on file    Forced sexual activity: Not on file  Other Topics Concern  . Not on file  Social History Narrative   Admitted to [place] 11/16/2016   Widowed   No children   Does not drink alcohol   Never smoker   DNR   Family History  Problem Relation Age of Onset  . Hypertension Sister   . Arthritis Sister   . Heart disease Sister   . Alcohol abuse Brother   . Stroke Brother   . Hypertension Brother   . Alcohol abuse Brother   . Stroke Brother   . Hypertension Brother   . Hypertension Brother   . Hypertension Sister       VITAL SIGNS BP 134/63   Pulse 87   Temp 98.3 F (36.8 C)   Resp 20   Ht 5'  2" (1.575 m)   Wt 140 lb 12.8 oz (63.9 kg)   SpO2 99%   BMI 25.75 kg/m   Outpatient Encounter Medications as of 06/27/2018  Medication Sig  . acetaminophen (MAPAP ARTHRITIS PAIN) 650 MG CR tablet Take 650 mg 2 (two) times daily by mouth.   Marland Kitchen acetaminophen (TYLENOL) 325 MG tablet Take 650 mg by mouth every 4 (four) hours as needed. for pain/ increased temp. May be administered orally, per G-tube if needed or rectally if unable to swallow (separate order). Maximum dose for 24 hours is 3,000 mg from all sources of Acetaminophen/ Tylenol  . Amino Acids-Protein Hydrolys (FEEDING SUPPLEMENT, PRO-STAT SUGAR FREE 64,) LIQD Take 30 mLs by mouth daily. To Promote Wound Healing. Wound on right lower leg  . aspirin EC 81 MG tablet Take 1 tablet (81 mg total) by mouth daily.  . Cholecalciferol (VITAMIN D3) 2000 units capsule Take 2,000 Units by mouth daily.  . clopidogrel (PLAVIX) 75 MG tablet Take 1 tablet (75 mg total) by mouth daily.  Marland Kitchen ENSURE (ENSURE) Take 237 mLs by mouth daily.  . famotidine (PEPCID) 20 MG tablet Take 20 mg by mouth at bedtime.  . [place] (DERMACLOUD) CREA Apply liberal amount topically to area of skin irritation as needed.  OK to leave at bedside  . latanoprost (XALATAN) 0.005 % ophthalmic solution Place 1 drop into both eyes at bedtime.  Marland Kitchen levothyroxine (SYNTHROID, LEVOTHROID) 88 MCG tablet Take 88 mcg by mouth daily before breakfast.  . magnesium hydroxide (MILK OF MAGNESIA) 400 MG/5ML suspension Take 30 mLs by mouth every 4 (four) hours as needed for mild constipation. Constipation/ no BM for 2 days  . Magnesium Oxide 250 MG TABS Take 250 mg by mouth daily.  . traMADol (ULTRAM) 50 MG tablet Take 1 tablet (50 mg total) by mouth every 6 (six) hours as needed.  . traZODone (DESYREL) 50 MG tablet Take 50 mg at bedtime by mouth.  Loura Pardon Salicylate (ASPERCREME) 10 % LOTN Apply thin film topically to areas of pain 3 times a day as needed  . UNABLE TO FIND Diet: regular   . vitamin B-12 (CYANOCOBALAMIN) 500 MCG tablet Take 500 mcg by mouth daily.   No facility-administered encounter medications on file as of 06/27/2018.      SIGNIFICANT DIAGNOSTIC EXAMS  PREVIOUS:   04-29-18: ABI:  Right: Resting right ankle-brachial index is within normal range. No evidence of significant right lower extremity arterial disease. The right toe-brachial index is abnormal. Although ankle brachial indices are within normal  limits (0.95-1.29), arterial Doppler waveforms at the ankle suggest some component of arterial occlusive disease. Left: Resting left ankle-brachial index indicates noncompressible left lower extremity arteries.The left toe-brachial index is abnormal. ABIs are unreliable.  NO NEW EXAMS.   LABS REVIEWED: PREVIOUS   08-13-17: wbc 3.8; hgb 10.4; hgb 31.1; mcv 95.9; plt 157; glucose 110 bun 29; creat 1.53; k+ 5.6; na++ 139; ca 8.9; liver normal albumin 3.2 chol 112; ldl 39; trig 142; hdl 45 mag 1.3; tsh 6.302; uric acid: 9.0; vit D 37.3; vit B 12: 1919 08-14-17: glucose 105; bun 32; creat 1.51; k+ 5.3; na ++ 140; ca 8.9;  09-24-17 tsh 0.747  01-01-18: wbc 7.4; hgb 10.8; hct 32.2; mcv 94.8; plt 203 tsh 2.753 01-20-18: hgb a1c 7.6 urine microalbumin 46.6  02-06-18: wbc 6.3; hgb 10.6; hct 30.8; mcv 95.1; plt 229; glucose 169; bun 33; creat 1.25; k+ 5.1; na++ 134; ca 9.3   NO NEW LABS.    Review of Systems  Constitutional: Negative for malaise/fatigue.  Respiratory: Negative for cough and shortness of breath.   Cardiovascular: Negative for chest pain, palpitations and leg swelling.  Gastrointestinal: Negative for abdominal pain, constipation and heartburn.  Musculoskeletal: Negative for back pain, joint pain and myalgias.  Skin: Negative.   Neurological: Negative for dizziness.  Psychiatric/Behavioral: The patient is not nervous/anxious.     Physical Exam Constitutional:      General: She is not in acute distress.    Appearance: She is well-developed. She is  not diaphoretic.  Neck:     Thyroid: No thyromegaly.  Cardiovascular:     Rate and Rhythm: Normal rate and regular rhythm.     Heart sounds: Normal heart sounds.     Comments: Left pedal pulse: questionable presence Right pedal pulse very faint.   Pulmonary:     Effort: Pulmonary effort is normal. No respiratory distress.     Breath sounds: Normal breath sounds.  Abdominal:     General: Bowel sounds are normal. There is no distension.     Palpations: Abdomen is soft.     Tenderness: There is no abdominal tenderness.  Musculoskeletal:     Right lower leg: No edema.     Left lower leg: No edema.     Comments: Is able to move all extremities Is status post right 2nd ray amputation     Lymphadenopathy:     Cervical: No cervical adenopathy.  Skin:    General: Skin is warm and dry.  Neurological:     Mental Status: She is alert. Mental status is at baseline.  Psychiatric:        Mood and Affect: Mood normal.        ASSESSMENT/ PLAN:  TODAY:   1. Gastroesophageal reflux disease without esophagitis: is stable will continue pepcid 20 mg daily protonix has been stopped   2. Hypothyroidism due to acquired atrophy of thyroid: is stable tsh 2.753: will continue synthroid 88 mcg daily   3. Type 2 diabetes mellitus with stage 3 chronic kidney disease without long term use of insulin: is stable hgb a1c 7.6  will not make changes will monitor  4. CKD stage 3 due to type 2 diabetes mellitus: stable bun 33 creat 1.25  PREVIOUS   5. Primary osteo arthritis of multiple sites: is stable will continue tylenol 650 mg twice daily   6. Chronic gout due to renal impairment unspecified site without tophus: is stable will monitor   7. Insomnia is stable will continue trazodone 50  mg nightly   8. Dyslipidemia associated with type 2 diabetes mellitus: is stable LDL 39 her lipitor has been stopped due to her advanced age and LDL Of 39.   9. Peripheral vascular disease of lower extremity with  ulceration: is status post right 2nd toe ray amputation is stable; will continue asa 81 mg daily plavix 75 mg daily is taking tylenol 650 mg twice daily   10. Atherosclerotic heart disease of native coronary artery without angina pectoris: is stable will continue asa 81 mg daily plavix 75 mg daily  will monitor    Will check cbc; cmp; tsh hgb  a1c vit d   MD is aware of resident's narcotic use and is in agreement with current plan of care. We will attempt to wean resident as apropriate   Ok Edwards NP [facility], [facility], [facility]  Contact [phone] Monday through Friday 8am- 5pm  After hours call [phone]

## 2018-06-30 ENCOUNTER — Encounter
Admission: RE | Admit: 2018-06-30 | Discharge: 2018-06-30 | Disposition: A | Discharge status: Home / Self Care | Payer: Medicare Other | Source: Ambulatory Visit | Attending: Internal Medicine | Admitting: Internal Medicine

## 2018-07-01 DIAGNOSIS — R279 Unspecified lack of coordination: Secondary | ICD-10-CM | POA: Diagnosis not present

## 2018-07-01 DIAGNOSIS — Z89421 Acquired absence of other right toe(s): Secondary | ICD-10-CM | POA: Diagnosis not present

## 2018-07-01 DIAGNOSIS — E1151 Type 2 diabetes mellitus with diabetic peripheral angiopathy without gangrene: Secondary | ICD-10-CM | POA: Diagnosis not present

## 2018-07-01 DIAGNOSIS — R41841 Cognitive communication deficit: Secondary | ICD-10-CM | POA: Diagnosis not present

## 2018-07-01 DIAGNOSIS — H409 Unspecified glaucoma: Secondary | ICD-10-CM | POA: Diagnosis not present

## 2018-07-01 DIAGNOSIS — I779 Disorder of arteries and arterioles, unspecified: Secondary | ICD-10-CM | POA: Diagnosis not present

## 2018-07-02 DIAGNOSIS — E1151 Type 2 diabetes mellitus with diabetic peripheral angiopathy without gangrene: Secondary | ICD-10-CM | POA: Diagnosis not present

## 2018-07-02 DIAGNOSIS — R279 Unspecified lack of coordination: Secondary | ICD-10-CM | POA: Diagnosis not present

## 2018-07-02 DIAGNOSIS — Z89421 Acquired absence of other right toe(s): Secondary | ICD-10-CM | POA: Diagnosis not present

## 2018-07-02 DIAGNOSIS — R41841 Cognitive communication deficit: Secondary | ICD-10-CM | POA: Diagnosis not present

## 2018-07-02 DIAGNOSIS — I779 Disorder of arteries and arterioles, unspecified: Secondary | ICD-10-CM | POA: Diagnosis not present

## 2018-07-02 DIAGNOSIS — H409 Unspecified glaucoma: Secondary | ICD-10-CM | POA: Diagnosis not present

## 2018-07-03 DIAGNOSIS — Z89421 Acquired absence of other right toe(s): Secondary | ICD-10-CM | POA: Diagnosis not present

## 2018-07-03 DIAGNOSIS — R279 Unspecified lack of coordination: Secondary | ICD-10-CM | POA: Diagnosis not present

## 2018-07-03 DIAGNOSIS — H409 Unspecified glaucoma: Secondary | ICD-10-CM | POA: Diagnosis not present

## 2018-07-03 DIAGNOSIS — R41841 Cognitive communication deficit: Secondary | ICD-10-CM | POA: Diagnosis not present

## 2018-07-03 DIAGNOSIS — I779 Disorder of arteries and arterioles, unspecified: Secondary | ICD-10-CM | POA: Diagnosis not present

## 2018-07-03 DIAGNOSIS — E1151 Type 2 diabetes mellitus with diabetic peripheral angiopathy without gangrene: Secondary | ICD-10-CM | POA: Diagnosis not present

## 2018-07-04 ENCOUNTER — Non-Acute Institutional Stay (SKILLED_NURSING_FACILITY): Payer: Medicare Other | Admitting: Adult Health

## 2018-07-04 ENCOUNTER — Other Ambulatory Visit
Admission: RE | Admit: 2018-07-04 | Discharge: 2018-07-04 | Disposition: A | Discharge status: Home / Self Care | Payer: Medicare Other | Source: Ambulatory Visit | Attending: Adult Health | Admitting: Adult Health

## 2018-07-04 ENCOUNTER — Encounter: Payer: Self-pay | Admitting: Adult Health

## 2018-07-04 DIAGNOSIS — R279 Unspecified lack of coordination: Secondary | ICD-10-CM | POA: Diagnosis not present

## 2018-07-04 DIAGNOSIS — I10 Essential (primary) hypertension: Secondary | ICD-10-CM | POA: Diagnosis present

## 2018-07-04 DIAGNOSIS — I129 Hypertensive chronic kidney disease with stage 1 through stage 4 chronic kidney disease, or unspecified chronic kidney disease: Secondary | ICD-10-CM | POA: Diagnosis not present

## 2018-07-04 DIAGNOSIS — N179 Acute kidney failure, unspecified: Secondary | ICD-10-CM | POA: Diagnosis not present

## 2018-07-04 DIAGNOSIS — H409 Unspecified glaucoma: Secondary | ICD-10-CM | POA: Diagnosis not present

## 2018-07-04 DIAGNOSIS — D649 Anemia, unspecified: Secondary | ICD-10-CM | POA: Diagnosis not present

## 2018-07-04 DIAGNOSIS — I779 Disorder of arteries and arterioles, unspecified: Secondary | ICD-10-CM | POA: Diagnosis not present

## 2018-07-04 DIAGNOSIS — E1151 Type 2 diabetes mellitus with diabetic peripheral angiopathy without gangrene: Secondary | ICD-10-CM | POA: Diagnosis not present

## 2018-07-04 DIAGNOSIS — R41841 Cognitive communication deficit: Secondary | ICD-10-CM | POA: Diagnosis not present

## 2018-07-04 DIAGNOSIS — Z89421 Acquired absence of other right toe(s): Secondary | ICD-10-CM | POA: Diagnosis not present

## 2018-07-04 LAB — COMPREHENSIVE METABOLIC PANEL
ALT: 16 U/L (ref 0–44)
AST: 26 U/L (ref 15–41)
Albumin: 3.1 g/dL — ABNORMAL LOW (ref 3.5–5.0)
Alkaline Phosphatase: 75 U/L (ref 38–126)
Anion gap: 10 (ref 5–15)
BUN: 53 mg/dL — AB (ref 8–23)
CALCIUM: 8.9 mg/dL (ref 8.9–10.3)
CHLORIDE: 102 mmol/L (ref 98–111)
CO2: 21 mmol/L — ABNORMAL LOW (ref 22–32)
CREATININE: 1.39 mg/dL — AB (ref 0.44–1.00)
GFR, EST AFRICAN AMERICAN: 37 mL/min — AB (ref >60)
GFR, EST NON AFRICAN AMERICAN: 32 mL/min — AB (ref >60)
Glucose, Bld: 345 mg/dL — ABNORMAL HIGH (ref 70–99)
Potassium: 5 mmol/L (ref 3.5–5.1)
Sodium: 133 mmol/L — ABNORMAL LOW (ref 135–145)
TOTAL PROTEIN: 6.1 g/dL — AB (ref 6.5–8.1)
Total Bilirubin: 0.5 mg/dL (ref 0.3–1.2)

## 2018-07-04 LAB — CBC WITH DIFFERENTIAL/PLATELET
ABS IMMATURE GRANULOCYTES: 0.04 10*3/uL (ref 0.00–0.07)
BASOS PCT: 1 %
Basophils Absolute: 0.1 10*3/uL (ref 0.0–0.1)
EOS ABS: 0.2 10*3/uL (ref 0.0–0.5)
Eosinophils Relative: 2 %
HEMATOCRIT: 26.5 % — AB (ref 36.0–46.0)
Hemoglobin: 7.7 g/dL — ABNORMAL LOW (ref 12.0–15.0)
Immature Granulocytes: 1 %
LYMPHS ABS: 1.3 10*3/uL (ref 0.7–4.0)
Lymphocytes Relative: 15 %
MCH: 27.1 pg (ref 26.0–34.0)
MCHC: 29.1 g/dL — ABNORMAL LOW (ref 30.0–36.0)
MCV: 93.3 fL (ref 80.0–100.0)
MONO ABS: 1.2 10*3/uL — AB (ref 0.1–1.0)
Monocytes Relative: 13 %
NEUTROS ABS: 6 10*3/uL (ref 1.7–7.7)
Neutrophils Relative %: 68 %
PLATELETS: 207 10*3/uL (ref 150–400)
RBC: 2.84 MIL/uL — ABNORMAL LOW (ref 3.87–5.11)
RDW: 16.4 % — ABNORMAL HIGH (ref 11.5–15.5)
WBC: 8.7 10*3/uL (ref 4.0–10.5)
nRBC: 0 % (ref 0.0–0.2)

## 2018-07-04 NOTE — Progress Notes (Signed)
[facility]:   [facility] Room Number: [place] of Service:  SNF (31)   CODE STATUS: DNR  No Known Allergies  Chief Complaint  Patient presents with  . Acute Visit    Pain Management    HPI:  She has not used her as needed tramadol. Staff reports that she has been having nausea/vomiting/diarrhea. She has been give zofran and imodium. She has had "coffee ground stools". She is unable to participate in the hpi or ros. There are no reports of fevers.   Past Medical History:  Diagnosis Date  . Acute gouty arthritis 11/21/2016  . Aortic atherosclerosis ([place]) 09/04/2016  . Carotid artery disease ([place]) 11/21/2016  . Carotid stenosis 12/03/2015   Advanced heavily calcified atherosclerotic disease at both carotid bifurcations. Stenoses measured at 70% affecting the right proximal ICA and 80% affecting the left proximal ICA.  Marland Kitchen Chronic kidney disease   . Coronary atherosclerosis 09/04/2016  . Diverticulitis   . DM (diabetes mellitus), type 2 ([place]) 09/04/2016  . GERD (gastroesophageal reflux disease)   . Gout   . HTN, goal below 140/90 11/21/2016  . Hyperlipidemia 06/20/2016  . Hypothyroidism, unspecified 11/21/2016  . Primary osteoarthritis 11/21/2016   involving multiple joints  . Stroke [facility])     Past Surgical History:  Procedure Laterality Date  . AMPUTATION Right 02/13/2018   Procedure: AMPUTATION RAY ( 2ND TOE );  Surgeon: [name], MD;  Location: [facility] ORS;  Service: Vascular;  Laterality: Right;  . CATARACT EXTRACTION Bilateral   . COLONOSCOPY    . Finger crystal removal     right 3rd  . LOWER EXTREMITY ANGIOGRAPHY Right 01/06/2018   Procedure: LOWER EXTREMITY ANGIOGRAPHY;  Surgeon: [name], MD;  Location: [place] [facility] LAB;  Service: Cardiovascular;  Laterality: Right;  . toe crystal removal     right 1st toe    Social History   Socioeconomic History  . Marital status: Widowed    Spouse name: Not on file  . Number of children: 0   . Years of education: 10  . Highest education level: 10th grade  Occupational History  . Not on file  Social Needs  . Financial resource strain: Not on file  . Food insecurity:    Worry: Not on file    Inability: Not on file  . Transportation needs:    Medical: Not on file    Non-medical: Not on file  Tobacco Use  . Smoking status: Never Smoker  . Smokeless tobacco: Never Used  Substance and Sexual Activity  . Alcohol use: No  . Drug use: No  . Sexual activity: Not Currently  Lifestyle  . Physical activity:    Days per week: Not on file    Minutes per session: Not on file  . Stress: Not on file  Relationships  . Social connections:    Talks on phone: Not on file    Gets together: Not on file    Attends religious service: Not on file    Active member of club or organization: Not on file    Attends meetings of clubs or organizations: Not on file    Relationship status: Not on file  . Intimate partner violence:    Fear of current or ex partner: Not on file    Emotionally abused: Not on file    Physically abused: Not on file    Forced sexual activity: Not on file  Other Topics Concern  . Not  on file  Social History Narrative   Admitted to [place] 11/16/2016   Widowed   No children   Does not drink alcohol   Never smoker   DNR   Family History  Problem Relation Age of Onset  . Hypertension Sister   . Arthritis Sister   . Heart disease Sister   . Alcohol abuse Brother   . Stroke Brother   . Hypertension Brother   . Alcohol abuse Brother   . Stroke Brother   . Hypertension Brother   . Hypertension Brother   . Hypertension Sister       VITAL SIGNS BP (!) 110/55   Pulse 95   Temp 98.6 F (37 C)   Resp 16   Ht 5\' 2"  (1.575 m)   Wt 141 lb 1.6 oz (64 kg)   SpO2 96%   BMI 25.81 kg/m   Outpatient Encounter Medications as of 07/04/2018  Medication Sig  . acetaminophen (MAPAP ARTHRITIS PAIN) 650 MG CR tablet Take 650 mg 2 (two) times daily by  mouth.   Marland Kitchen acetaminophen (TYLENOL) 325 MG tablet Take 650 mg by mouth every 4 (four) hours as needed. for pain/ increased temp. May be administered orally, per G-tube if needed or rectally if unable to swallow (separate order). Maximum dose for 24 hours is 3,000 mg from all sources of Acetaminophen/ Tylenol  . alum & mag hydroxide-simeth (MAALOX PLUS) 400-400-40 MG/5ML suspension Take 30 mLs by mouth every 4 (four) hours as needed for indigestion.  . Amino Acids-Protein Hydrolys (FEEDING SUPPLEMENT, PRO-STAT SUGAR FREE 64,) LIQD Take 30 mLs by mouth daily. To Promote Wound Healing. Wound on right lower leg  . aspirin EC 81 MG tablet Take 1 tablet (81 mg total) by mouth daily.  . Cholecalciferol (VITAMIN D3) 2000 units capsule Take 2,000 Units by mouth daily.  . clopidogrel (PLAVIX) 75 MG tablet Take 1 tablet (75 mg total) by mouth daily.  Marland Kitchen ENSURE (ENSURE) Take 237 mLs by mouth daily.  . famotidine (PEPCID) 20 MG tablet Take 20 mg by mouth at bedtime.  . [place] (DERMACLOUD) CREA Apply liberal amount topically to area of skin irritation as needed.  OK to leave at bedside  . latanoprost (XALATAN) 0.005 % ophthalmic solution Place 1 drop into both eyes at bedtime.  Marland Kitchen levothyroxine (SYNTHROID, LEVOTHROID) 88 MCG tablet Take 88 mcg by mouth daily before breakfast.  . loperamide (IMODIUM A-D) 2 MG tablet Give 2 tablets (4 mg) by mouth with first loose stool, then 1 tablet (2 mg) with each subsequent loose stool up to 8 doses in 24 hours  . magnesium hydroxide (MILK OF MAGNESIA) 400 MG/5ML suspension Take 30 mLs by mouth every 4 (four) hours as needed for mild constipation. Constipation/ no BM for 2 days  . Magnesium Oxide 250 MG TABS Take 250 mg by mouth daily.  . ondansetron (ZOFRAN) 4 MG tablet Take 4 mg by mouth every 6 (six) hours as needed for nausea or vomiting.  Cathie Olden (AQUACEL-AG FOAM EX) Cleanse wound with normal saline, skin prep periwound, cut Aquacel to size of wound and apply  to wound base, cover with allevyn  . traZODone (DESYREL) 50 MG tablet Take 50 mg at bedtime by mouth.  Loura Pardon Salicylate (ASPERCREME) 10 % LOTN Apply thin film topically to areas of pain 3 times a day as needed  . UNABLE TO FIND Diet: regular. Low NA, NCS  . vitamin B-12 (CYANOCOBALAMIN) 500 MCG tablet Take 500 mcg  by mouth daily.  . [DISCONTINUED] traMADol (ULTRAM) 50 MG tablet Take 1 tablet (50 mg total) by mouth every 6 (six) hours as needed. (Patient not taking: Reported on 07/04/2018)   No facility-administered encounter medications on file as of 07/04/2018.      SIGNIFICANT DIAGNOSTIC EXAMS  PREVIOUS:   04-29-18: ABI:  Right: Resting right ankle-brachial index is within normal range. No evidence of significant right lower extremity arterial disease. The right toe-brachial index is abnormal. Although ankle brachial indices are within normal limits (0.95-1.29), arterial Doppler waveforms at the ankle suggest some component of arterial occlusive disease. Left: Resting left ankle-brachial index indicates noncompressible left lower extremity arteries.The left toe-brachial index is abnormal. ABIs are unreliable.  NO NEW EXAMS.   LABS REVIEWED: PREVIOUS   08-13-17: wbc 3.8; hgb 10.4; hgb 31.1; mcv 95.9; plt 157; glucose 110 bun 29; creat 1.53; k+ 5.6; na++ 139; ca 8.9; liver normal albumin 3.2 chol 112; ldl 39; trig 142; hdl 45 mag 1.3; tsh 6.302; uric acid: 9.0; vit D 37.3; vit B 12: 1919 08-14-17: glucose 105; bun 32; creat 1.51; k+ 5.3; na ++ 140; ca 8.9;  09-24-17 tsh 0.747  01-01-18: wbc 7.4; hgb 10.8; hct 32.2; mcv 94.8; plt 203 tsh 2.753 01-20-18: hgb a1c 7.6 urine microalbumin 46.6  02-06-18: wbc 6.3; hgb 10.6; hct 30.8; mcv 95.1; plt 229; glucose 169; bun 33; creat 1.25; k+ 5.1; na++ 134; ca 9.3   TODAY;   07-04-18: wbc 8.7; hgb 7.7 ;hct 26.5; mcv 93.3; plt 207; glucose 345; bun 53; creat 1.39  k+ 5.0; na++ 133; ca 8.9 liver normal albumin 3.1   Review of Systems  Unable to  perform ROS: Medical condition    Physical Exam Constitutional:      General: She is not in acute distress.    Appearance: She is well-developed. She is not diaphoretic.  Neck:     Thyroid: No thyromegaly.  Cardiovascular:     Rate and Rhythm: Normal rate and regular rhythm.     Heart sounds: Normal heart sounds.     Comments:  Left pedal pulse: questionable presence Right pedal pulse very faint.  Pulmonary:     Effort: Pulmonary effort is normal. No respiratory distress.     Breath sounds: Normal breath sounds.  Abdominal:     General: Bowel sounds are normal. There is no distension.     Palpations: Abdomen is soft.     Tenderness: There is no abdominal tenderness.  Musculoskeletal:     Right lower leg: No edema.     Left lower leg: No edema.     Comments: Is able to move all extremities Is status post right 2nd ray amputation      Lymphadenopathy:     Cervical: No cervical adenopathy.  Skin:    General: Skin is warm and dry.  Neurological:     Mental Status: She is alert. Mental status is at baseline.  Psychiatric:        Mood and Affect: Mood normal.      ASSESSMENT/ PLAN:  TODAY:   1.  Acute anemia  2. Acute renal failure Is worse Will stop tramadol Will begin NS 100 cc per hour for 2 liters Will get cbc cmp  kub fobt and iron studies Will repeat cbc on 07-07-18       MD is aware of resident's narcotic use and is in agreement with current plan of care. We will attempt to wean resident as apropriate   Neoma Laming  Green NP [facility] Adult Medicine  Contact [phone] Monday through Friday 8am- 5pm  After hours call [phone]

## 2018-07-05 DIAGNOSIS — R112 Nausea with vomiting, unspecified: Secondary | ICD-10-CM | POA: Diagnosis not present

## 2018-07-07 ENCOUNTER — Encounter: Payer: Self-pay | Admitting: Adult Health

## 2018-07-07 ENCOUNTER — Non-Acute Institutional Stay (SKILLED_NURSING_FACILITY): Payer: Medicare Other | Admitting: Adult Health

## 2018-07-07 ENCOUNTER — Other Ambulatory Visit
Admission: RE | Admit: 2018-07-07 | Discharge: 2018-07-07 | Disposition: A | Discharge status: Home / Self Care | Payer: Medicare Other | Source: Skilled Nursing Facility | Attending: Adult Health | Admitting: Adult Health

## 2018-07-07 DIAGNOSIS — I129 Hypertensive chronic kidney disease with stage 1 through stage 4 chronic kidney disease, or unspecified chronic kidney disease: Secondary | ICD-10-CM | POA: Diagnosis not present

## 2018-07-07 DIAGNOSIS — K5909 Other constipation: Secondary | ICD-10-CM | POA: Diagnosis not present

## 2018-07-07 DIAGNOSIS — E1151 Type 2 diabetes mellitus with diabetic peripheral angiopathy without gangrene: Secondary | ICD-10-CM | POA: Diagnosis not present

## 2018-07-07 DIAGNOSIS — D649 Anemia, unspecified: Secondary | ICD-10-CM | POA: Diagnosis not present

## 2018-07-07 DIAGNOSIS — H409 Unspecified glaucoma: Secondary | ICD-10-CM | POA: Diagnosis not present

## 2018-07-07 DIAGNOSIS — Z89421 Acquired absence of other right toe(s): Secondary | ICD-10-CM | POA: Diagnosis not present

## 2018-07-07 DIAGNOSIS — I779 Disorder of arteries and arterioles, unspecified: Secondary | ICD-10-CM | POA: Diagnosis not present

## 2018-07-07 DIAGNOSIS — R279 Unspecified lack of coordination: Secondary | ICD-10-CM | POA: Diagnosis not present

## 2018-07-07 DIAGNOSIS — R41841 Cognitive communication deficit: Secondary | ICD-10-CM | POA: Diagnosis not present

## 2018-07-07 LAB — COMPREHENSIVE METABOLIC PANEL
ALT: 21 U/L (ref 0–44)
AST: 29 U/L (ref 15–41)
Albumin: 3.4 g/dL — ABNORMAL LOW (ref 3.5–5.0)
Alkaline Phosphatase: 106 U/L (ref 38–126)
Anion gap: 8 (ref 5–15)
BUN: 40 mg/dL — ABNORMAL HIGH (ref 8–23)
CO2: 25 mmol/L (ref 22–32)
Calcium: 9 mg/dL (ref 8.9–10.3)
Chloride: 103 mmol/L (ref 98–111)
Creatinine, Ser: 1.09 mg/dL — ABNORMAL HIGH (ref 0.44–1.00)
GFR calc Af Amer: 49 mL/min — ABNORMAL LOW (ref >60)
GFR calc non Af Amer: 43 mL/min — ABNORMAL LOW (ref >60)
Glucose, Bld: 347 mg/dL — ABNORMAL HIGH (ref 70–99)
Potassium: 4.7 mmol/L (ref 3.5–5.1)
Sodium: 136 mmol/L (ref 135–145)
Total Bilirubin: 0.6 mg/dL (ref 0.3–1.2)
Total Protein: 6.6 g/dL (ref 6.5–8.1)

## 2018-07-07 LAB — IRON AND TIBC
Iron: 59 ug/dL (ref 28–170)
Saturation Ratios: 14 % (ref 10.4–31.8)
TIBC: 412 ug/dL (ref 250–450)
UIBC: 353 ug/dL

## 2018-07-07 LAB — CBC WITH DIFFERENTIAL/PLATELET
ABS IMMATURE GRANULOCYTES: 0.04 10*3/uL (ref 0.00–0.07)
BASOS PCT: 1 %
Basophils Absolute: 0.1 10*3/uL (ref 0.0–0.1)
EOS ABS: 0.2 10*3/uL (ref 0.0–0.5)
Eosinophils Relative: 4 %
HCT: 27.6 % — ABNORMAL LOW (ref 36.0–46.0)
Hemoglobin: 7.8 g/dL — ABNORMAL LOW (ref 12.0–15.0)
IMMATURE GRANULOCYTES: 1 %
Lymphocytes Relative: 21 %
Lymphs Abs: 1.1 10*3/uL (ref 0.7–4.0)
MCH: 27.1 pg (ref 26.0–34.0)
MCHC: 28.3 g/dL — ABNORMAL LOW (ref 30.0–36.0)
MCV: 95.8 fL (ref 80.0–100.0)
MONO ABS: 0.8 10*3/uL (ref 0.1–1.0)
Monocytes Relative: 15 %
NEUTROS ABS: 3.1 10*3/uL (ref 1.7–7.7)
NEUTROS PCT: 58 %
PLATELETS: 215 10*3/uL (ref 150–400)
RBC: 2.88 MIL/uL — AB (ref 3.87–5.11)
RDW: 16.3 % — AB (ref 11.5–15.5)
WBC: 5.3 10*3/uL (ref 4.0–10.5)
nRBC: 0 % (ref 0.0–0.2)

## 2018-07-07 LAB — FERRITIN: Ferritin: 19 ng/mL (ref 11–307)

## 2018-07-07 NOTE — Progress Notes (Signed)
[facility]   [facility] Room Number: [place] of Service:  SNF (31)   CODE STATUS: DNR  No Known Allergies  Chief Complaint  Patient presents with  . Acute Visit    Anemia    HPI:  She has received her IVF; her sodium is 133. She denies any chest pain; no shortness of breath; no fatigue; no excessive weakness. There are no reports of nausea or vomiting. Her KUB does demonstrate constipation.   Past Medical History:  Diagnosis Date  . Acute gouty arthritis 11/21/2016  . Aortic atherosclerosis ([place]) 09/04/2016  . Carotid artery disease ([place]) 11/21/2016  . Carotid stenosis 12/03/2015   Advanced heavily calcified atherosclerotic disease at both carotid bifurcations. Stenoses measured at 70% affecting the right proximal ICA and 80% affecting the left proximal ICA.  Marland Kitchen Chronic kidney disease   . Coronary atherosclerosis 09/04/2016  . Diverticulitis   . DM (diabetes mellitus), type 2 ([place]) 09/04/2016  . GERD (gastroesophageal reflux disease)   . Gout   . HTN, goal below 140/90 11/21/2016  . Hyperlipidemia 06/20/2016  . Hypothyroidism, unspecified 11/21/2016  . Primary osteoarthritis 11/21/2016   involving multiple joints  . Stroke [facility])     Past Surgical History:  Procedure Laterality Date  . AMPUTATION Right 02/13/2018   Procedure: AMPUTATION RAY ( 2ND TOE );  Surgeon: [name], MD;  Location: [facility] ORS;  Service: Vascular;  Laterality: Right;  . CATARACT EXTRACTION Bilateral   . COLONOSCOPY    . Finger crystal removal     right 3rd  . LOWER EXTREMITY ANGIOGRAPHY Right 01/06/2018   Procedure: LOWER EXTREMITY ANGIOGRAPHY;  Surgeon: [name], MD;  Location: [place] [facility] LAB;  Service: Cardiovascular;  Laterality: Right;  . toe crystal removal     right 1st toe    Social History   Socioeconomic History  . Marital status: Widowed    Spouse name: Not on file  . Number of children: 0  . Years of education: 10  . Highest education  level: 10th grade  Occupational History  . Not on file  Social Needs  . Financial resource strain: Not on file  . Food insecurity:    Worry: Not on file    Inability: Not on file  . Transportation needs:    Medical: Not on file    Non-medical: Not on file  Tobacco Use  . Smoking status: Never Smoker  . Smokeless tobacco: Never Used  Substance and Sexual Activity  . Alcohol use: No  . Drug use: No  . Sexual activity: Not Currently  Lifestyle  . Physical activity:    Days per week: Not on file    Minutes per session: Not on file  . Stress: Not on file  Relationships  . Social connections:    Talks on phone: Not on file    Gets together: Not on file    Attends religious service: Not on file    Active member of club or organization: Not on file    Attends meetings of clubs or organizations: Not on file    Relationship status: Not on file  . Intimate partner violence:    Fear of current or ex partner: Not on file    Emotionally abused: Not on file    Physically abused: Not on file    Forced sexual activity: Not on file  Other Topics Concern  . Not on file  Social History Narrative   [facility]   Widowed   No children   Does not drink alcohol   Never smoker   DNR   Family History  Problem Relation Age of Onset  . Hypertension Sister   . Arthritis Sister   . Heart disease Sister   . Alcohol abuse Brother   . Stroke Brother   . Hypertension Brother   . Alcohol abuse Brother   . Stroke Brother   . Hypertension Brother   . Hypertension Brother   . Hypertension Sister       VITAL SIGNS BP (!) 110/55   Pulse 95   Temp 98.6 F (37 C)   Resp 16   Ht 5\' 2"  (1.575 m)   Wt 141 lb 1.6 oz (64 kg)   SpO2 96%   BMI 25.81 kg/m   Outpatient Encounter Medications as of 07/07/2018  Medication Sig  . acetaminophen (MAPAP ARTHRITIS PAIN) 650 MG CR tablet Take 650 mg 2 (two) times daily by mouth.   Marland Kitchen acetaminophen (TYLENOL) 325 MG  tablet Take 650 mg by mouth every 4 (four) hours as needed. for pain/ increased temp. May be administered orally, per G-tube if needed or rectally if unable to swallow (separate order). Maximum dose for 24 hours is 3,000 mg from all sources of Acetaminophen/ Tylenol  . alum & mag hydroxide-simeth (MAALOX PLUS) 400-400-40 MG/5ML suspension Take 30 mLs by mouth every 4 (four) hours as needed for indigestion.  . Amino Acids-Protein Hydrolys (FEEDING SUPPLEMENT, PRO-STAT SUGAR FREE 64,) LIQD Take 30 mLs by mouth daily. To Promote Wound Healing. Wound on right lower leg  . aspirin EC 81 MG tablet Take 1 tablet (81 mg total) by mouth daily.  . Cholecalciferol (VITAMIN D3) 2000 units capsule Take 2,000 Units by mouth daily.  . clopidogrel (PLAVIX) 75 MG tablet Take 1 tablet (75 mg total) by mouth daily.  Marland Kitchen ENSURE (ENSURE) Take 237 mLs by mouth daily.  . famotidine (PEPCID) 20 MG tablet Take 20 mg by mouth at bedtime.  . [place] (DERMACLOUD) CREA Apply liberal amount topically to area of skin irritation as needed.  OK to leave at bedside  . latanoprost (XALATAN) 0.005 % ophthalmic solution Place 1 drop into both eyes at bedtime.  Marland Kitchen levothyroxine (SYNTHROID, LEVOTHROID) 88 MCG tablet Take 88 mcg by mouth daily before breakfast.  . magnesium hydroxide (MILK OF MAGNESIA) 400 MG/5ML suspension Take 30 mLs by mouth every 4 (four) hours as needed for mild constipation. Constipation/ no BM for 2 days  . Magnesium Oxide 250 MG TABS Take 250 mg by mouth daily.  Cathie Olden (AQUACEL-AG FOAM EX) Cleanse wound with normal saline, skin prep periwound, cut Aquacel to size of wound and apply to wound base, cover with allevyn  . traZODone (DESYREL) 50 MG tablet Take 50 mg at bedtime by mouth.  Loura Pardon Salicylate (ASPERCREME) 10 % LOTN Apply thin film topically to areas of pain 3 times a day as needed  . UNABLE TO FIND Diet: regular. Low NA, NCS  . vitamin B-12 (CYANOCOBALAMIN) 500 MCG tablet Take 500 mcg by  mouth daily.   No facility-administered encounter medications on file as of 07/07/2018.      SIGNIFICANT DIAGNOSTIC EXAMS   PREVIOUS:   04-29-18: ABI:  Right: Resting right ankle-brachial index is within normal range. No evidence of significant right lower extremity arterial disease. The right toe-brachial index is abnormal. Although ankle brachial indices are within normal limits (0.95-1.29), arterial Doppler waveforms at the  ankle suggest some component of arterial occlusive disease. Left: Resting left ankle-brachial index indicates noncompressible left lower extremity arteries.The left toe-brachial index is abnormal. ABIs are unreliable.  TODAY:   07-05-18: KUB: 1. No bowel obstruction. 2 Moderate stools in colon compatible with constipation. 3. Mild osteoporosis. 4. Mild spondylosis.    LABS REVIEWED: PREVIOUS   08-13-17: wbc 3.8; hgb 10.4; hgb 31.1; mcv 95.9; plt 157; glucose 110 bun 29; creat 1.53; k+ 5.6; na++ 139; ca 8.9; liver normal albumin 3.2 chol 112; ldl 39; trig 142; hdl 45 mag 1.3; tsh 6.302; uric acid: 9.0; vit D 37.3; vit B 12: 1919 08-14-17: glucose 105; bun 32; creat 1.51; k+ 5.3; na ++ 140; ca 8.9;  09-24-17 tsh 0.747  01-01-18: wbc 7.4; hgb 10.8; hct 32.2; mcv 94.8; plt 203 tsh 2.753 01-20-18: hgb a1c 7.6 urine microalbumin 46.6  02-06-18: wbc 6.3; hgb 10.6; hct 30.8; mcv 95.1; plt 229; glucose 169; bun 33; creat 1.25; k+ 5.1; na++ 134; ca 9.3 07-04-18: wbc 8.7; hgb 7.7 ;hct 26.5; mcv 93.3; plt 207; glucose 345; bun 53; creat 1.39  k+ 5.0; na++ 133; ca 8.9 liver normal albumin 3.1  NO NEW LABS.   Review of Systems  Constitutional: Negative for malaise/fatigue.  Respiratory: Negative for cough and shortness of breath.   Cardiovascular: Negative for chest pain.  Gastrointestinal: Negative for abdominal pain, constipation and heartburn.  Musculoskeletal: Negative for back pain, joint pain and myalgias.  Skin: Negative.   Neurological: Negative for dizziness.    Psychiatric/Behavioral: The patient is not nervous/anxious.     Physical Exam Constitutional:      General: She is not in acute distress.    Appearance: She is well-developed. She is not diaphoretic.  Neck:     Thyroid: No thyromegaly.  Cardiovascular:     Rate and Rhythm: Normal rate and regular rhythm.     Heart sounds: Normal heart sounds.     Comments: Left pedal pulse: questionable presence Right pedal pulse very faint.  Pulmonary:     Effort: Pulmonary effort is normal. No respiratory distress.     Breath sounds: Normal breath sounds.  Abdominal:     General: Bowel sounds are normal. There is no distension.     Palpations: Abdomen is soft.     Tenderness: There is no abdominal tenderness.  Musculoskeletal:     Right lower leg: No edema.     Left lower leg: No edema.     Comments: Is able to move all extremities Is status post right 2nd ray amputation       Lymphadenopathy:     Cervical: No cervical adenopathy.  Skin:    General: Skin is warm and dry.  Neurological:     Mental Status: She is alert. Mental status is at baseline.  Psychiatric:        Mood and Affect: Mood normal.      ASSESSMENT/ PLAN:  TODAY:   1. Acute anemia: is without change: her labs are pending:  2. Chronic constipation: is worse: will begin miralax daily   Will continue to monitor her status; will make further changes as indicated. Due to her advanced age will try to avoid aggressive treatment.    MD is aware of resident's narcotic use and is in agreement with current plan of care. We will attempt to wean resident as apropriate   Ok Edwards NP [facility]  Contact [phone] Monday through Friday 8am- 5pm  After hours call [phone]

## 2018-07-08 DIAGNOSIS — R41841 Cognitive communication deficit: Secondary | ICD-10-CM | POA: Diagnosis not present

## 2018-07-08 DIAGNOSIS — N179 Acute kidney failure, unspecified: Secondary | ICD-10-CM | POA: Insufficient documentation

## 2018-07-08 DIAGNOSIS — I779 Disorder of arteries and arterioles, unspecified: Secondary | ICD-10-CM | POA: Diagnosis not present

## 2018-07-08 DIAGNOSIS — E1151 Type 2 diabetes mellitus with diabetic peripheral angiopathy without gangrene: Secondary | ICD-10-CM | POA: Diagnosis not present

## 2018-07-08 DIAGNOSIS — Z89421 Acquired absence of other right toe(s): Secondary | ICD-10-CM | POA: Diagnosis not present

## 2018-07-08 DIAGNOSIS — R279 Unspecified lack of coordination: Secondary | ICD-10-CM | POA: Diagnosis not present

## 2018-07-08 DIAGNOSIS — H409 Unspecified glaucoma: Secondary | ICD-10-CM | POA: Diagnosis not present

## 2018-07-09 DIAGNOSIS — R41841 Cognitive communication deficit: Secondary | ICD-10-CM | POA: Diagnosis not present

## 2018-07-09 DIAGNOSIS — H409 Unspecified glaucoma: Secondary | ICD-10-CM | POA: Diagnosis not present

## 2018-07-09 DIAGNOSIS — I779 Disorder of arteries and arterioles, unspecified: Secondary | ICD-10-CM | POA: Diagnosis not present

## 2018-07-09 DIAGNOSIS — Z89421 Acquired absence of other right toe(s): Secondary | ICD-10-CM | POA: Diagnosis not present

## 2018-07-09 DIAGNOSIS — E1151 Type 2 diabetes mellitus with diabetic peripheral angiopathy without gangrene: Secondary | ICD-10-CM | POA: Diagnosis not present

## 2018-07-09 DIAGNOSIS — R279 Unspecified lack of coordination: Secondary | ICD-10-CM | POA: Diagnosis not present

## 2018-07-10 DIAGNOSIS — Z89421 Acquired absence of other right toe(s): Secondary | ICD-10-CM | POA: Diagnosis not present

## 2018-07-10 DIAGNOSIS — R41841 Cognitive communication deficit: Secondary | ICD-10-CM | POA: Diagnosis not present

## 2018-07-10 DIAGNOSIS — H409 Unspecified glaucoma: Secondary | ICD-10-CM | POA: Diagnosis not present

## 2018-07-10 DIAGNOSIS — K5909 Other constipation: Secondary | ICD-10-CM | POA: Insufficient documentation

## 2018-07-10 DIAGNOSIS — E1151 Type 2 diabetes mellitus with diabetic peripheral angiopathy without gangrene: Secondary | ICD-10-CM | POA: Diagnosis not present

## 2018-07-10 DIAGNOSIS — R279 Unspecified lack of coordination: Secondary | ICD-10-CM | POA: Diagnosis not present

## 2018-07-10 DIAGNOSIS — I779 Disorder of arteries and arterioles, unspecified: Secondary | ICD-10-CM | POA: Diagnosis not present

## 2018-07-11 ENCOUNTER — Other Ambulatory Visit
Admission: RE | Admit: 2018-07-11 | Discharge: 2018-07-11 | Disposition: A | Discharge status: Home / Self Care | Payer: Medicare Other | Source: Skilled Nursing Facility | Attending: Adult Health | Admitting: Adult Health

## 2018-07-11 DIAGNOSIS — E538 Deficiency of other specified B group vitamins: Secondary | ICD-10-CM | POA: Insufficient documentation

## 2018-07-11 DIAGNOSIS — H409 Unspecified glaucoma: Secondary | ICD-10-CM | POA: Diagnosis not present

## 2018-07-11 DIAGNOSIS — I779 Disorder of arteries and arterioles, unspecified: Secondary | ICD-10-CM | POA: Diagnosis not present

## 2018-07-11 DIAGNOSIS — R279 Unspecified lack of coordination: Secondary | ICD-10-CM | POA: Diagnosis not present

## 2018-07-11 DIAGNOSIS — R41841 Cognitive communication deficit: Secondary | ICD-10-CM | POA: Diagnosis not present

## 2018-07-11 DIAGNOSIS — Z89421 Acquired absence of other right toe(s): Secondary | ICD-10-CM | POA: Diagnosis not present

## 2018-07-11 DIAGNOSIS — E1151 Type 2 diabetes mellitus with diabetic peripheral angiopathy without gangrene: Secondary | ICD-10-CM | POA: Diagnosis not present

## 2018-07-11 LAB — VITAMIN B12: Vitamin B-12: 886 pg/mL (ref 180–914)

## 2018-07-14 DIAGNOSIS — Z89421 Acquired absence of other right toe(s): Secondary | ICD-10-CM | POA: Diagnosis not present

## 2018-07-14 DIAGNOSIS — R279 Unspecified lack of coordination: Secondary | ICD-10-CM | POA: Diagnosis not present

## 2018-07-14 DIAGNOSIS — H409 Unspecified glaucoma: Secondary | ICD-10-CM | POA: Diagnosis not present

## 2018-07-14 DIAGNOSIS — R41841 Cognitive communication deficit: Secondary | ICD-10-CM | POA: Diagnosis not present

## 2018-07-14 DIAGNOSIS — E1151 Type 2 diabetes mellitus with diabetic peripheral angiopathy without gangrene: Secondary | ICD-10-CM | POA: Diagnosis not present

## 2018-07-14 DIAGNOSIS — I779 Disorder of arteries and arterioles, unspecified: Secondary | ICD-10-CM | POA: Diagnosis not present

## 2018-07-15 DIAGNOSIS — R41841 Cognitive communication deficit: Secondary | ICD-10-CM | POA: Diagnosis not present

## 2018-07-15 DIAGNOSIS — E1151 Type 2 diabetes mellitus with diabetic peripheral angiopathy without gangrene: Secondary | ICD-10-CM | POA: Diagnosis not present

## 2018-07-15 DIAGNOSIS — Z89421 Acquired absence of other right toe(s): Secondary | ICD-10-CM | POA: Diagnosis not present

## 2018-07-15 DIAGNOSIS — I779 Disorder of arteries and arterioles, unspecified: Secondary | ICD-10-CM | POA: Diagnosis not present

## 2018-07-15 DIAGNOSIS — H409 Unspecified glaucoma: Secondary | ICD-10-CM | POA: Diagnosis not present

## 2018-07-15 DIAGNOSIS — R279 Unspecified lack of coordination: Secondary | ICD-10-CM | POA: Diagnosis not present

## 2018-07-16 DIAGNOSIS — I779 Disorder of arteries and arterioles, unspecified: Secondary | ICD-10-CM | POA: Diagnosis not present

## 2018-07-16 DIAGNOSIS — E1151 Type 2 diabetes mellitus with diabetic peripheral angiopathy without gangrene: Secondary | ICD-10-CM | POA: Diagnosis not present

## 2018-07-16 DIAGNOSIS — R279 Unspecified lack of coordination: Secondary | ICD-10-CM | POA: Diagnosis not present

## 2018-07-16 DIAGNOSIS — R41841 Cognitive communication deficit: Secondary | ICD-10-CM | POA: Diagnosis not present

## 2018-07-16 DIAGNOSIS — H409 Unspecified glaucoma: Secondary | ICD-10-CM | POA: Diagnosis not present

## 2018-07-16 DIAGNOSIS — Z89421 Acquired absence of other right toe(s): Secondary | ICD-10-CM | POA: Diagnosis not present

## 2018-07-17 DIAGNOSIS — I779 Disorder of arteries and arterioles, unspecified: Secondary | ICD-10-CM | POA: Diagnosis not present

## 2018-07-17 DIAGNOSIS — Z89421 Acquired absence of other right toe(s): Secondary | ICD-10-CM | POA: Diagnosis not present

## 2018-07-17 DIAGNOSIS — H409 Unspecified glaucoma: Secondary | ICD-10-CM | POA: Diagnosis not present

## 2018-07-17 DIAGNOSIS — R41841 Cognitive communication deficit: Secondary | ICD-10-CM | POA: Diagnosis not present

## 2018-07-17 DIAGNOSIS — R279 Unspecified lack of coordination: Secondary | ICD-10-CM | POA: Diagnosis not present

## 2018-07-17 DIAGNOSIS — E1151 Type 2 diabetes mellitus with diabetic peripheral angiopathy without gangrene: Secondary | ICD-10-CM | POA: Diagnosis not present

## 2018-07-21 DIAGNOSIS — E1151 Type 2 diabetes mellitus with diabetic peripheral angiopathy without gangrene: Secondary | ICD-10-CM | POA: Diagnosis not present

## 2018-07-21 DIAGNOSIS — R41841 Cognitive communication deficit: Secondary | ICD-10-CM | POA: Diagnosis not present

## 2018-07-21 DIAGNOSIS — H409 Unspecified glaucoma: Secondary | ICD-10-CM | POA: Diagnosis not present

## 2018-07-21 DIAGNOSIS — R279 Unspecified lack of coordination: Secondary | ICD-10-CM | POA: Diagnosis not present

## 2018-07-21 DIAGNOSIS — I779 Disorder of arteries and arterioles, unspecified: Secondary | ICD-10-CM | POA: Diagnosis not present

## 2018-07-21 DIAGNOSIS — Z89421 Acquired absence of other right toe(s): Secondary | ICD-10-CM | POA: Diagnosis not present

## 2018-07-22 DIAGNOSIS — R279 Unspecified lack of coordination: Secondary | ICD-10-CM | POA: Diagnosis not present

## 2018-07-22 DIAGNOSIS — Z89421 Acquired absence of other right toe(s): Secondary | ICD-10-CM | POA: Diagnosis not present

## 2018-07-22 DIAGNOSIS — E1151 Type 2 diabetes mellitus with diabetic peripheral angiopathy without gangrene: Secondary | ICD-10-CM | POA: Diagnosis not present

## 2018-07-22 DIAGNOSIS — R41841 Cognitive communication deficit: Secondary | ICD-10-CM | POA: Diagnosis not present

## 2018-07-22 DIAGNOSIS — I779 Disorder of arteries and arterioles, unspecified: Secondary | ICD-10-CM | POA: Diagnosis not present

## 2018-07-22 DIAGNOSIS — H409 Unspecified glaucoma: Secondary | ICD-10-CM | POA: Diagnosis not present

## 2018-07-23 DIAGNOSIS — H409 Unspecified glaucoma: Secondary | ICD-10-CM | POA: Diagnosis not present

## 2018-07-23 DIAGNOSIS — E1151 Type 2 diabetes mellitus with diabetic peripheral angiopathy without gangrene: Secondary | ICD-10-CM | POA: Diagnosis not present

## 2018-07-23 DIAGNOSIS — Z89421 Acquired absence of other right toe(s): Secondary | ICD-10-CM | POA: Diagnosis not present

## 2018-07-23 DIAGNOSIS — R279 Unspecified lack of coordination: Secondary | ICD-10-CM | POA: Diagnosis not present

## 2018-07-23 DIAGNOSIS — R41841 Cognitive communication deficit: Secondary | ICD-10-CM | POA: Diagnosis not present

## 2018-07-23 DIAGNOSIS — I779 Disorder of arteries and arterioles, unspecified: Secondary | ICD-10-CM | POA: Diagnosis not present

## 2018-07-24 DIAGNOSIS — R279 Unspecified lack of coordination: Secondary | ICD-10-CM | POA: Diagnosis not present

## 2018-07-24 DIAGNOSIS — R41841 Cognitive communication deficit: Secondary | ICD-10-CM | POA: Diagnosis not present

## 2018-07-24 DIAGNOSIS — H409 Unspecified glaucoma: Secondary | ICD-10-CM | POA: Diagnosis not present

## 2018-07-24 DIAGNOSIS — Z89421 Acquired absence of other right toe(s): Secondary | ICD-10-CM | POA: Diagnosis not present

## 2018-07-24 DIAGNOSIS — E1151 Type 2 diabetes mellitus with diabetic peripheral angiopathy without gangrene: Secondary | ICD-10-CM | POA: Diagnosis not present

## 2018-07-24 DIAGNOSIS — I779 Disorder of arteries and arterioles, unspecified: Secondary | ICD-10-CM | POA: Diagnosis not present

## 2018-07-25 DIAGNOSIS — R41841 Cognitive communication deficit: Secondary | ICD-10-CM | POA: Diagnosis not present

## 2018-07-25 DIAGNOSIS — I779 Disorder of arteries and arterioles, unspecified: Secondary | ICD-10-CM | POA: Diagnosis not present

## 2018-07-25 DIAGNOSIS — H409 Unspecified glaucoma: Secondary | ICD-10-CM | POA: Diagnosis not present

## 2018-07-25 DIAGNOSIS — R279 Unspecified lack of coordination: Secondary | ICD-10-CM | POA: Diagnosis not present

## 2018-07-25 DIAGNOSIS — Z89421 Acquired absence of other right toe(s): Secondary | ICD-10-CM | POA: Diagnosis not present

## 2018-07-25 DIAGNOSIS — E1151 Type 2 diabetes mellitus with diabetic peripheral angiopathy without gangrene: Secondary | ICD-10-CM | POA: Diagnosis not present

## 2018-07-28 ENCOUNTER — Other Ambulatory Visit
Admission: RE | Admit: 2018-07-28 | Discharge: 2018-07-28 | Disposition: A | Discharge status: Home / Self Care | Payer: Medicare Other | Source: Skilled Nursing Facility | Attending: Adult Health | Admitting: Adult Health

## 2018-07-28 ENCOUNTER — Encounter: Payer: Self-pay | Admitting: Adult Health

## 2018-07-28 ENCOUNTER — Non-Acute Institutional Stay (SKILLED_NURSING_FACILITY): Payer: Medicare Other | Admitting: Adult Health

## 2018-07-28 DIAGNOSIS — N183 Chronic kidney disease, stage 3 unspecified: Secondary | ICD-10-CM

## 2018-07-28 DIAGNOSIS — E1122 Type 2 diabetes mellitus with diabetic chronic kidney disease: Secondary | ICD-10-CM

## 2018-07-28 DIAGNOSIS — R279 Unspecified lack of coordination: Secondary | ICD-10-CM | POA: Diagnosis not present

## 2018-07-28 DIAGNOSIS — Z7982 Long term (current) use of aspirin: Secondary | ICD-10-CM | POA: Insufficient documentation

## 2018-07-28 DIAGNOSIS — E034 Atrophy of thyroid (acquired): Secondary | ICD-10-CM

## 2018-07-28 DIAGNOSIS — D508 Other iron deficiency anemias: Secondary | ICD-10-CM

## 2018-07-28 DIAGNOSIS — E1151 Type 2 diabetes mellitus with diabetic peripheral angiopathy without gangrene: Secondary | ICD-10-CM | POA: Diagnosis not present

## 2018-07-28 DIAGNOSIS — E538 Deficiency of other specified B group vitamins: Secondary | ICD-10-CM

## 2018-07-28 DIAGNOSIS — G4709 Other insomnia: Secondary | ICD-10-CM | POA: Diagnosis not present

## 2018-07-28 DIAGNOSIS — I2510 Atherosclerotic heart disease of native coronary artery without angina pectoris: Secondary | ICD-10-CM | POA: Diagnosis not present

## 2018-07-28 DIAGNOSIS — H409 Unspecified glaucoma: Secondary | ICD-10-CM | POA: Diagnosis not present

## 2018-07-28 DIAGNOSIS — Z89421 Acquired absence of other right toe(s): Secondary | ICD-10-CM | POA: Diagnosis not present

## 2018-07-28 DIAGNOSIS — R41841 Cognitive communication deficit: Secondary | ICD-10-CM | POA: Diagnosis not present

## 2018-07-28 LAB — OCCULT BLOOD X 1 CARD TO LAB, STOOL: Fecal Occult Bld: NEGATIVE

## 2018-07-28 NOTE — Progress Notes (Addendum)
[facility]  [facility] Room Number: 345-P Place of Service:  SNF (31) Provider:  Durenda Age, NP  Patient Care Team: Kirk Ruths, MD as PCP - General (Internal Medicine) Gerlene Fee, NP as Nurse Practitioner (Geriatric Medicine)  Extended Emergency Contact Information Primary Emergency Contact: [place] Phone: [phone] Relation: Niece Secondary Emergency Contact: Charolotte Eke [place] Phone: [phone] Relation: Nephew  Code Status:  DNR  Goals of care: Advanced Directive information Advanced Directives 07/28/2018  Does Patient Have a Medical Advance Directive? Yes  Type of Advance Directive Out of facility DNR (pink MOST or yellow form);[place];Living will  Does patient want to make changes to medical advance directive? No - Patient declined  Copy of [place] in Chart? -  Pre-existing out of facility DNR order (yellow form or pink MOST form) -     Chief Complaint  Patient presents with  . Medical Management of Chronic Issues    Routine [facility] visit    HPI:  Pt is a 85 y.o. female seen today for medical management of chronic diseases.  She [facility] a [facility].  She has a PMH of CAD, CKD, type 2 DM, GERD, HTN, hypothyroidism, and stroke. BPs noted to be stable -110/55, 142/70, 137/67. CBGs noted to be elevated -208, 205, 235, 260, 186. She was seen in the room today with her sister at her bedside.    Past Medical History:  Diagnosis Date  . Acute gouty arthritis 11/21/2016  . Aortic atherosclerosis ([place]) 09/04/2016  . Carotid artery disease ([place]) 11/21/2016  . Carotid stenosis 12/03/2015   Advanced heavily calcified atherosclerotic disease at both carotid bifurcations. Stenoses measured at 70% affecting the right proximal ICA and 80% affecting the left proximal ICA.  Marland Kitchen  Chronic kidney disease   . Coronary atherosclerosis 09/04/2016  . Diverticulitis   . DM (diabetes mellitus), type 2 ([place]) 09/04/2016  . GERD (gastroesophageal reflux disease)   . Gout   . HTN, goal below 140/90 11/21/2016  . Hyperlipidemia 06/20/2016  . Hypothyroidism, unspecified 11/21/2016  . Primary osteoarthritis 11/21/2016   involving multiple joints  . Stroke [facility])    Past Surgical History:  Procedure Laterality Date  . AMPUTATION Right 02/13/2018   Procedure: AMPUTATION RAY ( 2ND TOE );  Surgeon: [name], MD;  Location: [facility] ORS;  Service: Vascular;  Laterality: Right;  . CATARACT EXTRACTION Bilateral   . COLONOSCOPY    . Finger crystal removal     right 3rd  . LOWER EXTREMITY ANGIOGRAPHY Right 01/06/2018   Procedure: LOWER EXTREMITY ANGIOGRAPHY;  Surgeon: [name], MD;  Location: [place] [facility] LAB;  Service: Cardiovascular;  Laterality: Right;  . toe crystal removal     right 1st toe    No Known Allergies  Outpatient Encounter Medications as of 07/28/2018  Medication Sig  . acetaminophen (MAPAP ARTHRITIS PAIN) 650 MG CR tablet Take 650 mg 2 (two) times daily by mouth.   Marland Kitchen acetaminophen (TYLENOL) 325 MG tablet Take 650 mg by mouth every 4 (four) hours as needed. for pain/ increased temp. May be administered orally, per G-tube if needed or rectally if unable to swallow (separate order). Maximum dose for 24 hours is 3,000 mg from all sources of Acetaminophen/ Tylenol  . alum & mag hydroxide-simeth (MAALOX PLUS) 400-400-40 MG/5ML suspension Take 30 mLs by mouth every 4 (  four) hours as needed for indigestion.  . Amino Acids-Protein Hydrolys (FEEDING SUPPLEMENT, PRO-STAT SUGAR FREE 64,) LIQD Take 30 mLs by mouth daily. To Promote Wound Healing. Wound on right lower leg  . aspirin EC 81 MG tablet Take 1 tablet (81 mg total) by mouth daily.  . Cholecalciferol (VITAMIN D3) 2000 units capsule Take 2,000 Units by mouth daily.  . clopidogrel (PLAVIX) 75 MG tablet Take 1 tablet  (75 mg total) by mouth daily.  Marland Kitchen ENSURE (ENSURE) Take 237 mLs by mouth daily.  . famotidine (PEPCID) 20 MG tablet Take 20 mg by mouth at bedtime.  . [place] (DERMACLOUD) CREA Apply liberal amount topically to area of skin irritation as needed.  OK to leave at bedside  . latanoprost (XALATAN) 0.005 % ophthalmic solution Place 1 drop into both eyes at bedtime.  Marland Kitchen levothyroxine (SYNTHROID, LEVOTHROID) 88 MCG tablet Take 88 mcg by mouth daily before breakfast.  . magnesium hydroxide (MILK OF MAGNESIA) 400 MG/5ML suspension Take 30 mLs by mouth every 4 (four) hours as needed for mild constipation. Constipation/ no BM for 2 days  . Magnesium Oxide 250 MG TABS Take 250 mg by mouth daily.  . polyethylene glycol (MIRALAX / GLYCOLAX) packet Take 17 g by mouth daily.  Cathie Olden (AQUACEL-AG FOAM EX) Cleanse wound with normal saline, skin prep periwound, cut Aquacel to size of wound and apply to wound base, cover with allevyn  . traZODone (DESYREL) 50 MG tablet Take 50 mg at bedtime by mouth.  Loura Pardon Salicylate (ASPERCREME) 10 % LOTN Apply thin film topically to areas of pain 3 times a day as needed  . UNABLE TO FIND Diet: regular. Low NA, NCS  . vitamin B-12 (CYANOCOBALAMIN) 500 MCG tablet Take 500 mcg by mouth daily.   No facility-administered encounter medications on file as of 07/28/2018.     Review of Systems  GENERAL: No change in appetite, no fatigue, no weight changes, no fever, chills or weakness MOUTH and THROAT: Denies oral discomfort, gingival pain or bleeding RESPIRATORY: no cough, SOB, DOE, wheezing, hemoptysis CARDIAC: No chest pain, edema or palpitations GI: No abdominal pain, diarrhea, constipation, heart burn, nausea or vomiting GU: Denies dysuria, frequency, hematuria, incontinence, or discharge NEUROLOGICAL: Denies dizziness, syncope, numbness, or headache PSYCHIATRIC: Denies feelings of depression or anxiety. No report of hallucinations, insomnia, paranoia, or  agitation    Immunization History  Administered Date(s) Administered  . Influenza,inj,Quad PF,6+ Mos 01/28/2015  . Influenza-Unspecified 01/27/2015, 02/14/2016, 03/05/2018  . PPD Test 12/28/2016  . Pneumococcal Polysaccharide-23 01/21/2018  . Td 07/08/2017   Pertinent  Health Maintenance Due  Topic Date Due  . FOOT EXAM  03/27/1929  . OPHTHALMOLOGY EXAM  03/27/1929  . HEMOGLOBIN A1C  07/23/2018  . URINE MICROALBUMIN  01/22/2019  . PNA vac Low Risk Adult (2 of 2 - PCV13) 01/22/2019  . INFLUENZA VACCINE  Completed  . DEXA SCAN  Discontinued   Fall Risk  09/04/2016  Falls in the past year? No     Vitals:   07/28/18 1013  BP: 140/76  Pulse: 93  Resp: 20  Temp: 98.5 F (36.9 C)  TempSrc: Oral  SpO2: 97%  Weight: 140 lb 9.6 oz (63.8 kg)  Height: 5\' 2"  (1.575 m)   Body mass index is 25.72 kg/m.  Physical Exam  GENERAL APPEARANCE: Well nourished. In no acute distress. Normal body habitus SKIN:  Left shin wound covered with aquacel dressing MOUTH and THROAT: Lips are without lesions. Oral mucosa is moist  and without lesions. RESPIRATORY: Breathing is even & unlabored, BS CTAB CARDIAC: RRR, no murmur,no extra heart sounds, no edema GI: Abdomen soft, normal BS, no masses, no tenderness EXTREMITIES:  Able to move X 4 extremities NEUROLOGICAL: There is no tremor. Speech is clear. Alert to self, disoriented to time and place. PSYCHIATRIC:  Affect and behavior are appropriate   Labs reviewed: Recent Labs    08/13/17 0700  02/06/18 1120 07/04/18 1200 07/07/18 1256  NA 139   < > 134* 133* 136  K 5.6*   < > 5.1 5.0 4.7  CL 110   < > 101 102 103  CO2 22   < > 24 21* 25  GLUCOSE 110*   < > 169* 345* 347*  BUN 29*   < > 33* 53* 40*  CREATININE 1.53*   < > 1.25* 1.39* 1.09*  CALCIUM 8.9   < > 9.3 8.9 9.0  MG 1.3*  --   --   --   --    < > = values in this interval not displayed.   Recent Labs    08/13/17 0700 07/04/18 1200 07/07/18 1256  AST 32 26 29  ALT 22 16  21   ALKPHOS 116 75 106  BILITOT 0.8 0.5 0.6  PROT 6.1* 6.1* 6.6  ALBUMIN 3.2* 3.1* 3.4*   Recent Labs    02/06/18 1120 07/04/18 1200 07/07/18 1256  WBC 6.3 8.7 5.3  NEUTROABS 3.8 6.0 3.1  HGB 10.6* 7.7* 7.8*  HCT 30.8* 26.5* 27.6*  MCV 95.1 93.3 95.8  PLT 229 207 215   Lab Results  Component Value Date   TSH 2.753 01/01/2018   Lab Results  Component Value Date   HGBA1C 7.6 (H) 01/20/2018   Lab Results  Component Value Date   CHOL 112 08/13/2017   HDL 45 08/13/2017   LDLCALC 39 08/13/2017   LDLDIRECT 58.0 09/04/2016   TRIG 142 08/13/2017   CHOLHDL 2.5 08/13/2017    Assessment/Plan   1. Type 2 diabetes mellitus with stage 3 chronic kidney disease, without long-term current use of insulin (HCC) -CBGs noted to be elevated, continue regular NCS NAS diet, will check hemoglobin A1c  2. Vitamin B12 deficiency Lab Results  Component Value Date   VITAMINB12 886 07/11/2018  -Continue cyanocobalamin 500 mcg 1 tab daily  3. Hypothyroidism due to acquired atrophy of thyroid Lab Results  Component Value Date   TSH 2.753 01/01/2018  -Continue levothyroxine 88 mcg 1 tab daily  4. Atherosclerosis of native coronary artery of native heart without angina pectoris -No complaints of chest pain, continue Plavix 75 mg 1 tab daily and aspirin EC 81 mg 1 tab daily  5. Other insomnia -Continue trazodone 50 mg 1 tab daily at bedtime  6. Iron deficiency anemia - will start FeSO4 325 mg 1 tab daily     Family/ staff Communication:  Discussed plan of care with resident.  Labs/tests ordered:  hgbA1C  Goals of care:   Long-term care.   Durenda Age, NP [facility] [phone] (Monday-Friday 8:00 a.m. - 5:00 p.m.) [phone] (after hours)

## 2018-07-29 DIAGNOSIS — Z89421 Acquired absence of other right toe(s): Secondary | ICD-10-CM | POA: Diagnosis not present

## 2018-07-29 DIAGNOSIS — R279 Unspecified lack of coordination: Secondary | ICD-10-CM | POA: Diagnosis not present

## 2018-07-29 DIAGNOSIS — E1151 Type 2 diabetes mellitus with diabetic peripheral angiopathy without gangrene: Secondary | ICD-10-CM | POA: Diagnosis not present

## 2018-07-29 DIAGNOSIS — R41841 Cognitive communication deficit: Secondary | ICD-10-CM | POA: Diagnosis not present

## 2018-07-29 DIAGNOSIS — H409 Unspecified glaucoma: Secondary | ICD-10-CM | POA: Diagnosis not present

## 2018-07-30 ENCOUNTER — Encounter: Admission: RE | Admit: 2018-07-30 | Payer: Medicare Other | Source: Ambulatory Visit | Admitting: Internal Medicine

## 2018-07-30 DIAGNOSIS — R41841 Cognitive communication deficit: Secondary | ICD-10-CM | POA: Diagnosis not present

## 2018-07-30 DIAGNOSIS — Z89421 Acquired absence of other right toe(s): Secondary | ICD-10-CM | POA: Diagnosis not present

## 2018-07-30 DIAGNOSIS — H409 Unspecified glaucoma: Secondary | ICD-10-CM | POA: Diagnosis not present

## 2018-07-30 DIAGNOSIS — R279 Unspecified lack of coordination: Secondary | ICD-10-CM | POA: Diagnosis not present

## 2018-07-30 DIAGNOSIS — E1151 Type 2 diabetes mellitus with diabetic peripheral angiopathy without gangrene: Secondary | ICD-10-CM | POA: Diagnosis not present

## 2018-07-31 DIAGNOSIS — R41841 Cognitive communication deficit: Secondary | ICD-10-CM | POA: Diagnosis not present

## 2018-07-31 DIAGNOSIS — Z89421 Acquired absence of other right toe(s): Secondary | ICD-10-CM | POA: Diagnosis not present

## 2018-07-31 DIAGNOSIS — R279 Unspecified lack of coordination: Secondary | ICD-10-CM | POA: Diagnosis not present

## 2018-07-31 DIAGNOSIS — E1151 Type 2 diabetes mellitus with diabetic peripheral angiopathy without gangrene: Secondary | ICD-10-CM | POA: Diagnosis not present

## 2018-07-31 DIAGNOSIS — H409 Unspecified glaucoma: Secondary | ICD-10-CM | POA: Diagnosis not present

## 2018-07-31 IMAGING — CT CT HEAD W/O CM
3 series · 15 of 45 positions shown, 18 images · non-contrast
Comparison: MR head 12/02/2015.  CT head 12/01/2015.

CLINICAL DATA: Generalized headache for 1-2 months. Nausea.
Imbalance. Looking for stroke.

EXAM:
CT HEAD WITHOUT CONTRAST
TECHNIQUE: Contiguous axial images were obtained from the base of the skull
through the vertex without intravenous contrast.

[Series 2: head wo · axial · 0.40mm/px · z∈[+361,+476]mm · 9 of 28 slices shown, 12 images]
[im 3/28  brain]
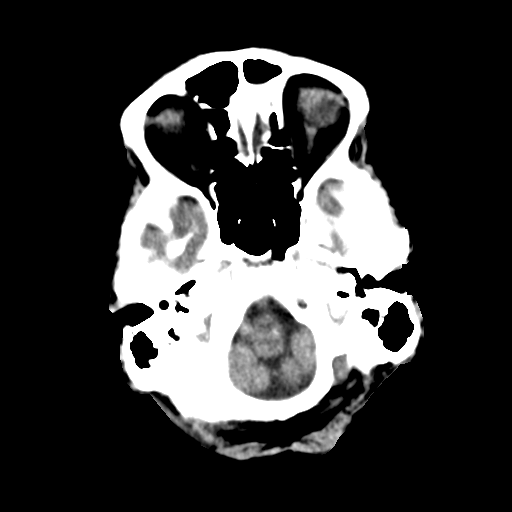
[im 3/28  bone]
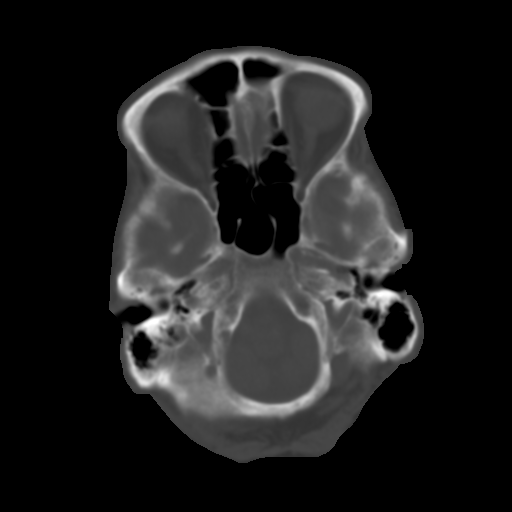
[im 6/28  brain]
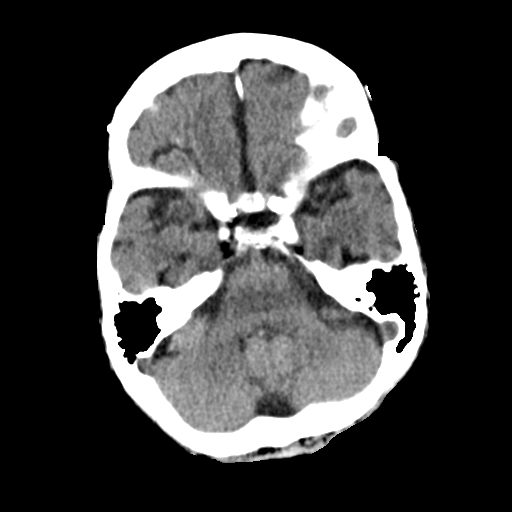
[im 9/28  brain]
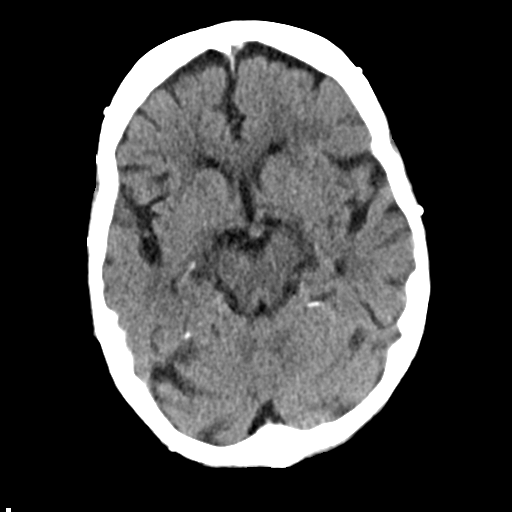
[im 12/28  brain]
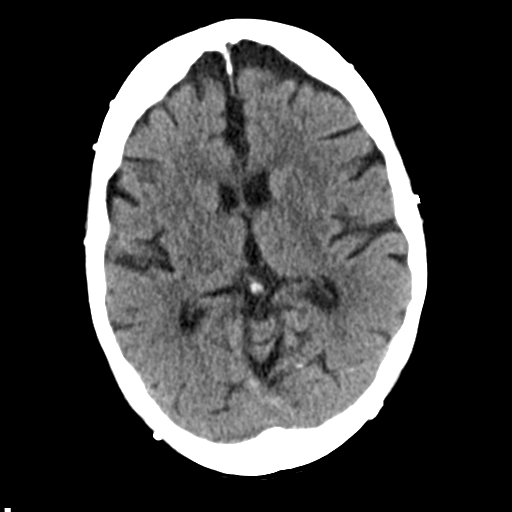
[im 15/28  brain]
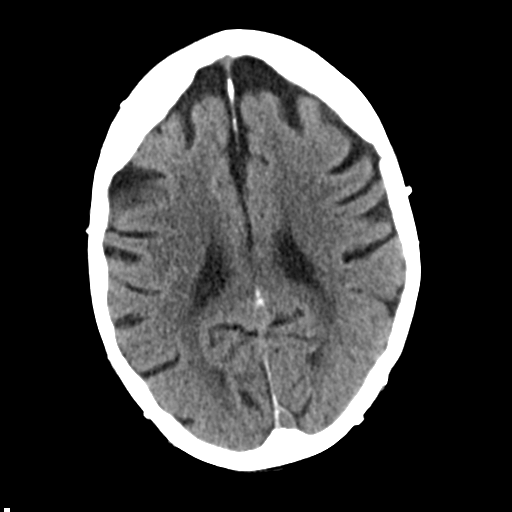
[im 15/28  bone]
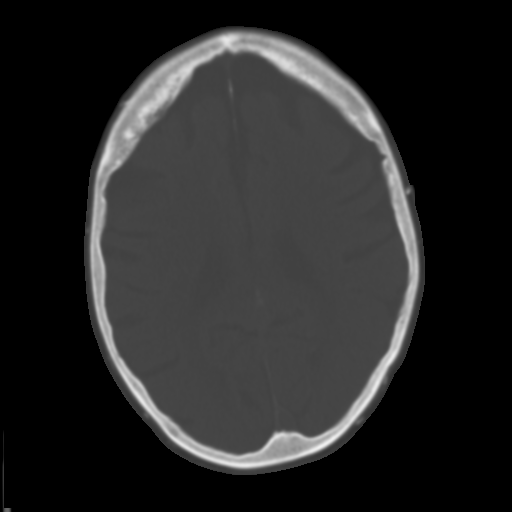
[im 17/28  brain]
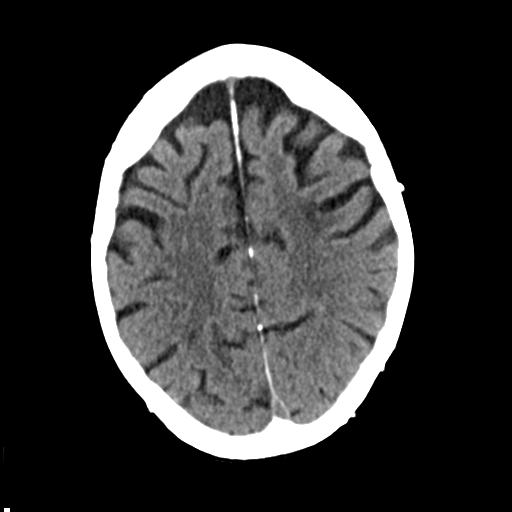
[im 20/28  brain]
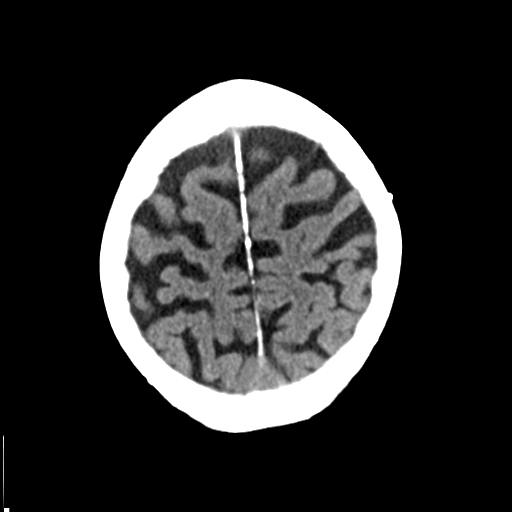
[im 23/28  brain]
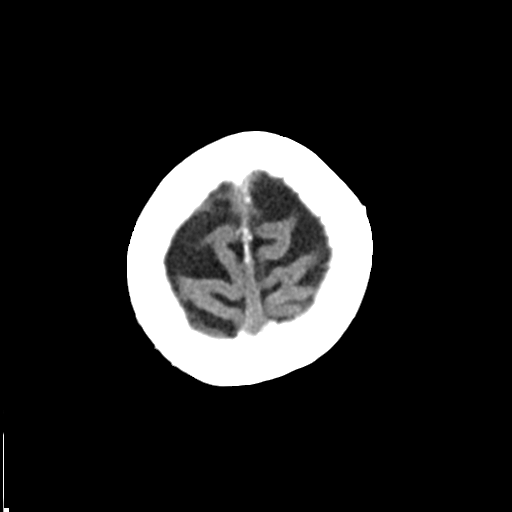
[im 26/28  brain]
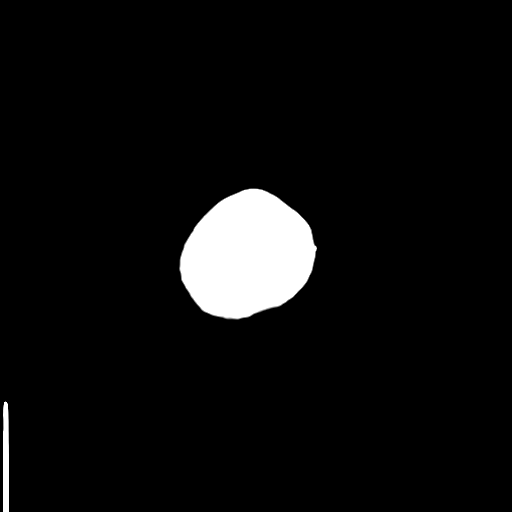
[im 26/28  bone]
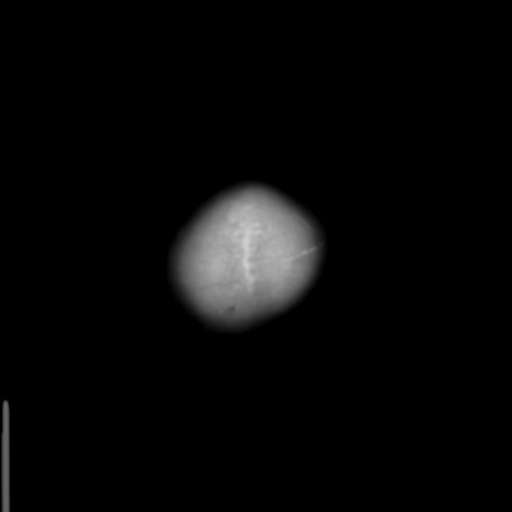

[Series 4: coronal soft tissue · coronal · 0.29mm/px · 3 of 67 slices shown]
[im 23/67  brain]
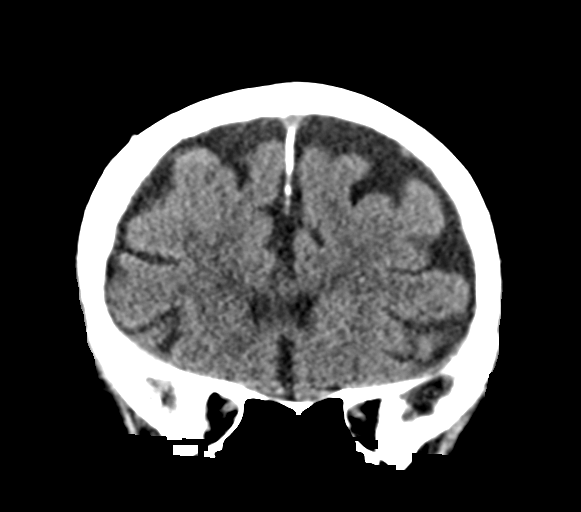
[im 30/67  brain]
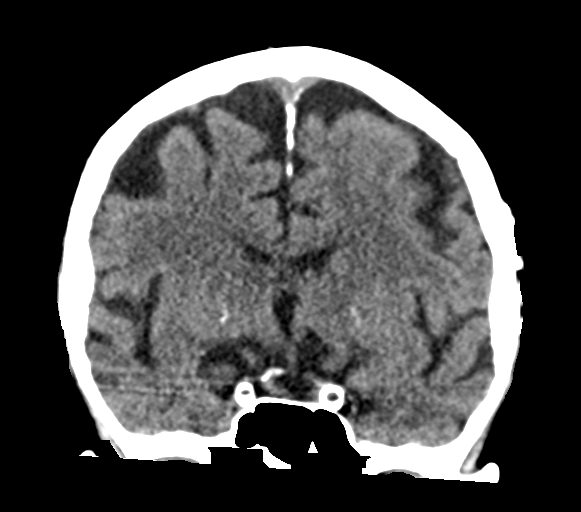
[im 37/67  brain]
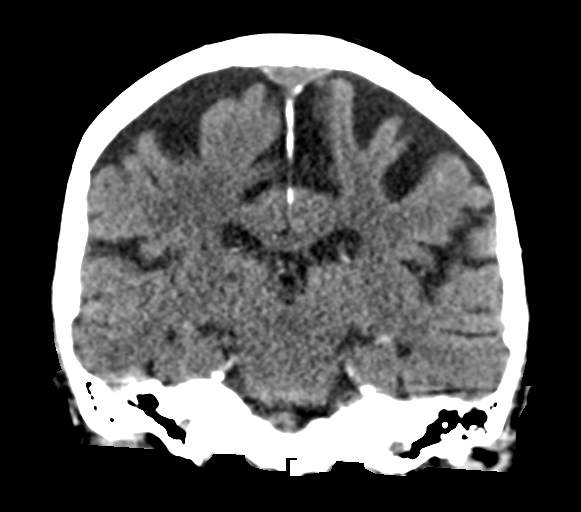

[Series 5: sagittal soft tissue · sagittal · 0.31mm/px · 3 of 48 slices shown]
[im 16/48  brain]
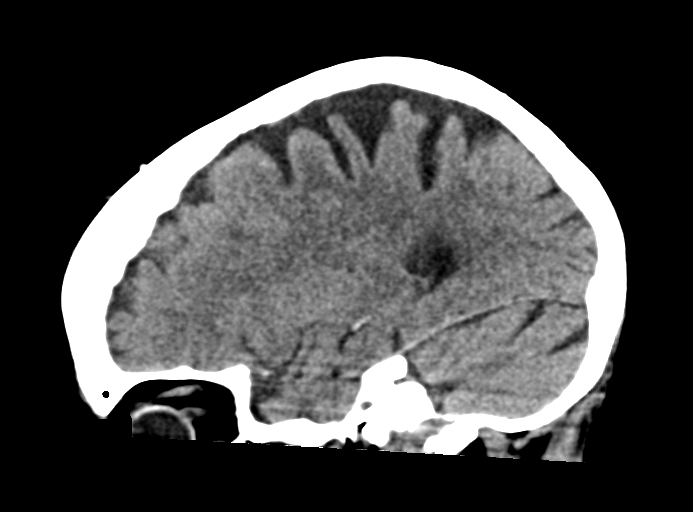
[im 24/48  brain]
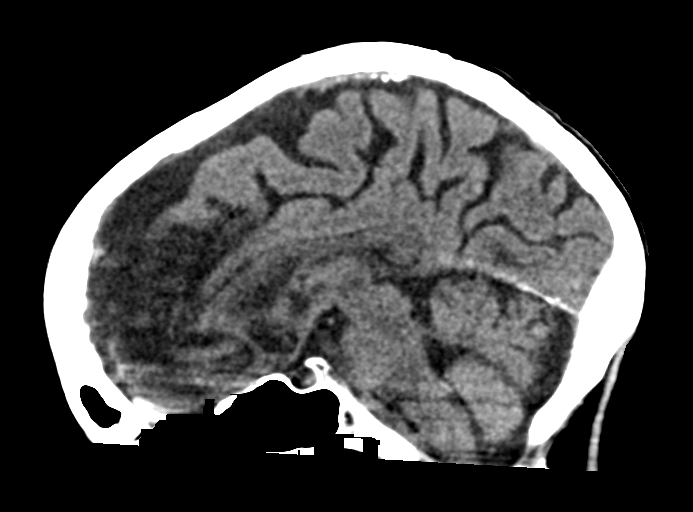
[im 32/48  brain]
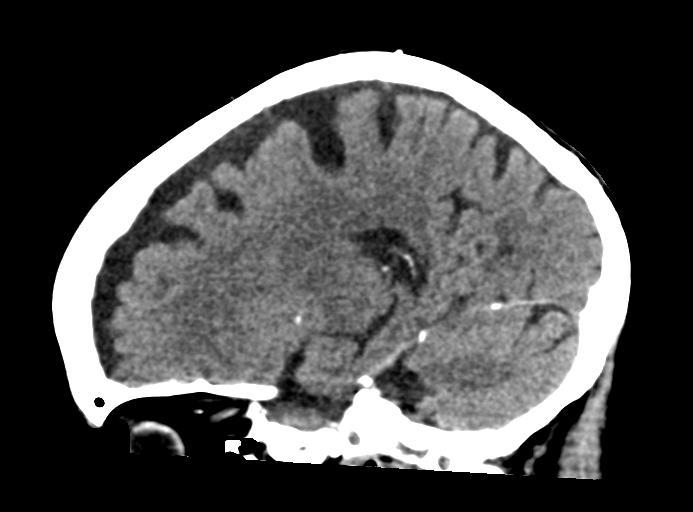

[15 of 45 positions shown; findings below may reference images not displayed]

FINDINGS: Brain: No evidence for acute infarction, hemorrhage, mass lesion,
hydrocephalus, or extra-axial fluid. Generalized atrophy, not
unexpected for age. Chronic microvascular ischemic change affecting
the periventricular and subcortical white matter.

Chronic cortical infarct, LEFT posterior frontal precentral gyrus.
This was acute in 5892.

Vascular: Vascular calcification in the carotid siphons. No signs of
large vessel occlusion.

Skull: Intact.  Negative for fracture or focal lesion.

Sinuses/Orbits: No significant paranasal sinus disease. BILATERAL
cataract extraction.

Other: None.
IMPRESSION: Atrophy and small vessel disease.  No acute intracranial findings.

Evidence for chronic LEFT hemisphere infarct.

## 2018-08-01 DIAGNOSIS — R279 Unspecified lack of coordination: Secondary | ICD-10-CM | POA: Diagnosis not present

## 2018-08-01 DIAGNOSIS — R41841 Cognitive communication deficit: Secondary | ICD-10-CM | POA: Diagnosis not present

## 2018-08-01 DIAGNOSIS — H409 Unspecified glaucoma: Secondary | ICD-10-CM | POA: Diagnosis not present

## 2018-08-01 DIAGNOSIS — E1151 Type 2 diabetes mellitus with diabetic peripheral angiopathy without gangrene: Secondary | ICD-10-CM | POA: Diagnosis not present

## 2018-08-01 DIAGNOSIS — Z89421 Acquired absence of other right toe(s): Secondary | ICD-10-CM | POA: Diagnosis not present

## 2018-08-04 ENCOUNTER — Other Ambulatory Visit
Admission: RE | Admit: 2018-08-04 | Discharge: 2018-08-04 | Disposition: A | Discharge status: Home / Self Care | Payer: Medicare Other | Source: Ambulatory Visit | Attending: Adult Health | Admitting: Adult Health

## 2018-08-04 DIAGNOSIS — Z89421 Acquired absence of other right toe(s): Secondary | ICD-10-CM | POA: Diagnosis not present

## 2018-08-04 DIAGNOSIS — R279 Unspecified lack of coordination: Secondary | ICD-10-CM | POA: Diagnosis not present

## 2018-08-04 DIAGNOSIS — E1151 Type 2 diabetes mellitus with diabetic peripheral angiopathy without gangrene: Secondary | ICD-10-CM | POA: Insufficient documentation

## 2018-08-04 DIAGNOSIS — R41841 Cognitive communication deficit: Secondary | ICD-10-CM | POA: Diagnosis not present

## 2018-08-04 DIAGNOSIS — H409 Unspecified glaucoma: Secondary | ICD-10-CM | POA: Diagnosis not present

## 2018-08-04 LAB — HEMOGLOBIN A1C
Hgb A1c MFr Bld: 8.7 % — ABNORMAL HIGH (ref 4.8–5.6)
Mean Plasma Glucose: 202.99 mg/dL

## 2018-08-05 ENCOUNTER — Non-Acute Institutional Stay (SKILLED_NURSING_FACILITY): Payer: Medicare Other | Admitting: Adult Health

## 2018-08-05 ENCOUNTER — Encounter: Payer: Self-pay | Admitting: Adult Health

## 2018-08-05 DIAGNOSIS — E1122 Type 2 diabetes mellitus with diabetic chronic kidney disease: Secondary | ICD-10-CM

## 2018-08-05 DIAGNOSIS — N183 Chronic kidney disease, stage 3 unspecified: Secondary | ICD-10-CM

## 2018-08-05 DIAGNOSIS — H409 Unspecified glaucoma: Secondary | ICD-10-CM | POA: Diagnosis not present

## 2018-08-05 DIAGNOSIS — Z89421 Acquired absence of other right toe(s): Secondary | ICD-10-CM | POA: Diagnosis not present

## 2018-08-05 DIAGNOSIS — I129 Hypertensive chronic kidney disease with stage 1 through stage 4 chronic kidney disease, or unspecified chronic kidney disease: Secondary | ICD-10-CM | POA: Diagnosis not present

## 2018-08-05 DIAGNOSIS — E1151 Type 2 diabetes mellitus with diabetic peripheral angiopathy without gangrene: Secondary | ICD-10-CM | POA: Diagnosis not present

## 2018-08-05 DIAGNOSIS — R279 Unspecified lack of coordination: Secondary | ICD-10-CM | POA: Diagnosis not present

## 2018-08-05 DIAGNOSIS — B370 Candidal stomatitis: Secondary | ICD-10-CM

## 2018-08-05 DIAGNOSIS — R41841 Cognitive communication deficit: Secondary | ICD-10-CM | POA: Diagnosis not present

## 2018-08-05 NOTE — Progress Notes (Addendum)
[facility] Room Number: 345-P Place of Service:  SNF (31) Provider:  Durenda Age, NP  Patient Care Team: Kirk Ruths, MD as PCP - General (Internal Medicine) Gerlene Fee, NP as Nurse Practitioner (Geriatric Medicine)  Extended Emergency Contact Information Primary Emergency Contact: [place] of [place] Phone: [phone] Relation: Niece Secondary Emergency Contact: Charolotte Eke [place] of [place] Phone: [phone] Relation: Nephew  Code Status:  DNR  Goals of care: Advanced Directive information Advanced Directives 08/05/2018  Does Patient Have a Medical Advance Directive? Yes  Type of Advance Directive Out of facility DNR (pink MOST or yellow form);[place];Living will  Does patient want to make changes to medical advance directive? No - Patient declined  Copy of [place] in Chart? -  Pre-existing out of facility DNR order (yellow form or pink MOST form) -     Chief Complaint  Patient presents with  . Acute Visit    Patient is seen for diabetes management.    HPI:  Pt is a 85 y.o. female seen today for an acute visit for diabetes management.  Her hemoglobin A1c was 8.7 on 08/04/2018, up from 7.6 six months ago. She is not taking any diabetic medications.  CBGs noted to be elevated -219, 196, 203, 207, 225.  BPs slightly elevated 158/73, 149/83, 131/72. Noted to have whitish coating on her tongue. She denies oral pain. She is a long-term care resident of [place]. She has a PMH of CAD, CKD, type 2 DM, GERD, HTN, hypothyroidism, and stroke.   Past Medical History:  Diagnosis Date  . Acute gouty arthritis 11/21/2016  . Aortic atherosclerosis ([place]) 09/04/2016  . Carotid artery disease ([place]) 11/21/2016  . Carotid stenosis 12/03/2015   Advanced heavily calcified atherosclerotic disease at both carotid bifurcations. Stenoses measured at 70%  affecting the right proximal ICA and 80% affecting the left proximal ICA.  Marland Kitchen Chronic kidney disease   . Coronary atherosclerosis 09/04/2016  . Diverticulitis   . DM (diabetes mellitus), type 2 ([place]) 09/04/2016  . GERD (gastroesophageal reflux disease)   . Gout   . HTN, goal below 140/90 11/21/2016  . Hyperlipidemia 06/20/2016  . Hypothyroidism, unspecified 11/21/2016  . Primary osteoarthritis 11/21/2016   involving multiple joints  . Stroke [facility])    Past Surgical History:  Procedure Laterality Date  . AMPUTATION Right 02/13/2018   Procedure: AMPUTATION RAY ( 2ND TOE );  Surgeon: [name], MD;  Location: [facility] ORS;  Service: Vascular;  Laterality: Right;  . CATARACT EXTRACTION Bilateral   . COLONOSCOPY    . Finger crystal removal     right 3rd  . LOWER EXTREMITY ANGIOGRAPHY Right 01/06/2018   Procedure: LOWER EXTREMITY ANGIOGRAPHY;  Surgeon: [name], MD;  Location: [place] CV LAB;  Service: Cardiovascular;  Laterality: Right;  . toe crystal removal     right 1st toe    No Known Allergies  Outpatient Encounter Medications as of 08/05/2018  Medication Sig  . acetaminophen (MAPAP ARTHRITIS PAIN) 650 MG CR tablet Take 650 mg 2 (two) times daily by mouth.   Marland Kitchen acetaminophen (TYLENOL) 325 MG tablet Take 650 mg by mouth every 4 (four) hours as needed. for pain/ increased temp. May be administered orally, per G-tube if needed or rectally if unable to swallow (separate order). Maximum dose for 24 hours is 3,000 mg from all sources of Acetaminophen/ Tylenol  . alum &  mag hydroxide-simeth (MAALOX PLUS) 400-400-40 MG/5ML suspension Take 30 mLs by mouth every 4 (four) hours as needed for indigestion.  . Amino Acids-Protein Hydrolys (FEEDING SUPPLEMENT, PRO-STAT SUGAR FREE 64,) LIQD Take 30 mLs by mouth daily. To Promote Wound Healing. Wound on right lower leg  . aspirin EC 81 MG tablet Take 1 tablet (81 mg total) by mouth daily.  . Cholecalciferol (VITAMIN D3) 2000 units capsule Take  2,000 Units by mouth daily.   . clopidogrel (PLAVIX) 75 MG tablet Take 1 tablet (75 mg total) by mouth daily.  Marland Kitchen ENSURE (ENSURE) Take 237 mLs by mouth daily.  . famotidine (PEPCID) 20 MG tablet Take 20 mg by mouth at bedtime.  . ferrous sulfate 325 (65 FE) MG tablet Take 325 mg by mouth daily with breakfast.  . Infant Care Products (DERMACLOUD) CREA Apply liberal amount topically to area of skin irritation as needed.  OK to leave at bedside  . latanoprost (XALATAN) 0.005 % ophthalmic solution Place 1 drop into both eyes at bedtime.  Marland Kitchen levothyroxine (SYNTHROID, LEVOTHROID) 88 MCG tablet Take 88 mcg by mouth daily before breakfast.  . magnesium hydroxide (MILK OF MAGNESIA) 400 MG/5ML suspension Take 30 mLs by mouth every 4 (four) hours as needed for mild constipation. Constipation/ no BM for 2 days  . Magnesium Oxide 250 MG TABS Take 250 mg by mouth daily.  . polyethylene glycol (MIRALAX / GLYCOLAX) packet Take 17 g by mouth daily.  Cathie Olden (AQUACEL-AG FOAM EX) Cleanse wound with normal saline, skin prep periwound, cut Aquacel to size of wound and apply to wound base, cover with allevyn  . traZODone (DESYREL) 50 MG tablet Take 50 mg at bedtime by mouth.  Loura Pardon Salicylate (ASPERCREME) 10 % LOTN Apply thin film topically to areas of pain 3 times a day as needed  . UNABLE TO FIND Diet: regular. Low NA, NCS  . vitamin B-12 (CYANOCOBALAMIN) 500 MCG tablet Take 500 mcg by mouth daily.   No facility-administered encounter medications on file as of 08/05/2018.     Review of Systems  GENERAL: No change in appetite, no fatigue, no weight changes, no fever, chills or weakness MOUTH and THROAT: Denies oral discomfort, gingival pain or bleeding RESPIRATORY: no cough, SOB, DOE, wheezing, hemoptysis CARDIAC: No chest pain, edema or palpitations GI: No abdominal pain, diarrhea, constipation, heart burn, nausea or vomiting NEUROLOGICAL: Denies dizziness, syncope, numbness, or headache PSYCHIATRIC:  Denies feelings of depression or anxiety. No report of hallucinations, insomnia, paranoia, or agitation ENDOCRINE: Denies polyphagia, polyuria, polydipsia, heat or cold intolerance   Immunization History  Administered Date(s) Administered  . Influenza,inj,Quad PF,6+ Mos 01/28/2015  . Influenza-Unspecified 01/27/2015, 02/14/2016, 03/05/2018  . PPD Test 12/28/2016  . Pneumococcal Polysaccharide-23 01/21/2018  . Td 07/08/2017   Pertinent  Health Maintenance Due  Topic Date Due  . FOOT EXAM  03/27/1929  . OPHTHALMOLOGY EXAM  03/27/1929  . URINE MICROALBUMIN  01/22/2019  . PNA vac Low Risk Adult (2 of 2 - PCV13) 01/22/2019  . HEMOGLOBIN A1C  02/04/2019  . INFLUENZA VACCINE  Completed  . DEXA SCAN  Discontinued   Fall Risk  09/04/2016  Falls in the past year? No     Vitals:   08/05/18 0842  BP: (!) 158/73  Pulse: 81  Resp: 18  Temp: 97.6 F (36.4 C)  TempSrc: Oral  SpO2: 100%  Weight: 140 lb 9.6 oz (63.8 kg)  Height: 5\' 2"  (1.575 m)   Body mass index is 25.72 kg/m.  Physical  Exam  GENERAL APPEARANCE: Well nourished. In no acute distress. Normal body habitus SKIN:  Left shin with open wound covered with Aquacel AG foam MOUTH and THROAT: Lips are without lesions. Tongue with whitish coating. RESPIRATORY: Breathing is even & unlabored, BS CTAB CARDIAC: RRR, no murmur,no extra heart sounds, no edema GI: Abdomen soft, normal BS, no masses, no tenderness EXTREMITIES:  Able to move X 4 extremities NEUROLOGICAL: There is no tremor. Speech is clear. Alert to self, disoriented to time and place. PSYCHIATRIC:  Affect and behavior are appropriate   Labs reviewed: Recent Labs    08/13/17 0700  02/06/18 1120 07/04/18 1200 07/07/18 1256  NA 139   < > 134* 133* 136  K 5.6*   < > 5.1 5.0 4.7  CL 110   < > 101 102 103  CO2 22   < > 24 21* 25  GLUCOSE 110*   < > 169* 345* 347*  BUN 29*   < > 33* 53* 40*  CREATININE 1.53*   < > 1.25* 1.39* 1.09*  CALCIUM 8.9   < > 9.3 8.9  9.0  MG 1.3*  --   --   --   --    < > = values in this interval not displayed.   Recent Labs    08/13/17 0700 07/04/18 1200 07/07/18 1256  AST 32 26 29  ALT 22 16 21   ALKPHOS 116 75 106  BILITOT 0.8 0.5 0.6  PROT 6.1* 6.1* 6.6  ALBUMIN 3.2* 3.1* 3.4*   Recent Labs    02/06/18 1120 07/04/18 1200 07/07/18 1256  WBC 6.3 8.7 5.3  NEUTROABS 3.8 6.0 3.1  HGB 10.6* 7.7* 7.8*  HCT 30.8* 26.5* 27.6*  MCV 95.1 93.3 95.8  PLT 229 207 215   Lab Results  Component Value Date   TSH 2.753 01/01/2018   Lab Results  Component Value Date   HGBA1C 8.7 (H) 08/04/2018   Lab Results  Component Value Date   CHOL 112 08/13/2017   HDL 45 08/13/2017   LDLCALC 39 08/13/2017   LDLDIRECT 58.0 09/04/2016   TRIG 142 08/13/2017   CHOLHDL 2.5 08/13/2017    Assessment/Plan  1. Type 2 diabetes mellitus with stage 3 chronic kidney disease, without long-term current use of insulin (HCC) Lab Results  Component Value Date   HGBA1C 8.7 (H) 08/04/2018  - will start Januvia 25 mg 1 tab daily, continue CBG Q D, will check urine microalbumin   2. Benign hypertension with chronic kidney disease, stage III (HCC) - will monitor BP BID for a week  3. Oral candida - will start Nystatin solution 5 ml swab tongue QID X 2 weeks, oral care daily   Family/ staff Communication: Discussed plan of care with resident.  Labs/tests ordered: urine microalbumin  Goals of care:   Long-term care.   Durenda Age, NP [facility] and Adult [facility] [phone] (Monday-Friday 8:00 a.m. - 5:00 p.m.) [phone] (after hours)

## 2018-08-06 ENCOUNTER — Other Ambulatory Visit
Admission: RE | Admit: 2018-08-06 | Discharge: 2018-08-06 | Disposition: A | Discharge status: Home / Self Care | Payer: Medicare Other | Source: Ambulatory Visit | Attending: Adult Health | Admitting: Adult Health

## 2018-08-06 DIAGNOSIS — E1122 Type 2 diabetes mellitus with diabetic chronic kidney disease: Secondary | ICD-10-CM | POA: Diagnosis not present

## 2018-08-06 DIAGNOSIS — R279 Unspecified lack of coordination: Secondary | ICD-10-CM | POA: Diagnosis not present

## 2018-08-06 DIAGNOSIS — E1151 Type 2 diabetes mellitus with diabetic peripheral angiopathy without gangrene: Secondary | ICD-10-CM | POA: Diagnosis not present

## 2018-08-06 DIAGNOSIS — R41841 Cognitive communication deficit: Secondary | ICD-10-CM | POA: Diagnosis not present

## 2018-08-06 DIAGNOSIS — H409 Unspecified glaucoma: Secondary | ICD-10-CM | POA: Diagnosis not present

## 2018-08-06 DIAGNOSIS — Z89421 Acquired absence of other right toe(s): Secondary | ICD-10-CM | POA: Diagnosis not present

## 2018-08-07 LAB — MICROALBUMIN, URINE: Microalb, Ur: 75.5 ug/mL — ABNORMAL HIGH

## 2018-08-08 ENCOUNTER — Encounter: Payer: Self-pay | Admitting: Adult Health

## 2018-08-08 ENCOUNTER — Non-Acute Institutional Stay (SKILLED_NURSING_FACILITY): Payer: Medicare Other | Admitting: Adult Health

## 2018-08-08 DIAGNOSIS — Z89421 Acquired absence of other right toe(s): Secondary | ICD-10-CM | POA: Diagnosis not present

## 2018-08-08 DIAGNOSIS — E1122 Type 2 diabetes mellitus with diabetic chronic kidney disease: Secondary | ICD-10-CM

## 2018-08-08 DIAGNOSIS — N183 Chronic kidney disease, stage 3 (moderate): Secondary | ICD-10-CM | POA: Diagnosis not present

## 2018-08-08 DIAGNOSIS — R41841 Cognitive communication deficit: Secondary | ICD-10-CM | POA: Diagnosis not present

## 2018-08-08 DIAGNOSIS — R279 Unspecified lack of coordination: Secondary | ICD-10-CM | POA: Diagnosis not present

## 2018-08-08 DIAGNOSIS — E1151 Type 2 diabetes mellitus with diabetic peripheral angiopathy without gangrene: Secondary | ICD-10-CM | POA: Diagnosis not present

## 2018-08-08 DIAGNOSIS — H409 Unspecified glaucoma: Secondary | ICD-10-CM | POA: Diagnosis not present

## 2018-08-08 DIAGNOSIS — I10 Essential (primary) hypertension: Secondary | ICD-10-CM | POA: Diagnosis not present

## 2018-08-08 NOTE — Progress Notes (Signed)
[facility]  [facility] Room Number: 345-P Place of Service:  SNF (31) Provider:  Durenda Age, NP  Patient Care Team: Kirk Ruths, MD as PCP - General (Internal Medicine) Gerlene Fee, NP as Nurse Practitioner (Geriatric Medicine)  Extended Emergency Contact Information Primary Emergency Contact: [place] of [place] Phone: [phone] Relation: Niece Secondary Emergency Contact: Charolotte Eke [place] of [place] Phone: [phone] Relation: Nephew  Code Status:  DNR  Goals of care: Advanced Directive information Advanced Directives 08/05/2018  Does Patient Have a Medical Advance Directive? Yes  Type of Advance Directive Out of facility DNR (pink MOST or yellow form);[place];Living will  Does patient want to make changes to medical advance directive? No - Patient declined  Copy of [place] in Chart? -  Pre-existing out of facility DNR order (yellow form or pink MOST form) -     Chief Complaint  Patient presents with   Acute Visit    Patient is seen for diabetes management.    HPI:  Pt is a 85 y.o. female seen today for an acute visit for diabetes management.  She is a long-term care resident at [place]. She has a PMH of CAD, CKD, type 2 DM, GERD, hypertension, hypothyroidism, and stroke. She was recently started on Januvia for diabetes mellitus. CBGs  Noted to be in the low 200s. Urine microalbumin 75.5. BPs are slightly elevated with an outlier 107/64 with the rest of BP readings -134/82, 158/76, 170/80, 158/73. No complaints of headache nor dizziness.  Past Medical History:  Diagnosis Date   Acute gouty arthritis 11/21/2016   Aortic atherosclerosis ([place]) 09/04/2016   Carotid artery disease ([place]) 11/21/2016   Carotid stenosis 12/03/2015   Advanced heavily calcified atherosclerotic disease at both carotid bifurcations. Stenoses measured at 70%  affecting the right proximal ICA and 80% affecting the left proximal ICA.   Chronic kidney disease    Coronary atherosclerosis 09/04/2016   Diverticulitis    DM (diabetes mellitus), type 2 ([place]) 09/04/2016   GERD (gastroesophageal reflux disease)    Gout    HTN, goal below 140/90 11/21/2016   Hyperlipidemia 06/20/2016   Hypothyroidism, unspecified 11/21/2016   Primary osteoarthritis 11/21/2016   involving multiple joints   Stroke [facility])    Past Surgical History:  Procedure Laterality Date   AMPUTATION Right 02/13/2018   Procedure: AMPUTATION RAY ( 2ND TOE );  Surgeon: [name], MD;  Location: [facility] ORS;  Service: Vascular;  Laterality: Right;   CATARACT EXTRACTION Bilateral    COLONOSCOPY     Finger crystal removal     right 3rd   LOWER EXTREMITY ANGIOGRAPHY Right 01/06/2018   Procedure: LOWER EXTREMITY ANGIOGRAPHY;  Surgeon: [name], MD;  Location: [place] CV LAB;  Service: Cardiovascular;  Laterality: Right;   toe crystal removal     right 1st toe    No Known Allergies  Outpatient Encounter Medications as of 08/08/2018  Medication Sig   acetaminophen (MAPAP ARTHRITIS PAIN) 650 MG CR tablet Take 650 mg 2 (two) times daily by mouth.    acetaminophen (TYLENOL) 325 MG tablet Take 650 mg by mouth every 4 (four) hours as needed. for pain/ increased temp. May be administered orally, per G-tube if needed or rectally if unable to swallow (separate order). Maximum dose for 24 hours is 3,000 mg from all sources of Acetaminophen/ Tylenol   alum & mag hydroxide-simeth (MAALOX PLUS) 400-400-40 MG/5ML suspension  Take 30 mLs by mouth every 4 (four) hours as needed for indigestion.   Amino Acids-Protein Hydrolys (FEEDING SUPPLEMENT, PRO-STAT SUGAR FREE 64,) LIQD Take 30 mLs by mouth daily. To Promote Wound Healing. Wound on right lower leg   aspirin EC 81 MG tablet Take 1 tablet (81 mg total) by mouth daily.   Cholecalciferol (VITAMIN D3) 2000 units capsule Take  2,000 Units by mouth daily.    clopidogrel (PLAVIX) 75 MG tablet Take 1 tablet (75 mg total) by mouth daily.   ENSURE (ENSURE) Take 237 mLs by mouth daily.   famotidine (PEPCID) 20 MG tablet Take 20 mg by mouth at bedtime.    ferrous sulfate 325 (65 FE) MG tablet Take 325 mg by mouth daily with breakfast.   Infant Care Products (DERMACLOUD) CREA Apply liberal amount topically to area of skin irritation as needed.  OK to leave at bedside   latanoprost (XALATAN) 0.005 % ophthalmic solution Place 1 drop into both eyes at bedtime.    levothyroxine (SYNTHROID, LEVOTHROID) 88 MCG tablet Take 88 mcg by mouth daily before breakfast.   magnesium hydroxide (MILK OF MAGNESIA) 400 MG/5ML suspension Take 30 mLs by mouth every 4 (four) hours as needed for mild constipation. Constipation/ no BM for 2 days   Magnesium Oxide 250 MG TABS Take 250 mg by mouth daily.   nystatin (MYCOSTATIN) 100000 UNIT/ML suspension Take 5 mLs by mouth 4 (four) times daily.   polyethylene glycol (MIRALAX / GLYCOLAX) packet Take 17 g by mouth daily.   Silver (AQUACEL-AG FOAM EX) Cleanse wound with normal saline, skin prep periwound, cut Aquacel to size of wound and apply to wound base, cover with allevyn   sitaGLIPtin (JANUVIA) 25 MG tablet Take 25 mg by mouth daily.   traZODone (DESYREL) 50 MG tablet Take 50 mg at bedtime by mouth.   Trolamine Salicylate (ASPERCREME) 10 % LOTN Apply thin film topically to areas of pain 3 times a day as needed   UNABLE TO FIND Diet: regular. Low NA, NCS   vitamin B-12 (CYANOCOBALAMIN) 500 MCG tablet Take 500 mcg by mouth daily.   No facility-administered encounter medications on file as of 08/08/2018.     Review of Systems  GENERAL: No change in appetite, no fatigue, no weight changes, no fever, chills or weakness MOUTH and THROAT: Denies oral discomfort, gingival pain or bleeding RESPIRATORY: no cough, SOB, DOE, wheezing, hemoptysis CARDIAC: No chest pain, edema or  palpitations GI: No abdominal pain, diarrhea, constipation, heart burn, nausea or vomiting GU: Denies dysuria, frequency, hematuria, incontinence, or discharge NEUROLOGICAL: Denies dizziness, syncope, numbness, or headache PSYCHIATRIC: Denies feelings of depression or anxiety. No report of hallucinations, insomnia, paranoia, or agitation   Immunization History  Administered Date(s) Administered   Influenza,inj,Quad PF,6+ Mos 01/28/2015   Influenza-Unspecified 01/27/2015, 02/14/2016, 03/05/2018   PPD Test 12/28/2016   Pneumococcal Polysaccharide-23 01/21/2018   Td 07/08/2017   Pertinent  Health Maintenance Due  Topic Date Due   FOOT EXAM  03/27/1929   OPHTHALMOLOGY EXAM  03/27/1929   PNA vac Low Risk Adult (2 of 2 - PCV13) 01/22/2019   HEMOGLOBIN A1C  02/04/2019   URINE MICROALBUMIN  08/06/2019   INFLUENZA VACCINE  Completed   DEXA SCAN  Discontinued   Fall Risk  09/04/2016  Falls in the past year? No     Vitals:   08/08/18 1008  BP: (!) 157/76  Pulse: 81  Resp: 18  Temp: 97.6 F (36.4 C)  TempSrc: Oral  SpO2: 100%  Weight: 140 lb 9.6 oz (63.8 kg)  Height: 5\' 2"  (1.575 m)   Body mass index is 25.72 kg/m.  Physical Exam  GENERAL APPEARANCE: Well nourished. In no acute distress. Normal body habitus SKIN: Left shin wound covered with aquacel dressing MOUTH and THROAT: Lips are without lesions. Oral mucosa is moist and without lesions. Tongue is normal in shape, size, and color and without lesions NECK: supple, trachea midline, no neck masses, no thyroid tenderness, no thyromegaly LYMPHATICS: No LAN in the neck, no supraclavicular LAN RESPIRATORY: Breathing is even & unlabored, BS CTAB CARDIAC: RRR, no murmur,no extra heart sounds, no edema GI: Abdomen soft, normal BS, no masses, no tenderness EXTREMITIES:  Able to move X 4 extremities NEUROLOGICAL: There is no tremor. Speech is clear. Alert to self, disoriented to time and place. PSYCHIATRIC:  Affect  and behavior are appropriate   Labs reviewed: Recent Labs    08/13/17 0700  02/06/18 1120 07/04/18 1200 07/07/18 1256  NA 139   < > 134* 133* 136  K 5.6*   < > 5.1 5.0 4.7  CL 110   < > 101 102 103  CO2 22   < > 24 21* 25  GLUCOSE 110*   < > 169* 345* 347*  BUN 29*   < > 33* 53* 40*  CREATININE 1.53*   < > 1.25* 1.39* 1.09*  CALCIUM 8.9   < > 9.3 8.9 9.0  MG 1.3*  --   --   --   --    < > = values in this interval not displayed.   Recent Labs    08/13/17 0700 07/04/18 1200 07/07/18 1256  AST 32 26 29  ALT 22 16 21   ALKPHOS 116 75 106  BILITOT 0.8 0.5 0.6  PROT 6.1* 6.1* 6.6  ALBUMIN 3.2* 3.1* 3.4*   Recent Labs    02/06/18 1120 07/04/18 1200 07/07/18 1256  WBC 6.3 8.7 5.3  NEUTROABS 3.8 6.0 3.1  HGB 10.6* 7.7* 7.8*  HCT 30.8* 26.5* 27.6*  MCV 95.1 93.3 95.8  PLT 229 207 215   Lab Results  Component Value Date   TSH 2.753 01/01/2018   Lab Results  Component Value Date   HGBA1C 8.7 (H) 08/04/2018   Lab Results  Component Value Date   CHOL 112 08/13/2017   HDL 45 08/13/2017   LDLCALC 39 08/13/2017   LDLDIRECT 58.0 09/04/2016   TRIG 142 08/13/2017   CHOLHDL 2.5 08/13/2017    Assessment/Plan  1. Essential hypertension - BPS are slightly elevated, will start Lisinopril 5 mg daily  2. Type 2 diabetes mellitus with stage 3 chronic kidney disease, without long-term current use of insulin (HCC) Lab Results  Component Value Date   HGBA1C 8.7 (H) 08/04/2018  - urine micoalbumin 75.5, GFR 43, creatinine 1.09, will start on Lisinopril 5 mg daily and continue Januvia 25 mg daily, CBG daily    Family/ staff Communication:Discussed plan of care with resident.  Labs/tests ordered:  None  Goals of care:   Long-term care.   Durenda Age, NP [facility] and Adult Medicine [phone] (Monday-Friday 8:00 a.m. - 5:00 p.m.) [phone] (after hours)

## 2018-08-11 DIAGNOSIS — R41841 Cognitive communication deficit: Secondary | ICD-10-CM | POA: Diagnosis not present

## 2018-08-11 DIAGNOSIS — Z89421 Acquired absence of other right toe(s): Secondary | ICD-10-CM | POA: Diagnosis not present

## 2018-08-11 DIAGNOSIS — R279 Unspecified lack of coordination: Secondary | ICD-10-CM | POA: Diagnosis not present

## 2018-08-11 DIAGNOSIS — H409 Unspecified glaucoma: Secondary | ICD-10-CM | POA: Diagnosis not present

## 2018-08-11 DIAGNOSIS — E1151 Type 2 diabetes mellitus with diabetic peripheral angiopathy without gangrene: Secondary | ICD-10-CM | POA: Diagnosis not present

## 2018-08-12 DIAGNOSIS — Z89421 Acquired absence of other right toe(s): Secondary | ICD-10-CM | POA: Diagnosis not present

## 2018-08-12 DIAGNOSIS — R279 Unspecified lack of coordination: Secondary | ICD-10-CM | POA: Diagnosis not present

## 2018-08-12 DIAGNOSIS — E1151 Type 2 diabetes mellitus with diabetic peripheral angiopathy without gangrene: Secondary | ICD-10-CM | POA: Diagnosis not present

## 2018-08-12 DIAGNOSIS — R41841 Cognitive communication deficit: Secondary | ICD-10-CM | POA: Diagnosis not present

## 2018-08-12 DIAGNOSIS — H409 Unspecified glaucoma: Secondary | ICD-10-CM | POA: Diagnosis not present

## 2018-08-13 ENCOUNTER — Non-Acute Institutional Stay (SKILLED_NURSING_FACILITY): Payer: Medicare Other | Admitting: Adult Health

## 2018-08-13 ENCOUNTER — Encounter: Payer: Self-pay | Admitting: Adult Health

## 2018-08-13 DIAGNOSIS — E1151 Type 2 diabetes mellitus with diabetic peripheral angiopathy without gangrene: Secondary | ICD-10-CM | POA: Diagnosis not present

## 2018-08-13 DIAGNOSIS — E1122 Type 2 diabetes mellitus with diabetic chronic kidney disease: Secondary | ICD-10-CM | POA: Diagnosis not present

## 2018-08-13 DIAGNOSIS — I10 Essential (primary) hypertension: Secondary | ICD-10-CM | POA: Diagnosis not present

## 2018-08-13 DIAGNOSIS — Z89421 Acquired absence of other right toe(s): Secondary | ICD-10-CM | POA: Diagnosis not present

## 2018-08-13 DIAGNOSIS — R279 Unspecified lack of coordination: Secondary | ICD-10-CM | POA: Diagnosis not present

## 2018-08-13 DIAGNOSIS — E1165 Type 2 diabetes mellitus with hyperglycemia: Secondary | ICD-10-CM

## 2018-08-13 DIAGNOSIS — K219 Gastro-esophageal reflux disease without esophagitis: Secondary | ICD-10-CM

## 2018-08-13 DIAGNOSIS — H409 Unspecified glaucoma: Secondary | ICD-10-CM | POA: Diagnosis not present

## 2018-08-13 DIAGNOSIS — IMO0002 Reserved for concepts with insufficient information to code with codable children: Secondary | ICD-10-CM

## 2018-08-13 DIAGNOSIS — R41841 Cognitive communication deficit: Secondary | ICD-10-CM | POA: Diagnosis not present

## 2018-08-13 NOTE — Progress Notes (Signed)
[facility]  [facility] Room Number: [place]:  SNF [phone]) Provider:  Durenda Age, NP  Patient Care Team: Kirk Ruths, MD as PCP - General (Internal Medicine) Gerlene Fee, NP as Nurse Practitioner (Geriatric Medicine)  Extended Emergency Contact Information Primary Emergency Contact: [place] Phone: [phone] Relation: Niece Secondary Emergency Contact: Charolotte Eke [place] of [place] Phone: [phone] Relation: Nephew  Code Status:  DNR  Goals of care: Advanced Directive information Advanced Directives 08/08/2018  Does Patient Have a Medical Advance Directive? Yes  Type of Advance Directive Out of facility DNR (pink MOST or yellow form);[place];Living will  Does patient want to make changes to medical advance directive? No - Patient declined  Copy of [employer] in Chart? Yes - validated most recent copy scanned in chart (See row information)  Pre-existing out of facility DNR order (yellow form or pink MOST form) -     Chief Complaint  Patient presents with  . Acute Visit    Patient with brown emesis.    HPI:  Pt is a 85 y.o. female seen today for an acute visit secondary to an episode of brown emesis last night. She is a long-term care resident of [place]. She has a PMH of CAD, CKD, DM2, GERD, HTN, hypothyroidism, and stroke. She was seen today in her room. Charge nurse reported that she has been complaining of abdominal pain. She used to take Protonix and was changed to Pepcid last 04/22/18. She was recently started on Lisinopril 5 mg and noted to have elevated BPs -182/85, 150/73, 137/67, 141/75. She denies headache nor dizziness. She was recently started on Januvia for DM. CBGs were noted to be still high -285, 196, 199, 201, 203, 224. She has CKD stage 3 with GFR 43.   Past Medical History:  Diagnosis Date  . Acute gouty  arthritis 11/21/2016  . Aortic atherosclerosis ([place]) 09/04/2016  . Carotid artery disease ([place]) 11/21/2016  . Carotid stenosis 12/03/2015   Advanced heavily calcified atherosclerotic disease at both carotid bifurcations. Stenoses measured at 70% affecting the right proximal ICA and 80% affecting the left proximal ICA.  Marland Kitchen Chronic kidney disease   . Coronary atherosclerosis 09/04/2016  . Diverticulitis   . DM (diabetes mellitus), type 2 ([place]) 09/04/2016  . GERD (gastroesophageal reflux disease)   . Gout   . HTN, goal below 140/90 11/21/2016  . Hyperlipidemia 06/20/2016  . Hypothyroidism, unspecified 11/21/2016  . Primary osteoarthritis 11/21/2016   involving multiple joints  . Stroke [facility])    Past Surgical History:  Procedure Laterality Date  . AMPUTATION Right 02/13/2018   Procedure: AMPUTATION RAY ( 2ND TOE );  Surgeon: [name], MD;  Location: [facility] ORS;  Service: Vascular;  Laterality: Right;  . CATARACT EXTRACTION Bilateral   . COLONOSCOPY    . Finger crystal removal     right 3rd  . LOWER EXTREMITY ANGIOGRAPHY Right 01/06/2018   Procedure: LOWER EXTREMITY ANGIOGRAPHY;  Surgeon: [name], MD;  Location: [place] CV LAB;  Service: Cardiovascular;  Laterality: Right;  . toe crystal removal     right 1st toe    No Known Allergies  Outpatient Encounter Medications as of 08/13/2018  Medication Sig  . acetaminophen (MAPAP ARTHRITIS PAIN) 650 MG CR tablet Take 650 mg 2 (two) times daily by mouth.   Marland Kitchen acetaminophen (TYLENOL) 325 MG tablet Take 650 mg by mouth every  4 (four) hours as needed. for pain/ increased temp. May be administered orally, per G-tube if needed or rectally if unable to swallow (separate order). Maximum dose for 24 hours is 3,000 mg from all sources of Acetaminophen/ Tylenol  . alum & mag hydroxide-simeth (MAALOX PLUS) 400-400-40 MG/5ML suspension Take 30 mLs by mouth every 4 (four) hours as needed for indigestion.  . Amino Acids-Protein Hydrolys (FEEDING  SUPPLEMENT, PRO-STAT SUGAR FREE 64,) LIQD Take 30 mLs by mouth daily. To Promote Wound Healing. Wound on right lower leg  . aspirin EC 81 MG tablet Take 1 tablet (81 mg total) by mouth daily.  . Cholecalciferol (VITAMIN D3) 2000 units capsule Take 2,000 Units by mouth daily.   . clopidogrel (PLAVIX) 75 MG tablet Take 1 tablet (75 mg total) by mouth daily.  Marland Kitchen ENSURE (ENSURE) Take 237 mLs by mouth daily.  . ferrous sulfate 325 (65 FE) MG tablet Take 325 mg by mouth daily with breakfast.  . Infant Care Products (DERMACLOUD) CREA Apply liberal amount topically to area of skin irritation as needed.  OK to leave at bedside  . latanoprost (XALATAN) 0.005 % ophthalmic solution Place 1 drop into both eyes at bedtime.   Marland Kitchen levothyroxine (SYNTHROID, LEVOTHROID) 88 MCG tablet Take 88 mcg by mouth daily before breakfast.  . lisinopril (PRINIVIL,ZESTRIL) 5 MG tablet Take 5 mg by mouth daily.  . magnesium hydroxide (MILK OF MAGNESIA) 400 MG/5ML suspension Take 30 mLs by mouth every 4 (four) hours as needed for mild constipation. Constipation/ no BM for 2 days  . Magnesium Oxide 250 MG TABS Take 250 mg by mouth daily.  Marland Kitchen nystatin (MYCOSTATIN) 100000 UNIT/ML suspension Take 5 mLs by mouth 4 (four) times daily.  . pantoprazole (PROTONIX) 40 MG tablet Take 40 mg by mouth daily.  . polyethylene glycol (MIRALAX / GLYCOLAX) packet Take 17 g by mouth daily.  Cathie Olden (AQUACEL-AG FOAM EX) Cleanse wound with normal saline, skin prep periwound, cut Aquacel to size of wound and apply to wound base, cover with allevyn  . sitaGLIPtin (JANUVIA) 25 MG tablet Take 25 mg by mouth daily.  . traZODone (DESYREL) 50 MG tablet Take 50 mg at bedtime by mouth.  Loura Pardon Salicylate (ASPERCREME) 10 % LOTN Apply thin film topically to areas of pain 3 times a day as needed  . UNABLE TO FIND Diet: regular. Low NA, NCS  . vitamin B-12 (CYANOCOBALAMIN) 500 MCG tablet Take 500 mcg by mouth daily.  . [DISCONTINUED] famotidine (PEPCID) 20 MG  tablet Take 20 mg by mouth at bedtime.    No facility-administered encounter medications on file as of 08/13/2018.     Review of Systems  GENERAL: No change in appetite, no fatigue, no weight changes, no fever, chills or weakness MOUTH and THROAT: Denies oral discomfort, gingival pain or bleeding RESPIRATORY: no cough, SOB, DOE, wheezing, hemoptysis CARDIAC: No chest pain, edema or palpitations GI: + abdominal pain and vomiting NEUROLOGICAL: Denies dizziness, syncope, numbness, or headache PSYCHIATRIC: Denies feelings of depression or anxiety. No report of hallucinations, insomnia, paranoia, or agitation   Immunization History  Administered Date(s) Administered  . Influenza,inj,Quad PF,6+ Mos 01/28/2015  . Influenza-Unspecified 01/27/2015, 02/14/2016, 03/05/2018  . PPD Test 12/28/2016  . Pneumococcal Polysaccharide-23 01/21/2018  . Td 07/08/2017   Pertinent  Health Maintenance Due  Topic Date Due  . FOOT EXAM  03/27/1929  . OPHTHALMOLOGY EXAM  03/27/1929  . PNA vac Low Risk Adult (2 of 2 - PCV13) 01/22/2019  . HEMOGLOBIN A1C  02/04/2019  . URINE MICROALBUMIN  08/06/2019  . INFLUENZA VACCINE  Completed  . DEXA SCAN  Discontinued   Fall Risk  09/04/2016  Falls in the past year? No     Vitals:   08/13/18 1112 08/13/18 1113  BP: (!) 168/83 (!) 182/85  Pulse: 96 81  Resp: 20   Temp: 97.7 F (36.5 C)   TempSrc: Oral   SpO2: 100%   Weight: 140 lb 9.6 oz (63.8 kg)   Height: 5\' 2"  (1.575 m)    Body mass index is 25.72 kg/m.  Physical Exam  GENERAL APPEARANCE: Well nshed. In no acute distress. Normal body habitus SKIN:  Left shin wound with dressing MOUTH and THROAT: Lips are without lesions. Oral mucosa is moist and without lesions. Tongue is normal in shape, size, and color and without lesions RESPIRATORY: Breathing is even & unlabored, BS CTAB CARDIAC: RRR, no murmur,no extra heart sounds, no edema GI: Abdomen soft, normal BS, no masses, no tenderness EXTREMITIES:   Able to move X 4 extremities NEUROLOGICAL: There is no tremor. Speech is clear. Alert to self, disoriented to time and place. PSYCHIATRIC:  Affect and behavior are appropriate  Labs reviewed: Recent Labs    02/06/18 1120 07/04/18 1200 07/07/18 1256  NA 134* 133* 136  K 5.1 5.0 4.7  CL 101 102 103  CO2 24 21* 25  GLUCOSE 169* 345* 347*  BUN 33* 53* 40*  CREATININE 1.25* 1.39* 1.09*  CALCIUM 9.3 8.9 9.0   Recent Labs    07/04/18 1200 07/07/18 1256  AST 26 29  ALT 16 21  ALKPHOS 75 106  BILITOT 0.5 0.6  PROT 6.1* 6.6  ALBUMIN 3.1* 3.4*   Recent Labs    02/06/18 1120 07/04/18 1200 07/07/18 1256  WBC 6.3 8.7 5.3  NEUTROABS 3.8 6.0 3.1  HGB 10.6* 7.7* 7.8*  HCT 30.8* 26.5* 27.6*  MCV 95.1 93.3 95.8  PLT 229 207 215   Lab Results  Component Value Date   TSH 2.753 01/01/2018   Lab Results  Component Value Date   HGBA1C 8.7 (H) 08/04/2018   Lab Results  Component Value Date   CHOL 112 08/13/2017   HDL 45 08/13/2017   LDLCALC 39 08/13/2017   LDLDIRECT 58.0 09/04/2016   TRIG 142 08/13/2017   CHOLHDL 2.5 08/13/2017    Assessment/Plan  1. Uncontrolled hypertension Lab Results  Component Value Date   HGBA1C 8.7 (H) 08/04/2018  - CBGs are elevated, 285, 196, 199, 201, 203, 224, will increase Januvia from 25 mg to 50 mg daily, continue CBG daily  2. Uncontrolled diabetes mellitus with chronic kidney disease ([place]) -BPs noted to be elevated, 182/85, 150/73, 137/67, 141/75, will increase lisinopril from 5 mg to 10 mg daily, check BP twice daily x1 week  3.  Gastroesophageal reflux disease without esophagitis -Will discontinue Pepcid and give Protonix 40 mg now and daily     Family/ staff Communication: Discussed plan of care with resident and charge nurse.  Labs/tests ordered:  CBC  Goals of care:   Long-term care.   Durenda Age, NP [facility] [phone] (Monday-Friday 8:00 a.m. - 5:00 p.m.) [phone]  (after hours)

## 2018-08-14 DIAGNOSIS — Z89421 Acquired absence of other right toe(s): Secondary | ICD-10-CM | POA: Diagnosis not present

## 2018-08-14 DIAGNOSIS — E1151 Type 2 diabetes mellitus with diabetic peripheral angiopathy without gangrene: Secondary | ICD-10-CM | POA: Diagnosis not present

## 2018-08-14 DIAGNOSIS — R279 Unspecified lack of coordination: Secondary | ICD-10-CM | POA: Diagnosis not present

## 2018-08-14 DIAGNOSIS — H409 Unspecified glaucoma: Secondary | ICD-10-CM | POA: Diagnosis not present

## 2018-08-14 DIAGNOSIS — R41841 Cognitive communication deficit: Secondary | ICD-10-CM | POA: Diagnosis not present

## 2018-08-15 DIAGNOSIS — E1151 Type 2 diabetes mellitus with diabetic peripheral angiopathy without gangrene: Secondary | ICD-10-CM | POA: Diagnosis not present

## 2018-08-15 DIAGNOSIS — H409 Unspecified glaucoma: Secondary | ICD-10-CM | POA: Diagnosis not present

## 2018-08-15 DIAGNOSIS — R41841 Cognitive communication deficit: Secondary | ICD-10-CM | POA: Diagnosis not present

## 2018-08-15 DIAGNOSIS — Z89421 Acquired absence of other right toe(s): Secondary | ICD-10-CM | POA: Diagnosis not present

## 2018-08-15 DIAGNOSIS — R279 Unspecified lack of coordination: Secondary | ICD-10-CM | POA: Diagnosis not present

## 2018-08-18 ENCOUNTER — Encounter: Payer: Self-pay | Admitting: Adult Health

## 2018-08-18 ENCOUNTER — Non-Acute Institutional Stay (SKILLED_NURSING_FACILITY): Payer: Medicare Other | Admitting: Adult Health

## 2018-08-18 DIAGNOSIS — Z89421 Acquired absence of other right toe(s): Secondary | ICD-10-CM | POA: Diagnosis not present

## 2018-08-18 DIAGNOSIS — I10 Essential (primary) hypertension: Secondary | ICD-10-CM | POA: Diagnosis not present

## 2018-08-18 DIAGNOSIS — H409 Unspecified glaucoma: Secondary | ICD-10-CM | POA: Diagnosis not present

## 2018-08-18 DIAGNOSIS — S81802S Unspecified open wound, left lower leg, sequela: Secondary | ICD-10-CM

## 2018-08-18 DIAGNOSIS — R41841 Cognitive communication deficit: Secondary | ICD-10-CM | POA: Diagnosis not present

## 2018-08-18 DIAGNOSIS — E1151 Type 2 diabetes mellitus with diabetic peripheral angiopathy without gangrene: Secondary | ICD-10-CM | POA: Diagnosis not present

## 2018-08-18 DIAGNOSIS — E1165 Type 2 diabetes mellitus with hyperglycemia: Secondary | ICD-10-CM

## 2018-08-18 DIAGNOSIS — R279 Unspecified lack of coordination: Secondary | ICD-10-CM | POA: Diagnosis not present

## 2018-08-18 NOTE — Progress Notes (Signed)
Location:  [facility] Room Number: [id number] Place of Service:  SNF (31) Provider:  Durenda Age, NP  Patient Care Team: Kirk Ruths, MD as PCP - General (Internal Medicine) Gerlene Fee, NP as Nurse Practitioner (Geriatric Medicine)  Extended Emergency Contact Information Primary Emergency Contact: [place] Phone: [phone] Relation: Niece Secondary Emergency Contact: Charolotte Eke [place] of [place] Phone: [phone] Relation: Nephew  Code Status:  DNR  Goals of care: Advanced Directive information Advanced Directives 08/13/2018  Does Patient Have a Medical Advance Directive? Yes  Type of Paramedic of [place];Out of facility DNR (pink MOST or yellow form);Living will  Does patient want to make changes to medical advance directive? No - Patient declined  Copy of [place] in Chart? Yes - validated most recent copy scanned in chart (See row information)  Pre-existing out of facility DNR order (yellow form or pink MOST form) -     Chief Complaint  Patient presents with  . Acute Visit    Patient is seen to followup on her CBGs and a nonhealing wound. Her blood pressures also continue to be elevated.    HPI:  Pt is a 85 y.o. female seen today for an acute visit as she continues to have elevated CBGs, elevated systoli-c blood pressures, and a nonhealing wound. She is a long-term care resident of [place]. She has a PMH of CAD, CKD, DM2, GERD, hypertension, hypothyroidism, and stroke. She was seen in her room today and was seen sitting on her recliner with feet elevated. She was recently started on Lisinopril and her BPs were noted to be still elevated -162/81, 180/82, 159/76, 186/84, 151/84, 144/59.  She denies having headache nor dizziness.  Her Januvia was increased from 25mg  to 50 mg on 3/19.  However, her CBGs were noted to be still elevated  -213, 186, 215, 250, 285.Marland Kitchen She has left shin non healing wound. Wound area assessed and noted area has reddish granulation with slight drainage. No infection was noted.   Past Medical History:  Diagnosis Date  . Acute gouty arthritis 11/21/2016  . Aortic atherosclerosis ([place]) 09/04/2016  . Carotid artery disease ([place]) 11/21/2016  . Carotid stenosis 12/03/2015   Advanced heavily calcified atherosclerotic disease at both carotid bifurcations. Stenoses measured at 70% affecting the right proximal ICA and 80% affecting the left proximal ICA.  Marland Kitchen Chronic kidney disease   . Coronary atherosclerosis 09/04/2016  . Diverticulitis   . DM (diabetes mellitus), type 2 ([place]) 09/04/2016  . GERD (gastroesophageal reflux disease)   . Gout   . HTN, goal below 140/90 11/21/2016  . Hyperlipidemia 06/20/2016  . Hypothyroidism, unspecified 11/21/2016  . Primary osteoarthritis 11/21/2016   involving multiple joints  . Stroke [facility])    Past Surgical History:  Procedure Laterality Date  . AMPUTATION Right 02/13/2018   Procedure: AMPUTATION RAY ( 2ND TOE );  Surgeon: [name], MD;  Location: [facility] ORS;  Service: Vascular;  Laterality: Right;  . CATARACT EXTRACTION Bilateral   . COLONOSCOPY    . Finger crystal removal     right 3rd  . LOWER EXTREMITY ANGIOGRAPHY Right 01/06/2018   Procedure: LOWER EXTREMITY ANGIOGRAPHY;  Surgeon: [name], MD;  Location: [place] CV LAB;  Service: Cardiovascular;  Laterality: Right;  . toe crystal removal     right 1st toe    No Known Allergies  Outpatient Encounter Medications as of 08/18/2018  Medication Sig  . acetaminophen (MAPAP ARTHRITIS PAIN) 650 MG CR tablet Take 650 mg 2 (two) times daily by mouth.   Marland Kitchen acetaminophen (TYLENOL) 325 MG tablet Take 650 mg by mouth every 4 (four) hours as needed. for pain/ increased temp. May be administered orally, per G-tube if needed or rectally if unable to swallow (separate order). Maximum dose for 24 hours is 3,000 mg from  all sources of Acetaminophen/ Tylenol  . alum & mag hydroxide-simeth (MAALOX PLUS) 400-400-40 MG/5ML suspension Take 30 mLs by mouth every 4 (four) hours as needed for indigestion.  . Amino Acids-Protein Hydrolys (FEEDING SUPPLEMENT, PRO-STAT SUGAR FREE 64,) LIQD Take 30 mLs by mouth daily. To Promote Wound Healing. Wound on right lower leg  . aspirin EC 81 MG tablet Take 1 tablet (81 mg total) by mouth daily.  . Cholecalciferol (VITAMIN D3) 2000 units capsule Take 2,000 Units by mouth daily.   . clopidogrel (PLAVIX) 75 MG tablet Take 1 tablet (75 mg total) by mouth daily.  Marland Kitchen ENSURE (ENSURE) Take 237 mLs by mouth daily.  . ferrous sulfate 325 (65 FE) MG tablet Take 325 mg by mouth daily with breakfast.  . Infant Care Products (DERMACLOUD) CREA Apply liberal amount topically to area of skin irritation as needed.  OK to leave at bedside  . latanoprost (XALATAN) 0.005 % ophthalmic solution Place 1 drop into both eyes at bedtime.   Marland Kitchen levothyroxine (SYNTHROID, LEVOTHROID) 88 MCG tablet Take 88 mcg by mouth daily before breakfast.  . lisinopril (PRINIVIL,ZESTRIL) 10 MG tablet Take 10 mg by mouth daily.  . magnesium hydroxide (MILK OF MAGNESIA) 400 MG/5ML suspension Take 30 mLs by mouth every 4 (four) hours as needed for mild constipation. Constipation/ no BM for 2 days  . Magnesium Oxide 250 MG TABS Take 250 mg by mouth daily.  Marland Kitchen nystatin (MYCOSTATIN) 100000 UNIT/ML suspension Take 5 mLs by mouth 4 (four) times daily.  . pantoprazole (PROTONIX) 40 MG tablet Take 40 mg by mouth daily.  . polyethylene glycol (MIRALAX / GLYCOLAX) packet Take 17 g by mouth daily.  Cathie Olden (AQUACEL-AG FOAM EX) Cleanse wound with normal saline, skin prep periwound, cut Aquacel to size of wound and apply to wound base, cover with allevyn  . sitaGLIPtin (JANUVIA) 50 MG tablet Take 50 mg by mouth daily.  . traZODone (DESYREL) 50 MG tablet Take 50 mg at bedtime by mouth.  Loura Pardon Salicylate (ASPERCREME) 10 % LOTN Apply thin  film topically to areas of pain 3 times a day as needed  . UNABLE TO FIND Diet: regular. Low NA, NCS  . vitamin B-12 (CYANOCOBALAMIN) 500 MCG tablet Take 500 mcg by mouth daily.  . [DISCONTINUED] sitaGLIPtin (JANUVIA) 25 MG tablet Take 25 mg by mouth daily.  . [DISCONTINUED] lisinopril (PRINIVIL,ZESTRIL) 5 MG tablet Take 5 mg by mouth daily.   No facility-administered encounter medications on file as of 08/18/2018.     Review of Systems  GENERAL: No change in appetite, no fatigue, no weight changes, no fever, chills or weakness MOUTH and THROAT: Denies oral discomfort, gingival pain or bleeding RESPIRATORY: no cough, SOB, DOE, wheezing, hemoptysis CARDIAC: No chest pain, edema or palpitations GI: No abdominal pain, diarrhea, constipation, heart burn, nausea or vomiting GU: Denies dysuria, frequency, hematuria, incontinence, or discharge NEUROLOGICAL: Denies dizziness, syncope, numbness, or headache PSYCHIATRIC: Denies feelings of depression or anxiety. No report of hallucinations, insomnia, paranoia, or agitation   Immunization History  Administered Date(s) Administered  . Influenza,inj,Quad PF,6+ Mos 01/28/2015  .  Influenza-Unspecified 01/27/2015, 02/14/2016, 03/05/2018  . PPD Test 12/28/2016  . Pneumococcal Polysaccharide-23 01/21/2018  . Td 07/08/2017   Pertinent  Health Maintenance Due  Topic Date Due  . FOOT EXAM  03/27/1929  . OPHTHALMOLOGY EXAM  03/27/1929  . PNA vac Low Risk Adult (2 of 2 - PCV13) 01/22/2019  . HEMOGLOBIN A1C  02/04/2019  . INFLUENZA VACCINE  Completed  . DEXA SCAN  Discontinued   Fall Risk  09/04/2016  Falls in the past year? No     Vitals:   08/18/18 0859  BP: (!) 162/81  Pulse: 80  Resp: 20  Temp: (!) 97.4 F (36.3 C)  TempSrc: Oral  SpO2: 100%  Weight: 140 lb 9.6 oz (63.8 kg)  Height: 5\' 2"  (1.575 m)   Body mass index is 25.72 kg/m.  Physical Exam  GENERAL APPEARANCE: Well nourished. In no acute distress. Normal body habitus  SKIN:  See HPI MOUTH and THROAT: Lips are without lesions. Oral mucosa is moist and without lesions. Tongue is normal in shape, size, and color and without lesions RESPIRATORY: Breathing is even & unlabored, BS CTAB CARDIAC: RRR, no murmur,no extra heart sounds, no edema GI: Abdomen soft, normal BS, no masses, no tenderness EXTREMITIES:  Able to move X 4 extremities NEUROLOGICAL: There is no tremor. Speech is clear. Alert to self, disoriented to time and place. PSYCHIATRIC:  Affect and behavior are appropriate   Labs reviewed: Recent Labs    02/06/18 1120 07/04/18 1200 07/07/18 1256  NA 134* 133* 136  K 5.1 5.0 4.7  CL 101 102 103  CO2 24 21* 25  GLUCOSE 169* 345* 347*  BUN 33* 53* 40*  CREATININE 1.25* 1.39* 1.09*  CALCIUM 9.3 8.9 9.0   Recent Labs    07/04/18 1200 07/07/18 1256  AST 26 29  ALT 16 21  ALKPHOS 75 106  BILITOT 0.5 0.6  PROT 6.1* 6.6  ALBUMIN 3.1* 3.4*   Recent Labs    02/06/18 1120 07/04/18 1200 07/07/18 1256  WBC 6.3 8.7 5.3  NEUTROABS 3.8 6.0 3.1  HGB 10.6* 7.7* 7.8*  HCT 30.8* 26.5* 27.6*  MCV 95.1 93.3 95.8  PLT 229 207 215   Lab Results  Component Value Date   TSH 2.753 01/01/2018   Lab Results  Component Value Date   HGBA1C 8.7 (H) 08/04/2018   Lab Results  Component Value Date   CHOL 112 08/13/2017   HDL 45 08/13/2017   LDLCALC 39 08/13/2017   LDLDIRECT 58.0 09/04/2016   TRIG 142 08/13/2017   CHOLHDL 2.5 08/13/2017    Assessment/Plan  1. Uncontrolled hypertension - BPs noted to be elevated, will increase Lisinopril to 20 mg daily, BP BID X 1 week  2. Uncontrolled type 2 diabetes mellitus with hyperglycemia (HCC) - CBGs are elevated, will start Lantus 100 units/ml inject 5 units SQ Q HS and continue Januvia 50 mg daily,CBG BID  3. Non-healing wound of lower extremity, left, sequela - no infection noted, will continue treatment daily with Aquacel AG   Family/ staff Communication: Discussed plan of care with resident.   Labs/tests ordered:  None  Goals of care:   Long-term care.   Durenda Age, NP [facility] [phone] (Monday-Friday 8:00 a.m. - 5:00 p.m.) [phone] (after hours)

## 2018-08-20 DIAGNOSIS — E1151 Type 2 diabetes mellitus with diabetic peripheral angiopathy without gangrene: Secondary | ICD-10-CM | POA: Diagnosis not present

## 2018-08-20 DIAGNOSIS — H409 Unspecified glaucoma: Secondary | ICD-10-CM | POA: Diagnosis not present

## 2018-08-20 DIAGNOSIS — Z89421 Acquired absence of other right toe(s): Secondary | ICD-10-CM | POA: Diagnosis not present

## 2018-08-20 DIAGNOSIS — R279 Unspecified lack of coordination: Secondary | ICD-10-CM | POA: Diagnosis not present

## 2018-08-20 DIAGNOSIS — R41841 Cognitive communication deficit: Secondary | ICD-10-CM | POA: Diagnosis not present

## 2018-08-22 DIAGNOSIS — I739 Peripheral vascular disease, unspecified: Secondary | ICD-10-CM | POA: Diagnosis not present

## 2018-08-22 DIAGNOSIS — I70235 Atherosclerosis of native arteries of right leg with ulceration of other part of foot: Secondary | ICD-10-CM | POA: Diagnosis not present

## 2018-08-22 DIAGNOSIS — Z89421 Acquired absence of other right toe(s): Secondary | ICD-10-CM | POA: Diagnosis not present

## 2018-08-22 DIAGNOSIS — H409 Unspecified glaucoma: Secondary | ICD-10-CM | POA: Diagnosis not present

## 2018-08-22 DIAGNOSIS — E1151 Type 2 diabetes mellitus with diabetic peripheral angiopathy without gangrene: Secondary | ICD-10-CM | POA: Diagnosis not present

## 2018-08-22 DIAGNOSIS — M150 Primary generalized (osteo)arthritis: Secondary | ICD-10-CM | POA: Diagnosis not present

## 2018-08-22 DIAGNOSIS — Z789 Other specified health status: Secondary | ICD-10-CM | POA: Diagnosis not present

## 2018-08-22 DIAGNOSIS — R41841 Cognitive communication deficit: Secondary | ICD-10-CM | POA: Diagnosis not present

## 2018-08-22 DIAGNOSIS — E034 Atrophy of thyroid (acquired): Secondary | ICD-10-CM | POA: Diagnosis not present

## 2018-08-22 DIAGNOSIS — I10 Essential (primary) hypertension: Secondary | ICD-10-CM | POA: Diagnosis not present

## 2018-08-22 DIAGNOSIS — R279 Unspecified lack of coordination: Secondary | ICD-10-CM | POA: Diagnosis not present

## 2018-08-25 DIAGNOSIS — E1151 Type 2 diabetes mellitus with diabetic peripheral angiopathy without gangrene: Secondary | ICD-10-CM | POA: Diagnosis not present

## 2018-08-25 DIAGNOSIS — H409 Unspecified glaucoma: Secondary | ICD-10-CM | POA: Diagnosis not present

## 2018-08-25 DIAGNOSIS — Z89421 Acquired absence of other right toe(s): Secondary | ICD-10-CM | POA: Diagnosis not present

## 2018-08-25 DIAGNOSIS — R41841 Cognitive communication deficit: Secondary | ICD-10-CM | POA: Diagnosis not present

## 2018-08-25 DIAGNOSIS — R279 Unspecified lack of coordination: Secondary | ICD-10-CM | POA: Diagnosis not present

## 2018-08-26 DIAGNOSIS — Z89421 Acquired absence of other right toe(s): Secondary | ICD-10-CM | POA: Diagnosis not present

## 2018-08-26 DIAGNOSIS — R279 Unspecified lack of coordination: Secondary | ICD-10-CM | POA: Diagnosis not present

## 2018-08-26 DIAGNOSIS — E1151 Type 2 diabetes mellitus with diabetic peripheral angiopathy without gangrene: Secondary | ICD-10-CM | POA: Diagnosis not present

## 2018-08-26 DIAGNOSIS — H409 Unspecified glaucoma: Secondary | ICD-10-CM | POA: Diagnosis not present

## 2018-08-26 DIAGNOSIS — R41841 Cognitive communication deficit: Secondary | ICD-10-CM | POA: Diagnosis not present

## 2018-08-27 ENCOUNTER — Encounter
Admission: RE | Admit: 2018-08-27 | Discharge: 2018-08-27 | Disposition: A | Discharge status: Home / Self Care | Payer: Medicare Other | Source: Ambulatory Visit | Attending: Internal Medicine | Admitting: Internal Medicine

## 2018-08-27 DIAGNOSIS — E1151 Type 2 diabetes mellitus with diabetic peripheral angiopathy without gangrene: Secondary | ICD-10-CM | POA: Diagnosis not present

## 2018-08-27 DIAGNOSIS — H409 Unspecified glaucoma: Secondary | ICD-10-CM | POA: Diagnosis not present

## 2018-08-27 DIAGNOSIS — Z89421 Acquired absence of other right toe(s): Secondary | ICD-10-CM | POA: Diagnosis not present

## 2018-08-27 DIAGNOSIS — R41841 Cognitive communication deficit: Secondary | ICD-10-CM | POA: Diagnosis not present

## 2018-08-27 DIAGNOSIS — R279 Unspecified lack of coordination: Secondary | ICD-10-CM | POA: Diagnosis not present

## 2018-08-28 DIAGNOSIS — E1151 Type 2 diabetes mellitus with diabetic peripheral angiopathy without gangrene: Secondary | ICD-10-CM | POA: Diagnosis not present

## 2018-08-28 DIAGNOSIS — R279 Unspecified lack of coordination: Secondary | ICD-10-CM | POA: Diagnosis not present

## 2018-08-28 DIAGNOSIS — R41841 Cognitive communication deficit: Secondary | ICD-10-CM | POA: Diagnosis not present

## 2018-08-28 DIAGNOSIS — H409 Unspecified glaucoma: Secondary | ICD-10-CM | POA: Diagnosis not present

## 2018-08-28 DIAGNOSIS — Z89421 Acquired absence of other right toe(s): Secondary | ICD-10-CM | POA: Diagnosis not present

## 2018-08-29 DIAGNOSIS — R41841 Cognitive communication deficit: Secondary | ICD-10-CM | POA: Diagnosis not present

## 2018-08-29 DIAGNOSIS — E1151 Type 2 diabetes mellitus with diabetic peripheral angiopathy without gangrene: Secondary | ICD-10-CM | POA: Diagnosis not present

## 2018-08-29 DIAGNOSIS — H409 Unspecified glaucoma: Secondary | ICD-10-CM | POA: Diagnosis not present

## 2018-08-29 DIAGNOSIS — Z89421 Acquired absence of other right toe(s): Secondary | ICD-10-CM | POA: Diagnosis not present

## 2018-08-29 DIAGNOSIS — R279 Unspecified lack of coordination: Secondary | ICD-10-CM | POA: Diagnosis not present

## 2018-09-01 DIAGNOSIS — H409 Unspecified glaucoma: Secondary | ICD-10-CM | POA: Diagnosis not present

## 2018-09-01 DIAGNOSIS — R41841 Cognitive communication deficit: Secondary | ICD-10-CM | POA: Diagnosis not present

## 2018-09-01 DIAGNOSIS — E1151 Type 2 diabetes mellitus with diabetic peripheral angiopathy without gangrene: Secondary | ICD-10-CM | POA: Diagnosis not present

## 2018-09-01 DIAGNOSIS — R279 Unspecified lack of coordination: Secondary | ICD-10-CM | POA: Diagnosis not present

## 2018-09-01 DIAGNOSIS — Z89421 Acquired absence of other right toe(s): Secondary | ICD-10-CM | POA: Diagnosis not present

## 2018-09-03 DIAGNOSIS — R41841 Cognitive communication deficit: Secondary | ICD-10-CM | POA: Diagnosis not present

## 2018-09-03 DIAGNOSIS — H409 Unspecified glaucoma: Secondary | ICD-10-CM | POA: Diagnosis not present

## 2018-09-03 DIAGNOSIS — Z89421 Acquired absence of other right toe(s): Secondary | ICD-10-CM | POA: Diagnosis not present

## 2018-09-03 DIAGNOSIS — R279 Unspecified lack of coordination: Secondary | ICD-10-CM | POA: Diagnosis not present

## 2018-09-03 DIAGNOSIS — E1151 Type 2 diabetes mellitus with diabetic peripheral angiopathy without gangrene: Secondary | ICD-10-CM | POA: Diagnosis not present

## 2018-09-04 ENCOUNTER — Non-Acute Institutional Stay (SKILLED_NURSING_FACILITY): Payer: Medicare Other | Admitting: Adult Health

## 2018-09-04 ENCOUNTER — Encounter: Payer: Self-pay | Admitting: Adult Health

## 2018-09-04 DIAGNOSIS — Z89421 Acquired absence of other right toe(s): Secondary | ICD-10-CM | POA: Diagnosis not present

## 2018-09-04 DIAGNOSIS — I10 Essential (primary) hypertension: Secondary | ICD-10-CM | POA: Diagnosis not present

## 2018-09-04 DIAGNOSIS — E538 Deficiency of other specified B group vitamins: Secondary | ICD-10-CM | POA: Diagnosis not present

## 2018-09-04 DIAGNOSIS — E034 Atrophy of thyroid (acquired): Secondary | ICD-10-CM | POA: Diagnosis not present

## 2018-09-04 DIAGNOSIS — E1122 Type 2 diabetes mellitus with diabetic chronic kidney disease: Secondary | ICD-10-CM

## 2018-09-04 DIAGNOSIS — R41841 Cognitive communication deficit: Secondary | ICD-10-CM | POA: Diagnosis not present

## 2018-09-04 DIAGNOSIS — N183 Chronic kidney disease, stage 3 (moderate): Secondary | ICD-10-CM

## 2018-09-04 DIAGNOSIS — D509 Iron deficiency anemia, unspecified: Secondary | ICD-10-CM

## 2018-09-04 DIAGNOSIS — Z8673 Personal history of transient ischemic attack (TIA), and cerebral infarction without residual deficits: Secondary | ICD-10-CM | POA: Diagnosis not present

## 2018-09-04 DIAGNOSIS — H409 Unspecified glaucoma: Secondary | ICD-10-CM | POA: Diagnosis not present

## 2018-09-04 DIAGNOSIS — R279 Unspecified lack of coordination: Secondary | ICD-10-CM | POA: Diagnosis not present

## 2018-09-04 DIAGNOSIS — E1151 Type 2 diabetes mellitus with diabetic peripheral angiopathy without gangrene: Secondary | ICD-10-CM | POA: Diagnosis not present

## 2018-09-04 NOTE — Progress Notes (Signed)
[facility]  [facility] Room Number: 382N Place of Service:  SNF [phone] Provider:  Durenda Age, NP  Patient Care Team: Kirk Ruths, MD as PCP - General (Internal Medicine) Gerlene Fee, NP as Nurse Practitioner (Geriatric Medicine)  Extended Emergency Contact Information Primary Emergency Contact: [place] Phone: [phone] Relation: Niece Secondary Emergency Contact: Charolotte Eke [place] of [place] Phone: [phone] Relation: Nephew  Code Status:  DNR  Goals of care: Advanced Directive information Advanced Directives 09/04/2018  Does Patient Have a Medical Advance Directive? Yes  Type of Paramedic of [place];Living will;Out of facility DNR (pink MOST or yellow form)  Does patient want to make changes to medical advance directive? No - Patient declined  Copy of [place] in Chart? Yes - validated most recent copy scanned in chart (See row information)  Pre-existing out of facility DNR order (yellow form or pink MOST form) Yellow form placed in chart (order not valid for inpatient use)     Chief Complaint  Patient presents with  . Medical Management of Chronic Issues    Routine Visit    HPI:  Pt is a 85 y.o. female seen today for medical management of chronic diseases. She has PMH of CAD, CKD stage 3, Gout, DM and hypertension. She was seen in her room today. Review of vital signs noted that her BPs are elevated.  She is currently on lisinopril 20 mg 1 tab daily.  She denies having any headache nor dizziness.  CBGs noted to be elevated -149, 198, 163, 198, 286, 255, 283. She was started on 5 units of Lantus 2 1/2 weeks ago.   Past Medical History:  Diagnosis Date  . Acute gouty arthritis 11/21/2016  . Aortic atherosclerosis ([place]) 09/04/2016  . Carotid artery disease ([place]) 11/21/2016  . Carotid stenosis 12/03/2015   Advanced heavily  calcified atherosclerotic disease at both carotid bifurcations. Stenoses measured at 70% affecting the right proximal ICA and 80% affecting the left proximal ICA.  Marland Kitchen Chronic kidney disease   . Coronary atherosclerosis 09/04/2016  . Diverticulitis   . DM (diabetes mellitus), type 2 ([place]) 09/04/2016  . GERD (gastroesophageal reflux disease)   . Gout   . HTN, goal below 140/90 11/21/2016  . Hyperlipidemia 06/20/2016  . Hypothyroidism, unspecified 11/21/2016  . Primary osteoarthritis 11/21/2016   involving multiple joints  . Stroke [facility])    Past Surgical History:  Procedure Laterality Date  . AMPUTATION Right 02/13/2018   Procedure: AMPUTATION RAY ( 2ND TOE );  Surgeon: [name], MD;  Location: [facility] ORS;  Service: Vascular;  Laterality: Right;  . CATARACT EXTRACTION Bilateral   . COLONOSCOPY    . Finger crystal removal     right 3rd  . LOWER EXTREMITY ANGIOGRAPHY Right 01/06/2018   Procedure: LOWER EXTREMITY ANGIOGRAPHY;  Surgeon: [name], MD;  Location: [place] CV LAB;  Service: Cardiovascular;  Laterality: Right;  . toe crystal removal     right 1st toe    No Known Allergies  Outpatient Encounter Medications as of 09/04/2018  Medication Sig  . acetaminophen (MAPAP ARTHRITIS PAIN) 650 MG CR tablet Take 650 mg 2 (two) times daily by mouth.   Marland Kitchen acetaminophen (TYLENOL) 325 MG tablet Take 650 mg by mouth every 4 (four) hours as needed. for pain/ increased temp. May be administered orally, per G-tube if needed or rectally if unable to swallow (separate order). Maximum  dose for 24 hours is 3,000 mg from all sources of Acetaminophen/ Tylenol  . alum & mag hydroxide-simeth (MAALOX PLUS) 400-400-40 MG/5ML suspension Take 30 mLs by mouth every 4 (four) hours as needed for indigestion.  . Amino Acids-Protein Hydrolys (FEEDING SUPPLEMENT, PRO-STAT SUGAR FREE 64,) LIQD Take 30 mLs by mouth daily. To Promote Wound Healing. Wound on right lower leg  . aspirin EC 81 MG tablet Take 1 tablet  (81 mg total) by mouth daily.  . Cholecalciferol (VITAMIN D3) 2000 units capsule Take 2,000 Units by mouth daily.   . clopidogrel (PLAVIX) 75 MG tablet Take 1 tablet (75 mg total) by mouth daily.  Marland Kitchen ENSURE (ENSURE) Take 237 mLs by mouth daily.  . ferrous sulfate 325 (65 FE) MG tablet Take 325 mg by mouth daily with breakfast.  . Infant Care Products (DERMACLOUD) CREA Apply liberal amount topically to area of skin irritation as needed.  OK to leave at bedside  . Insulin Glargine (LANTUS SOLOSTAR) 100 UNIT/ML Solostar Pen Inject 5 Units into the skin at bedtime.  Marland Kitchen latanoprost (XALATAN) 0.005 % ophthalmic solution Place 1 drop into both eyes at bedtime.   Marland Kitchen levothyroxine (SYNTHROID, LEVOTHROID) 88 MCG tablet Take 88 mcg by mouth daily before breakfast.  . lisinopril (PRINIVIL,ZESTRIL) 20 MG tablet Take 20 mg by mouth daily.  . magnesium hydroxide (MILK OF MAGNESIA) 400 MG/5ML suspension Take 30 mLs by mouth every 4 (four) hours as needed for mild constipation. Constipation/ no BM for 2 days  . Magnesium Oxide 250 MG TABS Take 250 mg by mouth daily.  . pantoprazole (PROTONIX) 40 MG tablet Take 40 mg by mouth daily.  . polyethylene glycol (MIRALAX / GLYCOLAX) packet Take 17 g by mouth daily.  Cathie Olden (AQUACEL-AG FOAM EX) Cleanse wound with normal saline, skin prep periwound, cut Aquacel to size of wound and apply to wound base, cover with allevyn  . sitaGLIPtin (JANUVIA) 50 MG tablet Take 50 mg by mouth daily.  . traZODone (DESYREL) 50 MG tablet Take 50 mg at bedtime by mouth.  Loura Pardon Salicylate (ASPERCREME) 10 % LOTN Apply thin film topically to areas of pain 3 times a day as needed  . UNABLE TO FIND Diet: regular. Low NA, NCS  . vitamin B-12 (CYANOCOBALAMIN) 500 MCG tablet Take 500 mcg by mouth daily.  . [DISCONTINUED] lisinopril (PRINIVIL,ZESTRIL) 10 MG tablet Take 10 mg by mouth daily.   No facility-administered encounter medications on file as of 09/04/2018.     Review of Systems   GENERAL: No change in appetite, no fatigue, no weight changes, no fever, chills or weakness MOUTH and THROAT: Denies oral discomfort, gingival pain or bleeding RESPIRATORY: no cough, SOB, DOE, wheezing, hemoptysis CARDIAC: No chest pain, edema or palpitations GI: No abdominal pain, diarrhea, constipation, heart burn, nausea or vomiting GU: Denies dysuria, frequency, hematuria, or discharge NEUROLOGICAL: Denies dizziness, syncope, numbness, or headache PSYCHIATRIC: Denies feelings of depression or anxiety. No report of hallucinations, insomnia, paranoia, or agitation   Immunization History  Administered Date(s) Administered  . Influenza,inj,Quad PF,6+ Mos 01/28/2015  . Influenza-Unspecified 01/27/2015, 02/14/2016, 03/05/2018  . PPD Test 12/28/2016  . Pneumococcal Polysaccharide-23 01/21/2018  . Td 07/08/2017   Pertinent  Health Maintenance Due  Topic Date Due  . FOOT EXAM  03/27/1929  . OPHTHALMOLOGY EXAM  03[birth year]27[birth year]1931  . INFLUENZA VACCINE  12/27/2018  . PNA vac Low Risk Adult (2 of 2 - PCV13) 01/22/2019  . HEMOGLOBIN A1C  02/04/2019  . DEXA SCAN  Discontinued  Fall Risk  09/04/2016  Falls in the past year? No     Vitals:   09/04/18 0901  BP: (!) 179/72  Pulse: 84  Resp: 20  Temp: (!) 97.2 F (36.2 C)  TempSrc: Oral  SpO2: 100%  Weight: 139 lb (63 kg)  Height: 5\' 2"  (1.575 m)   Body mass index is 25.42 kg/m.  Physical Exam  GENERAL APPEARANCE: Well nourished. In no acute distress. Normal body habitus SKIN:  Left lower leg wound measuring 2 cm X 2 cm with slight serosanguinous drainage MOUTH and THROAT: Lips are without lesions. Oral mucosa is moist and without lesions. Tongue is normal in shape, size, and color and without lesions RESPIRATORY: Breathing is even & unlabored, BS CTAB CARDIAC: RRR, no murmur,no extra heart sounds, no edema GI: Abdomen soft, normal BS, no masses, no tenderness EXTREMITIES:  Able to move X 4 extremities NEUROLOGICAL: There is no  tremor. Speech is clear. Alert to self, disoriented to time and place. PSYCHIATRIC:  Affect and behavior are appropriate   Labs reviewed: Recent Labs    02/06/18 1120 07/04/18 1200 07/07/18 1256  NA 134* 133* 136  K 5.1 5.0 4.7  CL 101 102 103  CO2 24 21* 25  GLUCOSE 169* 345* 347*  BUN 33* 53* 40*  CREATININE 1.25* 1.39* 1.09*  CALCIUM 9.3 8.9 9.0   Recent Labs    07/04/18 1200 07/07/18 1256  AST 26 29  ALT 16 21  ALKPHOS 75 106  BILITOT 0.5 0.6  PROT 6.1* 6.6  ALBUMIN 3.1* 3.4*   Recent Labs    02/06/18 1120 07/04/18 1200 07/07/18 1256  WBC 6.3 8.7 5.3  NEUTROABS 3.8 6.0 3.1  HGB 10.6* 7.7* 7.8*  HCT 30.8* 26.5* 27.6*  MCV 95.1 93.3 95.8  PLT 229 207 215   Lab Results  Component Value Date   TSH 2.753 01/01/2018   Lab Results  Component Value Date   HGBA1C 8.7 (H) 08/04/2018   Lab Results  Component Value Date   CHOL 112 08/13/2017   HDL 45 08/13/2017   LDLCALC 39 08/13/2017   LDLDIRECT 58.0 09/04/2016   TRIG 142 08/13/2017   CHOLHDL 2.5 08/13/2017     Assessment/Plan  1. Uncontrolled hypertension - BPs are elevated, will increase lisinopril from 20mg  to 30 mg daily, BP twice daily x1 week  2. Type 2 diabetes mellitus with stage 3 chronic kidney disease, without long-term current use of insulin (HCC) Lab Results  Component Value Date   HGBA1C 8.7 (H) 08/04/2018  -CBGs slightly elevated, will increase Lantus from 5 units to 8 units at bedtime and continue Januvia 50 mg 1 tab daily, CBG a.m. and at bedtime  3. Vitamin B12 deficiency Lab Results  Component Value Date   VITAMINB12 886 07/11/2018  -Continue vitamin D B12 500 mcg 1 tab daily  4. Hypothyroidism due to acquired atrophy of thyroid Lab Results  Component Value Date   TSH 2.753 01/01/2018  -Continue levothyroxine 88 mcg 1 tab daily  5. History of stroke -Stable, continue Plavix 75 mg 1 tab daily, aspirin EC 81 mg 1 tab daily, lisinopril 20 mg 1 tab daily  6. Iron  deficiency anemia, unspecified iron deficiency anemia type Lab Results  Component Value Date   HGB 7.8 (L) 07/07/2018  -Continue ferrous sulfate 325 mg 1 tab daily     Family/ staff Communication: Discussed plan of care with resident.  Labs/tests ordered: None  Goals of care:   Long-term care  Durenda Age, NP [facility] and Adult Medicine [phone] (Monday-Friday 8:00 a.m. - 5:00 p.m.) [phone] (after hours)

## 2018-09-05 DIAGNOSIS — Z89421 Acquired absence of other right toe(s): Secondary | ICD-10-CM | POA: Diagnosis not present

## 2018-09-05 DIAGNOSIS — H409 Unspecified glaucoma: Secondary | ICD-10-CM | POA: Diagnosis not present

## 2018-09-05 DIAGNOSIS — R41841 Cognitive communication deficit: Secondary | ICD-10-CM | POA: Diagnosis not present

## 2018-09-05 DIAGNOSIS — E1151 Type 2 diabetes mellitus with diabetic peripheral angiopathy without gangrene: Secondary | ICD-10-CM | POA: Diagnosis not present

## 2018-09-05 DIAGNOSIS — R279 Unspecified lack of coordination: Secondary | ICD-10-CM | POA: Diagnosis not present

## 2018-09-08 DIAGNOSIS — H409 Unspecified glaucoma: Secondary | ICD-10-CM | POA: Diagnosis not present

## 2018-09-08 DIAGNOSIS — R279 Unspecified lack of coordination: Secondary | ICD-10-CM | POA: Diagnosis not present

## 2018-09-08 DIAGNOSIS — E1151 Type 2 diabetes mellitus with diabetic peripheral angiopathy without gangrene: Secondary | ICD-10-CM | POA: Diagnosis not present

## 2018-09-08 DIAGNOSIS — Z89421 Acquired absence of other right toe(s): Secondary | ICD-10-CM | POA: Diagnosis not present

## 2018-09-08 DIAGNOSIS — R41841 Cognitive communication deficit: Secondary | ICD-10-CM | POA: Diagnosis not present

## 2018-09-09 DIAGNOSIS — Z89421 Acquired absence of other right toe(s): Secondary | ICD-10-CM | POA: Diagnosis not present

## 2018-09-09 DIAGNOSIS — R279 Unspecified lack of coordination: Secondary | ICD-10-CM | POA: Diagnosis not present

## 2018-09-09 DIAGNOSIS — H409 Unspecified glaucoma: Secondary | ICD-10-CM | POA: Diagnosis not present

## 2018-09-09 DIAGNOSIS — R41841 Cognitive communication deficit: Secondary | ICD-10-CM | POA: Diagnosis not present

## 2018-09-09 DIAGNOSIS — E1151 Type 2 diabetes mellitus with diabetic peripheral angiopathy without gangrene: Secondary | ICD-10-CM | POA: Diagnosis not present

## 2018-09-10 DIAGNOSIS — R41841 Cognitive communication deficit: Secondary | ICD-10-CM | POA: Diagnosis not present

## 2018-09-10 DIAGNOSIS — Z89421 Acquired absence of other right toe(s): Secondary | ICD-10-CM | POA: Diagnosis not present

## 2018-09-10 DIAGNOSIS — R279 Unspecified lack of coordination: Secondary | ICD-10-CM | POA: Diagnosis not present

## 2018-09-10 DIAGNOSIS — E1151 Type 2 diabetes mellitus with diabetic peripheral angiopathy without gangrene: Secondary | ICD-10-CM | POA: Diagnosis not present

## 2018-09-10 DIAGNOSIS — H409 Unspecified glaucoma: Secondary | ICD-10-CM | POA: Diagnosis not present

## 2018-09-12 DIAGNOSIS — R279 Unspecified lack of coordination: Secondary | ICD-10-CM | POA: Diagnosis not present

## 2018-09-12 DIAGNOSIS — H409 Unspecified glaucoma: Secondary | ICD-10-CM | POA: Diagnosis not present

## 2018-09-12 DIAGNOSIS — Z89421 Acquired absence of other right toe(s): Secondary | ICD-10-CM | POA: Diagnosis not present

## 2018-09-12 DIAGNOSIS — E1151 Type 2 diabetes mellitus with diabetic peripheral angiopathy without gangrene: Secondary | ICD-10-CM | POA: Diagnosis not present

## 2018-09-12 DIAGNOSIS — R41841 Cognitive communication deficit: Secondary | ICD-10-CM | POA: Diagnosis not present

## 2018-09-15 DIAGNOSIS — E1151 Type 2 diabetes mellitus with diabetic peripheral angiopathy without gangrene: Secondary | ICD-10-CM | POA: Diagnosis not present

## 2018-09-15 DIAGNOSIS — Z89421 Acquired absence of other right toe(s): Secondary | ICD-10-CM | POA: Diagnosis not present

## 2018-09-15 DIAGNOSIS — R41841 Cognitive communication deficit: Secondary | ICD-10-CM | POA: Diagnosis not present

## 2018-09-15 DIAGNOSIS — R279 Unspecified lack of coordination: Secondary | ICD-10-CM | POA: Diagnosis not present

## 2018-09-15 DIAGNOSIS — H409 Unspecified glaucoma: Secondary | ICD-10-CM | POA: Diagnosis not present

## 2018-09-17 DIAGNOSIS — Z89421 Acquired absence of other right toe(s): Secondary | ICD-10-CM | POA: Diagnosis not present

## 2018-09-17 DIAGNOSIS — R279 Unspecified lack of coordination: Secondary | ICD-10-CM | POA: Diagnosis not present

## 2018-09-17 DIAGNOSIS — E1151 Type 2 diabetes mellitus with diabetic peripheral angiopathy without gangrene: Secondary | ICD-10-CM | POA: Diagnosis not present

## 2018-09-17 DIAGNOSIS — H409 Unspecified glaucoma: Secondary | ICD-10-CM | POA: Diagnosis not present

## 2018-09-17 DIAGNOSIS — R41841 Cognitive communication deficit: Secondary | ICD-10-CM | POA: Diagnosis not present

## 2018-09-18 DIAGNOSIS — Z89421 Acquired absence of other right toe(s): Secondary | ICD-10-CM | POA: Diagnosis not present

## 2018-09-18 DIAGNOSIS — R41841 Cognitive communication deficit: Secondary | ICD-10-CM | POA: Diagnosis not present

## 2018-09-18 DIAGNOSIS — E1151 Type 2 diabetes mellitus with diabetic peripheral angiopathy without gangrene: Secondary | ICD-10-CM | POA: Diagnosis not present

## 2018-09-18 DIAGNOSIS — R279 Unspecified lack of coordination: Secondary | ICD-10-CM | POA: Diagnosis not present

## 2018-09-18 DIAGNOSIS — H409 Unspecified glaucoma: Secondary | ICD-10-CM | POA: Diagnosis not present

## 2018-09-19 DIAGNOSIS — R41841 Cognitive communication deficit: Secondary | ICD-10-CM | POA: Diagnosis not present

## 2018-09-19 DIAGNOSIS — R279 Unspecified lack of coordination: Secondary | ICD-10-CM | POA: Diagnosis not present

## 2018-09-19 DIAGNOSIS — E1151 Type 2 diabetes mellitus with diabetic peripheral angiopathy without gangrene: Secondary | ICD-10-CM | POA: Diagnosis not present

## 2018-09-19 DIAGNOSIS — Z89421 Acquired absence of other right toe(s): Secondary | ICD-10-CM | POA: Diagnosis not present

## 2018-09-19 DIAGNOSIS — H409 Unspecified glaucoma: Secondary | ICD-10-CM | POA: Diagnosis not present

## 2018-09-22 DIAGNOSIS — H409 Unspecified glaucoma: Secondary | ICD-10-CM | POA: Diagnosis not present

## 2018-09-22 DIAGNOSIS — E1151 Type 2 diabetes mellitus with diabetic peripheral angiopathy without gangrene: Secondary | ICD-10-CM | POA: Diagnosis not present

## 2018-09-22 DIAGNOSIS — R279 Unspecified lack of coordination: Secondary | ICD-10-CM | POA: Diagnosis not present

## 2018-09-22 DIAGNOSIS — Z89421 Acquired absence of other right toe(s): Secondary | ICD-10-CM | POA: Diagnosis not present

## 2018-09-22 DIAGNOSIS — R41841 Cognitive communication deficit: Secondary | ICD-10-CM | POA: Diagnosis not present

## 2018-09-24 DIAGNOSIS — R279 Unspecified lack of coordination: Secondary | ICD-10-CM | POA: Diagnosis not present

## 2018-09-24 DIAGNOSIS — H409 Unspecified glaucoma: Secondary | ICD-10-CM | POA: Diagnosis not present

## 2018-09-24 DIAGNOSIS — E1151 Type 2 diabetes mellitus with diabetic peripheral angiopathy without gangrene: Secondary | ICD-10-CM | POA: Diagnosis not present

## 2018-09-24 DIAGNOSIS — R41841 Cognitive communication deficit: Secondary | ICD-10-CM | POA: Diagnosis not present

## 2018-09-24 DIAGNOSIS — Z89421 Acquired absence of other right toe(s): Secondary | ICD-10-CM | POA: Diagnosis not present

## 2018-09-25 DIAGNOSIS — R41841 Cognitive communication deficit: Secondary | ICD-10-CM | POA: Diagnosis not present

## 2018-09-25 DIAGNOSIS — R279 Unspecified lack of coordination: Secondary | ICD-10-CM | POA: Diagnosis not present

## 2018-09-25 DIAGNOSIS — H409 Unspecified glaucoma: Secondary | ICD-10-CM | POA: Diagnosis not present

## 2018-09-25 DIAGNOSIS — Z89421 Acquired absence of other right toe(s): Secondary | ICD-10-CM | POA: Diagnosis not present

## 2018-09-25 DIAGNOSIS — E1151 Type 2 diabetes mellitus with diabetic peripheral angiopathy without gangrene: Secondary | ICD-10-CM | POA: Diagnosis not present

## 2018-09-26 ENCOUNTER — Encounter
Admission: RE | Admit: 2018-09-26 | Discharge: 2018-09-26 | Disposition: A | Discharge status: Home / Self Care | Payer: Medicare Other | Source: Ambulatory Visit | Attending: Internal Medicine | Admitting: Internal Medicine

## 2018-09-26 DIAGNOSIS — H409 Unspecified glaucoma: Secondary | ICD-10-CM | POA: Diagnosis not present

## 2018-09-26 DIAGNOSIS — R41841 Cognitive communication deficit: Secondary | ICD-10-CM | POA: Diagnosis not present

## 2018-09-26 DIAGNOSIS — Z89421 Acquired absence of other right toe(s): Secondary | ICD-10-CM | POA: Diagnosis not present

## 2018-09-26 DIAGNOSIS — E1151 Type 2 diabetes mellitus with diabetic peripheral angiopathy without gangrene: Secondary | ICD-10-CM | POA: Diagnosis not present

## 2018-09-26 DIAGNOSIS — R279 Unspecified lack of coordination: Secondary | ICD-10-CM | POA: Diagnosis not present

## 2018-09-29 DIAGNOSIS — Z89421 Acquired absence of other right toe(s): Secondary | ICD-10-CM | POA: Diagnosis not present

## 2018-09-29 DIAGNOSIS — H409 Unspecified glaucoma: Secondary | ICD-10-CM | POA: Diagnosis not present

## 2018-09-29 DIAGNOSIS — R41841 Cognitive communication deficit: Secondary | ICD-10-CM | POA: Diagnosis not present

## 2018-09-29 DIAGNOSIS — R279 Unspecified lack of coordination: Secondary | ICD-10-CM | POA: Diagnosis not present

## 2018-09-29 DIAGNOSIS — E1151 Type 2 diabetes mellitus with diabetic peripheral angiopathy without gangrene: Secondary | ICD-10-CM | POA: Diagnosis not present

## 2018-10-01 DIAGNOSIS — Z89421 Acquired absence of other right toe(s): Secondary | ICD-10-CM | POA: Diagnosis not present

## 2018-10-01 DIAGNOSIS — E1151 Type 2 diabetes mellitus with diabetic peripheral angiopathy without gangrene: Secondary | ICD-10-CM | POA: Diagnosis not present

## 2018-10-01 DIAGNOSIS — R279 Unspecified lack of coordination: Secondary | ICD-10-CM | POA: Diagnosis not present

## 2018-10-01 DIAGNOSIS — R41841 Cognitive communication deficit: Secondary | ICD-10-CM | POA: Diagnosis not present

## 2018-10-01 DIAGNOSIS — H409 Unspecified glaucoma: Secondary | ICD-10-CM | POA: Diagnosis not present

## 2018-10-02 DIAGNOSIS — H409 Unspecified glaucoma: Secondary | ICD-10-CM | POA: Diagnosis not present

## 2018-10-02 DIAGNOSIS — R41841 Cognitive communication deficit: Secondary | ICD-10-CM | POA: Diagnosis not present

## 2018-10-02 DIAGNOSIS — R279 Unspecified lack of coordination: Secondary | ICD-10-CM | POA: Diagnosis not present

## 2018-10-02 DIAGNOSIS — E1151 Type 2 diabetes mellitus with diabetic peripheral angiopathy without gangrene: Secondary | ICD-10-CM | POA: Diagnosis not present

## 2018-10-02 DIAGNOSIS — Z89421 Acquired absence of other right toe(s): Secondary | ICD-10-CM | POA: Diagnosis not present

## 2018-10-06 ENCOUNTER — Encounter: Payer: Self-pay | Admitting: Adult Health

## 2018-10-06 ENCOUNTER — Non-Acute Institutional Stay (SKILLED_NURSING_FACILITY): Payer: Medicare Other | Admitting: Adult Health

## 2018-10-06 DIAGNOSIS — E1122 Type 2 diabetes mellitus with diabetic chronic kidney disease: Secondary | ICD-10-CM | POA: Diagnosis not present

## 2018-10-06 DIAGNOSIS — E034 Atrophy of thyroid (acquired): Secondary | ICD-10-CM | POA: Diagnosis not present

## 2018-10-06 DIAGNOSIS — D508 Other iron deficiency anemias: Secondary | ICD-10-CM

## 2018-10-06 DIAGNOSIS — I10 Essential (primary) hypertension: Secondary | ICD-10-CM | POA: Diagnosis not present

## 2018-10-06 DIAGNOSIS — G4709 Other insomnia: Secondary | ICD-10-CM | POA: Diagnosis not present

## 2018-10-06 DIAGNOSIS — N183 Chronic kidney disease, stage 3 unspecified: Secondary | ICD-10-CM

## 2018-10-06 DIAGNOSIS — Z8673 Personal history of transient ischemic attack (TIA), and cerebral infarction without residual deficits: Secondary | ICD-10-CM | POA: Diagnosis not present

## 2018-10-06 NOTE — Progress Notes (Signed)
[facility] Room Number: [place]:  SNF [phone]) Provider:  Durenda Age, DNP, FNP-BC  Patient Care Team: Kirk Ruths, MD as PCP - General (Internal Medicine) Ashley Fee, NP as Nurse Practitioner (Geriatric Medicine)  Extended Emergency Contact Information Primary Emergency Contact: [place] Phone: [phone] Relation: Niece Secondary Emergency Contact: Charolotte Eke [place] of [place] Phone: [phone] Relation: Nephew  Code Status:  DNR  Goals of care: Advanced Directive information Advanced Directives 09/04/2018  Does Patient Have a Medical Advance Directive? Yes  Type of Paramedic of [place];Living will;Out of facility DNR (pink MOST or yellow form)  Does patient want to make changes to medical advance directive? No - Patient declined  Copy of [place] in Chart? Yes - validated most recent copy scanned in chart (See row information)  Pre-existing out of facility DNR order (yellow form or pink MOST form) Yellow form placed in chart (order not valid for inpatient use)     Chief Complaint  Patient presents with  . Medical Management of Chronic Issues    Routine TVAB visit    HPI:  Pt is a 85 y.o. [name] seen today for medical management of chronic diseases.  She is a long-term care resident of [place]. She has a PMH of CAD, CKD, DM2, GERD, HTN, hypothyroidism, and stroke. She was seen in the room today. She was getting ready to be picked up for a doctor's appointment. She has a chronic wound on her left lower leg and will have a wound consult. CBGs -165, 224, 153, 215, 165, 241, 140, 241. BPs - 160/83, 145/82, 136/82, 148/79 and currently on lisinopril 30 mg 1 tab daily. She denies having pain. She had a call from her nephew and was so happy about it.    Past Medical History:  Diagnosis Date  . Acute gouty  arthritis 11/21/2016  . Aortic atherosclerosis ([place]) 09/04/2016  . Carotid artery disease ([place]) 11/21/2016  . Carotid stenosis 12/03/2015   Advanced heavily calcified atherosclerotic disease at both carotid bifurcations. Stenoses measured at 70% affecting the right proximal ICA and 80% affecting the left proximal ICA.  Marland Kitchen Chronic kidney disease   . Coronary atherosclerosis 09/04/2016  . Diverticulitis   . DM (diabetes mellitus), type 2 ([place]) 09/04/2016  . GERD (gastroesophageal reflux disease)   . HTN, goal below 140/90 11/21/2016  . Hyperlipidemia 06/20/2016  . Hypothyroidism, unspecified 11/21/2016  . Primary osteoarthritis 11/21/2016   involving multiple joints  . Stroke [facility])    Past Surgical History:  Procedure Laterality Date  . AMPUTATION Right 02/13/2018   Procedure: AMPUTATION RAY ( 2ND TOE );  Surgeon: [name], MD;  Location: [facility] ORS;  Service: Vascular;  Laterality: Right;  . CATARACT EXTRACTION Bilateral   . COLONOSCOPY    . Finger crystal removal     right 3rd  . LOWER EXTREMITY ANGIOGRAPHY Right 01/06/2018   Procedure: LOWER EXTREMITY ANGIOGRAPHY;  Surgeon: [name], MD;  Location: [place] CV LAB;  Service: Cardiovascular;  Laterality: Right;  . toe crystal removal     right 1st toe    No Known Allergies  Outpatient Encounter Medications as of 10/06/2018  Medication Sig  . acetaminophen (MAPAP ARTHRITIS PAIN) 650 MG CR tablet Take 650 mg 2 (two) times daily by mouth.   Marland Kitchen acetaminophen (TYLENOL) 325 MG tablet Take 650 mg by mouth every 4 (four)  hours as needed. for pain/ increased temp. May be administered orally, per G-tube if needed or rectally if unable to swallow (separate order). Maximum dose for 24 hours is 3,000 mg from all sources of Acetaminophen/ Tylenol  . alum & mag hydroxide-simeth (MAALOX PLUS) 400-400-40 MG/5ML suspension Take 30 mLs by mouth every 4 (four) hours as needed for indigestion.  . Amino Acids-Protein Hydrolys (FEEDING SUPPLEMENT,  PRO-STAT SUGAR FREE 64,) LIQD Take 30 mLs by mouth daily. To Promote Wound Healing. Wound on right lower leg  . aspirin EC 81 MG tablet Take 1 tablet (81 mg total) by mouth daily.  . Cholecalciferol (VITAMIN D3) 2000 units capsule Take 2,000 Units by mouth daily.   . clopidogrel (PLAVIX) 75 MG tablet Take 1 tablet (75 mg total) by mouth daily.  . ferrous sulfate 325 (65 FE) MG tablet Take 325 mg by mouth daily with breakfast.  . Glucerna (GLUCERNA) LIQD Take 237 mLs by mouth 2 (two) times daily.  . [place] (DERMACLOUD) CREA Apply liberal amount topically to area of skin irritation as needed.  OK to leave at bedside  . Insulin Glargine (LANTUS SOLOSTAR) 100 UNIT/ML Solostar Pen Inject 8 Units into the skin at bedtime.   Marland Kitchen latanoprost (XALATAN) 0.005 % ophthalmic solution Place 1 drop into both eyes at bedtime.   Marland Kitchen levothyroxine (SYNTHROID, LEVOTHROID) 88 MCG tablet Take 88 mcg by mouth daily before breakfast.  . lisinopril (ZESTRIL) 30 MG tablet Take 30 mg by mouth daily.  . magnesium hydroxide (MILK OF MAGNESIA) 400 MG/5ML suspension Take 30 mLs by mouth every 4 (four) hours as needed for mild constipation. Constipation/ no BM for 2 days  . Magnesium Oxide 250 MG TABS Take 250 mg by mouth daily.  . pantoprazole (PROTONIX) 40 MG tablet Take 40 mg by mouth daily.  . polyethylene glycol (MIRALAX / GLYCOLAX) packet Take 17 g by mouth daily.  Cathie Olden (AQUACEL-AG FOAM EX) Cleanse wound with normal saline, skin prep periwound, cut Aquacel to size of wound and apply to wound base, cover with allevyn  . sitaGLIPtin (JANUVIA) 50 MG tablet Take 50 mg by mouth daily.  . traZODone (DESYREL) 50 MG tablet Take 25 mg by mouth at bedtime. 0.5 tablet to = 25 mg  . Trolamine Salicylate (ASPERCREME) 10 % LOTN Apply thin film topically to areas of pain 3 times a day as needed  . UNABLE TO FIND Diet: regular. Low NA, NCS  . vitamin B-12 (CYANOCOBALAMIN) 500 MCG tablet Take 500 mcg by mouth daily.  .  [DISCONTINUED] ENSURE (ENSURE) Take 237 mLs by mouth daily.  . [DISCONTINUED] lisinopril (PRINIVIL,ZESTRIL) 20 MG tablet Take 20 mg by mouth daily.   No facility-administered encounter medications on file as of 10/06/2018.     Review of Systems  GENERAL: No change in appetite, no fatigue, no weight changes, no fever, chills or weakness MOUTH and THROAT: Denies oral discomfort, gingival pain or bleeding RESPIRATORY: no cough, SOB, DOE, wheezing, hemoptysis CARDIAC: No chest pain, edema or palpitations GI: No abdominal pain, diarrhea, constipation, heart burn, nausea or vomiting NEUROLOGICAL: Denies dizziness, syncope, numbness, or headache PSYCHIATRIC: Denies feelings of depression or anxiety. No report of hallucinations, insomnia, paranoia, or agitation    Immunization History  Administered Date(s) Administered  . Influenza,inj,Quad PF,6+ Mos 01/28/2015  . Influenza-Unspecified 01/27/2015, 02/14/2016, 03/05/2018  . PPD Test 12/28/2016  . Pneumococcal Polysaccharide-23 01/21/2018  . Td 07/08/2017   Pertinent  Health Maintenance Due  Topic Date Due  . FOOT EXAM  03/27/1929  . OPHTHALMOLOGY EXAM  [birth year]31  . INFLUENZA VACCINE  12/27/2018  . PNA vac Low Risk Adult (2 of 2 - PCV13) 01/22/2019  . HEMOGLOBIN A1C  02/04/2019  . DEXA SCAN  Discontinued   Fall Risk  09/04/2016  Falls in the past year? No     Vitals:   10/06/18 0954 10/06/18 0956  BP: (!) 160/83 (!) 145/82  Pulse: 77   Resp: 20   Temp: 97.8 F (36.6 C)   TempSrc: Oral   SpO2: 98%   Weight: 131 lb (59.4 kg)   Height: 5\' 2"  (1.575 m)    Body mass index is 23.96 kg/m.  Physical Exam  GENERAL APPEARANCE: Well nourished. In no acute distress. Normal body habitus SKIN:  Left lower leg wound with dressing MOUTH and THROAT: Lips are without lesions. Oral mucosa is moist and without lesions. Tongue is normal in shape, size, and color and without lesions RESPIRATORY: Breathing is even & unlabored, BS CTAB  CARDIAC: RRR, no murmur,no extra heart sounds, no edema GI: Abdomen soft, normal BS, no masses, no tenderness EXTREMITIES:  Able to move X 4 extremities NEUROLOGICAL: There is no tremor. Speech is clear. Alert to self, disoriented to time and place. PSYCHIATRIC:  Affect and behavior are appropriate  Labs reviewed: Recent Labs    02/06/18 1120 07/04/18 1200 07/07/18 1256  NA 134* 133* 136  K 5.1 5.0 4.7  CL 101 102 103  CO2 24 21* 25  GLUCOSE 169* 345* 347*  BUN 33* [place]* 40*  CREATININE 1.25* 1.39* 1.09*  CALCIUM 9.3 8.9 9.0   Recent Labs    07/04/18 1200 07/07/18 1256  AST 26 29  ALT 16 21  ALKPHOS 75 106  BILITOT 0.5 0.6  PROT 6.1* 6.6  ALBUMIN 3.1* 3.4*   Recent Labs    02/06/18 1120 07/04/18 1200 07/07/18 1256  WBC 6.3 8.7 5.3  NEUTROABS 3.8 6.0 3.1  HGB 10.6* 7.7* 7.8*  HCT 30.8* 26.5* 27.6*  MCV 95.1 93.3 95.8  PLT 229 207 215   Lab Results  Component Value Date   TSH 2.753 01/01/2018   Lab Results  Component Value Date   HGBA1C 8.7 (H) 08/04/2018   Lab Results  Component Value Date   CHOL 112 08/13/2017   HDL 45 08/13/2017   LDLCALC 39 08/13/2017   LDLDIRECT 58.0 09/04/2016   TRIG 142 08/13/2017   CHOLHDL 2.5 08/13/2017    Assessment/Plan  1. History of stroke -Stable, continue Plavix 75 mg 1 tab daily and aspirin 81 mg 1 tab daily  2. Type 2 diabetes mellitus with stage 3 chronic kidney disease, without long-term current use of insulin (HCC) - stable, continue Januvia 50 mg 1 tab daily Lantus 100 unit/mL inject 8 units subcutaneously at bedtime, CBG twice a day  3. Essential hypertension -BP 160/83, continue lisinopril 30 mg 1 tab daily, check BP BID X 1 week  4. Other iron deficiency anemia Lab Results  Component Value Date   HGB 7.8 (L) 07/07/2018  -Continue ferrous sulfate 325 mg 1 tab daily  5. Other insomnia -Continue trazodone 50 mg 1/2 tab = 25 mg at bedtime  6. Hypothyroidism due to acquired atrophy of thyroid Lab  Results  Component Value Date   TSH 2.753 01/01/2018  -Continue levothyroxine 88 mcg 1 tab daily     Family/ staff Communication: Discussed plan of care with resident.  Labs/tests ordered:  None  Goals of care:   Long-term care.     Medina-Vargas, DNP, FNP-BC [facility] and Adult Medicine [phone] (Monday-Friday 8:00 a.m. - 5:00 p.m.) [phone] (after hours)

## 2018-10-08 DIAGNOSIS — Z89421 Acquired absence of other right toe(s): Secondary | ICD-10-CM | POA: Diagnosis not present

## 2018-10-08 DIAGNOSIS — H409 Unspecified glaucoma: Secondary | ICD-10-CM | POA: Diagnosis not present

## 2018-10-08 DIAGNOSIS — R41841 Cognitive communication deficit: Secondary | ICD-10-CM | POA: Diagnosis not present

## 2018-10-08 DIAGNOSIS — E1151 Type 2 diabetes mellitus with diabetic peripheral angiopathy without gangrene: Secondary | ICD-10-CM | POA: Diagnosis not present

## 2018-10-08 DIAGNOSIS — R279 Unspecified lack of coordination: Secondary | ICD-10-CM | POA: Diagnosis not present

## 2018-10-09 ENCOUNTER — Other Ambulatory Visit: Payer: Self-pay

## 2018-10-09 ENCOUNTER — Encounter: Payer: Medicare Other | Attending: Physician Assistant | Admitting: Physician Assistant

## 2018-10-09 DIAGNOSIS — I7389 Other specified peripheral vascular diseases: Secondary | ICD-10-CM | POA: Insufficient documentation

## 2018-10-09 DIAGNOSIS — Z89421 Acquired absence of other right toe(s): Secondary | ICD-10-CM | POA: Diagnosis not present

## 2018-10-09 DIAGNOSIS — I132 Hypertensive heart and chronic kidney disease with heart failure and with stage 5 chronic kidney disease, or end stage renal disease: Secondary | ICD-10-CM | POA: Diagnosis not present

## 2018-10-09 DIAGNOSIS — R41841 Cognitive communication deficit: Secondary | ICD-10-CM | POA: Diagnosis not present

## 2018-10-09 DIAGNOSIS — E11622 Type 2 diabetes mellitus with other skin ulcer: Secondary | ICD-10-CM | POA: Diagnosis not present

## 2018-10-09 DIAGNOSIS — L97822 Non-pressure chronic ulcer of other part of left lower leg with fat layer exposed: Secondary | ICD-10-CM | POA: Insufficient documentation

## 2018-10-09 DIAGNOSIS — L97212 Non-pressure chronic ulcer of right calf with fat layer exposed: Secondary | ICD-10-CM | POA: Insufficient documentation

## 2018-10-09 DIAGNOSIS — H900 Conductive hearing loss, bilateral: Secondary | ICD-10-CM | POA: Insufficient documentation

## 2018-10-09 DIAGNOSIS — N186 End stage renal disease: Secondary | ICD-10-CM | POA: Insufficient documentation

## 2018-10-09 DIAGNOSIS — R279 Unspecified lack of coordination: Secondary | ICD-10-CM | POA: Diagnosis not present

## 2018-10-09 DIAGNOSIS — H409 Unspecified glaucoma: Secondary | ICD-10-CM | POA: Diagnosis not present

## 2018-10-09 DIAGNOSIS — Z95820 Peripheral vascular angioplasty status with implants and grafts: Secondary | ICD-10-CM | POA: Insufficient documentation

## 2018-10-09 DIAGNOSIS — M109 Gout, unspecified: Secondary | ICD-10-CM | POA: Insufficient documentation

## 2018-10-09 DIAGNOSIS — L97829 Non-pressure chronic ulcer of other part of left lower leg with unspecified severity: Secondary | ICD-10-CM | POA: Diagnosis present

## 2018-10-09 DIAGNOSIS — E1151 Type 2 diabetes mellitus with diabetic peripheral angiopathy without gangrene: Secondary | ICD-10-CM | POA: Diagnosis not present

## 2018-10-09 NOTE — Progress Notes (Signed)
MECCA, GUITRON ([id number]) Visit Report for 10/09/2018 Abuse/Suicide Risk Screen Details Patient Name: Griggs, [name] Date of Service: 10/09/2018 10:45 AM Medical Record Number: [id number] Patient Account Number: [id number] Date of Birth/Sex: 01/26/1919 (85 y.o. F) Treating RN: Harold Barban Primary Care Maverick Dieudonne: Frazier Richards Other Clinician: Referring Karanvir Balderston: Frazier Richards Treating Racheal Mathurin/Extender: Melburn Hake, HOYT Weeks in Treatment: 0 Abuse/Suicide Risk Screen Items Answer ABUSE RISK SCREEN: Has anyone close to you tried to hurt or harm you recentlyo No Do you feel uncomfortable with anyone in your familyo No Has anyone forced you do things that you didnot want to doo No Electronic Signature(s) Signed: 10/09/2018 3:33:18 PM By: Harold Barban Entered By: Harold Barban on 10/09/2018 11:05:52 Royall, Niasia P. ([id number]) -------------------------------------------------------------------------------- Activities of Daily Living Details Patient Name: Vanepps, Lakenzie P. Date of Service: 10/09/2018 10:45 AM Medical Record Number: [id number] Patient Account Number: [id number] Date of Birth/Sex: 1919-05-14 (85 y.o. F) Treating RN: Harold Barban Primary Care Lyric Hoar: Frazier Richards Other Clinician: Referring Gareth Fitzner: Frazier Richards Treating Candiace West/Extender: Melburn Hake, HOYT Weeks in Treatment: 0 Activities of Daily Living Items Answer Activities of Daily Living (Please select one for each item) Drive Automobile Not Able Take Medications Need Assistance Use Telephone Need Assistance Care for Appearance Need Assistance Use Toilet Need Assistance Bath / Shower Need Assistance Dress Self Need Assistance Feed Self Completely Able Walk Need Assistance Get In / Out Bed Need Assistance Housework Not Able Prepare Meals Not Able Handle Money Not Able Shop for Self Not Able Electronic Signature(s) Signed: 10/09/2018 3:33:18 PM By: Harold Barban Entered By:  Harold Barban on 10/09/2018 11:07:20 Flippen, Forest P. ([id number]) -------------------------------------------------------------------------------- Education Screening Details Patient Name: Dolinar, Tangala P. Date of Service: 10/09/2018 10:45 AM Medical Record Number: [id number] Patient Account Number: [id number] Date of Birth/Sex: 07/05/1918 (85 y.o. F) Treating RN: Harold Barban Primary Care Chaz Ronning: Frazier Richards Other Clinician: Referring Shatora Weatherbee: Frazier Richards Treating Duan Scharnhorst/Extender: Melburn Hake, HOYT Weeks in Treatment: 0 Primary Learner Assessed: Caregiver Nephew, POA Reason Patient is not Primary Learner: HOH Learning Preferences/Education Level/Primary Language Learning Preference: Explanation Highest Education Level: High School Preferred Language: English Cognitive Barrier Language Barrier: No Translator Needed: No Memory Deficit: No Emotional Barrier: No Cultural/Religious Beliefs Affecting Medical Care: No Physical Barrier Impaired Vision: No Impaired Hearing: No Knowledge/Comprehension Knowledge Level: High Comprehension Level: High Ability to understand written High instructions: Ability to understand verbal High instructions: Motivation Anxiety Level: Calm Cooperation: Cooperative Education Importance: Acknowledges Need Interest in Health Problems: Asks Questions Perception: Coherent Willingness to Engage in Self- High Management Activities: Readiness to Engage in Self- High Management Activities: Electronic Signature(s) Signed: 10/09/2018 3:33:18 PM By: Harold Barban Entered By: Harold Barban on 10/09/2018 11:07:58 Klipfel, Dhanya P. ([id number]) -------------------------------------------------------------------------------- Fall Risk Assessment Details Patient Name: Regas, Erynne P. Date of Service: 10/09/2018 10:45 AM Medical Record Number: [id number] Patient Account Number: [id number] Date of Birth/Sex: Jan 26, 1919 (85 y.o. F) Treating  RN: Harold Barban Primary Care Gwendlyon Zumbro: Frazier Richards Other Clinician: Referring Rayane Gallardo: Frazier Richards Treating Candela Krul/Extender: Melburn Hake, HOYT Weeks in Treatment: 0 Fall Risk Assessment Items Have you had 2 or more falls in the last 12 monthso 0 No Have you had any fall that resulted in injury in the last 12 monthso 0 No FALLS RISK SCREEN History of falling - immediate or within 3 months 0 No Secondary diagnosis (Do you have 2 or more medical diagnoseso) 0 No Ambulatory aid None/bed rest/wheelchair/nurse 0 No Crutches/cane/walker 0 No Furniture 0 No Intravenous therapy Access/Saline/Heparin Lock 0 No Gait/Transferring Normal/ bed rest/  wheelchair 0 No Weak (short steps with or without shuffle, stooped but able to lift head while 10 Yes walking, may seek support from furniture) Impaired (short steps with shuffle, may have difficulty arising from chair, head 20 Yes down, impaired balance) Mental Status Oriented to own ability 0 Yes Electronic Signature(s) Signed: 10/09/2018 3:33:18 PM By: Harold Barban Entered By: Harold Barban on 10/09/2018 11:08:40 Osei, Sabrinia P. ([id number]) -------------------------------------------------------------------------------- Foot Assessment Details Patient Name: Jaros, Noeli P. Date of Service: 10/09/2018 10:45 AM Medical Record Number: [id number] Patient Account Number: [id number] Date of Birth/Sex: 1918/09/29 (85 y.o. F) Treating RN: Harold Barban Primary Care Cheryll Keisler: Frazier Richards Other Clinician: Referring Treyton Slimp: Frazier Richards Treating Chasta Deshpande/Extender: Melburn Hake, HOYT Weeks in Treatment: 0 Foot Assessment Items Site Locations + = Sensation present, - = Sensation absent, C = Callus, U = Ulcer R = Redness, W = Warmth, M = Maceration, PU = Pre-ulcerative lesion F = Fissure, S = Swelling, D = Dryness Assessment Right: Left: Other Deformity: No No Prior Foot Ulcer: No No Prior Amputation: No  No Charcot Joint: No No Ambulatory Status: Ambulatory With Help Assistance Device: Wheelchair Gait: Buyer, retail Signature(s) Signed: 10/09/2018 3:33:18 PM By: Harold Barban Entered By: Harold Barban on 10/09/2018 11:09:20 Zanders, Junella P. ([id number]) -------------------------------------------------------------------------------- Nutrition Risk Screening Details Patient Name: Fiola, Mckenna P. Date of Service: 10/09/2018 10:45 AM Medical Record Number: [id number] Patient Account Number: [id number] Date of Birth/Sex: 09-26-1918 (85 y.o. F) Treating RN: Harold Barban Primary Care Gaspard Isbell: Frazier Richards Other Clinician: Referring Janean Eischen: Frazier Richards Treating Kaimani Clayson/Extender: Melburn Hake, HOYT Weeks in Treatment: 0 Height (in): 64 Weight (lbs): 130 Body Mass Index (BMI): 22.3 Nutrition Risk Screening Items Score Screening NUTRITION RISK SCREEN: I have an illness or condition that made me change the kind and/or amount of 0 No food I eat I eat fewer than two meals per day 0 No I eat few fruits and vegetables, or milk products 0 No I have three or more drinks of beer, liquor or wine almost every day 0 No I have tooth or mouth problems that make it hard for me to eat 0 No I don't always have enough money to buy the food I need 0 No I eat alone most of the time 0 No I take three or more different prescribed or over-the-counter drugs a day 1 Yes Without wanting to, I have lost or gained 10 pounds in the last six months 0 No I am not always physically able to shop, cook and/or feed myself 0 No Nutrition Protocols Good Risk Protocol Moderate Risk Protocol High Risk Proctocol Risk Level: Good Risk Score: 1 Electronic Signature(s) Signed: 10/09/2018 3:33:18 PM By: Harold Barban Entered By: Harold Barban on 10/09/2018 11:09:02

## 2018-10-09 NOTE — Progress Notes (Signed)
SHADAE, REINO ([id number]) Visit Report for 10/09/2018 Chief Complaint Document Details Patient Name: [name], [name] Date of Service: 10/09/2018 10:45 AM Medical Record Number: [id number] Patient Account Number: [id number] Date of Birth/Sex: 02-06-1919 (85 y.o. F) Treating RN: Cornell Barman Primary Care Provider: Frazier Richards Other Clinician: Referring Provider: Frazier Richards Treating Provider/Extender: Melburn Hake, HOYT Weeks in Treatment: 0 Information Obtained from: Patient Chief Complaint Left lower leg ulcer Electronic Signature(s) Signed: 10/09/2018 5:10:17 PM By: Worthy Keeler PA-C Entered By: Worthy Keeler on 10/09/2018 11:17:48 Dattilio, Montasia P. ([id number]) -------------------------------------------------------------------------------- HPI Details Patient Name: [name], [name] Date of Service: 10/09/2018 10:45 AM Medical Record Number: [id number] Patient Account Number: [id number] Date of Birth/Sex: [birth year] (85 y.o. F) Treating RN: Cornell Barman Primary Care Provider: Frazier Richards Other Clinician: Referring Provider: Frazier Richards Treating Provider/Extender: Melburn Hake, HOYT Weeks in Treatment: 0 History of Present Illness HPI Description: 01/30/18-She is seen in initial evaluation for a right plantar calf ulcer. She is being treated for both the calf ulcer and a right second toe ulcer; the right second toe is planned for amputation on 9/19. The facility has been treating both ulcers with santyl. On 8/12 she underwent angiogram of the right lower extremity with angioplasty to the posterior tibial, right mid distal SFA, popliteal artery with stent placement to the right popliteal artery; post-procedure ABI-non-compressible bilaterally, RIGHT TBI 0.25, LEFT TBI 0.55. The right posterior calf ulcer is healthy with minimal amount of non-viable tissue remaining. We will continue with santyl, with expectation to change treatment plan next week. She is a diabetic with  last A1c 7.6 (01/2018) 02/07/18 on evaluation today patient actually appears to be showing signs of good improvement in regard to the right posterior lower extremity. With that being said the right second toe ulcer is actually plan for amputation at this point secondary to infection and osteomyelitis. This is actually scheduled for next Thursday. With that being said her wound seems to be healing quite nicely in regard to the posterior ulcer at this time she does have some Slough noted. That's in regard to lower extremity. 02/20/18 on evaluation today patient presents after having had invitation of her right second toe which was performed this past Thursday, when we could go by Dr. dew. Fortunately that seems to be doing fairly well although he's managing that we're not really do anything in that regard at this time. In regard to her posterior ulcer she actually appears to be doing very well. This infection is an excellent granulation bed there's really no slough noted on the surface of the wound at this point I do believe the Santyl has done its job currently. I think were ready to switch to something different. 02/27/18 on evaluation today patient actually appears to be showing signs of great improvement in regard to her lower extremity ulcer. In fact this is almost completely healed. I'm very pleased with how things seem to be progressing. 03/13/18 on evaluation today patient actually appears to be doing very well in regard to her wound in fact this appears to be completely healed. With that being said she does have a rash around the area where the wound was I believe this is likely due to the bandaging and there is really no open wound just some irritation in this area. Overall she is doing very well. She did see Dr. dew today in regard to her toe amputation site that appears to be doing well the sutures were taken out at this point. Readmission:  10/09/18 this is the patient whom I have previously  seen here in the clinic for an issue that she had on her right lower extremity actually. With that being said today she has a wound on her left lower extremity which actually appears to be doing quite well all things considering. She actually has some new skin growing over the area and despite the fact that Silvadene has been the main treatment of choice I feel like she has made improvements which is good news. No fevers, chills, nausea, or vomiting noted at this time. Electronic Signature(s) Signed: 10/09/2018 5:10:17 PM By: Worthy Keeler PA-C Entered By: Worthy Keeler on 10/09/2018 16:59:45 Ventresca, Takeia P. ([id number]) -------------------------------------------------------------------------------- Physical Exam Details Patient Name: [name], [name] Date of Service: 10/09/2018 10:45 AM Medical Record Number: [id number] Patient Account Number: [id number] Date of Birth/Sex: 19-Feb-1919 (85 y.o. F) Treating RN: Cornell Barman Primary Care Provider: Frazier Richards Other Clinician: Referring Provider: Frazier Richards Treating Provider/Extender: Melburn Hake, HOYT Weeks in Treatment: 0 Constitutional patient is hypertensive.. pulse regular and within target range for patient.Marland Kitchen respirations regular, non-labored and within target range for patient.Marland Kitchen temperature within target range for patient.. Well-nourished and well-hydrated in no acute distress. Eyes conjunctiva clear no eyelid edema noted. pupils equal round and reactive to light and accommodation. Ears, Nose, Mouth, and Throat no gross abnormality of ear auricles or external auditory canals. patient has hearing loss. mucus membranes moist. Respiratory normal breathing without difficulty. clear to auscultation bilaterally. Cardiovascular regular rate and rhythm with normal S1, S2. 1+ dorsalis pedis/posterior tibialis pulses. no clubbing, cyanosis, significant edema, <3 sec cap refill. Gastrointestinal (GI) soft, non-tender, non-distended,  +BS. no ventral hernia noted. Musculoskeletal Patient unable to walk without assistance. Psychiatric Patient is not able to cooperate in decision making regarding care. Patient is oriented to person only. pleasant and cooperative. Notes Upon evaluation patient's wound bed actually shows signs of excellent epithelialization which is great news there does not appear to be signs of active infection which is also great news. Overall I'm very pleased with how things seem to be going even for the first time I've seen her today regarding this wound. She doesn't have any significant pain also good news. No sharp debridement was required at this point. Electronic Signature(s) Signed: 10/09/2018 5:10:17 PM By: Worthy Keeler PA-C Entered By: Worthy Keeler on 10/09/2018 17:01:07 Korson, Herbert P. ([id number]) -------------------------------------------------------------------------------- Physician Orders Details Patient Name: Portnoy, Odetta P. Date of Service: 10/09/2018 10:45 AM Medical Record Number: [id number] Patient Account Number: [id number] Date of Birth/Sex: [birth year] (85 y.o. F) Treating RN: Cornell Barman Primary Care Provider: Frazier Richards Other Clinician: Referring Provider: Frazier Richards Treating Provider/Extender: Melburn Hake, HOYT Weeks in Treatment: 0 Verbal / Phone Orders: No Diagnosis Coding ICD-10 Coding Code Description E11.622 Type 2 diabetes mellitus with other skin ulcer I73.89 Other specified peripheral vascular diseases L97.822 Non-pressure chronic ulcer of other part of left lower leg with fat layer exposed I10 Essential (primary) hypertension H90.0 Conductive hearing loss, bilateral Wound Cleansing Wound #3 Left,Proximal,Anterior Lower Leg o Clean wound with Normal Saline. o May Shower, gently pat wound dry prior to applying new dressing. Anesthetic (add to Medication List) Wound #3 Left,Proximal,Anterior Lower Leg o Topical Lidocaine 4% cream applied to  wound bed prior to debridement (In Clinic Only). Primary Wound Dressing Wound #3 Left,Proximal,Anterior Lower Leg o Silver Collagen - moistened with saline Secondary Dressing Wound #3 Left,Proximal,Anterior Lower Leg o ABD and Kerlix/Conform Dressing Change Frequency Wound #3 Left,Proximal,Anterior Lower Leg o  Three times weekly Follow-up Appointments Wound #3 Left,Proximal,Anterior Lower Leg o Return Appointment in 2 weeks. Additional Orders / Instructions Wound #3 Left,Proximal,Anterior Lower Leg o Other: - Orders sent to [facility] [employer]) Signed: 10/09/2018 4:49:14 PM By: Gretta Cool, BSN, RN, CWS, Kim RN, BSN Cabell, Adah P. ([id number]) Signed: 10/09/2018 5:10:17 PM By: Worthy Keeler PA-C Entered By: Gretta Cool, BSN, RN, CWS, Kim on 10/09/2018 11:27:22 Shiflet, Takyra P. ([id number]) -------------------------------------------------------------------------------- Problem List Details Patient Name: Adderly, Idania P. Date of Service: 10/09/2018 10:45 AM Medical Record Number: [id number] Patient Account Number: [id number] Date of Birth/Sex: 09-05-1918 (85 y.o. F) Treating RN: Cornell Barman Primary Care Provider: Frazier Richards Other Clinician: Referring Provider: Frazier Richards Treating Provider/Extender: Melburn Hake, HOYT Weeks in Treatment: 0 Active Problems ICD-10 Evaluated Encounter Code Description Active Date Today Diagnosis E11.622 Type 2 diabetes mellitus with other skin ulcer 10/09/2018 No Yes I73.89 Other specified peripheral vascular diseases 10/09/2018 No Yes L97.822 Non-pressure chronic ulcer of other part of left lower leg with 10/09/2018 No Yes fat layer exposed I10 Essential (primary) hypertension 10/09/2018 No Yes H90.0 Conductive hearing loss, bilateral 10/09/2018 No Yes Inactive Problems Resolved Problems Electronic Signature(s) Signed: 10/09/2018 5:10:17 PM By: Worthy Keeler PA-C Entered By: Worthy Keeler on 10/09/2018  11:16:46 Portier, Danetra P. ([id number]) -------------------------------------------------------------------------------- Progress Note Details Patient Name: Haddix, Aolani P. Date of Service: 10/09/2018 10:45 AM Medical Record Number: [id number] Patient Account Number: [id number] Date of Birth/Sex: 01/11/1919 (85 y.o. F) Treating RN: Cornell Barman Primary Care Provider: Frazier Richards Other Clinician: Referring Provider: Frazier Richards Treating Provider/Extender: Melburn Hake, HOYT Weeks in Treatment: 0 Subjective Chief Complaint Information obtained from Patient Left lower leg ulcer History of Present Illness (HPI) 01/30/18-She is seen in initial evaluation for a right plantar calf ulcer. She is being treated for both the calf ulcer and a right second toe ulcer; the right second toe is planned for amputation on 9/19. The facility has been treating both ulcers with santyl. On 8/12 she underwent angiogram of the right lower extremity with angioplasty to the posterior tibial, right mid distal SFA, popliteal artery with stent placement to the right popliteal artery; post-procedure ABI-non-compressible bilaterally, RIGHT TBI 0.25, LEFT TBI 0.55. The right posterior calf ulcer is healthy with minimal amount of non-viable tissue remaining. We will continue with santyl, with expectation to change treatment plan next week. She is a diabetic with last A1c 7.6 (01/2018) 02/07/18 on evaluation today patient actually appears to be showing signs of good improvement in regard to the right posterior lower extremity. With that being said the right second toe ulcer is actually plan for amputation at this point secondary to infection and osteomyelitis. This is actually scheduled for next Thursday. With that being said her wound seems to be healing quite nicely in regard to the posterior ulcer at this time she does have some Slough noted. That's in regard to lower extremity. 02/20/18 on evaluation today patient  presents after having had invitation of her right second toe which was performed this past Thursday, when we could go by Dr. dew. Fortunately that seems to be doing fairly well although he's managing that we're not really do anything in that regard at this time. In regard to her posterior ulcer she actually appears to be doing very well. This infection is an excellent granulation bed there's really no slough noted on the surface of the wound at this point I do believe the Santyl has done its job currently. I think were ready to switch  to something different. 02/27/18 on evaluation today patient actually appears to be showing signs of great improvement in regard to her lower extremity ulcer. In fact this is almost completely healed. I'm very pleased with how things seem to be progressing. 03/13/18 on evaluation today patient actually appears to be doing very well in regard to her wound in fact this appears to be completely healed. With that being said she does have a rash around the area where the wound was I believe this is likely due to the bandaging and there is really no open wound just some irritation in this area. Overall she is doing very well. She did see Dr. dew today in regard to her toe amputation site that appears to be doing well the sutures were taken out at this point. Readmission: 10/09/18 this is the patient whom I have previously seen here in the clinic for an issue that she had on her right lower extremity actually. With that being said today she has a wound on her left lower extremity which actually appears to be doing quite well all things considering. She actually has some new skin growing over the area and despite the fact that Silvadene has been the main treatment of choice I feel like she has made improvements which is good news. No fevers, chills, nausea, or vomiting noted at this time. Patient History Information obtained from Patient. Allergies Williamsen, Nevaya P.  ([id number]) No Known Allergies Social History Never smoker, Marital Status - Widowed, Alcohol Use - Never, Drug Use - No History, Caffeine Use - Moderate. Medical History Eyes Patient has history of Glaucoma Denies history of Cataracts, Optic Neuritis Ear/Nose/Mouth/Throat Denies history of Chronic sinus problems/congestion, Middle ear problems Hematologic/Lymphatic Patient has history of Anemia Denies history of Hemophilia, Human Immunodeficiency Virus, Lymphedema, Sickle Cell Disease Respiratory Denies history of Aspiration, Asthma, Chronic Obstructive Pulmonary Disease (COPD), Pneumothorax, Sleep Apnea, Tuberculosis Cardiovascular Patient has history of Hypertension, Peripheral Arterial Disease Denies history of Angina, Arrhythmia, Congestive Heart Failure, Coronary Artery Disease, Deep Vein Thrombosis, Hypotension, Myocardial Infarction, Peripheral Venous Disease, Phlebitis, Vasculitis Gastrointestinal Denies history of Cirrhosis , Colitis, Crohn s, Hepatitis A, Hepatitis B, Hepatitis C Endocrine Patient has history of Type II Diabetes Denies history of Type I Diabetes Genitourinary Patient has history of End Stage Renal Disease - CKD stage 3 Immunological Denies history of Lupus Erythematosus, Raynaud s, Scleroderma Integumentary (Skin) Denies history of History of Burn, History of pressure wounds Musculoskeletal Patient has history of Gout Denies history of Rheumatoid Arthritis, Osteoarthritis, Osteomyelitis Neurologic Denies history of Dementia, Neuropathy, Quadriplegia, Paraplegia, Seizure Disorder Oncologic Denies history of Received Chemotherapy, Received Radiation Psychiatric Denies history of Anorexia/bulimia, Confinement Anxiety Patient is treated with Insulin, Oral Agents. Medical And Surgical History Notes Ear/Nose/Mouth/Throat HOH Cardiovascular stent placed in right leg 3 weeks ago Review of Systems (ROS) Ear/Nose/Mouth/Throat Denies complaints or  symptoms of Difficult clearing ears, Sinusitis, HOH Hematologic/Lymphatic Denies complaints or symptoms of Bleeding / Clotting Disorders, Human Immunodeficiency Virus. Respiratory Denies complaints or symptoms of Chronic or frequent coughs, Shortness of Breath. Cardiovascular Denies complaints or symptoms of Chest pain, LE edema. Tory, [name] ([id number]) Gastrointestinal Denies complaints or symptoms of Frequent diarrhea, Nausea, Vomiting, GERD Endocrine Complains or has symptoms of Thyroid disease - Atrophy. Denies complaints or symptoms of Hepatitis. Genitourinary Denies complaints or symptoms of Kidney failure/ Dialysis, Incontinence/dribbling. Immunological Denies complaints or symptoms of Hives, Itching. Integumentary (Skin) Complains or has symptoms of Wounds - 1, Bleeding or bruising tendency. Denies complaints or symptoms of  Breakdown, Swelling. Musculoskeletal Denies complaints or symptoms of Muscle Pain, Muscle Weakness. Neurologic Denies complaints or symptoms of Numbness/parasthesias, Focal/Weakness. Psychiatric Denies complaints or symptoms of Anxiety, Claustrophobia. Objective Constitutional patient is hypertensive.. pulse regular and within target range for patient.Marland Kitchen respirations regular, non-labored and within target range for patient.Marland Kitchen temperature within target range for patient.. Well-nourished and well-hydrated in no acute distress. Vitals Time Taken: 10:55 AM, Height: 64 in, Source: Stated, Weight: 130 lbs, Source: Stated, BMI: 22.3, Temperature: 97.7  F, Pulse: 80 bpm, Respiratory Rate: 18 breaths/min, Blood Pressure: 159/60 mmHg. Eyes conjunctiva clear no eyelid edema noted. pupils equal round and reactive to light and accommodation. Ears, Nose, Mouth, and Throat no gross abnormality of ear auricles or external auditory canals. patient has hearing loss. mucus membranes moist. Respiratory normal breathing without difficulty. clear to auscultation  bilaterally. Cardiovascular regular rate and rhythm with normal S1, S2. 1+ dorsalis pedis/posterior tibialis pulses. no clubbing, cyanosis, significant edema, Gastrointestinal (GI) soft, non-tender, non-distended, +BS. no ventral hernia noted. Musculoskeletal Patient unable to walk without assistance. Psychiatric Patient is not able to cooperate in decision making regarding care. Patient is oriented to person only. pleasant and cooperative. Nilsson, Ardra P. ([id number]) General Notes: Upon evaluation patient's wound bed actually shows signs of excellent epithelialization which is great news there does not appear to be signs of active infection which is also great news. Overall I'm very pleased with how things seem to be going even for the first time I've seen her today regarding this wound. She doesn't have any significant pain also good news. No sharp debridement was required at this point. Integumentary (Hair, Skin) Wound #3 status is Open. Original cause of wound was Gradually Appeared. The wound is located on the Left,Proximal,Anterior Lower Leg. The wound measures 1.2cm length x 1.5cm width x 0.1cm depth; 1.414cm^2 area and 0.141cm^3 volume. There is Fat Layer (Subcutaneous Tissue) Exposed exposed. There is no tunneling or undermining noted. There is a medium amount of sanguinous drainage noted. The wound margin is indistinct and nonvisible. There is large (67-100%) red, hyper - granulation within the wound bed. There is no necrotic tissue within the wound bed. The periwound skin appearance exhibited: Hemosiderin Staining. The periwound skin appearance did not exhibit: Callus, Crepitus, Excoriation, Induration, Rash, Scarring, Dry/Scaly, Maceration, Atrophie Blanche, Cyanosis, Ecchymosis, Mottled, Pallor, Rubor, Erythema. Periwound temperature was noted as No Abnormality. Assessment Active Problems ICD-10 Type 2 diabetes mellitus with other skin ulcer Other specified peripheral  vascular diseases Non-pressure chronic ulcer of other part of left lower leg with fat layer exposed Essential (primary) hypertension Conductive hearing loss, bilateral Plan Wound Cleansing: Wound #3 Left,Proximal,Anterior Lower Leg: Clean wound with Normal Saline. May Shower, gently pat wound dry prior to applying new dressing. Anesthetic (add to Medication List): Wound #3 Left,Proximal,Anterior Lower Leg: Topical Lidocaine 4% cream applied to wound bed prior to debridement (In Clinic Only). Primary Wound Dressing: Wound #3 Left,Proximal,Anterior Lower Leg: Silver Collagen - moistened with saline Secondary Dressing: Wound #3 Left,Proximal,Anterior Lower Leg: ABD and Kerlix/Conform Dressing Change Frequency: Wound #3 Left,Proximal,Anterior Lower Leg: Three times weekly Follow-up Appointments: Wound #3 Left,Proximal,Anterior Lower Leg: Return Appointment in 2 weeks. Additional Orders / Instructions: XAVIA, KNISKERN. ([id number]) Wound #3 Left,Proximal,Anterior Lower Leg: Other: - Orders sent to [facility] [place] My suggestion currently is gonna be that we initiate treatment with Prisma which I think will help to allow the area to heal as far as her wounds are concerned. The patient is in agreement with plan as is her  family member who was present during the evaluation today. We will subsequently see were things stand at follow-up. If anything changes or worsens in the meantime she will contact the office or rather have family do so. Please see above for specific wound care orders. We will see patient for re-evaluation in 1 week(s) here in the clinic. If anything worsens or changes patient will contact our office for additional recommendations. Electronic Signature(s) Signed: 10/09/2018 5:10:17 PM By: Worthy Keeler PA-C Entered By: Worthy Keeler on 10/09/2018 17:01:44 Russ, Eloisa P.  ([id number]) -------------------------------------------------------------------------------- ROS/PFSH Details Patient Name: Luhmann, Dehlia P. Date of Service: 10/09/2018 10:45 AM Medical Record Number: [id number] Patient Account Number: [id number] Date of Birth/Sex: May 07, 1919 (85 y.o. F) Treating RN: Harold Barban Primary Care Provider: Frazier Richards Other Clinician: Referring Provider: Frazier Richards Treating Provider/Extender: Melburn Hake, HOYT Weeks in Treatment: 0 Information Obtained From Patient Ear/Nose/Mouth/Throat Complaints and Symptoms: Negative for: Difficult clearing ears; Sinusitis Review of System Notes: HOH Medical History: Negative for: Chronic sinus problems/congestion; Middle ear problems Past Medical History Notes: HOH Hematologic/Lymphatic Complaints and Symptoms: Negative for: Bleeding / Clotting Disorders; Human Immunodeficiency Virus Medical History: Positive for: Anemia Negative for: Hemophilia; Human Immunodeficiency Virus; Lymphedema; Sickle Cell Disease Respiratory Complaints and Symptoms: Negative for: Chronic or frequent coughs; Shortness of Breath Medical History: Negative for: Aspiration; Asthma; Chronic Obstructive Pulmonary Disease (COPD); Pneumothorax; Sleep Apnea; Tuberculosis Cardiovascular Complaints and Symptoms: Negative for: Chest pain; LE edema Medical History: Positive for: Hypertension; Peripheral Arterial Disease Negative for: Angina; Arrhythmia; Congestive Heart Failure; Coronary Artery Disease; Deep Vein Thrombosis; Hypotension; Myocardial Infarction; Peripheral Venous Disease; Phlebitis; Vasculitis Past Medical History Notes: stent placed in right leg 3 weeks ago Gastrointestinal Complaints and Symptoms: Negative for: Frequent diarrhea; Nausea; Vomiting Review of System Notes: GERD Hanrahan, Aarohi P. ([id number]) Medical History: Negative for: Cirrhosis ; Colitis; Crohnos; Hepatitis A; Hepatitis B; Hepatitis  C Endocrine Complaints and Symptoms: Positive for: Thyroid disease - Atrophy Negative for: Hepatitis Medical History: Positive for: Type II Diabetes Negative for: Type I Diabetes Time with diabetes: 10 weeks Treated with: Insulin, Oral agents Genitourinary Complaints and Symptoms: Negative for: Kidney failure/ Dialysis; Incontinence/dribbling Medical History: Positive for: End Stage Renal Disease - CKD stage 3 Immunological Complaints and Symptoms: Negative for: Hives; Itching Medical History: Negative for: Lupus Erythematosus; Raynaudos; Scleroderma Integumentary (Skin) Complaints and Symptoms: Positive for: Wounds - 1; Bleeding or bruising tendency Negative for: Breakdown; Swelling Medical History: Negative for: History of Burn; History of pressure wounds Musculoskeletal Complaints and Symptoms: Negative for: Muscle Pain; Muscle Weakness Medical History: Positive for: Gout Negative for: Rheumatoid Arthritis; Osteoarthritis; Osteomyelitis Neurologic Complaints and Symptoms: Negative for: Numbness/parasthesias; Focal/Weakness Medical History: Negative for: Dementia; Neuropathy; Quadriplegia; Paraplegia; Seizure Disorder Dunne, Sereen P. ([id number]) Psychiatric Complaints and Symptoms: Negative for: Anxiety; Claustrophobia Medical History: Negative for: Anorexia/bulimia; Confinement Anxiety Eyes Medical History: Positive for: Glaucoma Negative for: Cataracts; Optic Neuritis Oncologic Medical History: Negative for: Received Chemotherapy; Received Radiation HBO Extended History Items Eyes: Glaucoma Immunizations Pneumococcal Vaccine: Received Pneumococcal Vaccination: Yes Implantable Devices None Family and Social History Never smoker; Marital Status - Widowed; Alcohol Use: Never; Drug Use: No History; Caffeine Use: Moderate; Financial Concerns: No; Food, Clothing or Shelter Needs: No; Support System Lacking: No; Transportation Concerns: No Electronic  Signature(s) Signed: 10/09/2018 3:33:18 PM By: Harold Barban Signed: 10/09/2018 5:10:17 PM By: Worthy Keeler PA-C Entered By: Harold Barban on 10/09/2018 11:04:55 Whittington, Shamyra P. ([id number]) -------------------------------------------------------------------------------- SuperBill Details Patient Name: Adolf, Aayliah P. Date of Service: 10/09/2018 Medical Record Number:  [id number] Patient Account Number: [id number] Date of Birth/Sex: [birth year] (85 y.o. F) Treating RN: Cornell Barman Primary Care Provider: Frazier Richards Other Clinician: Referring Provider: Frazier Richards Treating Provider/Extender: Melburn Hake, HOYT Weeks in Treatment: 0 Diagnosis Coding ICD-10 Codes Code Description E11.622 Type 2 diabetes mellitus with other skin ulcer I73.89 Other specified peripheral vascular diseases L97.822 Non-pressure chronic ulcer of other part of left lower leg with fat layer exposed I10 Essential (primary) hypertension H90.0 Conductive hearing loss, bilateral Facility Procedures CPT4 Code: 38333832 Description: 99214 - WOUND CARE VISIT-LEV 4 EST PT Modifier: Quantity: 1 Physician Procedures CPT4 Code Description: 9191660 99214 - WC PHYS LEVEL 4 - EST PT ICD-10 Diagnosis Description E11.622 Type 2 diabetes mellitus with other skin ulcer I73.89 Other specified peripheral vascular diseases L97.822 Non-pressure chronic ulcer of other part of  left lower leg wit I10 Essential (primary) hypertension Modifier: h fat layer expos Quantity: 1 ed Electronic Signature(s) Signed: 10/09/2018 5:10:17 PM By: Worthy Keeler PA-C Entered By: Worthy Keeler on 10/09/2018 17:01:59

## 2018-10-09 NOTE — Progress Notes (Signed)
DELPHA, PERKO ([id number]) Visit Report for 10/09/2018 Allergy List Details Patient Name: Ashley [name], [name] Date of Service: 10/09/2018 10:45 AM Medical Record Number: [id number] Patient Account Number: [id number] Date of Birth/Sex: April 26, 1919 (85 y.o. F) Treating RN: Harold Barban Primary Care Malkia Nippert: Frazier Richards Other Clinician: Referring Elmina Hendel: Frazier Richards Treating Kayen Grabel/Extender: STONE III, HOYT Weeks in Treatment: 0 Allergies Active Allergies No Known Allergies Allergy Notes Electronic Signature(s) Signed: 10/09/2018 3:33:18 PM By: Harold Barban Entered By: Harold Barban on 10/09/2018 10:58:45 Saleeby, Katriana P. ([id number]) -------------------------------------------------------------------------------- Arrival Information Details Patient Name: Ashley [name], [name] Date of Service: 10/09/2018 10:45 AM Medical Record Number: [id number] Patient Account Number: [id number] Date of Birth/Sex: [birth year] (85 y.o. F) Treating RN: Harold Barban Primary Care Aniza Shor: Frazier Richards Other Clinician: Referring Florina Glas: Frazier Richards Treating Eian Vandervelden/Extender: Melburn Hake, HOYT Weeks in Treatment: 0 Visit Information Patient Arrived: Wheel Chair Arrival Time: 10:54 Accompanied By: son Transfer Assistance: Manual Patient Identification Verified: Yes Secondary Verification Process Yes Completed: Patient Has Alerts: Yes Patient Alerts: aspirin 81 ABI 04/29/18 L [place] R 1.24 TBI L .53 R .34 History Since Last Visit Added or deleted any medications: No Any new allergies or adverse reactions: No Had a fall or experienced change in activities of daily living that may affect risk of falls: No Signs or symptoms of abuse/neglect since last visito No Hospitalized since last visit: No Has Dressing in Place as Prescribed: Yes Has Compression in Place as Prescribed: No Electronic Signature(s) Signed: 10/09/2018 11:09:06 AM By: Montey Hora Entered By: Montey Hora  on 10/09/2018 11:09:06 Bieri, [place]. ([id number]) -------------------------------------------------------------------------------- Clinic Level of Care Assessment Details Patient Name: Ashley [name], [name] Date of Service: 10/09/2018 10:45 AM Medical Record Number: [id number] Patient Account Number: [id number] Date of Birth/Sex: October 28, 1918 (85 y.o. F) Treating RN: Cornell Barman Primary Care Ameya Kutz: Frazier Richards Other Clinician: Referring Kristoff Coonradt: Frazier Richards Treating Deiondra Denley/Extender: Melburn Hake, HOYT Weeks in Treatment: 0 Clinic Level of Care Assessment Items TOOL 2 Quantity Score []  - Use when only an EandM is performed on the INITIAL visit 0 ASSESSMENTS - Nursing Assessment / Reassessment X - General Physical Exam (combine w/ comprehensive assessment (listed just below) when 1 20 performed on new pt. evals) X- 1 25 Comprehensive Assessment (HX, ROS, Risk Assessments, Wounds Hx, etc.) ASSESSMENTS - Wound and Skin Assessment / Reassessment X - Simple Wound Assessment / Reassessment - one wound 1 5 []  - 0 Complex Wound Assessment / Reassessment - multiple wounds []  - 0 Dermatologic / Skin Assessment (not related to wound area) ASSESSMENTS - Ostomy and/or Continence Assessment and Care []  - Incontinence Assessment and Management 0 []  - 0 Ostomy Care Assessment and Management (repouching, etc.) PROCESS - Coordination of Care X - Simple Patient / Family Education for ongoing care 1 15 []  - 0 Complex (extensive) Patient / Family Education for ongoing care X- 1 10 Staff obtains Programmer, systems, Records, Test Results / Process Orders []  - 0 Staff telephones HHA, Nursing Homes / Clarify orders / etc []  - 0 Routine Transfer to another Facility (non-emergent condition) []  - 0 Routine Hospital Admission (non-emergent condition) X- 1 15 New Admissions / Biomedical engineer / Ordering NPWT, Apligraf, etc. []  - 0 Emergency Hospital Admission (emergent condition) X- 1 10 Simple  Discharge Coordination []  - 0 Complex (extensive) Discharge Coordination PROCESS - Special Needs []  - Pediatric / Minor Patient Management 0 []  - 0 Isolation Patient Management Thoma, Kearra P. ([id number]) []  - 0 Hearing / Language / Visual special needs []  - 0 Assessment  of Community assistance (transportation, D/C planning, etc.) []  - 0 Additional assistance / Altered mentation []  - 0 Support Surface(s) Assessment (bed, cushion, seat, etc.) INTERVENTIONS - Wound Cleansing / Measurement X - Wound Imaging (photographs - any number of wounds) 1 5 []  - 0 Wound Tracing (instead of photographs) X- 1 5 Simple Wound Measurement - one wound []  - 0 Complex Wound Measurement - multiple wounds X- 1 5 Simple Wound Cleansing - one wound []  - 0 Complex Wound Cleansing - multiple wounds INTERVENTIONS - Wound Dressings []  - Small Wound Dressing one or multiple wounds 0 X- 1 15 Medium Wound Dressing one or multiple wounds []  - 0 Large Wound Dressing one or multiple wounds []  - 0 Application of Medications - injection INTERVENTIONS - Miscellaneous []  - External ear exam 0 []  - 0 Specimen Collection (cultures, biopsies, blood, body fluids, etc.) []  - 0 Specimen(s) / Culture(s) sent or taken to Lab for analysis []  - 0 Patient Transfer (multiple staff / Civil Service fast streamer / Similar devices) []  - 0 Simple Staple / Suture removal (25 or less) []  - 0 Complex Staple / Suture removal (26 or more) []  - 0 Hypo / Hyperglycemic Management (close monitor of Blood Glucose) []  - 0 Ankle / Brachial Index (ABI) - do not check if billed separately Has the patient been seen at the hospital within the last three years: Yes Total Score: 130 Level Of Care: New/Established - Level 4 Electronic Signature(s) Signed: 10/09/2018 4:49:14 PM By: Gretta Cool, BSN, RN, CWS, Kim RN, BSN Entered By: Gretta Cool, BSN, RN, CWS, Kim on 10/09/2018 11:27:03 Laroque, Karcyn P.  ([id number]) -------------------------------------------------------------------------------- Encounter Discharge Information Details Patient Name: Ashley [name], [name] Date of Service: 10/09/2018 10:45 AM Medical Record Number: [id number] Patient Account Number: [id number] Date of Birth/Sex: 1918-06-16 (85 y.o. F) Treating RN: Harold Barban Primary Care Jabier Deese: Frazier Richards Other Clinician: Referring Faven Watterson: Frazier Richards Treating Henli Hey/Extender: Melburn Hake, HOYT Weeks in Treatment: 0 Encounter Discharge Information Items Discharge Condition: Stable Ambulatory Status: Wheelchair Discharge Destination: Home Transportation: Private Auto Accompanied By: niece/nephew Schedule Follow-up Appointment: Yes Clinical Summary of Care: Electronic Signature(s) Signed: 10/09/2018 11:45:47 AM By: Harold Barban Entered By: Harold Barban on 10/09/2018 11:45:46 Homes, Clydie P. ([id number]) -------------------------------------------------------------------------------- Lower Extremity Assessment Details Patient Name: Ashley [name], [name] Date of Service: 10/09/2018 10:45 AM Medical Record Number: [id number] Patient Account Number: [id number] Date of Birth/Sex: 1918-11-11 (85 y.o. F) Treating RN: Montey Hora Primary Care Leslee Haueter: Frazier Richards Other Clinician: Referring Joshawa Dubin: Frazier Richards Treating Hilbert Briggs/Extender: Melburn Hake, HOYT Weeks in Treatment: 0 Edema Assessment Assessed: [Left: No] [Right: No] Edema: [Left: No] [Right: No] Calf Left: Right: Point of Measurement: 30 cm From Medial Instep 32 cm 31.5 cm Ankle Left: Right: Point of Measurement: 10 cm From Medial Instep 20 cm 19.5 cm Vascular Assessment Pulses: Dorsalis Pedis Palpable: [Left:Yes] [Right:Yes] Posterior Tibial Palpable: [Left:Yes] [Right:Yes] Notes ABI at [facility] 04/29/2018 Electronic Signature(s) Signed: 10/09/2018 11:07:47 AM By: Montey Hora Entered By: Montey Hora on 10/09/2018  11:07:46 Ailey, Sumire P. ([id number]) -------------------------------------------------------------------------------- Multi Wound Chart Details Patient Name: Ashley [name], [name] Date of Service: 10/09/2018 10:45 AM Medical Record Number: [id number] Patient Account Number: [id number] Date of Birth/Sex: May 03, 1919 (85 y.o. F) Treating RN: Cornell Barman Primary Care Sindhu Nguyen: Frazier Richards Other Clinician: Referring Zaylan Kissoon: Frazier Richards Treating Kathleena Freeman/Extender: Melburn Hake, HOYT Weeks in Treatment: 0 Vital Signs Height(in): 64 Pulse(bpm): 80 Weight(lbs): 130 Blood Pressure(mmHg): 159/60 Body Mass Index(BMI): 22 Temperature(F): 97.7 Respiratory Rate 18 (breaths/min): Photos: [N/A:N/A] Wound Location: Left Lower Leg - Anterior,  N/A N/A Proximal Wounding Event: Gradually Appeared N/A N/A Primary Etiology: To be determined N/A N/A Comorbid History: Glaucoma, Anemia, N/A N/A Hypertension, Peripheral Arterial Disease, Type II Diabetes, End Stage Renal Disease, Gout Date Acquired: 08/11/2018 N/A N/A Weeks of Treatment: 0 N/A N/A Wound Status: Open N/A N/A Measurements L x W x D 1.2x1.5x0.1 N/A N/A (cm) Area (cm) : 1.414 N/A N/A Volume (cm) : 0.141 N/A N/A Classification: Full Thickness Without N/A N/A Exposed Support Structures Exudate Amount: Medium N/A N/A Exudate Type: Sanguinous N/A N/A Exudate Color: red N/A N/A Wound Margin: Indistinct, nonvisible N/A N/A Granulation Amount: Large (67-100%) N/A N/A Granulation Quality: Red, Hyper-granulation N/A N/A Necrotic Amount: None Present (0%) N/A N/A Exposed Structures: Fat Layer (Subcutaneous N/A N/A Tissue) Exposed: Yes Fascia: No Tendon: No Hinger, Adilene P. ([id number]) Muscle: No Joint: No Bone: No Epithelialization: Medium (34-66%) N/A N/A Periwound Skin Texture: Excoriation: No N/A N/A Induration: No Callus: No Crepitus: No Rash: No Scarring: No Periwound Skin Moisture: Maceration: No N/A  N/A Dry/Scaly: No Periwound Skin Color: Hemosiderin Staining: Yes N/A N/A Atrophie Blanche: No Cyanosis: No Ecchymosis: No Erythema: No Mottled: No Pallor: No Rubor: No Temperature: No Abnormality N/A N/A Tenderness on Palpation: No N/A N/A Treatment Notes Electronic Signature(s) Signed: 10/09/2018 4:49:14 PM By: Gretta Cool, BSN, RN, CWS, Kim RN, BSN Entered By: Gretta Cool, BSN, RN, CWS, Kim on 10/09/2018 11:23:15 Palen, Amarilys P. ([id number]) -------------------------------------------------------------------------------- [place] Details Patient Name: Ashley [name], [name] Date of Service: 10/09/2018 10:45 AM Medical Record Number: [id number] Patient Account Number: [id number] Date of Birth/Sex: [birth year] (85 y.o. F) Treating RN: Cornell Barman Primary Care Chere Babson: Frazier Richards Other Clinician: Referring Natausha Jungwirth: Frazier Richards Treating Leota Maka/Extender: Melburn Hake, HOYT Weeks in Treatment: 0 Active Inactive Abuse / Safety / Falls / Self Care Management Nursing Diagnoses: Potential for falls Goals: Patient/caregiver will verbalize understanding of skin care regimen Date Initiated: 10/09/2018 Target Resolution Date: 10/22/2018 Goal Status: Active Patient/caregiver will verbalize/demonstrate measures taken to prevent injury and/or falls Date Initiated: 10/09/2018 Target Resolution Date: 10/22/2018 Goal Status: Active Interventions: Assess fall risk on admission and as needed Notes: Nutrition Nursing Diagnoses: Imbalanced nutrition Goals: Patient/caregiver verbalizes understanding of need to maintain therapeutic glucose control per primary care physician Date Initiated: 10/09/2018 Target Resolution Date: 10/22/2018 Goal Status: Active Interventions: Provide education on elevated blood sugars and impact on wound healing Provide education on nutrition Notes: Orientation to the Wound Care Program Nursing Diagnoses: Knowledge deficit related to the wound healing  center program Goals: Patient/caregiver will verbalize understanding of the [facility] Program Date Initiated: 10/09/2018 Target Resolution Date: 10/22/2018 JEANEAN, HOLLETT ([id number]) Goal Status: Active Interventions: Provide education on orientation to the wound center Notes: Wound/Skin Impairment Nursing Diagnoses: Impaired tissue integrity Goals: Patient/caregiver will verbalize understanding of skin care regimen Date Initiated: 10/09/2018 Target Resolution Date: 10/22/2018 Goal Status: Active Interventions: Assess ulceration(s) every visit Treatment Activities: Topical wound management initiated : 10/09/2018 Notes: Electronic Signature(s) Signed: 10/09/2018 4:49:14 PM By: Gretta Cool, BSN, RN, CWS, Kim RN, BSN Entered By: Gretta Cool, BSN, RN, CWS, Kim on 10/09/2018 11:22:53 Endres, Renley P. ([id number]) -------------------------------------------------------------------------------- Pain Assessment Details Patient Name: Ashley [name], [name] Date of Service: 10/09/2018 10:45 AM Medical Record Number: [id number] Patient Account Number: [id number] Date of Birth/Sex: 1918-11-23 (85 y.o. F) Treating RN: Harold Barban Primary Care Rin Gorton: Frazier Richards Other Clinician: Referring Diego Ulbricht: Frazier Richards Treating Gurtha Picker/Extender: Melburn Hake, HOYT Weeks in Treatment: 0 Active Problems Location of Pain Severity and Description of Pain Patient Has Paino No Site Locations Pain  Management and Medication Current Pain Management: Electronic Signature(s) Signed: 10/09/2018 3:33:18 PM By: Harold Barban Entered By: Harold Barban on 10/09/2018 10:55:57 Ashley [name], [name] ([id number]) -------------------------------------------------------------------------------- Patient/Caregiver Education Details Patient Name: Ashley [name], [name] Date of Service: 10/09/2018 10:45 AM Medical Record Number: [id number] Patient Account Number: [id number] Date of Birth/Gender: 1918/05/31 (85 y.o. F) Treating  RN: Cornell Barman Primary Care Physician: Frazier Richards Other Clinician: Referring Physician: Frazier Richards Treating Physician/Extender: Melburn Hake, HOYT Weeks in Treatment: 0 Education Assessment Education Provided To: Patient and Caregiver Education Topics Provided Wound/Skin Impairment: Handouts: Caring for Your Ulcer, Other: wound care as prescribed Methods: Demonstration, Explain/Verbal Responses: State content correctly Electronic Signature(s) Signed: 10/09/2018 4:49:14 PM By: Gretta Cool, BSN, RN, CWS, Kim RN, BSN Entered By: Gretta Cool, BSN, RN, CWS, Kim on 10/09/2018 11:27:58 Ashley [name], [name] ([id number]) -------------------------------------------------------------------------------- Wound Assessment Details Patient Name: Ashley [name], [name] Date of Service: 10/09/2018 10:45 AM Medical Record Number: [id number] Patient Account Number: [id number] Date of Birth/Sex: 31-Aug-1918 (85 y.o. F) Treating RN: Montey Hora Primary Care Rolin Schult: Frazier Richards Other Clinician: Referring Quintin Hjort: Frazier Richards Treating Glynn Yepes/Extender: Melburn Hake, HOYT Weeks in Treatment: 0 Wound Status Wound Number: 3 Primary To be determined Etiology: Wound Location: Left Lower Leg - Anterior, Proximal Wound Open Wounding Event: Gradually Appeared Status: Date Acquired: 08/11/2018 Comorbid Glaucoma, Anemia, Hypertension, Peripheral Weeks Of Treatment: 0 History: Arterial Disease, Type II Diabetes, End Stage Clustered Wound: No Renal Disease, Gout Photos Wound Measurements Length: (cm) 1.2 Width: (cm) 1.5 Depth: (cm) 0.1 Area: (cm) 1.414 Volume: (cm) 0.141 % Reduction in Area: % Reduction in Volume: Epithelialization: Medium (34-66%) Tunneling: No Undermining: No Wound Description Full Thickness Without Exposed Support Classification: Structures Wound Margin: Indistinct, nonvisible Exudate Medium Amount: Exudate Type: Sanguinous Exudate Color: red Foul Odor After  Cleansing: No Slough/Fibrino No Wound Bed Granulation Amount: Large (67-100%) Exposed Structure Granulation Quality: Red, Hyper-granulation Fascia Exposed: No Necrotic Amount: None Present (0%) Fat Layer (Subcutaneous Tissue) Exposed: Yes Tendon Exposed: No Muscle Exposed: No Joint Exposed: No Bone Exposed: No Ashley [name], Ashley [name] P. ([id number]) Periwound Skin Texture Texture Color No Abnormalities Noted: No No Abnormalities Noted: No Callus: No Atrophie Blanche: No Crepitus: No Cyanosis: No Excoriation: No Ecchymosis: No Induration: No Erythema: No Rash: No Hemosiderin Staining: Yes Scarring: No Mottled: No Pallor: No Moisture Rubor: No No Abnormalities Noted: No Dry / Scaly: No Temperature / Pain Maceration: No Temperature: No Abnormality Treatment Notes Wound #3 (Left, Proximal, Anterior Lower Leg) Notes Silver collagen, ABD, Conform, Stretchy or patients compression hose Electronic Signature(s) Signed: 10/09/2018 11:12:09 AM By: Montey Hora Entered By: Montey Hora on 10/09/2018 11:12:09 Redd, Sakara P. ([id number]) -------------------------------------------------------------------------------- Vitals Details Patient Name: Hayton, Ellieanna P. Date of Service: 10/09/2018 10:45 AM Medical Record Number: [id number] Patient Account Number: [id number] Date of Birth/Sex: November 23, 1918 (85 y.o. F) Treating RN: Harold Barban Primary Care Emmilee Reamer: Frazier Richards Other Clinician: Referring Anahli Arvanitis: Frazier Richards Treating Laron Boorman/Extender: Melburn Hake, HOYT Weeks in Treatment: 0 Vital Signs Time Taken: 10:55 Temperature (F): 97.7 Height (in): 64 Pulse (bpm): 80 Source: Stated Respiratory Rate (breaths/min): 18 Weight (lbs): 130 Blood Pressure (mmHg): 159/60 Source: Stated Reference Range: 80 - 120 mg / dl Body Mass Index (BMI): 22.3 Electronic Signature(s) Signed: 10/09/2018 3:33:18 PM By: Harold Barban Entered By: Harold Barban on 10/09/2018 10:58:07

## 2018-10-10 DIAGNOSIS — R279 Unspecified lack of coordination: Secondary | ICD-10-CM | POA: Diagnosis not present

## 2018-10-10 DIAGNOSIS — Z89421 Acquired absence of other right toe(s): Secondary | ICD-10-CM | POA: Diagnosis not present

## 2018-10-10 DIAGNOSIS — R41841 Cognitive communication deficit: Secondary | ICD-10-CM | POA: Diagnosis not present

## 2018-10-10 DIAGNOSIS — E1151 Type 2 diabetes mellitus with diabetic peripheral angiopathy without gangrene: Secondary | ICD-10-CM | POA: Diagnosis not present

## 2018-10-10 DIAGNOSIS — H409 Unspecified glaucoma: Secondary | ICD-10-CM | POA: Diagnosis not present

## 2018-10-13 DIAGNOSIS — E1151 Type 2 diabetes mellitus with diabetic peripheral angiopathy without gangrene: Secondary | ICD-10-CM | POA: Diagnosis not present

## 2018-10-13 DIAGNOSIS — H409 Unspecified glaucoma: Secondary | ICD-10-CM | POA: Diagnosis not present

## 2018-10-13 DIAGNOSIS — R41841 Cognitive communication deficit: Secondary | ICD-10-CM | POA: Diagnosis not present

## 2018-10-13 DIAGNOSIS — R279 Unspecified lack of coordination: Secondary | ICD-10-CM | POA: Diagnosis not present

## 2018-10-13 DIAGNOSIS — Z89421 Acquired absence of other right toe(s): Secondary | ICD-10-CM | POA: Diagnosis not present

## 2018-10-15 DIAGNOSIS — Z89421 Acquired absence of other right toe(s): Secondary | ICD-10-CM | POA: Diagnosis not present

## 2018-10-15 DIAGNOSIS — R279 Unspecified lack of coordination: Secondary | ICD-10-CM | POA: Diagnosis not present

## 2018-10-15 DIAGNOSIS — R41841 Cognitive communication deficit: Secondary | ICD-10-CM | POA: Diagnosis not present

## 2018-10-15 DIAGNOSIS — E1151 Type 2 diabetes mellitus with diabetic peripheral angiopathy without gangrene: Secondary | ICD-10-CM | POA: Diagnosis not present

## 2018-10-15 DIAGNOSIS — H409 Unspecified glaucoma: Secondary | ICD-10-CM | POA: Diagnosis not present

## 2018-10-17 DIAGNOSIS — R279 Unspecified lack of coordination: Secondary | ICD-10-CM | POA: Diagnosis not present

## 2018-10-17 DIAGNOSIS — H409 Unspecified glaucoma: Secondary | ICD-10-CM | POA: Diagnosis not present

## 2018-10-17 DIAGNOSIS — R41841 Cognitive communication deficit: Secondary | ICD-10-CM | POA: Diagnosis not present

## 2018-10-17 DIAGNOSIS — E1151 Type 2 diabetes mellitus with diabetic peripheral angiopathy without gangrene: Secondary | ICD-10-CM | POA: Diagnosis not present

## 2018-10-17 DIAGNOSIS — Z89421 Acquired absence of other right toe(s): Secondary | ICD-10-CM | POA: Diagnosis not present

## 2018-10-20 DIAGNOSIS — R41841 Cognitive communication deficit: Secondary | ICD-10-CM | POA: Diagnosis not present

## 2018-10-20 DIAGNOSIS — H409 Unspecified glaucoma: Secondary | ICD-10-CM | POA: Diagnosis not present

## 2018-10-20 DIAGNOSIS — E1151 Type 2 diabetes mellitus with diabetic peripheral angiopathy without gangrene: Secondary | ICD-10-CM | POA: Diagnosis not present

## 2018-10-20 DIAGNOSIS — R279 Unspecified lack of coordination: Secondary | ICD-10-CM | POA: Diagnosis not present

## 2018-10-20 DIAGNOSIS — Z89421 Acquired absence of other right toe(s): Secondary | ICD-10-CM | POA: Diagnosis not present

## 2018-10-21 DIAGNOSIS — R279 Unspecified lack of coordination: Secondary | ICD-10-CM | POA: Diagnosis not present

## 2018-10-21 DIAGNOSIS — Z89421 Acquired absence of other right toe(s): Secondary | ICD-10-CM | POA: Diagnosis not present

## 2018-10-21 DIAGNOSIS — H409 Unspecified glaucoma: Secondary | ICD-10-CM | POA: Diagnosis not present

## 2018-10-21 DIAGNOSIS — R41841 Cognitive communication deficit: Secondary | ICD-10-CM | POA: Diagnosis not present

## 2018-10-21 DIAGNOSIS — E1151 Type 2 diabetes mellitus with diabetic peripheral angiopathy without gangrene: Secondary | ICD-10-CM | POA: Diagnosis not present

## 2018-10-22 DIAGNOSIS — E1151 Type 2 diabetes mellitus with diabetic peripheral angiopathy without gangrene: Secondary | ICD-10-CM | POA: Diagnosis not present

## 2018-10-22 DIAGNOSIS — Z89421 Acquired absence of other right toe(s): Secondary | ICD-10-CM | POA: Diagnosis not present

## 2018-10-22 DIAGNOSIS — R279 Unspecified lack of coordination: Secondary | ICD-10-CM | POA: Diagnosis not present

## 2018-10-22 DIAGNOSIS — R41841 Cognitive communication deficit: Secondary | ICD-10-CM | POA: Diagnosis not present

## 2018-10-22 DIAGNOSIS — H409 Unspecified glaucoma: Secondary | ICD-10-CM | POA: Diagnosis not present

## 2018-10-23 ENCOUNTER — Encounter: Payer: Medicare Other | Admitting: Physician Assistant

## 2018-10-23 ENCOUNTER — Other Ambulatory Visit: Payer: Self-pay

## 2018-10-23 DIAGNOSIS — R41841 Cognitive communication deficit: Secondary | ICD-10-CM | POA: Diagnosis not present

## 2018-10-23 DIAGNOSIS — L97821 Non-pressure chronic ulcer of other part of left lower leg limited to breakdown of skin: Secondary | ICD-10-CM | POA: Diagnosis not present

## 2018-10-23 DIAGNOSIS — I132 Hypertensive heart and chronic kidney disease with heart failure and with stage 5 chronic kidney disease, or end stage renal disease: Secondary | ICD-10-CM | POA: Diagnosis not present

## 2018-10-23 DIAGNOSIS — H409 Unspecified glaucoma: Secondary | ICD-10-CM | POA: Diagnosis not present

## 2018-10-23 DIAGNOSIS — H900 Conductive hearing loss, bilateral: Secondary | ICD-10-CM | POA: Diagnosis not present

## 2018-10-23 DIAGNOSIS — Z89421 Acquired absence of other right toe(s): Secondary | ICD-10-CM | POA: Diagnosis not present

## 2018-10-23 DIAGNOSIS — E11622 Type 2 diabetes mellitus with other skin ulcer: Secondary | ICD-10-CM | POA: Diagnosis not present

## 2018-10-23 DIAGNOSIS — L97212 Non-pressure chronic ulcer of right calf with fat layer exposed: Secondary | ICD-10-CM | POA: Diagnosis not present

## 2018-10-23 DIAGNOSIS — R279 Unspecified lack of coordination: Secondary | ICD-10-CM | POA: Diagnosis not present

## 2018-10-23 DIAGNOSIS — E1151 Type 2 diabetes mellitus with diabetic peripheral angiopathy without gangrene: Secondary | ICD-10-CM | POA: Diagnosis not present

## 2018-10-23 DIAGNOSIS — I7389 Other specified peripheral vascular diseases: Secondary | ICD-10-CM | POA: Diagnosis not present

## 2018-10-23 DIAGNOSIS — L97822 Non-pressure chronic ulcer of other part of left lower leg with fat layer exposed: Secondary | ICD-10-CM | POA: Diagnosis not present

## 2018-10-24 DIAGNOSIS — E1151 Type 2 diabetes mellitus with diabetic peripheral angiopathy without gangrene: Secondary | ICD-10-CM | POA: Diagnosis not present

## 2018-10-24 DIAGNOSIS — Z89421 Acquired absence of other right toe(s): Secondary | ICD-10-CM | POA: Diagnosis not present

## 2018-10-24 DIAGNOSIS — H409 Unspecified glaucoma: Secondary | ICD-10-CM | POA: Diagnosis not present

## 2018-10-24 DIAGNOSIS — R41841 Cognitive communication deficit: Secondary | ICD-10-CM | POA: Diagnosis not present

## 2018-10-24 DIAGNOSIS — R279 Unspecified lack of coordination: Secondary | ICD-10-CM | POA: Diagnosis not present

## 2018-10-27 ENCOUNTER — Encounter: Payer: Self-pay | Admitting: Adult Health

## 2018-10-27 ENCOUNTER — Non-Acute Institutional Stay (SKILLED_NURSING_FACILITY): Payer: Medicare Other | Admitting: Adult Health

## 2018-10-27 ENCOUNTER — Other Ambulatory Visit
Admission: RE | Admit: 2018-10-27 | Discharge: 2018-10-27 | Disposition: A | Discharge status: Home / Self Care | Payer: Medicare Other | Source: Ambulatory Visit | Attending: Adult Health | Admitting: Adult Health

## 2018-10-27 ENCOUNTER — Encounter
Admission: RE | Admit: 2018-10-27 | Discharge: 2018-10-27 | Disposition: A | Discharge status: Home / Self Care | Payer: Medicare Other | Source: Ambulatory Visit | Attending: Internal Medicine | Admitting: Internal Medicine

## 2018-10-27 DIAGNOSIS — G4709 Other insomnia: Secondary | ICD-10-CM

## 2018-10-27 DIAGNOSIS — Z89421 Acquired absence of other right toe(s): Secondary | ICD-10-CM | POA: Diagnosis not present

## 2018-10-27 DIAGNOSIS — R41841 Cognitive communication deficit: Secondary | ICD-10-CM | POA: Diagnosis not present

## 2018-10-27 DIAGNOSIS — R279 Unspecified lack of coordination: Secondary | ICD-10-CM | POA: Diagnosis not present

## 2018-10-27 DIAGNOSIS — E034 Atrophy of thyroid (acquired): Secondary | ICD-10-CM | POA: Diagnosis not present

## 2018-10-27 DIAGNOSIS — H409 Unspecified glaucoma: Secondary | ICD-10-CM | POA: Diagnosis not present

## 2018-10-27 DIAGNOSIS — E1151 Type 2 diabetes mellitus with diabetic peripheral angiopathy without gangrene: Secondary | ICD-10-CM | POA: Diagnosis not present

## 2018-10-27 LAB — TSH: TSH: 0.241 u[IU]/mL — ABNORMAL LOW (ref 0.350–4.500)

## 2018-10-27 NOTE — Progress Notes (Signed)
[facility]  [facility] Room Number: 086P Place of Service:  SNF ([phone]) Provider:  Durenda Age, DNP, FNP-BC  Patient Care Team: Kirk Ruths, MD as PCP - General (Internal Medicine) Nyoka Cowden Phylis Bougie, NP as Nurse Practitioner (Geriatric Medicine)  Extended Emergency Contact Information Primary Emergency Contact: [place] of [place] Phone: [phone] Relation: Niece Secondary Emergency Contact: Charolotte Eke [place] of [place] Phone: [phone] Relation: Nephew  Code Status:  DNR  Goals of care: Advanced Directive information Advanced Directives 10/27/2018  Does Patient Have a Medical Advance Directive? Yes  Type of Paramedic of [place];Living will;Out of facility DNR (pink MOST or yellow form)  Does patient want to make changes to medical advance directive? No - Patient declined  Copy of [place] in Chart? Yes - validated most recent copy scanned in chart (See row information)  Pre-existing out of facility DNR order (yellow form or pink MOST form) Yellow form placed in chart (order not valid for inpatient use)     Chief Complaint  Patient presents with  . Acute Visit    low TSH    HPI:  Pt is a 85 y.o. female seen today for low elevated tsh, 0.241. She denies having palpitations. She is currently taking Levothyroxine for hypothyroidism. She verbalized having enough sleep at night. She currently take Trazodone for insomnia. She has PMH of CAD, CKD, DM2, GERD, HTN and stroke.   Past Medical History:  Diagnosis Date  . Acute gouty arthritis 11/21/2016  . Aortic atherosclerosis ([place]) 09/04/2016  . Carotid artery disease ([place]) 11/21/2016  . Carotid stenosis 12/03/2015   Advanced heavily calcified atherosclerotic disease at both carotid bifurcations. Stenoses measured at 70% affecting the right proximal ICA and 80% affecting the left proximal ICA.  Marland Kitchen  Chronic kidney disease   . Coronary atherosclerosis 09/04/2016  . Diverticulitis   . DM (diabetes mellitus), type 2 ([place]) 09/04/2016  . GERD (gastroesophageal reflux disease)   . HTN, goal below 140/90 11/21/2016  . Hyperlipidemia 06/20/2016  . Hypothyroidism, unspecified 11/21/2016  . Primary osteoarthritis 11/21/2016   involving multiple joints  . Stroke [facility])    Past Surgical History:  Procedure Laterality Date  . AMPUTATION Right 02/13/2018   Procedure: AMPUTATION RAY ( 2ND TOE );  Surgeon: [name], MD;  Location: [facility] ORS;  Service: Vascular;  Laterality: Right;  . CATARACT EXTRACTION Bilateral   . COLONOSCOPY    . Finger crystal removal     right 3rd  . LOWER EXTREMITY ANGIOGRAPHY Right 01/06/2018   Procedure: LOWER EXTREMITY ANGIOGRAPHY;  Surgeon: [name], MD;  Location: [facility];  Service: Cardiovascular;  Laterality: Right;  . toe crystal removal     right 1st toe    No Known Allergies  Outpatient Encounter Medications as of 10/27/2018  Medication Sig  . acetaminophen (MAPAP ARTHRITIS PAIN) 650 MG CR tablet Take 650 mg 2 (two) times daily by mouth.   Marland Kitchen acetaminophen (TYLENOL) 325 MG tablet Take 650 mg by mouth every 4 (four) hours as needed. for pain/ increased temp. May be administered orally, per G-tube if needed or rectally if unable to swallow (separate order). Maximum dose for 24 hours is 3,000 mg from all sources of Acetaminophen/ Tylenol  . alum & mag hydroxide-simeth (MAALOX PLUS) 400-400-40 MG/5ML suspension Take 30 mLs by mouth every 4 (four) hours as needed for indigestion.  . Amino Acids-Protein Hydrolys (FEEDING SUPPLEMENT, PRO-STAT  SUGAR FREE 64,) LIQD Take 30 mLs by mouth daily. To Promote Wound Healing. Wound on right lower leg  . aspirin EC 81 MG tablet Take 1 tablet (81 mg total) by mouth daily.  . Cholecalciferol (VITAMIN D3) 2000 units capsule Take 2,000 Units by mouth daily.   . clopidogrel (PLAVIX) 75 MG tablet Take 1 tablet (75 mg  total) by mouth daily.  . ferrous sulfate 325 (65 FE) MG tablet Take 325 mg by mouth daily with breakfast.  . Glucerna (GLUCERNA) LIQD Take 237 mLs by mouth 2 (two) times daily.  . [place] (DERMACLOUD) CREA Apply liberal amount topically to area of skin irritation as needed.  OK to leave at bedside  . Insulin Glargine (LANTUS SOLOSTAR) 100 UNIT/ML Solostar Pen Inject 8 Units into the skin at bedtime.   Marland Kitchen latanoprost (XALATAN) 0.005 % ophthalmic solution Place 1 drop into both eyes at bedtime.   Marland Kitchen levothyroxine (SYNTHROID) 75 MCG tablet Take 75 mcg by mouth daily before breakfast.  . lisinopril (ZESTRIL) 30 MG tablet Take 30 mg by mouth daily.  . magnesium hydroxide (MILK OF MAGNESIA) 400 MG/5ML suspension Take 30 mLs by mouth every 4 (four) hours as needed for mild constipation. Constipation/ no BM for 2 days  . Magnesium Oxide 250 MG TABS Take 250 mg by mouth daily.  . pantoprazole (PROTONIX) 40 MG tablet Take 40 mg by mouth daily.  . polyethylene glycol (MIRALAX / GLYCOLAX) packet Take 17 g by mouth daily.  . sitaGLIPtin (JANUVIA) 50 MG tablet Take 50 mg by mouth daily.  . traZODone (DESYREL) 50 MG tablet Take 25 mg by mouth at bedtime. 0.5 tablet to = 25 mg  . Trolamine Salicylate (ASPERCREME) 10 % LOTN Apply thin film topically to areas of pain 3 times a day as needed  . UNABLE TO FIND Diet: regular. Low NA, NCS  . vitamin B-12 (CYANOCOBALAMIN) 500 MCG tablet Take 500 mcg by mouth daily.  . [DISCONTINUED] levothyroxine (SYNTHROID, LEVOTHROID) 88 MCG tablet Take 88 mcg by mouth daily before breakfast.  . [DISCONTINUED] Silver (AQUACEL-AG FOAM EX) Cleanse wound with normal saline, skin prep periwound, cut Aquacel to size of wound and apply to wound base, cover with allevyn   No facility-administered encounter medications on file as of 10/27/2018.     Review of Systems  GENERAL: No change in appetite, no fatigue, no weight changes, no fever, chills or weakness MOUTH and THROAT:  Denies oral discomfort, gingival pain or bleeding, pain from teeth or hoarseness   RESPIRATORY: no cough, SOB, DOE, wheezing, hemoptysis CARDIAC: No chest pain, edema or palpitations GI: No abdominal pain, diarrhea, constipation, heart burn, nausea or vomiting GU: Denies dysuria, frequency, hematuria, incontinence, or discharge NEUROLOGICAL: Denies dizziness, syncope, numbness, or headache PSYCHIATRIC: Denies feelings of depression or anxiety. No report of hallucinations, insomnia, paranoia, or agitation    Immunization History  Administered Date(s) Administered  . Influenza,inj,Quad PF,6+ Mos 01/28/2015  . Influenza-Unspecified 01/27/2015, 02/14/2016, 03/05/2018  . PPD Test 12/28/2016  . Pneumococcal Polysaccharide-23 01/21/2018  . Td 07/08/2017   Pertinent  Health Maintenance Due  Topic Date Due  . FOOT EXAM  03/27/1929  . OPHTHALMOLOGY EXAM  03/27/1929  . INFLUENZA VACCINE  12/27/2018  . PNA vac Low Risk Adult (2 of 2 - PCV13) 01/22/2019  . HEMOGLOBIN A1C  02/04/2019  . DEXA SCAN  Discontinued   Fall Risk  09/04/2016  Falls in the past year? No     Vitals:   10/27/18 1431  BP: 129/65  Pulse: 75  Resp: 20  Temp: (!) 97.3 F (36.3 C)  TempSrc: Oral  SpO2: 100%  Weight: 141 lb (64 kg)  Height: 5\' 2"  (1.575 m)   Body mass index is 25.79 kg/m.  Physical Exam  GENERAL APPEARANCE: Well nourished. In no acute distress. Normal body habitus MOUTH and THROAT: Lips are without lesions. Oral mucosa is moist and without lesions. Tongue is normal in shape, size, and color and without lesions RESPIRATORY: Breathing is even & unlabored, BS CTAB CARDIAC: RRR, no murmur,no extra heart sounds, no edema GI: Abdomen soft, normal BS, no masses, no tenderness EXTREMITIES:  Able to move X 4 extremities NEUROLOGICAL: There is no tremor. Speech is clear. Alert to self, disoriented to time and place. PSYCHIATRIC: Affect and behavior are appropriate  Labs reviewed: Recent Labs     02/06/18 1120 07/04/18 1200 07/07/18 1256  NA 134* 133* 136  K 5.1 5.0 4.7  CL 101 102 103  CO2 24 21* 25  GLUCOSE 169* 345* 347*  BUN 33* 53* 40*  CREATININE 1.25* 1.39* 1.09*  CALCIUM 9.3 8.9 9.0   Recent Labs    07/04/18 1200 07/07/18 1256  AST 26 29  ALT 16 21  ALKPHOS 75 106  BILITOT 0.5 0.6  PROT 6.1* 6.6  ALBUMIN 3.1* 3.4*   Recent Labs    02/06/18 1120 07/04/18 1200 07/07/18 1256  WBC 6.3 8.7 5.3  NEUTROABS 3.8 6.0 3.1  HGB 10.6* 7.7* 7.8*  HCT 30.8* 26.5* 27.6*  MCV 95.1 93.3 95.8  PLT 229 207 215   Lab Results  Component Value Date   TSH 0.241 (L) 10/27/2018   Lab Results  Component Value Date   HGBA1C 8.7 (H) 08/04/2018   Lab Results  Component Value Date   CHOL 112 08/13/2017   HDL 45 08/13/2017   LDLCALC 39 08/13/2017   LDLDIRECT 58.0 09/04/2016   TRIG 142 08/13/2017   CHOLHDL 2.5 08/13/2017     Assessment/Plan  1. Hypothyroidism due to acquired atrophy of thyroid Lab Results  Component Value Date   TSH 0.241 (L) 10/27/2018  - will decrease levothyroxine from 88 mcg to 75 mcg daily, check TSH in 6 weeks   2. Other insomnia -Stable, continue trazodone 50 mg 1/2 tab = 25 mg at bedtime    Family/ staff Communication: Discussed plan of care with resident.  Labs/tests ordered: TSH in 6 weeks  Goals of care:   Long-term care   Durenda Age, DNP, FNP-BC [facility] [phone] (Monday-Friday 8:00 a.m. - 5:00 p.m.) [phone] (after hours)

## 2018-10-29 DIAGNOSIS — R279 Unspecified lack of coordination: Secondary | ICD-10-CM | POA: Diagnosis not present

## 2018-10-29 DIAGNOSIS — H409 Unspecified glaucoma: Secondary | ICD-10-CM | POA: Diagnosis not present

## 2018-10-29 DIAGNOSIS — R41841 Cognitive communication deficit: Secondary | ICD-10-CM | POA: Diagnosis not present

## 2018-10-29 DIAGNOSIS — E1151 Type 2 diabetes mellitus with diabetic peripheral angiopathy without gangrene: Secondary | ICD-10-CM | POA: Diagnosis not present

## 2018-10-29 DIAGNOSIS — Z89421 Acquired absence of other right toe(s): Secondary | ICD-10-CM | POA: Diagnosis not present

## 2018-10-29 NOTE — Progress Notes (Signed)
Ashley [name] ([id number]) Visit Report for 10/23/2018 Chief Complaint Document Details Patient Name: [name], [name] Date of Service: 10/23/2018 1:00 PM Medical Record Number: [id number] Patient Account Number: [id number] Date of Birth/Sex: 10-17-1918 (85 y.o. F) Treating RN: Cornell Barman Primary Care Provider: Frazier Richards Other Clinician: Referring Provider: Frazier Richards Treating Provider/Extender: Ashley [name]: 2 Information Obtained from: Patient Chief Complaint Left lower leg ulcer Electronic Signature(s) Signed: 10/23/2018 4:36:20 PM By: Worthy Keeler PA-C Entered By: Worthy Keeler on 10/23/2018 13:05:50 Purohit, Toree P. ([id number]) -------------------------------------------------------------------------------- HPI Details Patient Name: [name], [name] Date of Service: 10/23/2018 1:00 PM Medical Record Number: [id number] Patient Account Number: [id number] Date of Birth/Sex: [birth year] (85 y.o. F) Treating RN: Cornell Barman Primary Care Provider: Frazier Richards Other Clinician: Referring Provider: Frazier Richards Treating Provider/Extender: Ashley Hake, Ashley [name] Weeks in [name]: 2 History of Present Illness HPI Description: 01/30/18-She is seen in initial evaluation for a right plantar calf ulcer. She is being treated for both the calf ulcer and a right second toe ulcer; the right second toe is planned for amputation on 9/19. The facility has been treating both ulcers with santyl. On 8/12 she underwent angiogram of the right lower extremity with angioplasty to the posterior tibial, right mid distal SFA, popliteal artery with stent placement to the right popliteal artery; post-procedure ABI-non-compressible bilaterally, RIGHT TBI 0.25, LEFT TBI 0.55. The right posterior calf ulcer is healthy with minimal amount of non-viable tissue remaining. We will continue with santyl, with expectation to change [name] plan next week. She is a diabetic with  last A1c 7.6 (01/2018) 02/07/18 on evaluation today patient actually appears to be showing signs of good improvement in regard to the right posterior lower extremity. With that being said the right second toe ulcer is actually plan for amputation at this point secondary to infection and osteomyelitis. This is actually scheduled for next Thursday. With that being said her wound seems to be healing quite nicely in regard to the posterior ulcer at this time she does have some Slough noted. That's in regard to lower extremity. 02/20/18 on evaluation today patient presents after having had invitation of her right second toe which was performed this past Thursday, when we could go by Dr. dew. Fortunately that seems to be doing fairly well although he's managing that we're not really do anything in that regard at this time. In regard to her posterior ulcer she actually appears to be doing very well. This infection is an excellent granulation bed there's really no slough noted on the surface of the wound at this point I do believe the Santyl has done its job currently. I think were ready to switch to something different. 02/27/18 on evaluation today patient actually appears to be showing signs of great improvement in regard to her lower extremity ulcer. In fact this is almost completely healed. I'm very pleased with how things seem to be progressing. 03/13/18 on evaluation today patient actually appears to be doing very well in regard to her wound in fact this appears to be completely healed. With that being said she does have a rash around the area where the wound was I believe this is likely due to the bandaging and there is really no open wound just some irritation in this area. Overall she is doing very well. She did see Dr. dew today in regard to her toe amputation site that appears to be doing well the sutures were taken out at this point. Readmission:  10/09/18 this is the patient whom I have previously  seen here in the clinic for an issue that she had on her right lower extremity actually. With that being said today she has a wound on her left lower extremity which actually appears to be doing quite well all things considering. She actually has some new skin growing over the area and despite the fact that Silvadene has been the main [name] of choice I feel like she has made improvements which is good news. No fevers, chills, nausea, or vomiting noted at this time. 10/23/18 on evaluation today patient appears to be doing very well in regard to her leg ulcer. In fact this appears to be completely healed which is excellent news after only two weeks. Overall I feel like she is shown signs of excellent improvement at this time. She's having no pain. Electronic Signature(s) Signed: 10/23/2018 4:36:20 PM By: Worthy Keeler PA-C Entered By: Worthy Keeler on 10/23/2018 13:17:48 [name], [name] ([id number]) -------------------------------------------------------------------------------- Physical Exam Details Patient Name: [name], [name] Date of Service: 10/23/2018 1:00 PM Medical Record Number: [id number] Patient Account Number: [id number] Date of Birth/Sex: 09-30-1918 (85 y.o. F) Treating RN: Cornell Barman Primary Care Provider: Frazier Richards Other Clinician: Referring Provider: Frazier Richards Treating Provider/Extender: STONE III, Mayela Bullard Weeks in [name]: 2 Constitutional Well-nourished and well-hydrated in no acute distress. Respiratory normal breathing without difficulty. clear to auscultation bilaterally. Cardiovascular regular rate and rhythm with normal S1, S2. Psychiatric Patient is not able to cooperate in decision making regarding care. Patient is oriented to person only. pleasant and cooperative. Notes Patient's wound show signs of complete epithelialization and though there is brand-new skin here and it is definitely somewhat fragile I do believe that she is healed quite  nicely and that this is going to do very well. We likely do want to protect it for the next two weeks just to ensure nothing gets worse. Electronic Signature(s) Signed: 10/23/2018 4:36:20 PM By: Worthy Keeler PA-C Entered By: Worthy Keeler on 10/23/2018 13:18:21 [name], [name] ([id number]) -------------------------------------------------------------------------------- Physician Orders Details Patient Name: Gust, Abisai P. Date of Service: 10/23/2018 1:00 PM Medical Record Number: [id number] Patient Account Number: [id number] Date of Birth/Sex: 03-21-1919 (85 y.o. F) Treating RN: Cornell Barman Primary Care Provider: Frazier Richards Other Clinician: Referring Provider: Frazier Richards Treating Provider/Extender: Ashley Hake, Estefania Kamiya Weeks in [name]: 2 Verbal / Phone Orders: No Diagnosis Coding ICD-10 Coding Code Description E11.622 Type 2 diabetes mellitus with other skin ulcer I73.89 Other specified peripheral vascular diseases L97.822 Non-pressure chronic ulcer of other part of left lower leg with fat layer exposed I10 Essential (primary) hypertension H90.0 Conductive hearing loss, bilateral Discharge From [facility] o Discharge from [place] complete Electronic Signature(s) Signed: 10/23/2018 4:36:20 PM By: Worthy Keeler PA-C Signed: 10/28/2018 5:54:26 PM By: Gretta Cool, BSN, RN, CWS, Kim RN, BSN Entered By: Gretta Cool, BSN, RN, CWS, Kim on 10/23/2018 13:13:05 [place], Britanny P. ([id number]) -------------------------------------------------------------------------------- Problem List Details Patient Name: Juste, Keali P. Date of Service: 10/23/2018 1:00 PM Medical Record Number: [id number] Patient Account Number: [id number] Date of Birth/Sex: [birth year] (85 y.o. F) Treating RN: Cornell Barman Primary Care Provider: Frazier Richards Other Clinician: Referring Provider: Frazier Richards Treating Provider/Extender: Ashley Hake, Samary Shatz Weeks in [name]: 2 Active  Problems ICD-10 Evaluated Encounter Code Description Active Date Today Diagnosis E11.622 Type 2 diabetes mellitus with other skin ulcer 10/09/2018 No Yes I73.89 Other specified peripheral vascular diseases 10/09/2018 No Yes L97.822 Non-pressure chronic ulcer of other part  of left lower leg with 10/09/2018 No Yes fat layer exposed I10 Essential (primary) hypertension 10/09/2018 No Yes H90.0 Conductive hearing loss, bilateral 10/09/2018 No Yes Inactive Problems Resolved Problems Electronic Signature(s) Signed: 10/23/2018 4:36:20 PM By: Worthy Keeler PA-C Entered By: Worthy Keeler on 10/23/2018 13:05:40 Norcia, Alette P. ([id number]) -------------------------------------------------------------------------------- Progress Note Details Patient Name: [name], [name] Date of Service: 10/23/2018 1:00 PM Medical Record Number: [id number] Patient Account Number: [id number] Date of Birth/Sex: [birth year] (85 y.o. F) Treating RN: Cornell Barman Primary Care Provider: Frazier Richards Other Clinician: Referring Provider: Frazier Richards Treating Provider/Extender: Ashley Hake, Richrd Kuzniar Weeks in [name]: 2 Subjective Chief Complaint Information obtained from Patient Left lower leg ulcer History of Present Illness (HPI) 01/30/18-She is seen in initial evaluation for a right plantar calf ulcer. She is being treated for both the calf ulcer and a right second toe ulcer; the right second toe is planned for amputation on 9/19. The facility has been treating both ulcers with santyl. On 8/12 she underwent angiogram of the right lower extremity with angioplasty to the posterior tibial, right mid distal SFA, popliteal artery with stent placement to the right popliteal artery; post-procedure ABI-non-compressible bilaterally, RIGHT TBI 0.25, LEFT TBI 0.55. The right posterior calf ulcer is healthy with minimal amount of non-viable tissue remaining. We will continue with santyl, with expectation to change [name]  plan next week. She is a diabetic with last A1c 7.6 (01/2018) 02/07/18 on evaluation today patient actually appears to be showing signs of good improvement in regard to the right posterior lower extremity. With that being said the right second toe ulcer is actually plan for amputation at this point secondary to infection and osteomyelitis. This is actually scheduled for next Thursday. With that being said her wound seems to be healing quite nicely in regard to the posterior ulcer at this time she does have some Slough noted. That's in regard to lower extremity. 02/20/18 on evaluation today patient presents after having had invitation of her right second toe which was performed this past Thursday, when we could go by Dr. dew. Fortunately that seems to be doing fairly well although he's managing that we're not really do anything in that regard at this time. In regard to her posterior ulcer she actually appears to be doing very well. This infection is an excellent granulation bed there's really no slough noted on the surface of the wound at this point I do believe the Santyl has done its job currently. I think were ready to switch to something different. 02/27/18 on evaluation today patient actually appears to be showing signs of great improvement in regard to her lower extremity ulcer. In fact this is almost completely healed. I'm very pleased with how things seem to be progressing. 03/13/18 on evaluation today patient actually appears to be doing very well in regard to her wound in fact this appears to be completely healed. With that being said she does have a rash around the area where the wound was I believe this is likely due to the bandaging and there is really no open wound just some irritation in this area. Overall she is doing very well. She did see Dr. dew today in regard to her toe amputation site that appears to be doing well the sutures were taken out at this point. Readmission: 10/09/18 this  is the patient whom I have previously seen here in the clinic for an issue that she had on her right lower extremity actually. With that being said  today she has a wound on her left lower extremity which actually appears to be doing quite well all things considering. She actually has some new skin growing over the area and despite the fact that Silvadene has been the main [name] of choice I feel like she has made improvements which is good news. No fevers, chills, nausea, or vomiting noted at this time. 10/23/18 on evaluation today patient appears to be doing very well in regard to her leg ulcer. In fact this appears to be completely healed which is excellent news after only two weeks. Overall I feel like she is shown signs of excellent improvement at this time. She's having no pain. [name], [name] ([id number]) Patient History Information obtained from Patient. Social History Never smoker, Marital Status - Widowed, Alcohol Use - Never, Drug Use - No History, Caffeine Use - Moderate. Medical History Eyes Patient has history of Glaucoma Denies history of Cataracts, Optic Neuritis Ear/Nose/Mouth/Throat Denies history of Chronic sinus problems/congestion, Middle ear problems Hematologic/Lymphatic Patient has history of Anemia Denies history of Hemophilia, Human Immunodeficiency Virus, Lymphedema, Sickle Cell Disease Respiratory Denies history of Aspiration, Asthma, Chronic Obstructive Pulmonary Disease (COPD), Pneumothorax, Sleep Apnea, Tuberculosis Cardiovascular Patient has history of Hypertension, Peripheral Arterial Disease Denies history of Angina, Arrhythmia, Congestive Heart Failure, Coronary Artery Disease, Deep Vein Thrombosis, Hypotension, Myocardial Infarction, Peripheral Venous Disease, Phlebitis, Vasculitis Gastrointestinal Denies history of Cirrhosis , Colitis, Crohn s, Hepatitis A, Hepatitis B, Hepatitis C Endocrine Patient has history of Type II Diabetes Denies history  of Type I Diabetes Genitourinary Patient has history of End Stage Renal Disease - CKD stage 3 Immunological Denies history of Lupus Erythematosus, Raynaud s, Scleroderma Integumentary (Skin) Denies history of History of Burn, History of pressure wounds Musculoskeletal Patient has history of Gout Denies history of Rheumatoid Arthritis, Osteoarthritis, Osteomyelitis Neurologic Denies history of Dementia, Neuropathy, Quadriplegia, Paraplegia, Seizure Disorder Oncologic Denies history of Received Chemotherapy, Received Radiation Psychiatric Denies history of Anorexia/bulimia, Confinement Anxiety Medical And Surgical History Notes Ear/Nose/Mouth/Throat HOH Cardiovascular stent placed in right leg 3 weeks ago Review of Systems (ROS) Constitutional Symptoms (General Health) Denies complaints or symptoms of Fatigue, Fever, Chills, Marked Weight Change. Respiratory Denies complaints or symptoms of Chronic or frequent coughs, Shortness of Breath. Cardiovascular Denies complaints or symptoms of Chest pain, LE edema. Psychiatric Denies complaints or symptoms of Anxiety, Claustrophobia. [name], [name] ([id number]) Objective Constitutional Well-nourished and well-hydrated in no acute distress. Vitals Time Taken: 1:05 PM, Height: 64 in, Weight: 130 lbs, BMI: 22.3, Temperature: 97.7 F, Pulse: 83 bpm, Respiratory Rate: 16 breaths/min, Blood Pressure: 140/56 mmHg. Respiratory normal breathing without difficulty. clear to auscultation bilaterally. Cardiovascular regular rate and rhythm with normal S1, S2. Psychiatric Patient is not able to cooperate in decision making regarding care. Patient is oriented to person only. pleasant and cooperative. General Notes: Patient's wound show signs of complete epithelialization and though there is brand-new skin here and it is definitely somewhat fragile I do believe that she is healed quite nicely and that this is going to do very well. We likely do  want to protect it for the next two weeks just to ensure nothing gets worse. Integumentary (Hair, Skin) Wound #3 status is Healed - Epithelialized. Original cause of wound was Gradually Appeared. The wound is located on the Left,Proximal,Anterior Lower Leg. The wound measures 0cm length x 0cm width x 0cm depth; 0cm^2 area and 0cm^3 volume. The wound is limited to skin breakdown. There is no tunneling or undermining noted. There is a none  present amount of drainage noted. The wound margin is indistinct and nonvisible. There is no granulation within the wound bed. There is no necrotic tissue within the wound bed. Assessment Active Problems ICD-10 Type 2 diabetes mellitus with other skin ulcer Other specified peripheral vascular diseases Non-pressure chronic ulcer of other part of left lower leg with fat layer exposed Essential (primary) hypertension Conductive hearing loss, bilateral Plan Rex, Kooper P. ([id number]) Discharge From [facility] Services: Discharge from [place] complete Patient's wound bed again is shown signs of being completely healed I'm gonna discontinue wound care services at this point but I did recommend a protective dressing for the patient to be used for the next two weeks as we place today this will be just for the ABD pad and some stretch netting. If anything changes worsens in the meantime she will contact the office and let me know. Or rather her family members will. Otherwise I'm pleased that she has healed so quickly. Electronic Signature(s) Signed: 10/23/2018 4:36:20 PM By: Worthy Keeler PA-C Entered By: Worthy Keeler on 10/23/2018 13:19:08 [name], [name] ([id number]) -------------------------------------------------------------------------------- ROS/PFSH Details Patient Name: [name], [name] Date of Service: 10/23/2018 1:00 PM Medical Record Number: [id number] Patient Account Number: [id number] Date of Birth/Sex: 09-01-1918 (85 y.o.  F) Treating RN: Cornell Barman Primary Care Provider: Frazier Richards Other Clinician: Referring Provider: Frazier Richards Treating Provider/Extender: Ashley Hake, Jakiya Bookbinder Weeks in [name]: 2 Information Obtained From Patient Constitutional Symptoms (General Health) Complaints and Symptoms: Negative for: Fatigue; Fever; Chills; Marked Weight Change Respiratory Complaints and Symptoms: Negative for: Chronic or frequent coughs; Shortness of Breath Medical History: Negative for: Aspiration; Asthma; Chronic Obstructive Pulmonary Disease (COPD); Pneumothorax; Sleep Apnea; Tuberculosis Cardiovascular Complaints and Symptoms: Negative for: Chest pain; LE edema Medical History: Positive for: Hypertension; Peripheral Arterial Disease Negative for: Angina; Arrhythmia; Congestive Heart Failure; Coronary Artery Disease; Deep Vein Thrombosis; Hypotension; Myocardial Infarction; Peripheral Venous Disease; Phlebitis; Vasculitis Past Medical History Notes: stent placed in right leg 3 weeks ago Psychiatric Complaints and Symptoms: Negative for: Anxiety; Claustrophobia Medical History: Negative for: Anorexia/bulimia; Confinement Anxiety Eyes Medical History: Positive for: Glaucoma Negative for: Cataracts; Optic Neuritis Ear/Nose/Mouth/Throat Medical History: Negative for: Chronic sinus problems/congestion; Middle ear problems Past Medical History Notes: HOH [name], [name] ([id number]) Hematologic/Lymphatic Medical History: Positive for: Anemia Negative for: Hemophilia; Human Immunodeficiency Virus; Lymphedema; Sickle Cell Disease Gastrointestinal Medical History: Negative for: Cirrhosis ; Colitis; Crohnos; Hepatitis A; Hepatitis B; Hepatitis C Endocrine Medical History: Positive for: Type II Diabetes Negative for: Type I Diabetes Time with diabetes: 10 weeks Treated with: Insulin, Oral agents Genitourinary Medical History: Positive for: End Stage Renal Disease - CKD stage  3 Immunological Medical History: Negative for: Lupus Erythematosus; Raynaudos; Scleroderma Integumentary (Skin) Medical History: Negative for: History of Burn; History of pressure wounds Musculoskeletal Medical History: Positive for: Gout Negative for: Rheumatoid Arthritis; Osteoarthritis; Osteomyelitis Neurologic Medical History: Negative for: Dementia; Neuropathy; Quadriplegia; Paraplegia; Seizure Disorder Oncologic Medical History: Negative for: Received Chemotherapy; Received Radiation HBO Extended History Items Eyes: Glaucoma Immunizations Pneumococcal Vaccine: Received Pneumococcal Vaccination: Yes [name], [name] ([id number]) Implantable Devices None Family and Social History Never smoker; Marital Status - Widowed; Alcohol Use: Never; Drug Use: No History; Caffeine Use: Moderate; Financial Concerns: No; Food, Clothing or Shelter Needs: No; Support System Lacking: No; Transportation Concerns: No Physician Affirmation I have reviewed and agree with the above information. Electronic Signature(s) Signed: 10/23/2018 4:36:20 PM By: Worthy Keeler PA-C Signed: 10/28/2018 5:54:26 PM By: Gretta Cool, BSN, RN, CWS, Kim  RN, BSN Entered By: Worthy Keeler on 10/23/2018 13:18:02 [name], [name] ([id number]) -------------------------------------------------------------------------------- SuperBill Details Patient Name: Lucier, Kileigh P. Date of Service: 10/23/2018 Medical Record Number: [id number] Patient Account Number: [id number] Date of Birth/Sex: 08/31/1918 (85 y.o. F) Treating RN: Cornell Barman Primary Care Provider: Frazier Richards Other Clinician: Referring Provider: Frazier Richards Treating Provider/Extender: Ashley Hake, Geran Haithcock Weeks in [name]: 2 Diagnosis Coding ICD-10 Codes Code Description E11.622 Type 2 diabetes mellitus with other skin ulcer I73.89 Other specified peripheral vascular diseases L97.822 Non-pressure chronic ulcer of other part of left lower leg with fat  layer exposed I10 Essential (primary) hypertension H90.0 Conductive hearing loss, bilateral Facility Procedures CPT4 Code: [id number] Description: [phone] - WOUND CARE VISIT-LEV 2 EST PT Modifier: Quantity: 1 Physician Procedures CPT4 Code Description: [id number] - WC PHYS LEVEL 3 - EST PT ICD-10 Diagnosis Description E11.622 Type 2 diabetes mellitus with other skin ulcer I73.89 Other specified peripheral vascular diseases L97.822 Non-pressure chronic ulcer of other part of  left lower leg wit I10 Essential (primary) hypertension Modifier: h fat layer expos Quantity: 1 ed Electronic Signature(s) Signed: 10/23/2018 4:36:20 PM By: Worthy Keeler PA-C Entered By: Worthy Keeler on 10/23/2018 13:19:19

## 2018-10-29 NOTE — Progress Notes (Signed)
REVONDA, MENTER ([id number]) Visit Report for 10/23/2018 Arrival Information Details Patient Name: Ashley [name], [name] Date of Service: 10/23/2018 1:00 PM Medical Record Number: [id number] Patient Account Number: [id number] Date of Birth/Sex: 1918-12-15 (85 y.o. F) Treating RN: Montey Hora Primary Care Latreshia Beauchaine: Frazier Richards Other Clinician: Referring Jahbari Repinski: Frazier Richards Treating Urian Martenson/Extender: Melburn Hake, HOYT Weeks in Treatment: 2 Visit Information History Since Last Visit Added or deleted any medications: No Patient Arrived: Wheel Chair Any new allergies or adverse reactions: No Arrival Time: 12:58 Had a fall or experienced change in No Accompanied By: neice activities of daily living that may affect Transfer Assistance: Manual risk of falls: Patient Identification Verified: Yes Signs or symptoms of abuse/neglect since last visito No Secondary Verification Process Yes Hospitalized since last visit: No Completed: Implantable device outside of the clinic excluding No Patient Has Alerts: Yes cellular tissue based products placed in the center Patient Alerts: aspirin 81 since last visit: ABI 04/29/18 L [place] R Has Dressing in Place as Prescribed: Yes 1.24 Pain Present Now: No TBI L .53 R .34 Electronic Signature(s) Signed: 10/23/2018 3:16:01 PM By: Montey Hora Entered By: Montey Hora on 10/23/2018 13:00:50 Shrewsberry, Nakeshia P. ([id number]) -------------------------------------------------------------------------------- Clinic Level of Care Assessment Details Patient Name: Keyworth, Cherith P. Date of Service: 10/23/2018 1:00 PM Medical Record Number: [id number] Patient Account Number: [id number] Date of Birth/Sex: [birth year] (85 y.o. F) Treating RN: Cornell Barman Primary Care Kathaleya Mcduffee: Frazier Richards Other Clinician: Referring Thurley Francesconi: Frazier Richards Treating Adelle Zachar/Extender: Melburn Hake, HOYT Weeks in Treatment: 2 Clinic Level of Care Assessment Items TOOL 4  Quantity Score []  - Use when only an EandM is performed on FOLLOW-UP visit 0 ASSESSMENTS - Nursing Assessment / Reassessment []  - Reassessment of Co-morbidities (includes updates in patient status) 0 X- 1 5 Reassessment of Adherence to Treatment Plan ASSESSMENTS - Wound and Skin Assessment / Reassessment X - Simple Wound Assessment / Reassessment - one wound 1 5 []  - 0 Complex Wound Assessment / Reassessment - multiple wounds []  - 0 Dermatologic / Skin Assessment (not related to wound area) ASSESSMENTS - Focused Assessment []  - Circumferential Edema Measurements - multi extremities 0 []  - 0 Nutritional Assessment / Counseling / Intervention []  - 0 Lower Extremity Assessment (monofilament, tuning fork, pulses) []  - 0 Peripheral Arterial Disease Assessment (using hand held doppler) ASSESSMENTS - Ostomy and/or Continence Assessment and Care []  - Incontinence Assessment and Management 0 []  - 0 Ostomy Care Assessment and Management (repouching, etc.) PROCESS - Coordination of Care X - Simple Patient / Family Education for ongoing care 1 15 []  - 0 Complex (extensive) Patient / Family Education for ongoing care []  - 0 Staff obtains Programmer, systems, Records, Test Results / Process Orders []  - 0 Staff telephones HHA, Nursing Homes / Clarify orders / etc []  - 0 Routine Transfer to another Facility (non-emergent condition) []  - 0 Routine Hospital Admission (non-emergent condition) []  - 0 New Admissions / Biomedical engineer / Ordering NPWT, Apligraf, etc. []  - 0 Emergency Hospital Admission (emergent condition) X- 1 10 Simple Discharge Coordination Jurado, Shequita P. ([id number]) []  - 0 Complex (extensive) Discharge Coordination PROCESS - Special Needs []  - Pediatric / Minor Patient Management 0 []  - 0 Isolation Patient Management []  - 0 Hearing / Language / Visual special needs []  - 0 Assessment of Community assistance (transportation, D/C planning, etc.) []  - 0 Additional  assistance / Altered mentation []  - 0 Support Surface(s) Assessment (bed, cushion, seat, etc.) INTERVENTIONS - Wound Cleansing / Measurement X - Simple Wound Cleansing -  one wound 1 5 []  - 0 Complex Wound Cleansing - multiple wounds X- 1 5 Wound Imaging (photographs - any number of wounds) []  - 0 Wound Tracing (instead of photographs) X- 1 5 Simple Wound Measurement - one wound []  - 0 Complex Wound Measurement - multiple wounds INTERVENTIONS - Wound Dressings []  - Small Wound Dressing one or multiple wounds 0 []  - 0 Medium Wound Dressing one or multiple wounds []  - 0 Large Wound Dressing one or multiple wounds []  - 0 Application of Medications - topical []  - 0 Application of Medications - injection INTERVENTIONS - Miscellaneous []  - External ear exam 0 []  - 0 Specimen Collection (cultures, biopsies, blood, body fluids, etc.) []  - 0 Specimen(s) / Culture(s) sent or taken to Lab for analysis []  - 0 Patient Transfer (multiple staff / Civil Service fast streamer / Similar devices) []  - 0 Simple Staple / Suture removal (25 or less) []  - 0 Complex Staple / Suture removal (26 or more) []  - 0 Hypo / Hyperglycemic Management (close monitor of Blood Glucose) []  - 0 Ankle / Brachial Index (ABI) - do not check if billed separately X- 1 5 Vital Signs Belfiore, Enolia P. ([id number]) Has the patient been seen at the hospital within the last three years: Yes Total Score: 55 Level Of Care: New/Established - Level 2 Electronic Signature(s) Signed: 10/28/2018 5:54:26 PM By: Gretta Cool, BSN, RN, CWS, Kim RN, BSN Entered By: Gretta Cool, BSN, RN, CWS, Kim on 10/23/2018 13:15:45 Simpson, Alanis P. ([id number]) -------------------------------------------------------------------------------- Lower Extremity Assessment Details Patient Name: Vowels, Dreya P. Date of Service: 10/23/2018 1:00 PM Medical Record Number: [id number] Patient Account Number: [id number] Date of Birth/Sex: [birth year] (85 y.o. F) Treating RN: Montey Hora Primary Care Sherle Mello: Frazier Richards Other Clinician: Referring Anzleigh Slaven: Frazier Richards Treating Timmey Lamba/Extender: STONE III, HOYT Weeks in Treatment: 2 Edema Assessment Assessed: [Left: No] [Right: No] Edema: [Left: N] [Right: o] Vascular Assessment Pulses: Dorsalis Pedis Palpable: [Left:Yes] Electronic Signature(s) Signed: 10/23/2018 3:16:01 PM By: Montey Hora Entered By: Montey Hora on 10/23/2018 13:05:22 Granquist, Krystol P. ([id number]) -------------------------------------------------------------------------------- Multi Wound Chart Details Patient Name: Petrosian, Stormie P. Date of Service: 10/23/2018 1:00 PM Medical Record Number: [id number] Patient Account Number: [id number] Date of Birth/Sex: Jan 29, 1919 (85 y.o. F) Treating RN: Cornell Barman Primary Care Kataleyah Carducci: Frazier Richards Other Clinician: Referring Karelly Dewalt: Frazier Richards Treating Jamien Casanova/Extender: Melburn Hake, HOYT Weeks in Treatment: 2 Vital Signs Height(in): 64 Pulse(bpm): 2 Weight(lbs): 130 Blood Pressure(mmHg): 140/56 Body Mass Index(BMI): 22 Temperature(F): 97.7 Respiratory Rate 16 (breaths/min): Photos: [N/A:N/A] Wound Location: Left, Proximal, Anterior Lower N/A N/A Leg Wounding Event: Gradually Appeared N/A N/A Primary Etiology: Venous Leg Ulcer N/A N/A Comorbid History: Glaucoma, Anemia, N/A N/A Hypertension, Peripheral Arterial Disease, Type II Diabetes, End Stage Renal Disease, Gout Date Acquired: 08/11/2018 N/A N/A Weeks of Treatment: 2 N/A N/A Wound Status: Healed - Epithelialized N/A N/A Measurements L x W x D 0x0x0 N/A N/A (cm) Area (cm) : 0 N/A N/A Volume (cm) : 0 N/A N/A % Reduction in Area: 100.00% N/A N/A % Reduction in Volume: 100.00% N/A N/A Classification: Full Thickness Without N/A N/A Exposed Support Structures Exudate Amount: None Present N/A N/A Wound Margin: Indistinct, nonvisible N/A N/A Granulation Amount: None Present (0%) N/A N/A Necrotic  Amount: None Present (0%) N/A N/A Exposed Structures: Fascia: No N/A N/A Fat Layer (Subcutaneous Tissue) Exposed: No Tendon: No Muscle: No Burrill, Barbee P. ([id number]) Joint: No Bone: No Limited to Skin Breakdown Epithelialization: Large (67-100%) N/A N/A Treatment Notes Electronic Signature(s) Signed: 10/28/2018 5:54:26 PM  By: Gretta Cool, BSN, RN, CWS, Kim RN, BSN Entered By: Gretta Cool, BSN, RN, CWS, Kim on 10/23/2018 13:12:38 Somma, Loney Hering ([id number]) -------------------------------------------------------------------------------- [place] Details Patient Name: Washam, Lupie P. Date of Service: 10/23/2018 1:00 PM Medical Record Number: [id number] Patient Account Number: [id number] Date of Birth/Sex: 1918/06/15 (85 y.o. F) Treating RN: Cornell Barman Primary Care Sakiyah Shur: Frazier Richards Other Clinician: Referring Angely Dietz: Frazier Richards Treating Tiasia Weberg/Extender: Melburn Hake, HOYT Weeks in Treatment: 2 Active Inactive Electronic Signature(s) Signed: 10/28/2018 5:54:26 PM By: Gretta Cool, BSN, RN, CWS, Kim RN, BSN Entered By: Gretta Cool, BSN, RN, CWS, Kim on 10/23/2018 13:12:24 Vossler, Ruhani P. ([id number]) -------------------------------------------------------------------------------- Pain Assessment Details Patient Name: Sutliff, Shinika P. Date of Service: 10/23/2018 1:00 PM Medical Record Number: [id number] Patient Account Number: [id number] Date of Birth/Sex: Apr 07, 1919 (85 y.o. F) Treating RN: Montey Hora Primary Care Cayci Mcnabb: Frazier Richards Other Clinician: Referring Trenell Moxey: Frazier Richards Treating Nickolas Chalfin/Extender: Melburn Hake, HOYT Weeks in Treatment: 2 Active Problems Location of Pain Severity and Description of Pain Patient Has Paino No Site Locations Pain Management and Medication Current Pain Management: Electronic Signature(s) Signed: 10/23/2018 3:16:01 PM By: Montey Hora Entered By: Montey Hora on 10/23/2018 13:00:56 Sollenberger, Ronnetta P.  ([id number]) -------------------------------------------------------------------------------- Patient/Caregiver Education Details Patient Name: Belton, Malayiah P. Date of Service: 10/23/2018 1:00 PM Medical Record Number: [id number] Patient Account Number: [id number] Date of Birth/Gender: [birth year] (85 y.o. F) Treating RN: Cornell Barman Primary Care Physician: Frazier Richards Other Clinician: Referring Physician: Frazier Richards Treating Physician/Extender: Melburn Hake, HOYT Weeks in Treatment: 2 Education Assessment Education Provided To: Patient Education Topics Provided Wound/Skin Impairment: Handouts: Caring for Your Ulcer, Other: protect skin for a couple of weeks Methods: Demonstration, Explain/Verbal Responses: State content correctly Electronic Signature(s) Signed: 10/28/2018 5:54:26 PM By: Gretta Cool, BSN, RN, CWS, Kim RN, BSN Entered By: Gretta Cool, BSN, RN, CWS, Kim on 10/23/2018 13:16:29 Beichner, Tien P. ([id number]) -------------------------------------------------------------------------------- Wound Assessment Details Patient Name: Cheese, Zemirah P. Date of Service: 10/23/2018 1:00 PM Medical Record Number: [id number] Patient Account Number: [id number] Date of Birth/Sex: 29-Dec-1918 (85 y.o. F) Treating RN: Cornell Barman Primary Care Kenedie Dirocco: Frazier Richards Other Clinician: Referring Oanh Devivo: Frazier Richards Treating Kim Oki/Extender: Melburn Hake, HOYT Weeks in Treatment: 2 Wound Status Wound Number: 3 Primary Venous Leg Ulcer Etiology: Wound Location: Left, Proximal, Anterior Lower Leg Wound Healed - Epithelialized Wounding Event: Gradually Appeared Status: Date Acquired: 08/11/2018 Comorbid Glaucoma, Anemia, Hypertension, Peripheral Weeks Of Treatment: 2 History: Arterial Disease, Type II Diabetes, End Stage Clustered Wound: No Renal Disease, Gout Photos Wound Measurements Length: (cm) 0 % R Width: (cm) 0 % R Depth: (cm) 0 Epi Area: (cm) 0 Tu Volume: (cm) 0  Un eduction in Area: 100% eduction in Volume: 100% thelialization: Large (67-100%) nneling: No dermining: No Wound Description Full Thickness Without Exposed Support Classification: Structures Wound Margin: Indistinct, nonvisible Exudate None Present Amount: Foul Odor After Cleansing: No Slough/Fibrino No Wound Bed Granulation Amount: None Present (0%) Exposed Structure Necrotic Amount: None Present (0%) Fascia Exposed: No Fat Layer (Subcutaneous Tissue) Exposed: No Tendon Exposed: No Muscle Exposed: No Joint Exposed: No Bone Exposed: No Limited to Skin Breakdown Adger, Mahrosh P. ([id number]) Electronic Signature(s) Signed: 10/28/2018 5:54:26 PM By: Gretta Cool, BSN, RN, CWS, Kim RN, BSN Entered By: Gretta Cool, BSN, RN, CWS, Kim on 10/23/2018 13:12:07 Barrero, Chandani P. ([id number]) -------------------------------------------------------------------------------- Vitals Details Patient Name: Mullenbach, Rachell P. Date of Service: 10/23/2018 1:00 PM Medical Record Number: [id number] Patient Account Number: [id number] Date of Birth/Sex: Aug 09, 1918 (85 y.o. F) Treating RN: Montey Hora Primary Care Letecia Arps: Frazier Richards Other Clinician:  Referring Mattalyn Anderegg: Frazier Richards Treating Malaney Mcbean/Extender: Melburn Hake, HOYT Weeks in Treatment: 2 Vital Signs Time Taken: 13:05 Temperature (F): 97.7 Height (in): 64 Pulse (bpm): 83 Weight (lbs): 130 Respiratory Rate (breaths/min): 16 Body Mass Index (BMI): 22.3 Blood Pressure (mmHg): 140/56 Reference Range: 80 - 120 mg / dl Electronic Signature(s) Signed: 10/23/2018 3:16:01 PM By: Montey Hora Entered By: Montey Hora on 10/23/2018 13:05:48

## 2018-10-30 ENCOUNTER — Ambulatory Visit (INDEPENDENT_AMBULATORY_CARE_PROVIDER_SITE_OTHER): Payer: Medicare Other | Admitting: Nurse Practitioner

## 2018-10-30 ENCOUNTER — Encounter (INDEPENDENT_AMBULATORY_CARE_PROVIDER_SITE_OTHER): Payer: Medicare Other

## 2018-10-31 DIAGNOSIS — H409 Unspecified glaucoma: Secondary | ICD-10-CM | POA: Diagnosis not present

## 2018-10-31 DIAGNOSIS — R279 Unspecified lack of coordination: Secondary | ICD-10-CM | POA: Diagnosis not present

## 2018-10-31 DIAGNOSIS — Z89421 Acquired absence of other right toe(s): Secondary | ICD-10-CM | POA: Diagnosis not present

## 2018-10-31 DIAGNOSIS — R41841 Cognitive communication deficit: Secondary | ICD-10-CM | POA: Diagnosis not present

## 2018-10-31 DIAGNOSIS — E1151 Type 2 diabetes mellitus with diabetic peripheral angiopathy without gangrene: Secondary | ICD-10-CM | POA: Diagnosis not present

## 2018-11-03 ENCOUNTER — Non-Acute Institutional Stay (SKILLED_NURSING_FACILITY): Payer: Medicare Other | Admitting: Adult Health

## 2018-11-03 ENCOUNTER — Encounter: Payer: Self-pay | Admitting: Adult Health

## 2018-11-03 DIAGNOSIS — K219 Gastro-esophageal reflux disease without esophagitis: Secondary | ICD-10-CM | POA: Diagnosis not present

## 2018-11-03 DIAGNOSIS — E1122 Type 2 diabetes mellitus with diabetic chronic kidney disease: Secondary | ICD-10-CM

## 2018-11-03 DIAGNOSIS — G4700 Insomnia, unspecified: Secondary | ICD-10-CM

## 2018-11-03 DIAGNOSIS — I10 Essential (primary) hypertension: Secondary | ICD-10-CM

## 2018-11-03 DIAGNOSIS — H409 Unspecified glaucoma: Secondary | ICD-10-CM | POA: Diagnosis not present

## 2018-11-03 DIAGNOSIS — I2510 Atherosclerotic heart disease of native coronary artery without angina pectoris: Secondary | ICD-10-CM

## 2018-11-03 DIAGNOSIS — E034 Atrophy of thyroid (acquired): Secondary | ICD-10-CM

## 2018-11-03 DIAGNOSIS — E1151 Type 2 diabetes mellitus with diabetic peripheral angiopathy without gangrene: Secondary | ICD-10-CM | POA: Diagnosis not present

## 2018-11-03 DIAGNOSIS — Z89421 Acquired absence of other right toe(s): Secondary | ICD-10-CM | POA: Diagnosis not present

## 2018-11-03 DIAGNOSIS — R279 Unspecified lack of coordination: Secondary | ICD-10-CM | POA: Diagnosis not present

## 2018-11-03 DIAGNOSIS — N183 Chronic kidney disease, stage 3 (moderate): Secondary | ICD-10-CM

## 2018-11-03 DIAGNOSIS — R41841 Cognitive communication deficit: Secondary | ICD-10-CM | POA: Diagnosis not present

## 2018-11-03 NOTE — Progress Notes (Signed)
Location:  [facility] Room Number: 345-P Place of Service:  SNF (31) Provider:  Durenda Age, DNP, FNP-BC  Patient Care Team: Kirk Ruths, MD as PCP - General (Internal Medicine) Gerlene Fee, NP as Nurse Practitioner (Geriatric Medicine)  Extended Emergency Contact Information Primary Emergency Contact: [address] [place] Phone: [phone] Relation: Niece Secondary Emergency Contact: Charolotte [address] [place] Phone: [phone] Relation: Nephew  Code Status:  DNR  Goals of care: Advanced Directive information Advanced Directives 11/03/2018  Does Patient Have a Medical Advance Directive? Yes  Type of Advance Directive Living will;Healthcare Power of Attorney  Does patient want to make changes to medical advance directive? No - Patient declined  Copy of [place] in Chart? Yes - validated most recent copy scanned in chart (See row information)  Pre-existing out of facility DNR order (yellow form or pink MOST form) -     Chief Complaint  Patient presents with   Medical Management of Chronic Issues    Routine TVAB visit    HPI:  Pt is a 85 y.o. female seen today for medical management of chronic diseases.  She is a long-term care resident of [place]. She has a PMH of CAD, CKD stage 3, gout, DM, and hypertension. She was seen in her room today. BPs 102/64, 135/78, 165/81, 159/73.  She currently takes lisinopril for her hypertension.  CBGs -132, 145, 223, 153, 179, 154. She denies pain.  She currently takes Januvia and Lantus for her diabetes mellitus.   Past Medical History:  Diagnosis Date   Acute gouty arthritis 11/21/2016   Aortic atherosclerosis ([place]) 09/04/2016   Carotid artery disease ([place]) 11/21/2016   Carotid stenosis 12/03/2015   Advanced heavily calcified atherosclerotic disease at both carotid bifurcations. Stenoses measured at 70% affecting the  right proximal ICA and 80% affecting the left proximal ICA.   Chronic kidney disease    Coronary atherosclerosis 09/04/2016   Diverticulitis    DM (diabetes mellitus), type 2 ([place]) 09/04/2016   GERD (gastroesophageal reflux disease)    HTN, goal below 140/90 11/21/2016   Hyperlipidemia 06/20/2016   Hypothyroidism, unspecified 11/21/2016   Primary osteoarthritis 11/21/2016   involving multiple joints   Stroke [facility])    Past Surgical History:  Procedure Laterality Date   AMPUTATION Right 02/13/2018   Procedure: AMPUTATION RAY ( 2ND TOE );  Surgeon: [name], MD;  Location: [facility] ORS;  Service: Vascular;  Laterality: Right;   CATARACT EXTRACTION Bilateral    COLONOSCOPY     Finger crystal removal     right 3rd   LOWER EXTREMITY ANGIOGRAPHY Right 01/06/2018   Procedure: LOWER EXTREMITY ANGIOGRAPHY;  Surgeon: [name], MD;  Location: [place] CV LAB;  Service: Cardiovascular;  Laterality: Right;   toe crystal removal     right 1st toe    No Known Allergies  Outpatient Encounter Medications as of 11/03/2018  Medication Sig   acetaminophen (MAPAP ARTHRITIS PAIN) 650 MG CR tablet Take 650 mg 2 (two) times daily by mouth.    acetaminophen (TYLENOL) 325 MG tablet Take 650 mg by mouth every 4 (four) hours as needed. for pain/ increased temp. May be administered orally, per G-tube if needed or rectally if unable to swallow (separate order). Maximum dose for 24 hours is 3,000 mg from all sources of Acetaminophen/ Tylenol   alum & mag hydroxide-simeth (MAALOX PLUS) 400-400-40 MG/5ML suspension Take 30 mLs by mouth  every 4 (four) hours as needed for indigestion.   Amino Acids-Protein Hydrolys (FEEDING SUPPLEMENT, PRO-STAT SUGAR FREE 64,) LIQD Take 30 mLs by mouth daily. To Promote Wound Healing. Wound on right lower leg   aspirin EC 81 MG tablet Take 1 tablet (81 mg total) by mouth daily.   Cholecalciferol (VITAMIN D3) 2000 units capsule Take 2,000 Units by mouth daily.     clopidogrel (PLAVIX) 75 MG tablet Take 1 tablet (75 mg total) by mouth daily.   ferrous sulfate 325 (65 FE) MG tablet Take 325 mg by mouth daily with breakfast.   Glucerna (GLUCERNA) LIQD Take 237 mLs by mouth 2 (two) times daily.   Infant Care Products (DERMACLOUD) CREA Apply liberal amount topically to area of skin irritation as needed.  OK to leave at bedside   Insulin Glargine (LANTUS SOLOSTAR) 100 UNIT/ML Solostar Pen Inject 8 Units into the skin at bedtime.    latanoprost (XALATAN) 0.005 % ophthalmic solution Place 1 drop into both eyes at bedtime.    levothyroxine (SYNTHROID) 75 MCG tablet Take 75 mcg by mouth daily before breakfast.   lisinopril (ZESTRIL) 30 MG tablet Take 30 mg by mouth daily.   magnesium hydroxide (MILK OF MAGNESIA) 400 MG/5ML suspension Take 30 mLs by mouth every 4 (four) hours as needed for mild constipation. Constipation/ no BM for 2 days   Magnesium Oxide 250 MG TABS Take 250 mg by mouth daily.   pantoprazole (PROTONIX) 40 MG tablet Take 40 mg by mouth daily.   polyethylene glycol (MIRALAX / GLYCOLAX) packet Take 17 g by mouth daily.   sitaGLIPtin (JANUVIA) 50 MG tablet Take 50 mg by mouth daily.   traZODone (DESYREL) 50 MG tablet Take 25 mg by mouth at bedtime. 0.5 tablet to = 25 mg   Trolamine Salicylate (ASPERCREME) 10 % LOTN Apply thin film topically to areas of pain 3 times a day as needed   UNABLE TO FIND Diet: regular. Low NA, NCS   vitamin B-12 (CYANOCOBALAMIN) 500 MCG tablet Take 500 mcg by mouth daily.   No facility-administered encounter medications on file as of 11/03/2018.     Review of Systems  GENERAL: No change in appetite, no fatigue, no weight changes, no fever, chills or weakness MOUTH and THROAT: Denies oral discomfort, gingival pain or bleeding RESPIRATORY: no cough, SOB, DOE, wheezing, hemoptysis CARDIAC: No chest pain, edema or palpitations GI: No abdominal pain, diarrhea, constipation, heart burn, nausea or  vomiting EXTREMITIES:  Able to move x4 extremities NEUROLGICAL: Denies dizziness, syncope, numbness, or headache PSYCHIATRIC: Denies feelings of depression or anxiety. No report of hallucinations, insomnia, paranoia, or agitation   Immunization History  Administered Date(s) Administered   Influenza,inj,Quad PF,6+ Mos 01/28/2015   Influenza-Unspecified 01/27/2015, 02/14/2016, 03/05/2018   PPD Test 12/28/2016   Pneumococcal Polysaccharide-23 01/21/2018   Td 07/08/2017   Pertinent  Health Maintenance Due  Topic Date Due   FOOT EXAM  03/27/1931   OPHTHALMOLOGY EXAM  03/27/1931   INFLUENZA VACCINE  12/27/2018   PNA vac Low Risk Adult (2 of 2 - PCV13) 01/22/2019   HEMOGLOBIN A1C  02/04/2019   DEXA SCAN  Discontinued   Fall Risk  09/04/2016  Falls in the past year? No     Vitals:   11/03/18 1056  BP: 102/64  Pulse: 77  Resp: 18  Temp: 97.8 F (36.6 C)  TempSrc: Oral  SpO2: 97%  Weight: 138 lb 6.4 oz (62.8 kg)  Height: 5\' 2"  (1.575 m)   Body mass index  is 25.31 kg/m.  Physical Exam  GENERAL APPEARANCE: Well nourished. In no acute distress. Normal body habitus SKIN:  Left lower leg wound covered with dressing MOUTH and THROAT: Lips are without lesions. Oral mucosa is moist and without lesions. Tongue is normal in shape, size, and color and without lesions RESPIRATORY: Breathing is even & unlabored, BS CTAB CARDIAC: RRR, no murmur,no extra heart sounds, no edema GI: Abdomen soft, normal BS, no masses, no tenderness EXTREMITIES:  Able to move X 4 extremities NEUROLOGICAL: There is no tremor. Speech is clear. Alert to self, disoriented to time and place. PSYCHIATRIC:  Affect and behavior are appropriate  Labs reviewed: Recent Labs    02/06/18 1120 07/04/18 1200 07/07/18 1256  NA 134* 133* 136  K 5.1 5.0 4.7  CL 101 102 103  CO2 24 21* 25  GLUCOSE 169* 345* 347*  BUN 33* 53* 40*  CREATININE 1.25* 1.39* 1.09*  CALCIUM 9.3 8.9 9.0   Recent Labs     07/04/18 1200 07/07/18 1256  AST 26 29  ALT 16 21  ALKPHOS 75 106  BILITOT 0.5 0.6  PROT 6.1* 6.6  ALBUMIN 3.1* 3.4*   Recent Labs    02/06/18 1120 07/04/18 1200 07/07/18 1256  WBC 6.3 8.7 5.3  NEUTROABS 3.8 6.0 3.1  HGB 10.6* 7.7* 7.8*  HCT 30.8* 26.5* 27.6*  MCV 95.1 93.3 95.8  PLT 229 207 215   Lab Results  Component Value Date   TSH 0.241 (L) 10/27/2018   Lab Results  Component Value Date   HGBA1C 8.7 (H) 08/04/2018   Lab Results  Component Value Date   CHOL 112 08/13/2017   HDL 45 08/13/2017   LDLCALC 39 08/13/2017   LDLDIRECT 58.0 09/04/2016   TRIG 142 08/13/2017   CHOLHDL 2.5 08/13/2017    Assessment/Plan  1. Atherosclerosis of native coronary artery of native heart without angina pectoris -Denies chest pain, continue aspirin 81 mg 1 tab daily and Plavix 75 mg daily  2. Hypothyroidism due to acquired atrophy of thyroid Lab Results  Component Value Date   TSH 0.241 (L) 10/27/2018  -Recently decreased levothyroxine from 88 mcg to 75 mcg daily and for repeat TSH in 6 weeks  3. Type 2 diabetes mellitus with stage 3 chronic kidney disease, without long-term current use of insulin (HCC) Lab Results  Component Value Date   HGBA1C 8.7 (H) 08/04/2018  -Well-controlled, continue Januvia 50 mg daily and Lantus 8 units subcutaneously nightly  4. Essential hypertension -Stable, continue lisinopril 30 mg 1 tab daily  5. Gastroesophageal reflux disease without esophagitis -Continue Protonix 40 mg 1 tab daily  6. Insomnia, unspecified type -Continue trazodone 50 mg 1/2 tab = 25 mg nightly   Family/ staff Communication: Discussed plan of care with resident.  Labs/tests ordered: None  Goals of care:   Long-term care.   Durenda Age, DNP, FNP-BC [facility] [phone] (Monday-Friday 8:00 a.m. - 5:00 p.m.) [phone] (after hours)

## 2018-11-04 DIAGNOSIS — Z20828 Contact with and (suspected) exposure to other viral communicable diseases: Secondary | ICD-10-CM | POA: Diagnosis not present

## 2018-11-05 ENCOUNTER — Other Ambulatory Visit
Admission: RE | Admit: 2018-11-05 | Discharge: 2018-11-05 | Disposition: A | Discharge status: Home / Self Care | Payer: Medicare Other | Source: Ambulatory Visit | Attending: Adult Health | Admitting: Adult Health

## 2018-11-05 DIAGNOSIS — E1122 Type 2 diabetes mellitus with diabetic chronic kidney disease: Secondary | ICD-10-CM | POA: Diagnosis not present

## 2018-11-05 DIAGNOSIS — R279 Unspecified lack of coordination: Secondary | ICD-10-CM | POA: Diagnosis not present

## 2018-11-05 DIAGNOSIS — Z89421 Acquired absence of other right toe(s): Secondary | ICD-10-CM | POA: Diagnosis not present

## 2018-11-05 DIAGNOSIS — H409 Unspecified glaucoma: Secondary | ICD-10-CM | POA: Diagnosis not present

## 2018-11-05 DIAGNOSIS — R41841 Cognitive communication deficit: Secondary | ICD-10-CM | POA: Diagnosis not present

## 2018-11-05 DIAGNOSIS — E1151 Type 2 diabetes mellitus with diabetic peripheral angiopathy without gangrene: Secondary | ICD-10-CM | POA: Diagnosis not present

## 2018-11-05 LAB — HEMOGLOBIN A1C
Hgb A1c MFr Bld: 7.2 % — ABNORMAL HIGH (ref 4.8–5.6)
Mean Plasma Glucose: 159.94 mg/dL

## 2018-11-07 DIAGNOSIS — E1151 Type 2 diabetes mellitus with diabetic peripheral angiopathy without gangrene: Secondary | ICD-10-CM | POA: Diagnosis not present

## 2018-11-07 DIAGNOSIS — H409 Unspecified glaucoma: Secondary | ICD-10-CM | POA: Diagnosis not present

## 2018-11-07 DIAGNOSIS — R41841 Cognitive communication deficit: Secondary | ICD-10-CM | POA: Diagnosis not present

## 2018-11-07 DIAGNOSIS — R279 Unspecified lack of coordination: Secondary | ICD-10-CM | POA: Diagnosis not present

## 2018-11-07 DIAGNOSIS — Z89421 Acquired absence of other right toe(s): Secondary | ICD-10-CM | POA: Diagnosis not present

## 2018-11-10 DIAGNOSIS — Z89421 Acquired absence of other right toe(s): Secondary | ICD-10-CM | POA: Diagnosis not present

## 2018-11-10 DIAGNOSIS — R279 Unspecified lack of coordination: Secondary | ICD-10-CM | POA: Diagnosis not present

## 2018-11-10 DIAGNOSIS — H409 Unspecified glaucoma: Secondary | ICD-10-CM | POA: Diagnosis not present

## 2018-11-10 DIAGNOSIS — E1151 Type 2 diabetes mellitus with diabetic peripheral angiopathy without gangrene: Secondary | ICD-10-CM | POA: Diagnosis not present

## 2018-11-10 DIAGNOSIS — R41841 Cognitive communication deficit: Secondary | ICD-10-CM | POA: Diagnosis not present

## 2018-11-12 DIAGNOSIS — E1151 Type 2 diabetes mellitus with diabetic peripheral angiopathy without gangrene: Secondary | ICD-10-CM | POA: Diagnosis not present

## 2018-11-12 DIAGNOSIS — H409 Unspecified glaucoma: Secondary | ICD-10-CM | POA: Diagnosis not present

## 2018-11-12 DIAGNOSIS — Z89421 Acquired absence of other right toe(s): Secondary | ICD-10-CM | POA: Diagnosis not present

## 2018-11-12 DIAGNOSIS — R279 Unspecified lack of coordination: Secondary | ICD-10-CM | POA: Diagnosis not present

## 2018-11-12 DIAGNOSIS — R41841 Cognitive communication deficit: Secondary | ICD-10-CM | POA: Diagnosis not present

## 2018-11-14 DIAGNOSIS — E1151 Type 2 diabetes mellitus with diabetic peripheral angiopathy without gangrene: Secondary | ICD-10-CM | POA: Diagnosis not present

## 2018-11-14 DIAGNOSIS — R279 Unspecified lack of coordination: Secondary | ICD-10-CM | POA: Diagnosis not present

## 2018-11-14 DIAGNOSIS — H409 Unspecified glaucoma: Secondary | ICD-10-CM | POA: Diagnosis not present

## 2018-11-14 DIAGNOSIS — R41841 Cognitive communication deficit: Secondary | ICD-10-CM | POA: Diagnosis not present

## 2018-11-14 DIAGNOSIS — Z89421 Acquired absence of other right toe(s): Secondary | ICD-10-CM | POA: Diagnosis not present

## 2018-11-17 DIAGNOSIS — E1151 Type 2 diabetes mellitus with diabetic peripheral angiopathy without gangrene: Secondary | ICD-10-CM | POA: Diagnosis not present

## 2018-11-17 DIAGNOSIS — Z89421 Acquired absence of other right toe(s): Secondary | ICD-10-CM | POA: Diagnosis not present

## 2018-11-17 DIAGNOSIS — H409 Unspecified glaucoma: Secondary | ICD-10-CM | POA: Diagnosis not present

## 2018-11-17 DIAGNOSIS — R279 Unspecified lack of coordination: Secondary | ICD-10-CM | POA: Diagnosis not present

## 2018-11-17 DIAGNOSIS — R41841 Cognitive communication deficit: Secondary | ICD-10-CM | POA: Diagnosis not present

## 2018-11-19 DIAGNOSIS — R41841 Cognitive communication deficit: Secondary | ICD-10-CM | POA: Diagnosis not present

## 2018-11-19 DIAGNOSIS — Z89421 Acquired absence of other right toe(s): Secondary | ICD-10-CM | POA: Diagnosis not present

## 2018-11-19 DIAGNOSIS — H409 Unspecified glaucoma: Secondary | ICD-10-CM | POA: Diagnosis not present

## 2018-11-19 DIAGNOSIS — R279 Unspecified lack of coordination: Secondary | ICD-10-CM | POA: Diagnosis not present

## 2018-11-19 DIAGNOSIS — E1151 Type 2 diabetes mellitus with diabetic peripheral angiopathy without gangrene: Secondary | ICD-10-CM | POA: Diagnosis not present

## 2018-11-20 DIAGNOSIS — Z89421 Acquired absence of other right toe(s): Secondary | ICD-10-CM | POA: Diagnosis not present

## 2018-11-20 DIAGNOSIS — R279 Unspecified lack of coordination: Secondary | ICD-10-CM | POA: Diagnosis not present

## 2018-11-20 DIAGNOSIS — H409 Unspecified glaucoma: Secondary | ICD-10-CM | POA: Diagnosis not present

## 2018-11-20 DIAGNOSIS — E1151 Type 2 diabetes mellitus with diabetic peripheral angiopathy without gangrene: Secondary | ICD-10-CM | POA: Diagnosis not present

## 2018-11-20 DIAGNOSIS — R41841 Cognitive communication deficit: Secondary | ICD-10-CM | POA: Diagnosis not present

## 2018-11-21 DIAGNOSIS — R279 Unspecified lack of coordination: Secondary | ICD-10-CM | POA: Diagnosis not present

## 2018-11-21 DIAGNOSIS — Z89421 Acquired absence of other right toe(s): Secondary | ICD-10-CM | POA: Diagnosis not present

## 2018-11-21 DIAGNOSIS — E1151 Type 2 diabetes mellitus with diabetic peripheral angiopathy without gangrene: Secondary | ICD-10-CM | POA: Diagnosis not present

## 2018-11-21 DIAGNOSIS — R41841 Cognitive communication deficit: Secondary | ICD-10-CM | POA: Diagnosis not present

## 2018-11-21 DIAGNOSIS — H409 Unspecified glaucoma: Secondary | ICD-10-CM | POA: Diagnosis not present

## 2018-11-24 ENCOUNTER — Encounter (INDEPENDENT_AMBULATORY_CARE_PROVIDER_SITE_OTHER): Payer: Medicare Other

## 2018-11-24 ENCOUNTER — Ambulatory Visit (INDEPENDENT_AMBULATORY_CARE_PROVIDER_SITE_OTHER): Payer: Medicare Other | Admitting: Nurse Practitioner

## 2018-11-24 DIAGNOSIS — R279 Unspecified lack of coordination: Secondary | ICD-10-CM | POA: Diagnosis not present

## 2018-11-24 DIAGNOSIS — H409 Unspecified glaucoma: Secondary | ICD-10-CM | POA: Diagnosis not present

## 2018-11-24 DIAGNOSIS — E1151 Type 2 diabetes mellitus with diabetic peripheral angiopathy without gangrene: Secondary | ICD-10-CM | POA: Diagnosis not present

## 2018-11-24 DIAGNOSIS — R41841 Cognitive communication deficit: Secondary | ICD-10-CM | POA: Diagnosis not present

## 2018-11-24 DIAGNOSIS — Z89421 Acquired absence of other right toe(s): Secondary | ICD-10-CM | POA: Diagnosis not present

## 2018-11-25 DIAGNOSIS — Z89421 Acquired absence of other right toe(s): Secondary | ICD-10-CM | POA: Diagnosis not present

## 2018-11-25 DIAGNOSIS — H409 Unspecified glaucoma: Secondary | ICD-10-CM | POA: Diagnosis not present

## 2018-11-25 DIAGNOSIS — R41841 Cognitive communication deficit: Secondary | ICD-10-CM | POA: Diagnosis not present

## 2018-11-25 DIAGNOSIS — R279 Unspecified lack of coordination: Secondary | ICD-10-CM | POA: Diagnosis not present

## 2018-11-25 DIAGNOSIS — E1151 Type 2 diabetes mellitus with diabetic peripheral angiopathy without gangrene: Secondary | ICD-10-CM | POA: Diagnosis not present

## 2018-11-26 ENCOUNTER — Inpatient Hospital Stay: Admit: 2018-11-26 | Payer: Medicare Other

## 2018-11-26 ENCOUNTER — Encounter
Admission: RE | Admit: 2018-11-26 | Discharge: 2018-11-26 | Disposition: A | Discharge status: Home / Self Care | Payer: Medicare Other | Source: Ambulatory Visit | Attending: Internal Medicine | Admitting: Internal Medicine

## 2018-11-26 DIAGNOSIS — R279 Unspecified lack of coordination: Secondary | ICD-10-CM | POA: Diagnosis not present

## 2018-11-26 DIAGNOSIS — Z89421 Acquired absence of other right toe(s): Secondary | ICD-10-CM | POA: Diagnosis not present

## 2018-11-26 DIAGNOSIS — E1151 Type 2 diabetes mellitus with diabetic peripheral angiopathy without gangrene: Secondary | ICD-10-CM | POA: Diagnosis not present

## 2018-11-26 DIAGNOSIS — H409 Unspecified glaucoma: Secondary | ICD-10-CM | POA: Diagnosis not present

## 2018-11-26 DIAGNOSIS — R41841 Cognitive communication deficit: Secondary | ICD-10-CM | POA: Diagnosis not present

## 2018-11-27 DIAGNOSIS — R41841 Cognitive communication deficit: Secondary | ICD-10-CM | POA: Diagnosis not present

## 2018-11-27 DIAGNOSIS — R279 Unspecified lack of coordination: Secondary | ICD-10-CM | POA: Diagnosis not present

## 2018-11-27 DIAGNOSIS — H409 Unspecified glaucoma: Secondary | ICD-10-CM | POA: Diagnosis not present

## 2018-11-27 DIAGNOSIS — Z89421 Acquired absence of other right toe(s): Secondary | ICD-10-CM | POA: Diagnosis not present

## 2018-11-27 DIAGNOSIS — E1151 Type 2 diabetes mellitus with diabetic peripheral angiopathy without gangrene: Secondary | ICD-10-CM | POA: Diagnosis not present

## 2018-11-28 DIAGNOSIS — Z89421 Acquired absence of other right toe(s): Secondary | ICD-10-CM | POA: Diagnosis not present

## 2018-11-28 DIAGNOSIS — R41841 Cognitive communication deficit: Secondary | ICD-10-CM | POA: Diagnosis not present

## 2018-11-28 DIAGNOSIS — H409 Unspecified glaucoma: Secondary | ICD-10-CM | POA: Diagnosis not present

## 2018-11-28 DIAGNOSIS — R279 Unspecified lack of coordination: Secondary | ICD-10-CM | POA: Diagnosis not present

## 2018-11-28 DIAGNOSIS — E1151 Type 2 diabetes mellitus with diabetic peripheral angiopathy without gangrene: Secondary | ICD-10-CM | POA: Diagnosis not present

## 2018-12-01 DIAGNOSIS — R279 Unspecified lack of coordination: Secondary | ICD-10-CM | POA: Diagnosis not present

## 2018-12-01 DIAGNOSIS — H409 Unspecified glaucoma: Secondary | ICD-10-CM | POA: Diagnosis not present

## 2018-12-01 DIAGNOSIS — R41841 Cognitive communication deficit: Secondary | ICD-10-CM | POA: Diagnosis not present

## 2018-12-01 DIAGNOSIS — Z89421 Acquired absence of other right toe(s): Secondary | ICD-10-CM | POA: Diagnosis not present

## 2018-12-01 DIAGNOSIS — E1151 Type 2 diabetes mellitus with diabetic peripheral angiopathy without gangrene: Secondary | ICD-10-CM | POA: Diagnosis not present

## 2018-12-02 DIAGNOSIS — H409 Unspecified glaucoma: Secondary | ICD-10-CM | POA: Diagnosis not present

## 2018-12-02 DIAGNOSIS — R279 Unspecified lack of coordination: Secondary | ICD-10-CM | POA: Diagnosis not present

## 2018-12-02 DIAGNOSIS — R41841 Cognitive communication deficit: Secondary | ICD-10-CM | POA: Diagnosis not present

## 2018-12-02 DIAGNOSIS — Z89421 Acquired absence of other right toe(s): Secondary | ICD-10-CM | POA: Diagnosis not present

## 2018-12-02 DIAGNOSIS — E1151 Type 2 diabetes mellitus with diabetic peripheral angiopathy without gangrene: Secondary | ICD-10-CM | POA: Diagnosis not present

## 2018-12-03 ENCOUNTER — Ambulatory Visit (INDEPENDENT_AMBULATORY_CARE_PROVIDER_SITE_OTHER): Payer: Medicare Other

## 2018-12-03 ENCOUNTER — Ambulatory Visit (INDEPENDENT_AMBULATORY_CARE_PROVIDER_SITE_OTHER): Payer: Medicare Other | Admitting: Nurse Practitioner

## 2018-12-03 ENCOUNTER — Encounter (INDEPENDENT_AMBULATORY_CARE_PROVIDER_SITE_OTHER): Payer: Self-pay | Admitting: Nurse Practitioner

## 2018-12-03 ENCOUNTER — Other Ambulatory Visit: Payer: Self-pay

## 2018-12-03 VITALS — BP 154/76 | HR 80 | Resp 12 | Ht 65.0 in | Wt 130.0 lb

## 2018-12-03 DIAGNOSIS — L97909 Non-pressure chronic ulcer of unspecified part of unspecified lower leg with unspecified severity: Secondary | ICD-10-CM

## 2018-12-03 DIAGNOSIS — R279 Unspecified lack of coordination: Secondary | ICD-10-CM | POA: Diagnosis not present

## 2018-12-03 DIAGNOSIS — Z79899 Other long term (current) drug therapy: Secondary | ICD-10-CM

## 2018-12-03 DIAGNOSIS — E785 Hyperlipidemia, unspecified: Secondary | ICD-10-CM | POA: Diagnosis not present

## 2018-12-03 DIAGNOSIS — Z89421 Acquired absence of other right toe(s): Secondary | ICD-10-CM | POA: Diagnosis not present

## 2018-12-03 DIAGNOSIS — E1151 Type 2 diabetes mellitus with diabetic peripheral angiopathy without gangrene: Secondary | ICD-10-CM | POA: Diagnosis not present

## 2018-12-03 DIAGNOSIS — I739 Peripheral vascular disease, unspecified: Secondary | ICD-10-CM | POA: Diagnosis not present

## 2018-12-03 DIAGNOSIS — R41841 Cognitive communication deficit: Secondary | ICD-10-CM | POA: Diagnosis not present

## 2018-12-03 DIAGNOSIS — K219 Gastro-esophageal reflux disease without esophagitis: Secondary | ICD-10-CM | POA: Diagnosis not present

## 2018-12-03 DIAGNOSIS — H409 Unspecified glaucoma: Secondary | ICD-10-CM | POA: Diagnosis not present

## 2018-12-04 DIAGNOSIS — R41841 Cognitive communication deficit: Secondary | ICD-10-CM | POA: Diagnosis not present

## 2018-12-04 DIAGNOSIS — E1151 Type 2 diabetes mellitus with diabetic peripheral angiopathy without gangrene: Secondary | ICD-10-CM | POA: Diagnosis not present

## 2018-12-04 DIAGNOSIS — R279 Unspecified lack of coordination: Secondary | ICD-10-CM | POA: Diagnosis not present

## 2018-12-04 DIAGNOSIS — Z89421 Acquired absence of other right toe(s): Secondary | ICD-10-CM | POA: Diagnosis not present

## 2018-12-04 DIAGNOSIS — H409 Unspecified glaucoma: Secondary | ICD-10-CM | POA: Diagnosis not present

## 2018-12-05 DIAGNOSIS — H409 Unspecified glaucoma: Secondary | ICD-10-CM | POA: Diagnosis not present

## 2018-12-05 DIAGNOSIS — R279 Unspecified lack of coordination: Secondary | ICD-10-CM | POA: Diagnosis not present

## 2018-12-05 DIAGNOSIS — E1151 Type 2 diabetes mellitus with diabetic peripheral angiopathy without gangrene: Secondary | ICD-10-CM | POA: Diagnosis not present

## 2018-12-05 DIAGNOSIS — Z89421 Acquired absence of other right toe(s): Secondary | ICD-10-CM | POA: Diagnosis not present

## 2018-12-05 DIAGNOSIS — R41841 Cognitive communication deficit: Secondary | ICD-10-CM | POA: Diagnosis not present

## 2018-12-07 ENCOUNTER — Encounter (INDEPENDENT_AMBULATORY_CARE_PROVIDER_SITE_OTHER): Payer: Self-pay | Admitting: Nurse Practitioner

## 2018-12-07 NOTE — Progress Notes (Signed)
SUBJECTIVE:  Patient ID: Ashley [name], female    DOB: [birth year], 85 y.o.   MRN: [id number] Chief Complaint  Patient presents with  . Follow-up    HPI  Ashley [name] is a 85 y.o. female The patient returns to the office for followup and review of the noninvasive studies. There have been no interval changes in lower extremity symptoms. No interval shortening of the patient's claudication distance or development of rest pain symptoms. No new ulcers or wounds have occurred since the last visit.  There have been no significant changes to the patient's overall health care.  The patient denies amaurosis fugax or recent TIA symptoms. There are no recent neurological changes noted. The patient denies history of DVT, PE or superficial thrombophlebitis. The patient denies recent episodes of angina or shortness of breath.   ABI Rt=1.32 and Lt=[place]  (previous ABI's Rt=1.24 and Lt=[place]) Duplex ultrasound of the right lower extremity reveals monophasic waveforms and triphasic waveforrms on the left.  Strong toe waveforms bilaterally.   Past Medical History:  Diagnosis Date  . Acute gouty arthritis 11/21/2016  . Aortic atherosclerosis ([place]) 09/04/2016  . Carotid artery disease ([place]) 11/21/2016  . Carotid stenosis 12/03/2015   Advanced heavily calcified atherosclerotic disease at both carotid bifurcations. Stenoses measured at 70% affecting the right proximal ICA and 80% affecting the left proximal ICA.  Marland Kitchen Chronic kidney disease   . Coronary atherosclerosis 09/04/2016  . Diverticulitis   . DM (diabetes mellitus), type 2 ([place]) 09/04/2016  . GERD (gastroesophageal reflux disease)   . HTN, goal below 140/90 11/21/2016  . Hyperlipidemia 06/20/2016  . Hypothyroidism, unspecified 11/21/2016  . Primary osteoarthritis 11/21/2016   involving multiple joints  . Stroke [facility])     Past Surgical History:  Procedure Laterality Date  . AMPUTATION Right 02/13/2018   Procedure: AMPUTATION RAY ( 2ND TOE );  Surgeon:  [name], MD;  Location: [facility] ORS;  Service: Vascular;  Laterality: Right;  . CATARACT EXTRACTION Bilateral   . COLONOSCOPY    . Finger crystal removal     right 3rd  . LOWER EXTREMITY ANGIOGRAPHY Right 01/06/2018   Procedure: LOWER EXTREMITY ANGIOGRAPHY;  Surgeon: [name], MD;  Location: [facility];  Service: Cardiovascular;  Laterality: Right;  . toe crystal removal     right 1st toe    Social History   Socioeconomic History  . Marital status: Widowed    Spouse name: Not on file  . Number of children: 0  . Years of education: 10  . Highest education level: 10th grade  Occupational History  . Not on file  Social Needs  . Financial resource strain: Not on file  . Food insecurity    Worry: Not on file    Inability: Not on file  . Transportation needs    Medical: Not on file    Non-medical: Not on file  Tobacco Use  . Smoking status: Never Smoker  . Smokeless tobacco: Never Used  Substance and Sexual Activity  . Alcohol use: No  . Drug use: No  . Sexual activity: Not Currently  Lifestyle  . Physical activity    Days per week: Not on file    Minutes per session: Not on file  . Stress: Not on file  Relationships  . Social Herbalist on phone: Not on file    Gets together: Not on file    Attends religious service: Not on file    Active  member of club or organization: Not on file    Attends meetings of clubs or organizations: Not on file    Relationship status: Not on file  . Intimate partner violence    Fear of current or ex partner: Not on file    Emotionally abused: Not on file    Physically abused: Not on file    Forced sexual activity: Not on file  Other Topics Concern  . Not on file  Social History Narrative   Admitted to [facility] 11/16/2016   Widowed   No children   Does not drink alcohol   Never smoker   DNR    Family History  Problem Relation Age of Onset  . Hypertension Sister   . Arthritis Sister   .  Heart disease Sister   . Alcohol abuse Brother   . Stroke Brother   . Hypertension Brother   . Alcohol abuse Brother   . Stroke Brother   . Hypertension Brother   . Hypertension Brother   . Hypertension Sister     No Known Allergies   Review of Systems   Review of Systems: Negative Unless Checked Constitutional: [] Weight loss  [] Fever  [] Chills Cardiac: [] Chest pain   []  Atrial Fibrillation  [] Palpitations   [] Shortness of breath when laying flat   [] Shortness of breath with exertion. [] Shortness of breath at rest Vascular:  [] Pain in legs with walking   [] Pain in legs with standing [] Pain in legs when laying flat   [x] Claudication    [] Pain in feet when laying flat    [] History of DVT   [] Phlebitis   [x] Swelling in legs   [] Varicose veins   [] Non-healing ulcers Pulmonary:   [] Uses home oxygen   [] Productive cough   [] Hemoptysis   [] Wheeze  [] COPD   [] Asthma Neurologic:  [] Dizziness   [] Seizures  [] Blackouts [x] History of stroke   [] History of TIA  [] Aphasia   [] Temporary Blindness   [] Weakness or numbness in arm   [] Weakness or numbness in leg Musculoskeletal:   [] Joint swelling   [] Joint pain   [] Low back pain  []  History of Knee Replacement [x] Arthritis [] back Surgeries  []  Spinal Stenosis    Hematologic:  [] Easy bruising  [] Easy bleeding   [] Hypercoagulable state   [] Anemic Gastrointestinal:  [] Diarrhea   [] Vomiting  [x] Gastroesophageal reflux/heartburn   [] Difficulty swallowing. [] Abdominal pain Genitourinary:  [] Chronic kidney disease   [] Difficult urination  [] Anuric   [] Blood in urine [] Frequent urination  [] Burning with urination   [] Hematuria Skin:  [] Rashes   [] Ulcers [] Wounds Psychological:  [] History of anxiety   []  History of major depression  []  Memory Difficulties      OBJECTIVE:   Physical Exam  BP (!) 154/76 (BP Location: Left Arm, Patient Position: Sitting, Cuff Size: Normal)   Pulse 80   Resp 12   Ht 5\' 5"  (1.651 m)   Wt 130 lb (59 kg)   BMI 21.63 kg/m    Gen: WD/WN, NAD Head: [place]/AT, No temporalis wasting.  Ear/Nose/Throat: HOH, nares w/o erythema or drainage Eyes: PER, EOMI, sclera nonicteric.  Neck: Supple, no masses.  No JVD.  Pulmonary:  Good air movement, no use of accessory muscles.  Cardiac: RRR Vascular:  Vessel Right Left  Radial Palpable Palpable  Dorsalis Pedis Not Palpable Not Palpable  Posterior Tibial Not Palpable Not Palpable   Gastrointestinal: soft, non-distended. No guarding/no peritoneal signs.  Musculoskeletal: M/S 5/5 throughout.  No deformity or atrophy. Wheelchair bound. Neurologic: Pain and light  touch intact in extremities.  Symmetrical.  Speech is fluent. Motor exam as listed above. Psychiatric: Judgment intact, Mood & affect appropriate for pt's clinical situation. Dermatologic: No Venous rashes. No Ulcers Noted.  No changes consistent with cellulitis. Lymph : No Cervical lymphadenopathy, no lichenification or skin changes of chronic lymphedema.       ASSESSMENT AND PLAN:  1. Peripheral vascular disease of lower extremity with ulceration (HCC)  Recommend:  The patient has evidence of atherosclerosis of the lower extremities with claudication.  The patient does not voice lifestyle limiting changes at this point in time.  Noninvasive studies do not suggest clinically significant change.  No invasive studies, angiography or surgery at this time The patient should continue walking and begin a more formal exercise program.  The patient should continue antiplatelet therapy and aggressive treatment of the lipid abnormalities  No changes in the patient's medications at this time  The patient should continue wearing graduated compression socks 10-15 mmHg strength to control the mild edema.    Patient should follow up in six months  - VAS [place] ABI WITH/WO TBI; Future  2. Hyperlipidemia, unspecified hyperlipidemia type Continue statin as ordered and reviewed, no changes at this time   3. Gastroesophageal  reflux disease without esophagitis Continue PPI as already ordered, this medication has been reviewed and there are no changes at this time.  Avoidence of caffeine and alcohol  Moderate elevation of the head of the bed    Current Outpatient Medications on File Prior to Visit  Medication Sig Dispense Refill  . acetaminophen (MAPAP ARTHRITIS PAIN) 650 MG CR tablet Take 650 mg 2 (two) times daily by mouth.     Marland Kitchen acetaminophen (TYLENOL) 325 MG tablet Take 650 mg by mouth every 4 (four) hours as needed. for pain/ increased temp. May be administered orally, per G-tube if needed or rectally if unable to swallow (separate order). Maximum dose for 24 hours is 3,000 mg from all sources of Acetaminophen/ Tylenol    . alum & mag hydroxide-simeth (MAALOX PLUS) 400-400-40 MG/5ML suspension Take 30 mLs by mouth every 4 (four) hours as needed for indigestion.    Marland Kitchen aspirin EC 81 MG tablet Take 1 tablet (81 mg total) by mouth daily. 150 tablet 2  . Cholecalciferol (VITAMIN D3) 2000 units capsule Take 2,000 Units by mouth daily.     . clopidogrel (PLAVIX) 75 MG tablet Take 1 tablet (75 mg total) by mouth daily. 30 tablet 11  . ferrous sulfate 325 (65 FE) MG tablet Take 325 mg by mouth daily with breakfast.    . Insulin Glargine (LANTUS SOLOSTAR) 100 UNIT/ML Solostar Pen Inject 8 Units into the skin at bedtime.     Marland Kitchen latanoprost (XALATAN) 0.005 % ophthalmic solution Place 1 drop into both eyes at bedtime.     Marland Kitchen levothyroxine (SYNTHROID) 75 MCG tablet Take 75 mcg by mouth daily before breakfast.    . lisinopril (ZESTRIL) 30 MG tablet Take 30 mg by mouth daily.    . magnesium hydroxide (MILK OF MAGNESIA) 400 MG/5ML suspension Take 30 mLs by mouth every 4 (four) hours as needed for mild constipation. Constipation/ no BM for 2 days    . Magnesium Oxide 250 MG TABS Take 250 mg by mouth daily.    . pantoprazole (PROTONIX) 40 MG tablet Take 40 mg by mouth daily.    . polyethylene glycol (MIRALAX / GLYCOLAX) packet Take  17 g by mouth daily.    . sitaGLIPtin (JANUVIA) 50 MG tablet  Take 50 mg by mouth daily.    . traZODone (DESYREL) 50 MG tablet Take 25 mg by mouth at bedtime. 0.5 tablet to = 25 mg    . Trolamine Salicylate (ASPERCREME) 10 % LOTN Apply thin film topically to areas of pain 3 times a day as needed    . vitamin B-12 (CYANOCOBALAMIN) 500 MCG tablet Take 500 mcg by mouth daily.    . Amino Acids-Protein Hydrolys (FEEDING SUPPLEMENT, PRO-STAT SUGAR FREE 64,) LIQD Take 30 mLs by mouth daily. To Promote Wound Healing. Wound on right lower leg    . Glucerna (GLUCERNA) LIQD Take 237 mLs by mouth 2 (two) times daily.    . [place] (DERMACLOUD) CREA Apply liberal amount topically to area of skin irritation as needed.  OK to leave at bedside    . UNABLE TO FIND Diet: regular. Low NA, NCS     No current facility-administered medications on file prior to visit.     There are no Patient Instructions on file for this visit. No follow-ups on file.   Kris Hartmann, NP  This note was completed with Sales executive.  Any errors are purely unintentional.

## 2018-12-08 DIAGNOSIS — E1151 Type 2 diabetes mellitus with diabetic peripheral angiopathy without gangrene: Secondary | ICD-10-CM | POA: Diagnosis not present

## 2018-12-08 DIAGNOSIS — R41841 Cognitive communication deficit: Secondary | ICD-10-CM | POA: Diagnosis not present

## 2018-12-08 DIAGNOSIS — R279 Unspecified lack of coordination: Secondary | ICD-10-CM | POA: Diagnosis not present

## 2018-12-08 DIAGNOSIS — Z89421 Acquired absence of other right toe(s): Secondary | ICD-10-CM | POA: Diagnosis not present

## 2018-12-08 DIAGNOSIS — H409 Unspecified glaucoma: Secondary | ICD-10-CM | POA: Diagnosis not present

## 2018-12-09 DIAGNOSIS — Z89421 Acquired absence of other right toe(s): Secondary | ICD-10-CM | POA: Diagnosis not present

## 2018-12-09 DIAGNOSIS — R279 Unspecified lack of coordination: Secondary | ICD-10-CM | POA: Diagnosis not present

## 2018-12-09 DIAGNOSIS — H409 Unspecified glaucoma: Secondary | ICD-10-CM | POA: Diagnosis not present

## 2018-12-09 DIAGNOSIS — R41841 Cognitive communication deficit: Secondary | ICD-10-CM | POA: Diagnosis not present

## 2018-12-09 DIAGNOSIS — E1151 Type 2 diabetes mellitus with diabetic peripheral angiopathy without gangrene: Secondary | ICD-10-CM | POA: Diagnosis not present

## 2018-12-10 ENCOUNTER — Other Ambulatory Visit
Admission: RE | Admit: 2018-12-10 | Discharge: 2018-12-10 | Disposition: A | Discharge status: Home / Self Care | Payer: Medicare Other | Source: Ambulatory Visit | Attending: Internal Medicine | Admitting: Internal Medicine

## 2018-12-10 ENCOUNTER — Other Ambulatory Visit
Admission: RE | Admit: 2018-12-10 | Discharge: 2018-12-10 | Disposition: A | Discharge status: Home / Self Care | Payer: Medicare Other | Source: Skilled Nursing Facility | Attending: Adult Health | Admitting: Adult Health

## 2018-12-10 DIAGNOSIS — N390 Urinary tract infection, site not specified: Secondary | ICD-10-CM | POA: Diagnosis not present

## 2018-12-10 DIAGNOSIS — E034 Atrophy of thyroid (acquired): Secondary | ICD-10-CM | POA: Diagnosis not present

## 2018-12-10 DIAGNOSIS — Z20828 Contact with and (suspected) exposure to other viral communicable diseases: Secondary | ICD-10-CM | POA: Diagnosis not present

## 2018-12-10 DIAGNOSIS — R0602 Shortness of breath: Secondary | ICD-10-CM | POA: Diagnosis not present

## 2018-12-10 DIAGNOSIS — R112 Nausea with vomiting, unspecified: Secondary | ICD-10-CM | POA: Diagnosis not present

## 2018-12-10 DIAGNOSIS — K219 Gastro-esophageal reflux disease without esophagitis: Secondary | ICD-10-CM | POA: Diagnosis not present

## 2018-12-10 DIAGNOSIS — R4182 Altered mental status, unspecified: Secondary | ICD-10-CM | POA: Insufficient documentation

## 2018-12-10 DIAGNOSIS — R109 Unspecified abdominal pain: Secondary | ICD-10-CM | POA: Diagnosis not present

## 2018-12-10 DIAGNOSIS — J189 Pneumonia, unspecified organism: Secondary | ICD-10-CM | POA: Diagnosis not present

## 2018-12-10 LAB — CBC WITH DIFFERENTIAL/PLATELET
Abs Immature Granulocytes: 0.05 10*3/uL (ref 0.00–0.07)
Basophils Absolute: 0 10*3/uL (ref 0.0–0.1)
Basophils Relative: 1 %
Eosinophils Absolute: 0 10*3/uL (ref 0.0–0.5)
Eosinophils Relative: 1 %
HCT: 34.6 % — ABNORMAL LOW (ref 36.0–46.0)
Hemoglobin: 10.9 g/dL — ABNORMAL LOW (ref 12.0–15.0)
Immature Granulocytes: 1 %
Lymphocytes Relative: 21 %
Lymphs Abs: 1 10*3/uL (ref 0.7–4.0)
MCH: 32.9 pg (ref 26.0–34.0)
MCHC: 31.5 g/dL (ref 30.0–36.0)
MCV: 104.5 fL — ABNORMAL HIGH (ref 80.0–100.0)
Monocytes Absolute: 0.7 10*3/uL (ref 0.1–1.0)
Monocytes Relative: 16 %
Neutro Abs: 2.8 10*3/uL (ref 1.7–7.7)
Neutrophils Relative %: 60 %
Platelets: 206 10*3/uL (ref 150–400)
RBC: 3.31 MIL/uL — ABNORMAL LOW (ref 3.87–5.11)
RDW: 13.6 % (ref 11.5–15.5)
WBC: 4.6 10*3/uL (ref 4.0–10.5)
nRBC: 0 % (ref 0.0–0.2)

## 2018-12-10 LAB — COMPREHENSIVE METABOLIC PANEL
ALT: 24 U/L (ref 0–44)
AST: 34 U/L (ref 15–41)
Albumin: 3.6 g/dL (ref 3.5–5.0)
Alkaline Phosphatase: 105 U/L (ref 38–126)
Anion gap: 8 (ref 5–15)
BUN: 62 mg/dL — ABNORMAL HIGH (ref 8–23)
CO2: 21 mmol/L — ABNORMAL LOW (ref 22–32)
Calcium: 9.3 mg/dL (ref 8.9–10.3)
Chloride: 109 mmol/L (ref 98–111)
Creatinine, Ser: 2.13 mg/dL — ABNORMAL HIGH (ref 0.44–1.00)
GFR calc Af Amer: 22 mL/min — ABNORMAL LOW (ref >60)
GFR calc non Af Amer: 19 mL/min — ABNORMAL LOW (ref >60)
Glucose, Bld: 130 mg/dL — ABNORMAL HIGH (ref 70–99)
Potassium: 4.7 mmol/L (ref 3.5–5.1)
Sodium: 138 mmol/L (ref 135–145)
Total Bilirubin: 1 mg/dL (ref 0.3–1.2)
Total Protein: 7.4 g/dL (ref 6.5–8.1)

## 2018-12-10 LAB — URINALYSIS, ROUTINE W REFLEX MICROSCOPIC
Bilirubin Urine: NEGATIVE
Glucose, UA: NEGATIVE mg/dL
Hgb urine dipstick: NEGATIVE
Ketones, ur: NEGATIVE mg/dL
Nitrite: NEGATIVE
Protein, ur: 30 mg/dL — AB
Specific Gravity, Urine: 1.015 (ref 1.005–1.030)
Squamous Epithelial / HPF: NONE SEEN (ref 0–5)
WBC, UA: >50 WBC/hpf — ABNORMAL HIGH (ref 0–5)
pH: 7 (ref 5.0–8.0)

## 2018-12-10 LAB — TSH: TSH: 0.244 u[IU]/mL — ABNORMAL LOW (ref 0.350–4.500)

## 2018-12-11 DIAGNOSIS — J189 Pneumonia, unspecified organism: Secondary | ICD-10-CM | POA: Diagnosis not present

## 2018-12-11 DIAGNOSIS — H409 Unspecified glaucoma: Secondary | ICD-10-CM | POA: Diagnosis not present

## 2018-12-11 DIAGNOSIS — N390 Urinary tract infection, site not specified: Secondary | ICD-10-CM | POA: Diagnosis not present

## 2018-12-11 DIAGNOSIS — Z89421 Acquired absence of other right toe(s): Secondary | ICD-10-CM | POA: Diagnosis not present

## 2018-12-11 DIAGNOSIS — R41841 Cognitive communication deficit: Secondary | ICD-10-CM | POA: Diagnosis not present

## 2018-12-11 DIAGNOSIS — E1151 Type 2 diabetes mellitus with diabetic peripheral angiopathy without gangrene: Secondary | ICD-10-CM | POA: Diagnosis not present

## 2018-12-11 DIAGNOSIS — R279 Unspecified lack of coordination: Secondary | ICD-10-CM | POA: Diagnosis not present

## 2018-12-12 LAB — URINE CULTURE: Culture: >=100000 — AB

## 2018-12-15 DIAGNOSIS — R279 Unspecified lack of coordination: Secondary | ICD-10-CM | POA: Diagnosis not present

## 2018-12-15 DIAGNOSIS — H409 Unspecified glaucoma: Secondary | ICD-10-CM | POA: Diagnosis not present

## 2018-12-15 DIAGNOSIS — Z89421 Acquired absence of other right toe(s): Secondary | ICD-10-CM | POA: Diagnosis not present

## 2018-12-15 DIAGNOSIS — R41841 Cognitive communication deficit: Secondary | ICD-10-CM | POA: Diagnosis not present

## 2018-12-15 DIAGNOSIS — E1151 Type 2 diabetes mellitus with diabetic peripheral angiopathy without gangrene: Secondary | ICD-10-CM | POA: Diagnosis not present

## 2018-12-17 DIAGNOSIS — H409 Unspecified glaucoma: Secondary | ICD-10-CM | POA: Diagnosis not present

## 2018-12-17 DIAGNOSIS — E1151 Type 2 diabetes mellitus with diabetic peripheral angiopathy without gangrene: Secondary | ICD-10-CM | POA: Diagnosis not present

## 2018-12-17 DIAGNOSIS — R41841 Cognitive communication deficit: Secondary | ICD-10-CM | POA: Diagnosis not present

## 2018-12-17 DIAGNOSIS — R279 Unspecified lack of coordination: Secondary | ICD-10-CM | POA: Diagnosis not present

## 2018-12-17 DIAGNOSIS — Z89421 Acquired absence of other right toe(s): Secondary | ICD-10-CM | POA: Diagnosis not present

## 2018-12-18 DIAGNOSIS — R112 Nausea with vomiting, unspecified: Secondary | ICD-10-CM | POA: Diagnosis not present

## 2018-12-18 DIAGNOSIS — R197 Diarrhea, unspecified: Secondary | ICD-10-CM | POA: Diagnosis not present

## 2018-12-22 ENCOUNTER — Other Ambulatory Visit
Admission: RE | Admit: 2018-12-22 | Discharge: 2018-12-22 | Disposition: A | Discharge status: Home / Self Care | Payer: Medicare Other | Source: Skilled Nursing Facility | Attending: Internal Medicine | Admitting: Internal Medicine

## 2018-12-22 DIAGNOSIS — R112 Nausea with vomiting, unspecified: Secondary | ICD-10-CM | POA: Diagnosis not present

## 2018-12-22 DIAGNOSIS — R638 Other symptoms and signs concerning food and fluid intake: Secondary | ICD-10-CM | POA: Diagnosis not present

## 2018-12-22 DIAGNOSIS — R110 Nausea: Secondary | ICD-10-CM | POA: Diagnosis not present

## 2018-12-22 LAB — BASIC METABOLIC PANEL
Anion gap: 8 (ref 5–15)
BUN: 52 mg/dL — ABNORMAL HIGH (ref 8–23)
CO2: 20 mmol/L — ABNORMAL LOW (ref 22–32)
Calcium: 8.8 mg/dL — ABNORMAL LOW (ref 8.9–10.3)
Chloride: 106 mmol/L (ref 98–111)
Creatinine, Ser: 2.37 mg/dL — ABNORMAL HIGH (ref 0.44–1.00)
GFR calc Af Amer: 19 mL/min — ABNORMAL LOW (ref >60)
GFR calc non Af Amer: 17 mL/min — ABNORMAL LOW (ref >60)
Glucose, Bld: 162 mg/dL — ABNORMAL HIGH (ref 70–99)
Potassium: 5.2 mmol/L — ABNORMAL HIGH (ref 3.5–5.1)
Sodium: 134 mmol/L — ABNORMAL LOW (ref 135–145)

## 2018-12-22 LAB — CBC WITH DIFFERENTIAL/PLATELET
Abs Immature Granulocytes: 0.16 10*3/uL — ABNORMAL HIGH (ref 0.00–0.07)
Basophils Absolute: 0.1 10*3/uL (ref 0.0–0.1)
Basophils Relative: 1 %
Eosinophils Absolute: 0.1 10*3/uL (ref 0.0–0.5)
Eosinophils Relative: 1 %
HCT: 30.5 % — ABNORMAL LOW (ref 36.0–46.0)
Hemoglobin: 9.8 g/dL — ABNORMAL LOW (ref 12.0–15.0)
Immature Granulocytes: 1 %
Lymphocytes Relative: 7 %
Lymphs Abs: 1 10*3/uL (ref 0.7–4.0)
MCH: 32.8 pg (ref 26.0–34.0)
MCHC: 32.1 g/dL (ref 30.0–36.0)
MCV: 102 fL — ABNORMAL HIGH (ref 80.0–100.0)
Monocytes Absolute: 1.1 10*3/uL — ABNORMAL HIGH (ref 0.1–1.0)
Monocytes Relative: 7 %
Neutro Abs: 12.3 10*3/uL — ABNORMAL HIGH (ref 1.7–7.7)
Neutrophils Relative %: 83 %
Platelets: 219 10*3/uL (ref 150–400)
RBC: 2.99 MIL/uL — ABNORMAL LOW (ref 3.87–5.11)
RDW: 13.2 % (ref 11.5–15.5)
WBC: 14.8 10*3/uL — ABNORMAL HIGH (ref 4.0–10.5)
nRBC: 0 % (ref 0.0–0.2)

## 2018-12-29 ENCOUNTER — Encounter
Admission: RE | Admit: 2018-12-29 | Discharge: 2018-12-29 | Disposition: A | Discharge status: Home / Self Care | Payer: Medicare Other | Source: Ambulatory Visit | Attending: Internal Medicine | Admitting: Internal Medicine

## 2018-12-31 DIAGNOSIS — Z20828 Contact with and (suspected) exposure to other viral communicable diseases: Secondary | ICD-10-CM | POA: Diagnosis not present

## 2019-01-14 DIAGNOSIS — Z20828 Contact with and (suspected) exposure to other viral communicable diseases: Secondary | ICD-10-CM | POA: Diagnosis not present

## 2019-01-23 NOTE — Telephone Encounter (Signed)
err

## 2019-01-27 ENCOUNTER — Encounter
Admission: RE | Admit: 2019-01-27 | Discharge: 2019-01-27 | Disposition: A | Discharge status: Home / Self Care | Payer: Medicare Other | Source: Ambulatory Visit | Attending: Internal Medicine | Admitting: Internal Medicine

## 2019-02-11 DIAGNOSIS — Z0000 Encounter for general adult medical examination without abnormal findings: Secondary | ICD-10-CM | POA: Diagnosis not present

## 2019-02-11 DIAGNOSIS — E034 Atrophy of thyroid (acquired): Secondary | ICD-10-CM | POA: Diagnosis not present

## 2019-02-11 DIAGNOSIS — Z789 Other specified health status: Secondary | ICD-10-CM | POA: Diagnosis not present

## 2019-02-11 DIAGNOSIS — I779 Disorder of arteries and arterioles, unspecified: Secondary | ICD-10-CM | POA: Diagnosis not present

## 2019-02-11 DIAGNOSIS — I10 Essential (primary) hypertension: Secondary | ICD-10-CM | POA: Diagnosis not present

## 2019-02-11 DIAGNOSIS — I70235 Atherosclerosis of native arteries of right leg with ulceration of other part of foot: Secondary | ICD-10-CM | POA: Diagnosis not present

## 2019-02-26 DEATH — deceased

## 2019-06-09 ENCOUNTER — Encounter (INDEPENDENT_AMBULATORY_CARE_PROVIDER_SITE_OTHER): Payer: Medicare Other

## 2019-06-09 ENCOUNTER — Ambulatory Visit (INDEPENDENT_AMBULATORY_CARE_PROVIDER_SITE_OTHER): Payer: Medicare Other | Admitting: Vascular Surgery
